# Patient Record
Sex: Male | Born: 1940 | Race: White | Hispanic: No | Marital: Married | Smoking: Former smoker
Health system: Southern US, Community
[De-identification: ages and names within clinical notes are randomized; demographics above are authoritative.]

## PROBLEM LIST (undated history)

## (undated) ENCOUNTER — Encounter: Attending: Ambulatory Care | Primary: Ambulatory Care

## (undated) ENCOUNTER — Telehealth: Attending: Ambulatory Care | Primary: Ambulatory Care

## (undated) ENCOUNTER — Encounter

## (undated) ENCOUNTER — Telehealth

## (undated) ENCOUNTER — Ambulatory Visit

## (undated) ENCOUNTER — Encounter: Attending: Internal Medicine | Primary: Internal Medicine

## (undated) ENCOUNTER — Encounter: Attending: Dermatology | Primary: Dermatology

## (undated) DIAGNOSIS — R972 Elevated prostate specific antigen [PSA]: Secondary | ICD-10-CM

## (undated) DIAGNOSIS — R079 Chest pain, unspecified: Secondary | ICD-10-CM

## (undated) DIAGNOSIS — Z95828 Presence of other vascular implants and grafts: Secondary | ICD-10-CM

## (undated) DIAGNOSIS — E119 Type 2 diabetes mellitus without complications: Secondary | ICD-10-CM

## (undated) DIAGNOSIS — R350 Frequency of micturition: Secondary | ICD-10-CM

## (undated) DIAGNOSIS — E790 Hyperuricemia without signs of inflammatory arthritis and tophaceous disease: Secondary | ICD-10-CM

## (undated) DIAGNOSIS — K5790 Diverticulosis of intestine, part unspecified, without perforation or abscess without bleeding: Secondary | ICD-10-CM

## (undated) DIAGNOSIS — K219 Gastro-esophageal reflux disease without esophagitis: Secondary | ICD-10-CM

## (undated) DIAGNOSIS — J449 Chronic obstructive pulmonary disease, unspecified: Secondary | ICD-10-CM

## (undated) DIAGNOSIS — I639 Cerebral infarction, unspecified: Secondary | ICD-10-CM

## (undated) DIAGNOSIS — L409 Psoriasis, unspecified: Secondary | ICD-10-CM

## (undated) DIAGNOSIS — I77810 Thoracic aortic ectasia: Secondary | ICD-10-CM

## (undated) DIAGNOSIS — J986 Disorders of diaphragm: Secondary | ICD-10-CM

## (undated) DIAGNOSIS — I6789 Other cerebrovascular disease: Secondary | ICD-10-CM

## (undated) DIAGNOSIS — I700 Atherosclerosis of aorta: Secondary | ICD-10-CM

## (undated) DIAGNOSIS — I10 Essential (primary) hypertension: Secondary | ICD-10-CM

## (undated) DIAGNOSIS — Z7960 Long term (current) use of unspecified immunomodulators and immunosuppressants: Secondary | ICD-10-CM

## (undated) DIAGNOSIS — K922 Gastrointestinal hemorrhage, unspecified: Secondary | ICD-10-CM

## (undated) DIAGNOSIS — I351 Nonrheumatic aortic (valve) insufficiency: Secondary | ICD-10-CM

## (undated) DIAGNOSIS — I5189 Other ill-defined heart diseases: Secondary | ICD-10-CM

## (undated) DIAGNOSIS — R339 Retention of urine, unspecified: Secondary | ICD-10-CM

## (undated) DIAGNOSIS — K55059 Acute (reversible) ischemia of intestine, part and extent unspecified: Secondary | ICD-10-CM

## (undated) DIAGNOSIS — E785 Hyperlipidemia, unspecified: Secondary | ICD-10-CM

## (undated) DIAGNOSIS — Z8619 Personal history of other infectious and parasitic diseases: Secondary | ICD-10-CM

## (undated) DIAGNOSIS — K2970 Gastritis, unspecified, without bleeding: Secondary | ICD-10-CM

## (undated) DIAGNOSIS — Z87898 Personal history of other specified conditions: Secondary | ICD-10-CM

## (undated) DIAGNOSIS — Z9889 Other specified postprocedural states: Secondary | ICD-10-CM

## (undated) DIAGNOSIS — N138 Other obstructive and reflux uropathy: Secondary | ICD-10-CM

## (undated) DIAGNOSIS — L4050 Arthropathic psoriasis, unspecified: Secondary | ICD-10-CM

## (undated) DIAGNOSIS — N401 Enlarged prostate with lower urinary tract symptoms: Secondary | ICD-10-CM

## (undated) DIAGNOSIS — H3530 Unspecified macular degeneration: Secondary | ICD-10-CM

## (undated) DIAGNOSIS — I272 Pulmonary hypertension, unspecified: Secondary | ICD-10-CM

## (undated) DIAGNOSIS — F32A Depression, unspecified: Secondary | ICD-10-CM

## (undated) DIAGNOSIS — K635 Polyp of colon: Secondary | ICD-10-CM

## (undated) DIAGNOSIS — M169 Osteoarthritis of hip, unspecified: Secondary | ICD-10-CM

## (undated) DIAGNOSIS — Z8546 Personal history of malignant neoplasm of prostate: Secondary | ICD-10-CM

## (undated) DIAGNOSIS — Z8719 Personal history of other diseases of the digestive system: Secondary | ICD-10-CM

## (undated) DIAGNOSIS — R739 Hyperglycemia, unspecified: Secondary | ICD-10-CM

## (undated) DIAGNOSIS — Z79899 Other long term (current) drug therapy: Secondary | ICD-10-CM

## (undated) DIAGNOSIS — D649 Anemia, unspecified: Secondary | ICD-10-CM

## (undated) DIAGNOSIS — N400 Enlarged prostate without lower urinary tract symptoms: Secondary | ICD-10-CM

## (undated) DIAGNOSIS — R0600 Dyspnea, unspecified: Secondary | ICD-10-CM

## (undated) DIAGNOSIS — R0609 Other forms of dyspnea: Secondary | ICD-10-CM

## (undated) DIAGNOSIS — M109 Gout, unspecified: Secondary | ICD-10-CM

## (undated) DIAGNOSIS — Z9841 Cataract extraction status, right eye: Secondary | ICD-10-CM

## (undated) DIAGNOSIS — G4700 Insomnia, unspecified: Secondary | ICD-10-CM

## (undated) DIAGNOSIS — M1612 Unilateral primary osteoarthritis, left hip: Secondary | ICD-10-CM

## (undated) DIAGNOSIS — E669 Obesity, unspecified: Secondary | ICD-10-CM

## (undated) HISTORY — DX: Chest pain, unspecified: R07.9

## (undated) HISTORY — DX: Hyperglycemia, unspecified: R73.9

## (undated) HISTORY — DX: Psoriasis, unspecified: L40.9

## (undated) HISTORY — DX: Essential (primary) hypertension: I10

## (undated) HISTORY — DX: Gastrointestinal hemorrhage, unspecified: K92.2

## (undated) HISTORY — DX: Hyperlipidemia, unspecified: E78.5

## (undated) HISTORY — PX: HERNIA REPAIR: SHX51

## (undated) HISTORY — DX: Gastro-esophageal reflux disease without esophagitis: K21.9

## (undated) HISTORY — DX: Gout, unspecified: M10.9

## (undated) HISTORY — DX: Personal history of other infectious and parasitic diseases: Z86.19

## (undated) HISTORY — DX: Obesity, unspecified: E66.9

## (undated) HISTORY — DX: Retention of urine, unspecified: R33.9

## (undated) HISTORY — DX: Frequency of micturition: R35.0

## (undated) HISTORY — DX: Personal history of other specified conditions: Z87.898

## (undated) HISTORY — PX: CATARACT EXTRACTION, BILATERAL: SHX1313

## (undated) HISTORY — DX: Other obstructive and reflux uropathy: N13.8

## (undated) HISTORY — DX: Anemia, unspecified: D64.9

## (undated) HISTORY — DX: Nonrheumatic aortic (valve) insufficiency: I35.1

## (undated) HISTORY — DX: Other obstructive and reflux uropathy: N40.1

## (undated) HISTORY — PX: BACK SURGERY: SHX140

## (undated) HISTORY — PX: EYE SURGERY: SHX253

## (undated) HISTORY — DX: Other long term (current) drug therapy: Z79.899

## (undated) HISTORY — DX: Hyperuricemia without signs of inflammatory arthritis and tophaceous disease: E79.0

## (undated) HISTORY — PX: COLONOSCOPY: SHX174

## (undated) HISTORY — PX: APPENDECTOMY: SHX54

## (undated) HISTORY — PX: SURGERY SCROTAL / TESTICULAR: SUR1316

## (undated) MED ORDER — MELATONIN 10 MG TABLET: ORAL | 0 days

---

## 1996-07-27 DIAGNOSIS — K56609 Unspecified intestinal obstruction, unspecified as to partial versus complete obstruction: Secondary | ICD-10-CM

## 1996-07-27 HISTORY — PX: PARTIAL COLECTOMY: SHX5273

## 1996-07-27 HISTORY — PX: COLECTOMY: SHX59

## 1996-07-27 HISTORY — DX: Unspecified intestinal obstruction, unspecified as to partial versus complete obstruction: K56.609

## 2005-03-18 ENCOUNTER — Ambulatory Visit: Payer: Self-pay | Admitting: Family Medicine

## 2005-03-26 ENCOUNTER — Ambulatory Visit (HOSPITAL_COMMUNITY): Admission: RE | Admit: 2005-03-26 | Discharge: 2005-03-26 | Payer: Self-pay | Admitting: Neurosurgery

## 2005-04-29 ENCOUNTER — Inpatient Hospital Stay (HOSPITAL_COMMUNITY): Admission: RE | Admit: 2005-04-29 | Discharge: 2005-04-30 | Payer: Self-pay | Admitting: Neurosurgery

## 2005-07-27 HISTORY — PX: CERVICAL SPINE SURGERY: SHX589

## 2005-11-12 ENCOUNTER — Ambulatory Visit: Payer: Self-pay | Admitting: General Surgery

## 2008-11-16 ENCOUNTER — Ambulatory Visit: Payer: Self-pay | Admitting: Family Medicine

## 2009-10-25 ENCOUNTER — Ambulatory Visit: Payer: Self-pay | Admitting: Internal Medicine

## 2009-11-20 ENCOUNTER — Inpatient Hospital Stay: Payer: Self-pay | Admitting: Internal Medicine

## 2009-11-24 ENCOUNTER — Ambulatory Visit: Payer: Self-pay | Admitting: Internal Medicine

## 2011-07-28 HISTORY — PX: PROSTATE BIOPSY: SHX241

## 2012-04-07 ENCOUNTER — Inpatient Hospital Stay: Payer: Self-pay | Admitting: Internal Medicine

## 2012-04-07 LAB — COMPREHENSIVE METABOLIC PANEL
Albumin: 3.5 g/dL (ref 3.4–5.0)
Alkaline Phosphatase: 42 U/L — ABNORMAL LOW (ref 50–136)
Anion Gap: 12 (ref 7–16)
BUN: 15 mg/dL (ref 7–18)
Bilirubin,Total: 0.2 mg/dL (ref 0.2–1.0)
Calcium, Total: 8.3 mg/dL — ABNORMAL LOW (ref 8.5–10.1)
Chloride: 101 mmol/L (ref 98–107)
Co2: 24 mmol/L (ref 21–32)
Creatinine: 0.96 mg/dL (ref 0.60–1.30)
EGFR (African American): >60
EGFR (Non-African Amer.): >60
Glucose: 141 mg/dL — ABNORMAL HIGH (ref 65–99)
Osmolality: 277 (ref 275–301)
Potassium: 3.1 mmol/L — ABNORMAL LOW (ref 3.5–5.1)
SGOT(AST): 18 U/L (ref 15–37)
SGPT (ALT): 14 U/L (ref 12–78)
Sodium: 137 mmol/L (ref 136–145)
Total Protein: 6.4 g/dL (ref 6.4–8.2)

## 2012-04-07 LAB — URINALYSIS, COMPLETE
Bacteria: NONE SEEN
Bilirubin,UR: NEGATIVE
Blood: NEGATIVE
Glucose,UR: NEGATIVE mg/dL (ref 0–75)
Hyaline Cast: 4
Ketone: NEGATIVE
Leukocyte Esterase: NEGATIVE
Nitrite: NEGATIVE
Ph: 5 (ref 4.5–8.0)
Protein: NEGATIVE
RBC,UR: <1 /HPF (ref 0–5)
Specific Gravity: 1.014 (ref 1.003–1.030)
Squamous Epithelial: NONE SEEN
WBC UR: 1 /HPF (ref 0–5)

## 2012-04-07 LAB — TROPONIN I: Troponin-I: <0.02 ng/mL

## 2012-04-07 LAB — CBC
HCT: 20.7 % — ABNORMAL LOW (ref 40.0–52.0)
HGB: 7.1 g/dL — ABNORMAL LOW (ref 13.0–18.0)
MCH: 32.9 pg (ref 26.0–34.0)
MCHC: 34.5 g/dL (ref 32.0–36.0)
MCV: 95 fL (ref 80–100)
Platelet: 221 10*3/uL (ref 150–440)
RBC: 2.17 10*6/uL — ABNORMAL LOW (ref 4.40–5.90)
RDW: 12.9 % (ref 11.5–14.5)
WBC: 6.5 10*3/uL (ref 3.8–10.6)

## 2012-04-07 LAB — CK TOTAL AND CKMB (NOT AT ARMC)
CK, Total: 81 U/L (ref 35–232)
CK-MB: 0.7 ng/mL (ref 0.5–3.6)

## 2012-04-07 LAB — APTT: Activated PTT: 25.8 secs (ref 23.6–35.9)

## 2012-04-07 LAB — LIPASE, BLOOD: Lipase: 137 U/L (ref 73–393)

## 2012-04-07 LAB — PROTIME-INR
INR: 1
Prothrombin Time: 13.3 secs (ref 11.5–14.7)

## 2012-04-07 LAB — MAGNESIUM: Magnesium: 1.9 mg/dL

## 2012-04-08 LAB — BASIC METABOLIC PANEL
Anion Gap: 7 (ref 7–16)
BUN: 9 mg/dL (ref 7–18)
Calcium, Total: 7.6 mg/dL — ABNORMAL LOW (ref 8.5–10.1)
Chloride: 107 mmol/L (ref 98–107)
Co2: 26 mmol/L (ref 21–32)
Creatinine: 0.84 mg/dL (ref 0.60–1.30)
EGFR (African American): >60
EGFR (Non-African Amer.): >60
Glucose: 114 mg/dL — ABNORMAL HIGH (ref 65–99)
Osmolality: 279 (ref 275–301)
Potassium: 3.2 mmol/L — ABNORMAL LOW (ref 3.5–5.1)
Sodium: 140 mmol/L (ref 136–145)

## 2012-04-08 LAB — CBC WITH DIFFERENTIAL/PLATELET
Basophil #: 0 10*3/uL (ref 0.0–0.1)
Basophil #: 0 10*3/uL (ref 0.0–0.1)
Basophil #: 0 10*3/uL (ref 0.0–0.1)
Basophil %: 0.5 %
Basophil %: 0.6 %
Basophil %: 0.3 %
Eosinophil #: 0 10*3/uL (ref 0.0–0.7)
Eosinophil #: 0 10*3/uL (ref 0.0–0.7)
Eosinophil #: 0 10*3/uL (ref 0.0–0.7)
Eosinophil %: 0.1 %
Eosinophil %: 0.6 %
Eosinophil %: 0.4 %
HCT: 22.1 % — ABNORMAL LOW (ref 40.0–52.0)
HCT: 21.1 % — ABNORMAL LOW (ref 40.0–52.0)
HCT: 20.3 % — ABNORMAL LOW (ref 40.0–52.0)
HGB: 7.8 g/dL — ABNORMAL LOW (ref 13.0–18.0)
HGB: 7.5 g/dL — ABNORMAL LOW (ref 13.0–18.0)
HGB: 7 g/dL — ABNORMAL LOW (ref 13.0–18.0)
Lymphocyte #: 1.3 10*3/uL (ref 1.0–3.6)
Lymphocyte #: 1.2 10*3/uL (ref 1.0–3.6)
Lymphocyte #: 1.5 10*3/uL (ref 1.0–3.6)
Lymphocyte %: 20.6 %
Lymphocyte %: 26 %
Lymphocyte %: 15 %
MCH: 32.3 pg (ref 26.0–34.0)
MCH: 32.5 pg (ref 26.0–34.0)
MCH: 32.4 pg (ref 26.0–34.0)
MCHC: 35.4 g/dL (ref 32.0–36.0)
MCHC: 35.4 g/dL (ref 32.0–36.0)
MCHC: 34.3 g/dL (ref 32.0–36.0)
MCV: 91 fL (ref 80–100)
MCV: 92 fL (ref 80–100)
MCV: 94 fL (ref 80–100)
Monocyte #: 0.5 x10 3/mm (ref 0.2–1.0)
Monocyte #: 0.4 x10 3/mm (ref 0.2–1.0)
Monocyte #: 0.7 x10 3/mm (ref 0.2–1.0)
Monocyte %: 7.6 %
Monocyte %: 9.5 %
Monocyte %: 6.9 %
Neutrophil #: 4.4 10*3/uL (ref 1.4–6.5)
Neutrophil #: 3 10*3/uL (ref 1.4–6.5)
Neutrophil #: 7.8 10*3/uL — ABNORMAL HIGH (ref 1.4–6.5)
Neutrophil %: 71.2 %
Neutrophil %: 63.3 %
Neutrophil %: 77.4 %
Platelet: 155 10*3/uL (ref 150–440)
Platelet: 153 10*3/uL (ref 150–440)
Platelet: 165 10*3/uL (ref 150–440)
RBC: 2.42 10*6/uL — ABNORMAL LOW (ref 4.40–5.90)
RBC: 2.3 10*6/uL — ABNORMAL LOW (ref 4.40–5.90)
RBC: 2.15 10*6/uL — ABNORMAL LOW (ref 4.40–5.90)
RDW: 14 % (ref 11.5–14.5)
RDW: 14.4 % (ref 11.5–14.5)
RDW: 14.9 % — ABNORMAL HIGH (ref 11.5–14.5)
WBC: 6.1 10*3/uL (ref 3.8–10.6)
WBC: 4.7 10*3/uL (ref 3.8–10.6)
WBC: 10 10*3/uL (ref 3.8–10.6)

## 2012-04-08 LAB — COMPREHENSIVE METABOLIC PANEL
Albumin: 2.8 g/dL — ABNORMAL LOW (ref 3.4–5.0)
Alkaline Phosphatase: 34 U/L — ABNORMAL LOW (ref 50–136)
Anion Gap: 11 (ref 7–16)
BUN: 11 mg/dL (ref 7–18)
Bilirubin,Total: 0.6 mg/dL (ref 0.2–1.0)
Calcium, Total: 7.6 mg/dL — ABNORMAL LOW (ref 8.5–10.1)
Chloride: 104 mmol/L (ref 98–107)
Co2: 23 mmol/L (ref 21–32)
Creatinine: 0.8 mg/dL (ref 0.60–1.30)
EGFR (African American): >60
EGFR (Non-African Amer.): >60
Glucose: 122 mg/dL — ABNORMAL HIGH (ref 65–99)
Osmolality: 276 (ref 275–301)
Potassium: 3 mmol/L — ABNORMAL LOW (ref 3.5–5.1)
SGOT(AST): 16 U/L (ref 15–37)
SGPT (ALT): 13 U/L (ref 12–78)
Sodium: 138 mmol/L (ref 136–145)
Total Protein: 5.2 g/dL — ABNORMAL LOW (ref 6.4–8.2)

## 2012-04-08 LAB — POTASSIUM: Potassium: 3.7 mmol/L (ref 3.5–5.1)

## 2012-04-09 LAB — CBC WITH DIFFERENTIAL/PLATELET
Basophil #: 0 10*3/uL (ref 0.0–0.1)
Basophil #: 0 10*3/uL (ref 0.0–0.1)
Basophil %: 0.2 %
Basophil %: 0.1 %
Eosinophil #: 0 10*3/uL (ref 0.0–0.7)
Eosinophil #: 0 10*3/uL (ref 0.0–0.7)
Eosinophil %: 0 %
Eosinophil %: 0 %
HCT: 23.2 % — ABNORMAL LOW (ref 40.0–52.0)
HCT: 21.4 % — ABNORMAL LOW (ref 40.0–52.0)
HGB: 8.1 g/dL — ABNORMAL LOW (ref 13.0–18.0)
HGB: 7.3 g/dL — ABNORMAL LOW (ref 13.0–18.0)
Lymphocyte #: 0.8 10*3/uL — ABNORMAL LOW (ref 1.0–3.6)
Lymphocyte #: 1.1 10*3/uL (ref 1.0–3.6)
Lymphocyte %: 8.9 %
Lymphocyte %: 12.9 %
MCH: 30.8 pg (ref 26.0–34.0)
MCH: 30.3 pg (ref 26.0–34.0)
MCHC: 34.9 g/dL (ref 32.0–36.0)
MCHC: 34.1 g/dL (ref 32.0–36.0)
MCV: 88 fL (ref 80–100)
MCV: 89 fL (ref 80–100)
Monocyte #: 0.6 x10 3/mm (ref 0.2–1.0)
Monocyte #: 0.8 x10 3/mm (ref 0.2–1.0)
Monocyte %: 7.2 %
Monocyte %: 9.5 %
Neutrophil #: 7.2 10*3/uL — ABNORMAL HIGH (ref 1.4–6.5)
Neutrophil #: 6.9 10*3/uL — ABNORMAL HIGH (ref 1.4–6.5)
Neutrophil %: 83.7 %
Neutrophil %: 77.5 %
Platelet: 126 10*3/uL — ABNORMAL LOW (ref 150–440)
Platelet: 122 10*3/uL — ABNORMAL LOW (ref 150–440)
RBC: 2.63 10*6/uL — ABNORMAL LOW (ref 4.40–5.90)
RBC: 2.4 10*6/uL — ABNORMAL LOW (ref 4.40–5.90)
RDW: 17.2 % — ABNORMAL HIGH (ref 11.5–14.5)
RDW: 17.6 % — ABNORMAL HIGH (ref 11.5–14.5)
WBC: 8.6 10*3/uL (ref 3.8–10.6)
WBC: 8.9 10*3/uL (ref 3.8–10.6)

## 2012-04-09 LAB — BASIC METABOLIC PANEL
Anion Gap: 12 (ref 7–16)
BUN: 12 mg/dL (ref 7–18)
Calcium, Total: 7.2 mg/dL — ABNORMAL LOW (ref 8.5–10.1)
Chloride: 111 mmol/L — ABNORMAL HIGH (ref 98–107)
Co2: 18 mmol/L — ABNORMAL LOW (ref 21–32)
Creatinine: 0.92 mg/dL (ref 0.60–1.30)
EGFR (African American): >60
EGFR (Non-African Amer.): >60
Glucose: 138 mg/dL — ABNORMAL HIGH (ref 65–99)
Osmolality: 283 (ref 275–301)
Potassium: 3.6 mmol/L (ref 3.5–5.1)
Sodium: 141 mmol/L (ref 136–145)

## 2012-04-09 LAB — HEMOGLOBIN
HGB: 6.7 g/dL — ABNORMAL LOW (ref 13.0–18.0)
HGB: 7.2 g/dL — ABNORMAL LOW (ref 13.0–18.0)

## 2012-04-09 LAB — MAGNESIUM: Magnesium: 1.6 mg/dL — ABNORMAL LOW

## 2012-04-10 LAB — CBC WITH DIFFERENTIAL/PLATELET
Basophil #: 0 10*3/uL (ref 0.0–0.1)
Basophil %: 0.3 %
Eosinophil #: 0.1 10*3/uL (ref 0.0–0.7)
Eosinophil %: 1.2 %
HCT: 19.8 % — ABNORMAL LOW (ref 40.0–52.0)
HGB: 6.9 g/dL — ABNORMAL LOW (ref 13.0–18.0)
Lymphocyte #: 0.7 10*3/uL — ABNORMAL LOW (ref 1.0–3.6)
Lymphocyte %: 10.5 %
MCH: 31.1 pg (ref 26.0–34.0)
MCHC: 35 g/dL (ref 32.0–36.0)
MCV: 89 fL (ref 80–100)
Monocyte #: 0.5 x10 3/mm (ref 0.2–1.0)
Monocyte %: 6.9 %
Neutrophil #: 5.6 10*3/uL (ref 1.4–6.5)
Neutrophil %: 81.1 %
Platelet: 130 10*3/uL — ABNORMAL LOW (ref 150–440)
RBC: 2.22 10*6/uL — ABNORMAL LOW (ref 4.40–5.90)
RDW: 16.3 % — ABNORMAL HIGH (ref 11.5–14.5)
WBC: 6.9 10*3/uL (ref 3.8–10.6)

## 2012-04-10 LAB — BASIC METABOLIC PANEL
Anion Gap: 8 (ref 7–16)
BUN: 9 mg/dL (ref 7–18)
Calcium, Total: 7.1 mg/dL — ABNORMAL LOW (ref 8.5–10.1)
Chloride: 109 mmol/L — ABNORMAL HIGH (ref 98–107)
Co2: 25 mmol/L (ref 21–32)
Creatinine: 0.89 mg/dL (ref 0.60–1.30)
EGFR (African American): >60
EGFR (Non-African Amer.): >60
Glucose: 114 mg/dL — ABNORMAL HIGH (ref 65–99)
Osmolality: 283 (ref 275–301)
Potassium: 3.2 mmol/L — ABNORMAL LOW (ref 3.5–5.1)
Sodium: 142 mmol/L (ref 136–145)

## 2012-04-10 LAB — HEMOGLOBIN
HGB: 8.8 g/dL — ABNORMAL LOW (ref 13.0–18.0)
HGB: 8.3 g/dL — ABNORMAL LOW (ref 13.0–18.0)

## 2012-04-10 LAB — POTASSIUM: Potassium: 3.7 mmol/L (ref 3.5–5.1)

## 2012-04-11 LAB — HEMOGLOBIN A1C: Hemoglobin A1C: 4.8 % (ref 4.2–6.3)

## 2012-04-11 LAB — CBC WITH DIFFERENTIAL/PLATELET
Basophil #: 0 10*3/uL (ref 0.0–0.1)
Basophil %: 0.2 %
Eosinophil #: 0.1 10*3/uL (ref 0.0–0.7)
Eosinophil %: 1.4 %
HCT: 25.7 % — ABNORMAL LOW (ref 40.0–52.0)
HGB: 9 g/dL — ABNORMAL LOW (ref 13.0–18.0)
Lymphocyte #: 0.9 10*3/uL — ABNORMAL LOW (ref 1.0–3.6)
Lymphocyte %: 13.3 %
MCH: 31.4 pg (ref 26.0–34.0)
MCHC: 35.3 g/dL (ref 32.0–36.0)
MCV: 89 fL (ref 80–100)
Monocyte #: 0.4 x10 3/mm (ref 0.2–1.0)
Monocyte %: 6.2 %
Neutrophil #: 5.3 10*3/uL (ref 1.4–6.5)
Neutrophil %: 78.9 %
Platelet: 149 10*3/uL — ABNORMAL LOW (ref 150–440)
RBC: 2.88 10*6/uL — ABNORMAL LOW (ref 4.40–5.90)
RDW: 16.1 % — ABNORMAL HIGH (ref 11.5–14.5)
WBC: 6.7 10*3/uL (ref 3.8–10.6)

## 2012-04-11 LAB — BASIC METABOLIC PANEL
Anion Gap: 8 (ref 7–16)
BUN: 4 mg/dL — ABNORMAL LOW (ref 7–18)
Calcium, Total: 7.5 mg/dL — ABNORMAL LOW (ref 8.5–10.1)
Chloride: 108 mmol/L — ABNORMAL HIGH (ref 98–107)
Co2: 26 mmol/L (ref 21–32)
Creatinine: 0.79 mg/dL (ref 0.60–1.30)
EGFR (African American): >60
EGFR (Non-African Amer.): >60
Glucose: 107 mg/dL — ABNORMAL HIGH (ref 65–99)
Osmolality: 280 (ref 275–301)
Potassium: 3.3 mmol/L — ABNORMAL LOW (ref 3.5–5.1)
Sodium: 142 mmol/L (ref 136–145)

## 2012-04-11 LAB — MAGNESIUM: Magnesium: 1.8 mg/dL

## 2012-04-15 DIAGNOSIS — K922 Gastrointestinal hemorrhage, unspecified: Secondary | ICD-10-CM | POA: Insufficient documentation

## 2012-07-11 ENCOUNTER — Ambulatory Visit: Payer: Self-pay | Admitting: Unknown Physician Specialty

## 2012-07-12 LAB — PATHOLOGY REPORT

## 2013-08-07 ENCOUNTER — Ambulatory Visit: Payer: Self-pay | Admitting: Ophthalmology

## 2013-08-07 LAB — POTASSIUM: Potassium: 3.5 mmol/L (ref 3.5–5.1)

## 2013-08-14 ENCOUNTER — Ambulatory Visit: Payer: Self-pay | Admitting: Ophthalmology

## 2013-09-05 ENCOUNTER — Ambulatory Visit: Payer: Self-pay | Admitting: Ophthalmology

## 2013-09-05 LAB — POTASSIUM: Potassium: 3.4 mmol/L — ABNORMAL LOW (ref 3.5–5.1)

## 2013-09-11 ENCOUNTER — Ambulatory Visit: Payer: Self-pay | Admitting: Ophthalmology

## 2013-12-01 LAB — CBC
HCT: 39.7 % — ABNORMAL LOW (ref 40.0–52.0)
HGB: 13.1 g/dL (ref 13.0–18.0)
MCH: 31.9 pg (ref 26.0–34.0)
MCHC: 32.9 g/dL (ref 32.0–36.0)
MCV: 97 fL (ref 80–100)
Platelet: 224 10*3/uL (ref 150–440)
RBC: 4.11 10*6/uL — ABNORMAL LOW (ref 4.40–5.90)
RDW: 13.9 % (ref 11.5–14.5)
WBC: 6.4 10*3/uL (ref 3.8–10.6)

## 2013-12-01 LAB — COMPREHENSIVE METABOLIC PANEL
Albumin: 3.6 g/dL (ref 3.4–5.0)
Alkaline Phosphatase: 64 U/L
Anion Gap: 7 (ref 7–16)
BUN: 15 mg/dL (ref 7–18)
Bilirubin,Total: 0.2 mg/dL (ref 0.2–1.0)
Calcium, Total: 8.9 mg/dL (ref 8.5–10.1)
Chloride: 105 mmol/L (ref 98–107)
Co2: 27 mmol/L (ref 21–32)
Creatinine: 1.04 mg/dL (ref 0.60–1.30)
EGFR (African American): >60
EGFR (Non-African Amer.): >60
Glucose: 165 mg/dL — ABNORMAL HIGH (ref 65–99)
Osmolality: 282 (ref 275–301)
Potassium: 3.4 mmol/L — ABNORMAL LOW (ref 3.5–5.1)
SGOT(AST): 25 U/L (ref 15–37)
SGPT (ALT): 21 U/L (ref 12–78)
Sodium: 139 mmol/L (ref 136–145)
Total Protein: 7.3 g/dL (ref 6.4–8.2)

## 2013-12-01 LAB — APTT: Activated PTT: 35.4 secs (ref 23.6–35.9)

## 2013-12-01 LAB — PROTIME-INR
INR: 1
Prothrombin Time: 13.4 secs (ref 11.5–14.7)

## 2013-12-01 LAB — LIPASE, BLOOD: Lipase: 188 U/L (ref 73–393)

## 2013-12-02 ENCOUNTER — Inpatient Hospital Stay: Payer: Self-pay | Admitting: Surgery

## 2013-12-02 LAB — CBC WITH DIFFERENTIAL/PLATELET
BASOS ABS: 0 10*3/uL (ref 0.0–0.1)
Basophil #: 0 10*3/uL (ref 0.0–0.1)
Basophil #: 0 10*3/uL (ref 0.0–0.1)
Basophil %: 0.5 %
Basophil %: 0.5 %
Basophil %: 0.5 %
Eosinophil #: 0.1 10*3/uL (ref 0.0–0.7)
Eosinophil #: 0.1 10*3/uL (ref 0.0–0.7)
Eosinophil #: 0.1 10*3/uL (ref 0.0–0.7)
Eosinophil %: 1.1 %
Eosinophil %: 1.8 %
Eosinophil %: 2.1 %
HCT: 37.3 % — ABNORMAL LOW (ref 40.0–52.0)
HCT: 32.4 % — ABNORMAL LOW (ref 40.0–52.0)
HCT: 32 % — ABNORMAL LOW (ref 40.0–52.0)
HGB: 12.1 g/dL — ABNORMAL LOW (ref 13.0–18.0)
HGB: 10.6 g/dL — ABNORMAL LOW (ref 13.0–18.0)
HGB: 10.5 g/dL — ABNORMAL LOW (ref 13.0–18.0)
LYMPHS ABS: 1 10*3/uL (ref 1.0–3.6)
Lymphocyte #: 1.1 10*3/uL (ref 1.0–3.6)
Lymphocyte #: 1 10*3/uL (ref 1.0–3.6)
Lymphocyte %: 15.7 %
Lymphocyte %: 22.7 %
Lymphocyte %: 18.2 %
MCH: 30.7 pg (ref 26.0–34.0)
MCH: 30.9 pg (ref 26.0–34.0)
MCH: 30.7 pg (ref 26.0–34.0)
MCHC: 32.4 g/dL (ref 32.0–36.0)
MCHC: 32.6 g/dL (ref 32.0–36.0)
MCHC: 32.8 g/dL (ref 32.0–36.0)
MCV: 95 fL (ref 80–100)
MCV: 95 fL (ref 80–100)
MCV: 94 fL (ref 80–100)
Monocyte #: 0.4 x10 3/mm (ref 0.2–1.0)
Monocyte #: 0.3 x10 3/mm (ref 0.2–1.0)
Monocyte #: 0.5 x10 3/mm (ref 0.2–1.0)
Monocyte %: 6.4 %
Monocyte %: 7.6 %
Monocyte %: 9 %
NEUTROS ABS: 3.7 10*3/uL (ref 1.4–6.5)
Neutrophil #: 5.3 10*3/uL (ref 1.4–6.5)
Neutrophil #: 2.8 10*3/uL (ref 1.4–6.5)
Neutrophil %: 76.3 %
Neutrophil %: 67.4 %
Neutrophil %: 70.2 %
PLATELETS: 159 10*3/uL (ref 150–440)
Platelet: 164 10*3/uL (ref 150–440)
Platelet: 145 10*3/uL — ABNORMAL LOW (ref 150–440)
RBC: 3.94 10*6/uL — ABNORMAL LOW (ref 4.40–5.90)
RBC: 3.43 10*6/uL — ABNORMAL LOW (ref 4.40–5.90)
RBC: 3.43 10*6/uL — AB (ref 4.40–5.90)
RDW: 15.4 % — ABNORMAL HIGH (ref 11.5–14.5)
RDW: 15.6 % — ABNORMAL HIGH (ref 11.5–14.5)
RDW: 15.6 % — ABNORMAL HIGH (ref 11.5–14.5)
WBC: 7 10*3/uL (ref 3.8–10.6)
WBC: 4.2 10*3/uL (ref 3.8–10.6)
WBC: 5.3 10*3/uL (ref 3.8–10.6)

## 2013-12-02 LAB — BASIC METABOLIC PANEL
Anion Gap: 3 — ABNORMAL LOW (ref 7–16)
BUN: 12 mg/dL (ref 7–18)
Calcium, Total: 7.9 mg/dL — ABNORMAL LOW (ref 8.5–10.1)
Chloride: 110 mmol/L — ABNORMAL HIGH (ref 98–107)
Co2: 28 mmol/L (ref 21–32)
Creatinine: 0.88 mg/dL (ref 0.60–1.30)
EGFR (African American): >60
EGFR (Non-African Amer.): >60
Glucose: 172 mg/dL — ABNORMAL HIGH (ref 65–99)
Osmolality: 285 (ref 275–301)
Potassium: 4 mmol/L (ref 3.5–5.1)
Sodium: 141 mmol/L (ref 136–145)

## 2013-12-02 LAB — HEMOGLOBIN: HGB: 9.4 g/dL — ABNORMAL LOW (ref 13.0–18.0)

## 2013-12-03 LAB — CBC WITH DIFFERENTIAL/PLATELET
BASOS ABS: 0 10*3/uL (ref 0.0–0.1)
BASOS PCT: 0.4 %
Basophil #: 0 10*3/uL (ref 0.0–0.1)
Basophil %: 0.4 %
EOS ABS: 0.1 10*3/uL (ref 0.0–0.7)
EOS ABS: 0.1 10*3/uL (ref 0.0–0.7)
EOS PCT: 1.7 %
Eosinophil %: 2.6 %
HCT: 24.4 % — ABNORMAL LOW (ref 40.0–52.0)
HCT: 22.5 % — AB (ref 40.0–52.0)
HGB: 8.2 g/dL — AB (ref 13.0–18.0)
HGB: 7.5 g/dL — AB (ref 13.0–18.0)
LYMPHS PCT: 24.3 %
Lymphocyte #: 1.3 10*3/uL (ref 1.0–3.6)
Lymphocyte #: 1.2 10*3/uL (ref 1.0–3.6)
Lymphocyte %: 22.3 %
MCH: 31.6 pg (ref 26.0–34.0)
MCH: 31.5 pg (ref 26.0–34.0)
MCHC: 33.5 g/dL (ref 32.0–36.0)
MCHC: 33.2 g/dL (ref 32.0–36.0)
MCV: 94 fL (ref 80–100)
MCV: 95 fL (ref 80–100)
MONO ABS: 0.5 x10 3/mm (ref 0.2–1.0)
MONOS PCT: 8.9 %
Monocyte #: 0.4 x10 3/mm (ref 0.2–1.0)
Monocyte %: 7.3 %
NEUTROS ABS: 3.6 10*3/uL (ref 1.4–6.5)
Neutrophil #: 3.3 10*3/uL (ref 1.4–6.5)
Neutrophil %: 64.7 %
Neutrophil %: 67.4 %
PLATELETS: 162 10*3/uL (ref 150–440)
PLATELETS: 159 10*3/uL (ref 150–440)
RBC: 2.59 10*6/uL — ABNORMAL LOW (ref 4.40–5.90)
RBC: 2.37 10*6/uL — ABNORMAL LOW (ref 4.40–5.90)
RDW: 15.2 % — ABNORMAL HIGH (ref 11.5–14.5)
RDW: 15.1 % — ABNORMAL HIGH (ref 11.5–14.5)
WBC: 5.2 10*3/uL (ref 3.8–10.6)
WBC: 5.4 10*3/uL (ref 3.8–10.6)

## 2013-12-03 LAB — HEMOGLOBIN: HGB: 8.6 g/dL — ABNORMAL LOW (ref 13.0–18.0)

## 2013-12-04 LAB — CBC WITH DIFFERENTIAL/PLATELET
BASOS PCT: 0.5 %
Basophil #: 0 10*3/uL (ref 0.0–0.1)
EOS PCT: 3.5 %
Eosinophil #: 0.2 10*3/uL (ref 0.0–0.7)
HCT: 16.1 % — AB (ref 40.0–52.0)
HGB: 5.5 g/dL — ABNORMAL LOW (ref 13.0–18.0)
Lymphocyte #: 1.2 10*3/uL (ref 1.0–3.6)
Lymphocyte %: 25.1 %
MCH: 32 pg (ref 26.0–34.0)
MCHC: 34 g/dL (ref 32.0–36.0)
MCV: 94 fL (ref 80–100)
Monocyte #: 0.5 x10 3/mm (ref 0.2–1.0)
Monocyte %: 9.8 %
NEUTROS ABS: 2.9 10*3/uL (ref 1.4–6.5)
Neutrophil %: 61.1 %
PLATELETS: 146 10*3/uL — AB (ref 150–440)
RBC: 1.71 10*6/uL — AB (ref 4.40–5.90)
RDW: 15.3 % — AB (ref 11.5–14.5)
WBC: 4.8 10*3/uL (ref 3.8–10.6)

## 2013-12-05 LAB — CBC WITH DIFFERENTIAL/PLATELET
Basophil #: 0 10*3/uL (ref 0.0–0.1)
Basophil %: 0.3 %
EOS ABS: 0.2 10*3/uL (ref 0.0–0.7)
EOS PCT: 4.4 %
HCT: 20.8 % — AB (ref 40.0–52.0)
HGB: 7.2 g/dL — ABNORMAL LOW (ref 13.0–18.0)
LYMPHS ABS: 1 10*3/uL (ref 1.0–3.6)
Lymphocyte %: 17.5 %
MCH: 31.3 pg (ref 26.0–34.0)
MCHC: 34.5 g/dL (ref 32.0–36.0)
MCV: 91 fL (ref 80–100)
Monocyte #: 0.5 x10 3/mm (ref 0.2–1.0)
Monocyte %: 8.6 %
Neutrophil #: 3.9 10*3/uL (ref 1.4–6.5)
Neutrophil %: 69.2 %
PLATELETS: 149 10*3/uL — AB (ref 150–440)
RBC: 2.29 10*6/uL — ABNORMAL LOW (ref 4.40–5.90)
RDW: 15.9 % — AB (ref 11.5–14.5)
WBC: 5.7 10*3/uL (ref 3.8–10.6)

## 2013-12-05 LAB — HEMOGLOBIN: HGB: 7.6 g/dL — ABNORMAL LOW (ref 13.0–18.0)

## 2013-12-06 LAB — CBC WITH DIFFERENTIAL/PLATELET
BASOS ABS: 0 10*3/uL (ref 0.0–0.1)
BASOS PCT: 0.3 %
EOS PCT: 4.9 %
Eosinophil #: 0.3 10*3/uL (ref 0.0–0.7)
HCT: 21.6 % — ABNORMAL LOW (ref 40.0–52.0)
HGB: 7.3 g/dL — AB (ref 13.0–18.0)
LYMPHS PCT: 19.6 %
Lymphocyte #: 1.1 10*3/uL (ref 1.0–3.6)
MCH: 31.3 pg (ref 26.0–34.0)
MCHC: 33.9 g/dL (ref 32.0–36.0)
MCV: 92 fL (ref 80–100)
Monocyte #: 0.5 x10 3/mm (ref 0.2–1.0)
Monocyte %: 9.4 %
Neutrophil #: 3.8 10*3/uL (ref 1.4–6.5)
Neutrophil %: 65.8 %
Platelet: 188 10*3/uL (ref 150–440)
RBC: 2.34 10*6/uL — AB (ref 4.40–5.90)
RDW: 15.8 % — ABNORMAL HIGH (ref 11.5–14.5)
WBC: 5.8 10*3/uL (ref 3.8–10.6)

## 2013-12-08 LAB — PATHOLOGY REPORT

## 2014-01-02 DIAGNOSIS — N400 Enlarged prostate without lower urinary tract symptoms: Secondary | ICD-10-CM | POA: Insufficient documentation

## 2014-01-02 DIAGNOSIS — E790 Hyperuricemia without signs of inflammatory arthritis and tophaceous disease: Secondary | ICD-10-CM | POA: Insufficient documentation

## 2014-01-02 DIAGNOSIS — D649 Anemia, unspecified: Secondary | ICD-10-CM | POA: Insufficient documentation

## 2014-01-02 DIAGNOSIS — R972 Elevated prostate specific antigen [PSA]: Secondary | ICD-10-CM | POA: Insufficient documentation

## 2014-05-25 ENCOUNTER — Ambulatory Visit: Payer: Self-pay

## 2014-07-16 ENCOUNTER — Ambulatory Visit: Payer: Self-pay | Admitting: Specialist

## 2014-11-13 NOTE — Consult Note (Signed)
[place]:VJKQA GI bleed. Pt not bleeding any since admission to CCU.  and hgb up to 7.8 after 2 units.  He had a bleeding scan last night but results pending.  Chest clear, heart RRR, abd soft and non tender.  Likely can go to floor.  Repeat hgb or CBC this afternoon and if below current level transfuse one more unit.  Likely LGI bleed and likely diverticulosis.  Dr. Dionne Milo on over weekend and will see both days.  Can start clear liquids tomorrow morning, water today and ice chips.  Electronic Signatures: [name] (MD)  (Signed on 13-Sep-13 08:26)  Authored  Last Updated: 13-Sep-13 08:26 by [name] (MD)

## 2014-11-13 NOTE — H&P (Signed)
PATIENT NAME:  Brian Callahan, Brian Callahan MR#:  [id number] DATE OF BIRTH:  12/22/1940  DATE OF ADMISSION:  04/07/2012  PRIMARY CARE PHYSICIAN: Gayland Curry, MD  ER PHYSICIAN: Arman Filter, MD  CHIEF COMPLAINT: Rectal bleeding.   HISTORY OF PRESENT ILLNESS: This is a 74 year old male patient who came to the ER because he is having bright red blood from the rectum since Sunday. The patient noticed fresh blood coming out since Sunday, on and off. The patient had about 4 to 5 episodes of bright red blood from the rectum today and then he came to the ER. In the ER, he was found to have a hemoglobin of 7.1. We were asked to admit the patient for acute GI bleed. The patient complains of nausea and some dizziness but no chest pain and no abdominal pain.  PAST MEDICAL HISTORY:  1. Hypertension. 2. Hyperlipidemia.   ALLERGIES: No known allergies.   MEDICATIONS:  1. Aspirin 81 mg daily. 2. Enalapril 10 mg daily. 3. HCTZ/metoprolol 25/50 mg one tablet daily.  4. Multivitamin 1 tablet daily.  5. Zetia 10 mg daily.   SOCIAL HISTORY: The patient does not smoke. 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 No drugs.   PAST SURGICAL HISTORY: 1. Bowel resection for diverticulitis. 2. Appendectomy.  3. Inguinal hernia repair. 4. Cervical fusion.  FAMILY HISTORY: Father and brothers have diabetes.  REVIEW OF SYSTEMS: CONSTITUTIONAL: The patient has no fever but has fatigue and pale appearance. EYES: No blurred vision. ENT: No tinnitus. No epistaxis. No difficulty swallowing. RESPIRATORY: No cough. No wheezing. CARDIOVASCULAR: No chest pain. No palpitations. GI: Has nausea and rectal bleeding. GENITOURINARY: No dysuria. ENDOCRINE: No polyuria or nocturia. INTEGUMENTARY: No skin rashes. MUSCULOSKELETAL: No joint pain. NEUROLOGIC: No numbness or weakness. PSYCH: No anxiety or insomnia.   PHYSICAL EXAMINATION:   VITALS: Temperature 99, pulse 102, respirations 18 to 20, and blood pressure initially  96/51 and repeat 130/58.  GENERAL: Alert, awake and oriented, very pale appearing male, not in distress.   HEAD/EYES: Head atraumatic, normocephalic. Pupils are equally reacting to light. Extraocular movements are intact.   ENT: No tympanic membrane congestion. No hypertrophy. No oropharyngeal erythema.   NECK: Thyroid not enlarged. No lymphadenopathy. No carotid bruit.   CARDIOVASCULAR: S1 and S2 regular. No murmurs.   LUNGS: Clear to auscultation. No wheeze. No rales.   ABDOMEN: Scar in the midabdomen. Bowel sounds present. No tenderness. No hernias.  MUSCULOSKELETAL: Strength 5 out of 5 in upper and lower extremities. Sensation intact.   SKIN: Pale.   NEUROLOGIC: Oriented to time, place, and person. No focal neurological deficits.   LABS/STUDIES: Electrolytes: Sodium 137, potassium 3.1, chloride 101, bicarbonate 24, BUN 15, creatinine 0.96, and glucose 141.   White count 6.5, hemoglobin 7.1, hematocrit 20.7, and platelets 221.   EKG shows normal sinus with PVCs, 94 beats, no ST-T changes.   ASSESSMENT AND PLAN:  1. The patient is a 74 year old male with rectal bleeding, most likely lower, diverticular versus polyp bleeding. Hemoglobin is 7.1. Recommend blood transfusion. I spoke with Dr. Vira Agar. The patient is scheduled for bleeding scan. The patient is refusing blood transfusion but told that he has been having active bleeding and hemoglobin is low and transfusion is indicated. We will continue IV fluids, admit to the Intensive Care Unit, transfuse, and check CBC every six hours. Hold on aspirin and other anticoagulants.  Obtain gastroenterology consultation, Dr. Vira Agar notified. Continue n.p.o., IV fluids, and IV PPIs. After the bleeding scan, if  it is positive, we will have vascular surgery see the patient.  2. Hypertension. Blood pressure is borderline. We will hold the blood pressure medications.   I discussed the plan with the patient and the patient's wife at bedside.    TIME SPENT: About 60 minutes on critical history and physical.  ____________________________ Epifanio Lesches, MD sk:slb D: 04/07/2012 21:44:58 ET T: 04/08/2012 07:42:59 ET JOB#: [id number]  cc: Epifanio Lesches, MD, <Dictator> Floria Raveling. Astrid Divine, MD Epifanio Lesches MD ELECTRONICALLY SIGNED 04/27/2012 22:34

## 2014-11-13 NOTE — Consult Note (Signed)
General Aspect GI Bleed    Present Illness The patient is a 74 year old male patient who came to the ER because he is having bright red blood from the rectum since last Sunday. The patient had about 4 to 5 episodes of bright red blood from the rectum today and then he came to the ER. In the ER, he was found to have a hemoglobin of 7.1. The bleeding is painless.  The patient complains of nausea and some dizziness but no chest pain and no abdominal pain.  Initially he was on the floor but then had a massive bloody movement and became hypotensive.   He has been transfered to the ICU and I am asked to evaluate for possible intervention.  PAST MEDICAL HISTORY:  1. Hypertension. 2. Hyperlipidemia.   Home Medications: Medication Instructions Status  aspirin 81 mg oral tablet 1 tab(s) orally once a day Active  enalapril 10 mg oral tablet 1 tab(s) orally once a day Active  hydrochlorothiazide-metoprolol 25 mg-50 mg oral tablet 1 tab(s) orally once a day Active  multivitamin 1 tab(s) orally once a day Active  Zetia 10 mg oral tablet 1 tab(s) orally once a day Active    No Known Allergies:   Case History:   Family History Non-Contributory    Social History negative tobacco, negative ETOH, negative Illicit drugs   Review of Systems:   Fever/Chills No    Cough No    Sputum No    Abdominal Pain No    Diarrhea Yes  bloody    Constipation No    Nausea/Vomiting No    SOB/DOE No    Chest Pain No    Telemetry Reviewed NSR    Dysuria No    Tolerating Diet No   Physical Exam:   GEN well developed, critically ill appearing    HEENT pale conjunctivae, PERRL, dry oral mucosa    NECK supple  trachea midline    RESP normal resp effort  no use of accessory muscles    CARD regular rate  no JVD    ABD denies tenderness  soft  nondistended    EXTR negative cyanosis/clubbing, negative edema    SKIN normal to palpation, No rashes, No ulcers    NEURO cranial nerves intact,  follows commands, motor/sensory function intact    PSYCH alert, A+O to time, place, person, good insight   Nursing/Ancillary Notes: **Vital Signs.:   13-Sep-13 15:05   Vital Signs Type Routine   Temperature Temperature (F) 98.3   Celsius 36.8   Temperature Source oral   Pulse Pulse 107   Pulse source if not from Vital Sign Device per cardiac monitor   Respirations Respirations 12   Systolic BP Systolic BP 102   Diastolic BP (mmHg) Diastolic BP (mmHg) 63   Mean BP 77   BP Source  if not from Vital Sign Device non-invasive   Pulse Ox % Pulse Ox % 97   Oxygen Delivery Room Air/ 21 %   Hepatic:  13-Sep-13 01:53    Bilirubin, Total 0.6   Alkaline Phosphatase  34   SGPT (ALT) 13   SGOT (AST) 16   Total Protein, Serum  5.2   Albumin, Serum  2.8  Routine Chem:  13-Sep-13 01:53    Potassium, Serum  3.0   Glucose, Serum  122   BUN 11   Creatinine (comp) 0.80   Sodium, Serum 138   Chloride, Serum 104   CO2, Serum 23   Calcium (Total),  Serum  7.6   Anion Gap 11   Osmolality (calc) 276   eGFR (African American) >60   eGFR (Non-African American) >60 (eGFR values <36m/min/1.73 m2 may be an indication of chronic kidney disease (CKD). Calculated eGFR is useful in patients with stable renal function. The eGFR calculation will not be reliable in acutely ill patients when serum creatinine is changing rapidly. It is not useful in  patients on dialysis. The eGFR calculation may not be applicable to patients at the low and high extremes of body sizes, pregnant women, and vegetarians.)    08:09    Potassium, Serum  3.2   Glucose, Serum  114   BUN 9   Creatinine (comp) 0.84   Sodium, Serum 140   Chloride, Serum 107   CO2, Serum 26   Calcium (Total), Serum  7.6   Anion Gap 7   Osmolality (calc) 279   eGFR (African American) >60   eGFR (Non-African American) >60 (eGFR values <687mmin/1.73 m2 may be an indication of chronic kidney disease (CKD). Calculated eGFR is useful in  patients with stable renal function. The eGFR calculation will not be reliable in acutely ill patients when serum creatinine is changing rapidly. It is not useful in  patients on dialysis. The eGFR calculation may not be applicable to patients at the low and high extremes of body sizes, pregnant women, and vegetarians.)    14:02    Potassium, Serum 3.7 (Result(s) reported on 08 Apr 2012 at 02:26PM.)  Routine Hem:  13-Sep-13 01:53    Hemoglobin (CBC)  7.8   WBC (CBC) 6.1   RBC (CBC)  2.42   Hematocrit (CBC)  22.1   Platelet Count (CBC) 155   MCV 91   MCH 32.3   MCHC 35.4   RDW 14.0   Neutrophil % 71.2   Lymphocyte % 20.6   Monocyte % 7.6   Eosinophil % 0.1   Basophil % 0.5   Neutrophil # 4.4   Lymphocyte # 1.3   Monocyte # 0.5   Eosinophil # 0.0   Basophil # 0.0 (Result(s) reported on 08 Apr 2012 at 03:32AM.)    08:09    Hemoglobin (CBC)  7.5   WBC (CBC) 4.7   RBC (CBC)  2.30   Hematocrit (CBC)  21.1   Platelet Count (CBC) 153   MCV 92   MCH 32.5   MCHC 35.4   RDW 14.4   Neutrophil % 63.3   Lymphocyte % 26.0   Monocyte % 9.5   Eosinophil % 0.6   Basophil % 0.6   Neutrophil # 3.0   Lymphocyte # 1.2   Monocyte # 0.4   Eosinophil # 0.0   Basophil # 0.0 (Result(s) reported on 08 Apr 2012 at 08:29AM.)    14:02    Hemoglobin (CBC)  7.0   WBC (CBC) 10.0   RBC (CBC)  2.15   Hematocrit (CBC)  20.3   Platelet Count (CBC) 165   MCV 94   MCH 32.4   MCHC 34.3   RDW  14.9   Neutrophil % 77.4   Lymphocyte % 15.0   Monocyte % 6.9   Eosinophil % 0.4   Basophil % 0.3   Neutrophil #  7.8   Lymphocyte # 1.5   Monocyte # 0.7   Eosinophil # 0.0   Basophil # 0.0 (Result(s) reported on 08 Apr 2012 at 02:23PM.)    22:58    Hemoglobin (CBC)  8.1   WBC (CBC) 8.6  RBC (CBC)  2.63   Hematocrit (CBC)  23.2   Platelet Count (CBC)  126   MCV 88   MCH 30.8   MCHC 34.9   RDW  17.2   Neutrophil % 83.7   Lymphocyte % 8.9   Monocyte % 7.2   Eosinophil % 0.0   Basophil %  0.2   Neutrophil #  7.2   Lymphocyte #  0.8   Monocyte # 0.6   Eosinophil # 0.0   Basophil # 0.0 (Result(s) reported on 09 Apr 2012 at 12:04AM.)     Impression 1.  Acute GI bleed           continue to rescusitate with blood           given the massive extent of the hemorrhage I will proceed with angiography and embolization           risks and benefits discussed patient agrees to proceed           maintain type and cross for 2 units at all times 2.  Hypertension            given current status hold antihypertensive medications 3.  Hyperlipidemia             will continue statin when taking po 4.  Cervical DJD              avoid NSAIDs 5.  GERD              give PPI IV    Plan level 5 consult   Electronic Signatures: Hortencia Pilar (MD)  (Signed 15-Sep-13 16:15)  Authored: General Aspect/Present Illness, Home Medications, Allergies, History and Physical Exam, Vital Signs, Labs, Impression/Plan   Last Updated: 15-Sep-13 16:15 by Hortencia Pilar (MD)

## 2014-11-13 NOTE — Consult Note (Signed)
PATIENT NAME:  Brian Callahan, MORRICAL MR#:  [id number] DATE OF BIRTH:  1941-02-07  DATE OF CONSULTATION:  04/07/2012  REFERRING PHYSICIAN:   CONSULTING PHYSICIAN:  [name], MD  HISTORY OF PRESENT ILLNESS: The patient is a 74 year old white male who was admitted for GI bleeding. He started bleeding Sunday, today is Thursday. He has had bleeding off and on since then. Today was the worst. He has had about 4 or 5 bloody passages. Initially it was somewhat dark, but most of the other times it has been red blood and was red blood today. He did have nausea and he vomited once. There was some yellowish liquid. He thought he smelled blood, but there was no blood in the vomitus.   PAST SURGICAL HISTORY:  1. Colon resection in the early 1990s for blockage, possibly a volvulus, possibly diverticular disease. 2. Cervical neck fusion three years ago.  3. Hernia surgeries. 4. Previous catheterization that was negative.  5. 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   PAST MEDICAL HISTORY:  1. Hypertension.  2. Hypertension.  3. Psoriasis treated with creams.  MEDICATIONS: Metoprolol and lisinopril.  REVIEW OF SYSTEMS: He denies any abdominal pain, denies any chest pain. No skipping or irregular heartbeats. No hematemesis. No asthma, wheezing, emphysema, chronic cough, or shortness of breath. No headaches. No dysuria or hematuria. He does have a history of hypertension.   HABITS: He quit smoking 20 years ago, smoked maybe a pack a week. Alcohol - he drinks 5 or 6 drinks a day, varies between hard liquor and beer.      PHYSICAL EXAMINATION:   GENERAL: A 74 year old white male who appears his stated.  HEENT: Sclerae anicteric. Conjunctivae pale. Tongue is pale   HEAD: Atraumatic. Trachea is in the midline.   CHEST: Clear.   HEART: No murmurs or gallops I can hear.   ABDOMEN: Old scars from previous surgery. Some faintly reddish splotches consistent with psoriasis.  No hepatosplenomegaly. No masses. No bruits. No significant tenderness.   EXTREMITIES: No edema.   SKIN: Warm and dry.   RECTAL: Performed by the ER physician.   EXTREMITIES: No edema.   SKIN: Warm and dry.   PSYCH: Mood and affect are alert, oriented. The patient is somewhat hard of hearing.  LABS/STUDIES: Glucose 141, BUN 15, creatinine 0.96, sodium 137, potassium 3.1, chloride 101, CO2 24, and calcium 8.3. Magnesium 1.9. Lipase 137. Total protein 6.4, albumin 3.5, total bilirubin 0.2, alkaline phosphatase 42, SGOT 18, and SGPT 14. CPK, MB, and troponins are negative. White count 6.5, hemoglobin 7.1, and platelet count 221. O-negative blood with negative antibody screen. Pro time 13.3. INR 1.   ASSESSMENT: Lower GI bleeding off and on for several days, probably diverticular bleeding would be the most likely, cannot rule out neoplasm.     RECOMMENDATIONS:  1. I recommended a blood transfusion. The patient refuses at this time. He did say that if his bleeding scan was positive that he would accept a transfusion, but at this time despite my recommendation and the hospitalist recommendation he refuses blood transfusion. 2. I doubt upper GI source, but to cover the possibility would recommend 40 mg of Protonix IV twice a day.  3. Stat GI bleeding scan. If this is positive, recommend surgical consult.  4. Given his history of alcohol would consider close observation, may need to be put on CIWA Protocol. I will follow with you. ____________________________ [name], MD rte:slb D: 04/07/2012 26:83:41 ET T: 04/08/2012  06:07:33 ET JOB#: [id number]  cc: [name], MD, <Dictator> Floria Raveling. Astrid Divine, MD [name] MD ELECTRONICALLY SIGNED 04/11/2012 16:08

## 2014-11-13 NOTE — Consult Note (Signed)
Pt IO:XBDZH GI bleeding.  His repeat bleeding scan was positive throughout the colon, indicating that the location is likely right colon/hepatic flexure.  He continues to pass blood.  Getting ready to have his fourth unit this evening.  Surgeon suggested platelets since he was on asprin and his platelets may not function as well as needed.  Dieulafoy, diverticular, AVM, neoplasm all possible.  Dr. Delana Meyer contacted and plans angiography procedure tonight.  Hospitalist surgeon on case concerned if surgery needed without localization that the patient will need a total colectomy. Hgb 7, down from admission despite 2 units given last night.  His color looks better than when he was moved to the unit.  Check CBC after this next unit.  Discussed with patient.  Electronic Signatures: [name] (MD)  (Signed on 13-Sep-13 17:55)  Authored  Last Updated: 13-Sep-13 17:55 by [name] (MD)

## 2014-11-13 NOTE — Discharge Summary (Signed)
PATIENT NAME:  Brian Callahan, Brian Callahan MR#:  [id number] DATE OF BIRTH:  1941-05-15  DATE OF ADMISSION:  04/07/2012 DATE OF DISCHARGE:  04/11/2012  PRIMARY CARE PHYSICIAN: Gayland Curry, MD    CONSULTATIONS:  1. GI, Dr. Vira Agar. 2. General Surgery, Dr. Rexene Edison. 3. Vascular Surgery, Dr. Delana Meyer.   DISCHARGE DIAGNOSES:  1. Lower gastrointestinal bleeding possibly due to diverticulosis.  2. Anemia.   3. Hypertension.  4. Hyperlipidemia.   PROCEDURE: Mesenteric embolization.   CONDITION: Stable.   CODE STATUS:  FULL CODE.     HOME MEDICATIONS:  1. Enalapril 10 mg p.o. daily.  2. HCTZ/metoprolol 25 mg/50 mg p.o. once daily.  3. Multivitamin day 1 tablet p.o. daily.  4. Zetia 10 mg p.o. daily.  5. Protonix 40 mg p.o. b.i.d.   NOTE: Stop aspirin 81 mg p.o. daily.   DIET: Low sodium diet.   ACTIVITY: As tolerated.   FOLLOW-UP CARE:   1. Follow up with PCP within 1 week.  2. Follow up with GI, Dr. Vira Agar, within 1 to 2 weeks.   REASON FOR ADMISSION: Rectal bleeding.   HOSPITAL COURSE: The patient is a 74 year old gentleman with a history of hypertension, hyperlipidemia, presented to the Emergency Department with bright red blood from rectum. The patient had 4 to 5 episodes of bright red blood from rectum and then came to the ED for further evaluation. The patient's hemoglobin was 7.1 in the ED, and he was admitted for acute gastrointestinal bleeding. For a detailed History and Physical examination, please refer to the admission note dictated by Dr. Vianne Bulls. The patient was admitted to the Critical Care Unit and recently the patient got a blood transfusion, 2 units. Aspirin was on hold. In addition, the patient got IV fluid support, IV PPI. Hemoglobin increased to 7.8 after 2 units of PRBC transfusion. Dr. Vira Agar evaluated the patient and suggested a bleeding scan, which was negative initially. However, the patient had a massive bloody bowel movement and then became hypotensive.  Dr.  Delana Meyer was requested to evaluate the patient. He suggested the patient may need angiography and embolization. The patient underwent mesenteric embolization on 04/08/2012.  After embolization, the patient's hemoglobin decreased to 6.9 from 7.2. The patient got another 2 units of blood transfusion. Hemoglobin increased to 8.8, and today the patient's hemoglobin is stabilized at 9.0. The patient denies any bloody stool or melena. No active bleeding of orifices. The patient started clear liquid yesterday and tolerated it well. The patient started a regular diet today. So far, no abdominal pain, nausea, vomiting, no active bleeding. The patient's blood pressure is controlled. Generally the patient is clinically stable and will be discharged to home today.   I discussed the patient's discharge plan with the patient, nurse and case manager.   TIME SPENT: About 42 minutes.  ____________________________ Demetrios Loll, MD qc:cbb D: 04/11/2012 15:40:17 ET T: 04/12/2012 11:06:45 ET JOB#: [id number]  cc: Demetrios Loll, MD, <Dictator> Floria Raveling. Astrid Divine, MD Demetrios Loll MD ELECTRONICALLY SIGNED 04/14/2012 14:38

## 2014-11-13 NOTE — Consult Note (Signed)
Chief Complaint:   Subjective/Chief Complaint No hematochezia s/o angio with embolizaton. Hgb = 7.3 Vitals stable.   VITAL SIGNS/ANCILLARY NOTES: **Vital Signs.:   14-Sep-13 09:00   Pulse Pulse 80   Respirations Respirations 19   Systolic BP Systolic BP 820   Diastolic BP (mmHg) Diastolic BP (mmHg) 72   Mean BP 95   Pulse Ox % Pulse Ox % 96   Pulse Ox Heart Rate 80   Brief Assessment:   Additional Physical Exam Abdomen is soft and benign.   Lab Results: Routine Chem:  14-Sep-13 04:23    Glucose, Serum  138   BUN 12   Creatinine (comp) 0.92   Sodium, Serum 141   Potassium, Serum 3.6   Chloride, Serum  111   CO2, Serum  18   Calcium (Total), Serum  7.2   Anion Gap 12   Osmolality (calc) 283   eGFR (African American) >60   eGFR (Non-African American) >60 (eGFR values <41m/min/1.73 m2 may be an indication of chronic kidney disease (CKD). Calculated eGFR is useful in patients with stable renal function. The eGFR calculation will not be reliable in acutely ill patients when serum creatinine is changing rapidly. It is not useful in  patients on dialysis. The eGFR calculation may not be applicable to patients at the low and high extremes of body sizes, pregnant women, and vegetarians.)   Magnesium, Serum  1.6 (1.8-2.4 THERAPEUTIC RANGE: 4-7 mg/dL TOXIC: > 10 mg/dL  -----------------------)  Routine Hem:  14-Sep-13 04:23    WBC (CBC) 8.9   RBC (CBC)  2.40   Hemoglobin (CBC)  7.3   Hematocrit (CBC)  21.4   Platelet Count (CBC)  122   MCV 89   MCH 30.3   MCHC 34.1   RDW  17.6   Neutrophil % 77.5   Lymphocyte % 12.9   Monocyte % 9.5   Eosinophil % 0.0   Basophil % 0.1   Neutrophil #  6.9   Lymphocyte # 1.1   Monocyte # 0.8   Eosinophil # 0.0   Basophil # 0.0 (Result(s) reported on 09 Apr 2012 at 05:32AM.)   Assessment/Plan:  Assessment/Plan:   Assessment Lower GI bleed s/p angiogram with embolization. No signs of active bleeding and he is hemodynamically  stable.    Plan Follow H and H and transfuse if needed. May start clear liquids but will wait for vascular surgery to decide. Will follow.   Electronic Signatures: IJill Side(MD)  (Signed 14-Sep-13 10:05)  Authored: Chief Complaint, VITAL SIGNS/ANCILLARY NOTES, Brief Assessment, Lab Results, Assessment/Plan   Last Updated: 14-Sep-13 10:05 by IJill Side(MD)

## 2014-11-13 NOTE — Op Note (Signed)
PATIENT NAME:  Brian Callahan, MITCHAM MR#:  [id number] DATE OF BIRTH:  01/15/1941  DATE OF PROCEDURE:  04/08/2012  PREOPERATIVE DIAGNOSIS: Massive GI bleed.   POSTOPERATIVE DIAGNOSIS:  Massive GI bleed.   PROCEDURES PERFORMED:  1. Abdominal aortogram.  2. Selective injection of the superior mesenteric artery.  3. Selective injection of the right colic artery, third order catheter placement.  4. Selective injection of the middle colic artery, additional third order catheter placement.  5. Selective injection of the inferior mesenteric artery, second order catheter placement.   SURGEON: Katha Cabal, M.D.   SEDATION: Versed 2 mg plus fentanyl 50 mcg administered IV. Continuous ECG, pulse oximetry, and cardiopulmonary monitoring was performed throughout the entire procedure by the interventional radiology nurse. Total sedation time is 1 hour, 50 minutes.   ACCESS: 6 French sheath, right common femoral artery.   FLUORO TIME: 25 minutes.   CONTRAST USED: Isovue 90 mL.   INDICATIONS: Brian Callahan is a 74 year old gentleman who has been undergoing gastrointestinal bleeding for several days. Earlier today he was found to have massive GI bleed and has been transferred to the Intensive Care Unit. I have been asked to evaluate for possible embolization. Risks and benefits were reviewed. All questions answered. The patient agrees to proceed.   DESCRIPTION OF PROCEDURE: The patient is taken to the Special Procedures suite and placed in the supine position. After adequate sedation is achieved, the right groin is prepped and draped in sterile fashion. Ultrasound is placed in a sterile sleeve. Ultrasound is utilized secondary to lack of appropriate landmarks to avoid vascular injury. Image is recorded for the permanent record. Under direct ultrasound visualization, a micropuncture needle is used to access the anterior wall of the common femoral artery, microwire followed by micro sheath, J-wire followed by a  6  French sheath and 5 French pigtail catheter. The pigtail catheter is positioned at the level of T11 and a lateral projection of the aorta is obtained. VS-1 catheter is then used with a floppy Glidewire back-loaded onto a LIMA-guiding catheter and the SMA is engaged. The wire is advanced out and subsequently in combination with the VS-1 and then the LIMA. The II is then repositioned AP and through the VS-1 catheter angiography of the SMA is obtained in the AP projection. Right colic, which appears to share a common origin with the ileocolic, is identified and using a prograde catheter the catheter is advanced down into the right colic past the first branch and represents third order catheter placement. Using 3-mL aliquots, the selective imaging of the right and ileocolic is made. Faint blush is perceived and therefore 300 - 500- micron beads are opened onto the field and a total of 1 cubic centimeter is delivered through the prograde catheter. Follow-up angiography demonstrates a marked decrease in the flow with elimination of the previously noted blush. The catheter combination is then repositioned and the middle colic is then engaged. Again the prograde is advanced out into the middle colic, representing additional third order catheter placement, and subsequently in a similar fashion injection is made. There appears to be a faint blush in the mid ascending colon, and this time only two-thirds of a cubic centimeter of beads is utilized, again the 300 - 500-micron beads. Follow-up angiography demonstrates decrease in the flow and loss of the previously noted blush.   The prograde VS-1 and LIMA catheter are then removed over a wire. The VS-1 catheter is reintroduced. Oblique view of the aorta is obtained and the  IMA origin localized. It did take a fair bit of time to engage the IMA, trying VS-1, VS-2, and C2 catheters, but ultimately the combination of a VS-1 with a gold-tipped glidewire. The VS-1 catheter was then  advanced into the IMA and injections were made with the first including the left colic, the second advancing beyond the left colic, representing additional third order catheter placement. No blushes were seen in the IMA distribution and no beads were placed in this artery.   Oblique view of the right groin was then obtained and a StarClose device subsequently deployed with success. There were no immediate complications.   INTERPRETATION: Images of the aorta and the superior mesenteric artery demonstrate normal anatomy. The area of injection within the right colic as well as the middle colic does suggest a faint blush in the ascending colon midportion. Approximately 1-2/3 cubic centimeters of  300 - 500-micron beads are instilled. The blush is no longer present. IMA, left colic distribution did appear to be normal.      SUMMARY: Successful embolization of the right colon as described above.    ____________________________ Katha Cabal, MD ggs:bjt D: 04/08/2012 20:55:59 ET T: 04/09/2012 09:03:59 ET JOB#: [id number]  cc: Katha Cabal, MD, <Dictator> Floria Raveling. Astrid Divine, MD [name], MD Katha Cabal MD ELECTRONICALLY SIGNED 04/26/2012 14:05

## 2014-11-13 NOTE — Consult Note (Signed)
Dr. Dionne Milo to cover this weekend for this patient and is aware of current plans.  Electronic Signatures: [name] (MD)  (Signed on 13-Sep-13 18:14)  Authored  Last Updated: 13-Sep-13 18:14 by [name] (MD)

## 2014-11-13 NOTE — Consult Note (Signed)
PATIENT NAME:  Brian Callahan, Brian Callahan MR#:  [id number] DATE OF BIRTH:  [birth year]  DATE OF CONSULTATION:  04/08/2012  REFERRING PHYSICIAN:   CONSULTING PHYSICIAN:  Davene Jobin A. Eitan Doubleday, MD  REASON FOR CONSULTATION: Evaluation of GI bleed.   HISTORY OF PRESENT ILLNESS: Brian Callahan is a pleasant 74 year old male who over the last week had the acute onset of bloody bowel movements that started with blood on his toilet paper first and then became more frequent last Tuesday. Became worse and he was admitted yesterday after four or five bloody bowel movements.  He was found to have a hemoglobin of 7.1 at presentation. He got two units of blood and it went up to 7.8. This morning it was 7.5.  He had a tagged red blood cell scan yesterday which was negative.  Today he had a bloody bowel movement and a large amount of blood subsequently.  He passed out and was brought to the Intensive Care Unit.  Otherwise, no abdominal pain, fevers, chills, night sweats, shortness of breath, nausea, vomiting, diarrhea, constipation, chest pain, dysuria, or hematuria.  Last colonoscopy was in 2003 and found to be normal, has not had one since. He does mention that he had a bad steak at a restaurant approximately a week ago prior to this event.  PAST MEDICAL HISTORY:  1. Colon resection for what sounds like obstruction secondary to diverticulitis in 1998.  2. Hypertension.  3. Hyperlipidemia.   PAST SURGICAL HISTORY:  1. History of appendectomy.  2. History of bowel resection for diverticulitis.  3. History of inguinal hernia repair.  4. Cervical fusion.   ALLERGIES:  No known drug allergies.   MEDICATIONS: 1. Aspirin 81 daily. 2. Enalapril 10 mg p.o. daily.  3. HCT, metoprolol  25/50 p.o. daily.  4. Multivitamin. 5. Zetia.  SOCIAL HISTORY: History of heavy daily drinking, but no current drinking. Does not smoke. No drugs. Is here with his wife.  FAMILY HISTORY:  Father and brother have diabetes.   REVIEW OF  SYSTEMS: 12-point review of systems was performed. Pertinent positives and negatives as above.  PHYSICAL EXAMINATION: VITAL SIGNS:  Per last recording, temperature 98.1, pulse 59, now in the 100s, respirations 12, blood pressure 133/71. Pulse oximetry is 100%.   GENERAL: No acute distress.  Alert and oriented x 3.  HEAD: Normocephalic, atraumatic.   EYES: No scleral icterus. No conjunctivitis.   NECK: Supple. No obvious masses.  CHEST: Lungs clear to auscultation, moving air well.   HEART: Tachycardic, regular . No murmurs, rubs, or gallops.   ABDOMEN: Soft, nontender, nondistended.   EXTREMITIES: Moves all extremities well. Strength 5/5 all 4 extremities   LABORATORY DATA:  Recent labs show a white cell count of 4.7, hemoglobin of 7.5, hematocrit 21.1, platelets are 153.   Renal panel is significant for potassium 3.2.   New labs are pending.   Tagged red blood cell scan was negative per Dr. Vira Agar in communication with him.   ASSESSMENT AND PLAN: Brian Callahan is a 74 year old male with GI bleed, which has not been localized. I would recommend resuscitating him with blood, check a full set of labs, ensuring good IV access and repeating his tagged red blood cell scan to attempt localization. I would also potentially recommend vascular consult if localization is unable to be determined by tagged red blood cell scan, which is not uncommon. I have talked to Dr. Vira Agar. He will consider colonoscopy if tagged red cell scan was negative. We will continue to follow with  you.     ____________________________ Glena Norfolk. Arelie Kuzel, MD cal:bjt D: 04/08/2012 14:04:31 ET T: 04/08/2012 14:47:59 ET JOB#: [id number]  cc: Harrell Gave A. Purnell Daigle, MD, <Dictator> Floyde Parkins MD ELECTRONICALLY SIGNED 04/09/2012 9:50

## 2014-11-17 NOTE — Consult Note (Signed)
PATIENT NAME:  Brian Callahan, Brian Callahan MR#:  [id number] DATE OF BIRTH:  08/02/1940  DATE OF CONSULTATION:  12/02/2013  REFERRING PHYSICIAN:  Dr. Leanora Cover CONSULTING PHYSICIAN:  Aaron Mose. Quincee Gittens, MD  REASON FOR CONSULTATION:  Lower gastrointestinal bleed and critical care admission.  PRIMARY CARE PHYSICIAN:  Previously Dr. Astrid Divine, is scheduled to see Dr. Fulton Reek, however has not actually seen him yet.    CHIEF COMPLAINT:  Gastrointestinal bleed.   HISTORY OF PRESENT ILLNESS:  This is a 73 year old Caucasian gentleman with past medical history of hyperlipidemia, hypertension as well as prior GI bleed back in 2013 requiring embolectomy, but note also had a partial colectomy for what he states was "my bowels stopped working."  On the day of admission he noted to have multiple episodes of bright red blood per rectum.  Initially the first episode was mixed with stool; however, all subsequent episodes were bright red blood only.  Since arrival to the Emergency Department had two further episodes, admitted by the surgery staff.  He has received 1 unit transfusion of packed red blood cells thus far.  He has remained hemodynamically stable since transfusion, is complaining only of multiple bouts of red blood per rectum; however, denies any abdominal pain or further symptomatology.  Currently, he is without complaints.   REVIEW OF SYSTEMS:  CONSTITUTIONAL:  Denies fever, fatigue.  Positive generalized weakness.  EYES:  Denied blurry vision, double vision, eye pain.  EARS, NOSE, THROAT:  Denies tinnitus, ear pain, hearing loss.  RESPIRATORY:  Denies cough, wheeze, shortness of breath.  CARDIOVASCULAR:  Denies chest pain, palpitations, edema.  GASTROINTESTINAL:  Denies any nausea.  Denies vomiting.  Positive for abdominal pain.  Positive for bright red blood per rectum.  GENITOURINARY:  Denies dysuria, hematuria.  ENDOCRINE:  Denies nocturia or thyroid problems.  HEMATOLOGIC AND LYMPHATIC:  Denies easy  bruising.  Positive for bleeding as described above.  SKIN:  Denies rashes or lesions.  MUSCULOSKELETAL:  Denies pain in neck, back, shoulders, knees, hips or arthritic symptoms.  NEUROLOGIC:  Denies paralysis, paresthesias.  PSYCHIATRIC:  Denies anxiety or depressive symptoms.  Otherwise, full review of systems performed by me is negative.   PAST MEDICAL HISTORY:  Diet-controlled diabetes, hyperlipidemia, hypertension, as well as a history of GI bleed as outlined above.   SOCIAL HISTORY:  Occasional alcohol use.  Denies any tobacco or drug usage.   FAMILY HISTORY:  Positive for coronary artery disease.   ALLERGIES:  No known drug allergies.   HOME MEDICATIONS:  Include Zetia 10 mg by mouth daily, multivitamins 1 tab by mouth daily, hydrochlorothiazide/metoprolol 25/50 mg by mouth daily, enalapril 10 mg by mouth daily.   PHYSICAL EXAMINATION: VITAL SIGNS:  Temperature 97.5, heart rate 84, respirations 18, blood pressure 134/79, saturating 98% on room air.  Weight 83.9 kg, BMI 27.3.  GENERAL:  Well-nourished, well-developed, Caucasian gentleman, currently in no acute distress.  HEAD:  Normocephalic, atraumatic.  EYES:  Pupils equal, round, reactive to light.  Extraocular muscles intact.  No scleral icterus.  MOUTH:  Moist mucosal membranes.  Dentition intact.  No abscess noted.  EAR, NOSE, THROAT:  Throat clear without exudates or inflammation.  NECK:  Supple.  No thyromegaly.  No nodules.  No JVD.  PULMONARY:  Clear to auscultation bilaterally without wheezes, rales, rhonchi.  No use of accessory muscles.    CARDIOVASCULAR:  S1, S2, regular rate and rhythm.  No murmurs, rubs, or gallops.  No edema.  Pedal pulses 2+ bilaterally.  GASTROINTESTINAL:  Soft, nontender, nondistended.  No masses.  Positive bowel sounds.  No hepatosplenomegaly.  MUSCULOSKELETAL:  No swelling, clubbing, or edema.  Range of motion full in all extremities.  NEUROLOGIC:  Cranial nerves II through XII intact.  No  gross focal neurological deficits.  Sensation intact.  Reflexes intact.   SKIN:  No ulcerations, lesions, rashes, cyanosis.  Skin warm, dry.  Turgor intact.  PSYCHIATRIC:  Mood and affect within normal limits.  The patient is awake, alert and oriented x 3.  Insight and judgment intact.   LABORATORY DATA:  Sodium 139, potassium 3.4, chloride 105, bicarb 27, BUN 15, creatinine 1.04, blood glucose 165.  LFTs within normal limits.  WBC 6.4, hemoglobin 13.1, platelets 224, INR of 1.   ASSESSMENT AND PLAN:  This is a 74 year old Caucasian gentleman with past medical history of hypertension and hyperlipidemia as well as prior gastrointestinal bleed requiring embolectomy as well as partial colectomy for unclear reasons, presenting with bright red blood per rectum.  1.  Gastrointestinal bleed/bright red blood per rectum:  Continued bleeding episodes in the Emergency Department, received 1 unit of packed red blood cells thus far, seems to be hemodynamically stable now.  He already has tagged red blood cell scan that is pending as well as vascular surgery and gastroenterology services consulted.  Would recommend trying to get CBCs q. 6 hours with transfusion threshold hemoglobin less than 7 and follow the above studies searching for etiology.  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  2.  Hypertension.  We will hold hydrochlorothiazide and ACE inhibitors.  He needs as needed hydralazine for systolic greater than 165 or diastolic greater than 790 as needed for blood pressure control.  3.  Hyperlipidemia.  We will resume Zetia when he is able to tolerate by mouth.  4.  Venous thromboembolism prophylaxis with sequential compression devices.  5.  CODE STATUS:  THE PATIENT IS A FULL CODE.   TIME SPENT:  45 minutes.     ____________________________ Aaron Mose. Karlina Suares, MD dkh:ea D: 12/02/2013 02:10:33 ET T: 12/02/2013 07:10:10 ET JOB#: [id number]  cc: Aaron Mose. Tarini Carrier, MD, <Dictator> Dalilah Curlin  Woodfin Ganja MD ELECTRONICALLY SIGNED 12/02/2013 20:50

## 2014-11-17 NOTE — Discharge Summary (Signed)
PATIENT NAME:  Brian Callahan, Brian Callahan MR#:  [id number] DATE OF BIRTH:  05-Aug-1940  DATE OF ADMISSION:  12/02/2013 DATE OF DISCHARGE:  12/06/2013  PRINCIPLE DIAGNOSIS: Lower gastrointestinal hemorrhage, presumably from rectal diverticula.   OTHER DIAGNOSES: 1. Status post right colic and middle colic artery micro embolization December 2013.  2. Psoriasis.  3. Dyslipidemia.  4. Type 2 diabetes mellitus.  5. Hypertension.  6. Status post colectomy (unknown, but not right colectomy).  7. Status post appendectomy.  8. Status post inguinal hernia repair.  9. Status post cervical spine fusion.   HOSPITAL COURSE: Mr. Gipe was admitted to the hospital and transfused and given fresh frozen plasma, and GI bleeding scan was ordered which, by that time, it was obtained, failed to localize the site of bleeding. An EGD was performed which showed a normal esophagus, moderate amount of inflammation in the lesser curve of the gastric antrum with a small nonbleeding cratered gastric ulcer with no stigmata of recent bleeding and a normal duodenum. A colonoscopy was performed which showed an ascending colon, a sessile polyp, which was removed successfully and there was pan diverticulosis from the rectum to ascending colon. There were medium-sized internal hemorrhoids, but most significantly there was an adherent clot in the rectum when lavaged a very small diverticulum was seen from the clot emerged. Three clips were applied to that site, and there was no post clip bleeding. That was thought to be the area of hemorrhage. The following day the patient was discharged and given a follow-up appointment with Dr. Verta Ellen in gastroenterology. He was again instructed never to take aspirin or products containing aspirin again.   ____________________________ Consuela Mimes, MD wfm:sg D: 12/13/2013 08:04:00 ET T: 12/13/2013 08:28:45 ET JOB#: [id number]  cc: Consuela Mimes, MD, <Dictator> Consuela Mimes  MD ELECTRONICALLY SIGNED 12/14/2013 16:34

## 2014-11-17 NOTE — Consult Note (Signed)
Pt with bleeding after supper, I was never called. had at least 2 significant bleeding spells, felt light headed, hgb down to 5.5, looks pale.  Will transfuse 2 units and later do EGD and if neg repeat bleeding scan or do colonoscopy prep and procedure. Intermittent bleeding with 2 neg bleeding scans can be very difficult to localize.  Electronic Signatures: [name] (MD)  (Signed on 11-May-15 07:44)  Authored  Last Updated: 11-May-15 07:44 by [name] (MD)

## 2014-11-17 NOTE — Consult Note (Signed)
Pt was indeed on Indocin 50mg  2-3 times a day for acute gout 4-5 days before bleeding started.  Repeat GI bleeding scan was neg, pt passed a small amount of blood with brown stool.  Continue Protonix, do EGD tomorrow, hgb down a little, likely dilutional. If EGD neg and no further bleeding will observe patient only and not do colonoscopy unless rebleeding of signif nature occurs.  Electronic Signatures: [name] (MD)  (Signed on 10-May-15 16:56)  Authored  Last Updated: 10-May-15 16:56 by [name] (MD)

## 2014-11-17 NOTE — Discharge Summary (Signed)
PATIENT NAME:  Brian Callahan, Brian Callahan MR#:  [id number] DATE OF BIRTH:  09-Jul-1941  DATE OF ADMISSION:  12/02/2013 DATE OF DISCHARGE:  12/06/2013  PRINCIPLE DIAGNOSIS: Lower gastrointestinal hemorrhage, presumably diverticular.   OTHER DIAGNOSES: 1. Status post right colic and middle colic artery micro embolization December 2013.  2. Psoriasis.  3. Dyslipidemia.  4. Type 2 diabetes mellitus.  5. Hypertension.  6. Status post colectomy (unknown, but not right colectomy).  7. Status post appendectomy.  8. Status post inguinal hernia repair.  9. Status post cervical spine fusion.   HOSPITAL COURSE: Mr. Dahlem was admitted to the hospital and transfused and given fresh frozen plasma, and GI bleeding scan was ordered which, by that time, it was obtained, failed to localize the site of bleeding. An EGD was performed which showed a normal esophagus, moderate amount of inflammation in the lesser curve of the gastric antrum with a small nonbleeding cratered gastric ulcer with no stigmata of recent bleeding and a normal duodenum. A colonoscopy was performed which showed an ascending colon, a sessile polyp, which was removed successfully and there was pan diverticulosis from the rectum to ascending colon. There were medium-sized internal hemorrhoids, but most significantly there was an adherent clot in the rectum when lavaged a very small diverticulum was seen from the clot emerged. Three clips were applied to that site, and there was no post clip bleeding. That was thought to be the area of hemorrhage. The following day the patient was discharged and given a follow-up appointment with Dr. Verta Ellen in gastroenterology. He was again instructed never to take aspirin or products containing aspirin again.   ____________________________ Consuela Mimes, MD wfm:sg D: 12/13/2013 08:04:33 ET T: 12/13/2013 08:28:45 ET JOB#: [id number]  cc: Consuela Mimes, MD, <Dictator>

## 2014-11-17 NOTE — Consult Note (Signed)
Small tic with adherrent clot, would not wash off, found in proximal rectum and I placed 3 clips on it with good results.  Will start full liquid diet and advance tomorrow to soft food.  Likely home Thursday.  Electronic Signatures: [name] (MD)  (Signed on 12-May-15 13:11)  Authored  Last Updated: 12-May-15 13:11 by [name] (MD)

## 2014-11-17 NOTE — Consult Note (Signed)
Brief Consult Note: Diagnosis: acute LGIB.   Patient was seen by consultant.   Consult note dictated.   Orders entered.   Comments: 1. GIB/BRBPR: continue episodes in ED, received PRBC, appears to have tagged rbc study pending, as well as vasc sx and gi c/s: rec [id number], transfusion threshold hgb <7 2. htn: hold hctz/acei, can use prn hydralazine sbp>180 or dbp>100 if needed 3. hld: resume zetia when tolerate po 4. vte px: scd.  Electronic Signatures: Ree Alcalde, Aaron Mose (MD)  (Signed 09-May-15 01:57)  Authored: Brief Consult Note   Last Updated: 09-May-15 01:57 by Lytle Butte (MD)

## 2014-11-17 NOTE — Consult Note (Signed)
PATIENT NAME:  Brian Callahan, BIRKY MR#:  [id number] DATE OF BIRTH:  Jun 12, 1941  DATE OF CONSULTATION:  12/03/2013  CONSULTING PHYSICIAN:  [name], MD  The patient is a 74 year old white male whose story began about 10 days ago when he had an attack of gout and he took a medicine for four days that likely was Indocin. I am waiting on confirmation on that. He took the last dose Monday. Friday he had nausea and Friday afternoon, lasted a few hours and he passed stool with some blood mixed in it, and then he passed stools of nothing but bright red blood, came to the hospital and was admitted to the hospital. His hemoglobin on admission was 13, latest hemoglobin was in the 8 range. He had a bleeding scan yesterday that did not show any active bleeding. He passed some darker looking stools later yesterday. This morning passed a stool with virtually no blood in it. I was asked to see him in consultation for GI bleeding. The patient has a history of prior GI bleeding, had colon surgery in 1998 by with a right colon resection. In mid 2013 he had embolization for lower GI bleeding. He had a colonoscopy later on after that that showed diffuse diverticulosis throughout the colon with an ileal colonic anastomosis.   The patient rarely takes nonsteroidal anti-inflammatory drugs. He is not sure of the gout medicine, but he took it for four days.   REVIEW OF SYSTEMS: The patient currently review of systems he denies abdominal pain. No chest pains. No shortness of breath. No lightheadedness today when he gets up to the bathroom.  Other review of systems he has nocturia once a night. No changes in vision or hearing Previous history of hypertension and elevated lipids.   HOME MEDICATIONS: Zetia 10 mg by mouth daily, HCTZ/metoprolol 25/50 daily, enalapril 10 mg daily.   PAST SURGICAL HISTORY: He has had appendectomy. He has had hernia surgery and he had colon resection. Since last night he has not passed any blood.    His previous embolization was right a colic and middle colic arteries in 19/4174. He had the recent bilateral cataract surgery with Dr. Sandra Cockayne   PHYSICAL EXAMINATION: GENERAL: White male in no acute distress sitting in bed, finished a breakfast of clear liquids.   HEENT: Sclerae anicteric. Conjunctivae a little pale. Tongue a little pale.  HEAD: Atraumatic.  NECK: No carotid bruits.  CHEST: Clear, slightly decreased breath sounds in right base.  HEART: No murmurs, gallops, clicks, or rubs.  ABDOMEN: Scattered mild erythematous rash. No hepatosplenomegaly. No masses. No bruits. Old scars present.  EXTREMITIES: No edema.   PHYSICAL EXAMINATION:  VITAL SIGNS: Temperature 98.4, pulse 98, respirations 20, blood pressure 130/76, pulse oximetry 96%, hemoglobin of 8.2, platelet count 162. White count 5.2. He has O- blood with negative antibody screen. Pro time 13.4 on admission INR of 1. Liver panel on admission showed no abnormalities. Glucose 172, BUN 12, creatinine 0.88, sodium 141, potassium 4, chloride 110, CO2 28, lipase 188.   ASSESSMENT: This probably is a lower gastrointestinal bleed, but there is definitely a possibility that this could be an upper source because of the medication that he took for gout about 4 to 5 days before the bleeding started. If this is indomethacin then it raises the possibility that this is upper gastrointestinal bleed. However, nonsteroidal anti-inflammatory drugs can cause bleeding in the small intestine or in the colon also.  It is certainly possible that he had diverticular  bleeding as well. Since he had a colonoscopy a little over a year and a half ago and it showed just a small 5 mm polyp and I am reluctant to put him through another colonoscopy at this time for knocking off whatever clot has formed, which has stopped his bleeding. I will keep him on clear liquid diet and do upper endoscopy tomorrow, and if that is negative, then Tuesday we can start him on a  full liquid diet.  If further bleeding occurs, stat GI bleeding scan will be recommended. I have changed his IV Pepcid to IV pantoprazole. We will follow with you.   ____________________________ [name], MD rte:sg D: 12/03/2013 10:42:00 ET T: 12/03/2013 11:43:02 ET JOB#: [id number]  cc: Elta Guadeloupe A. Marina Gravel, MD Aaron Mose. Hower, MD [name], MD, <Dictator> Dr. Domenick Gong MD ELECTRONICALLY SIGNED 12/20/2013 17:43

## 2014-11-17 NOTE — Op Note (Signed)
**Note Brian Callahan-Identified via Obfuscation** PATIENT NAME:  Brian Callahan, Brian Callahan MR#:  [id number] DATE OF BIRTH:  1941-05-26  DATE OF PROCEDURE:  09/11/2013  PREOPERATIVE DIAGNOSIS: Cataract, left eye.   POSTOPERATIVE DIAGNOSIS: Cataract, left eye.   PROCEDURE PERFORMED: Extracapsular cataract extraction using phacoemulsification with placement of Alcon [id number], 21.0-diopter posterior chamber lens, serial number [id number].   SURGEON: Brian Back. Liannah Yarbough, M.D.   ANESTHESIA: 4% lidocaine and 0.75% Marcaine a 50-50 mixture with 10 units/mL of Hylenex added, given as a peribulbar.   ANESTHESIOLOGIST: Dr. Kayleen Memos.   COMPLICATIONS: None.   ESTIMATED BLOOD LOSS: Less than 1 mL.   DESCRIPTION OF PROCEDURE: The patient was brought to the operating room and given a peribulbar block.  The patient was then prepped and draped in the usual fashion.  The vertical rectus muscles were imbricated using 5-0 silk sutures.  These sutures were then clamped to the sterile drapes as bridle sutures.  A limbal peritomy was performed extending two clock hours and hemostasis was obtained with cautery.  A partial thickness scleral groove was made at the surgical limbus and dissected anteriorly in a lamellar dissection using an Alcon crescent knife.  The anterior chamber was entered supero-temporally with a Superblade and through the lamellar dissection with a 2.6 mm keratome.  DisCoVisc was used to replace the aqueous and a continuous tear capsulorrhexis was carried out.  Hydrodissection and hydrodelineation were carried out with balanced salt and a 27 gauge canula.  The nucleus was rotated to confirm the effectiveness of the hydrodissection.  Phacoemulsification was carried out using a divide-and-conquer technique.  Total ultrasound time was 1 minute and 41 seconds with an average power of 25.7%. CDE 40.95.  Irrigation/aspiration was used to remove the residual cortex.  DisCoVisc was used to inflate the capsule and the internal incision was enlarged to 3 mm with the  crescent knife.  The intraocular lens was folded and inserted into the capsular bag using the AcrySert delivery system.  Irrigation/aspiration was used to remove the residual DisCoVisc.  Miostat was injected into the anterior chamber through the paracentesis track to inflate the anterior chamber and induce miosis.  A tenth of a mL of cefuroxime was injected via the paracentesis track containing 1 mg of drug. The wound was checked for leaks and none were found. The conjunctiva was closed with cautery and the bridle sutures were removed.  Two drops of 0.3% Vigamox were placed on the eye.   An eye shield was placed on the eye.  The patient was discharged to the recovery room in good condition.  ____________________________ Brian Back Chrys Landgrebe, MD sad:aw D: 09/11/2013 11:48:02 ET T: 09/11/2013 13:20:25 ET JOB#: [id number]  cc: Remo Lipps A. Aydrian Halpin, MD, <Dictator> Martie Lee MD ELECTRONICALLY SIGNED 10/02/2013 13:46

## 2014-11-17 NOTE — Op Note (Signed)
PATIENT NAME:  Brian Callahan, Brian Callahan MR#:  [id number] DATE OF BIRTH:  1940/10/02  DATE OF PROCEDURE:  08/14/2013  PREOPERATIVE DIAGNOSIS: Cataract, right eye.   POSTOPERATIVE DIAGNOSIS: Cataract, right eye.   PROCEDURE PERFORMED: Extracapsular cataract extraction using phacoemulsification with placement of Alcon SN6CWS, 20.5-diopter posterior chamber lens, serial number [id number].   SURGEON: Loura Back. Teasha Murrillo, MD   ANESTHESIA: 4% lidocaine and 0.75% Marcaine in a 50-50 mixture with 10 units/mL of Hylenex added, given as a peribulbar.   ANESTHESIOLOGIST: Dr. Myra Gianotti.   COMPLICATIONS: None.   ESTIMATED BLOOD LOSS: Less than 1 mL.  DESCRIPTION OF PROCEDURE:  The patient was brought to the operating room and given a peribulbar block.  The patient was then prepped and draped in the usual fashion.  The vertical rectus muscles were imbricated using 5-0 silk sutures.  These sutures were then clamped to the sterile drapes as bridle sutures.  A limbal peritomy was performed extending two clock hours and hemostasis was obtained with cautery.  A partial thickness scleral groove was made at the surgical limbus and dissected anteriorly in a lamellar dissection using an Alcon crescent knife.  The anterior chamber was entered superonasally with a Superblade and through the lamellar dissection with a 2.6 mm keratome.  DisCoVisc was used to replace the aqueous and a continuous tear capsulorrhexis was carried out.  Hydrodissection and hydrodelineation were carried out with balanced salt and a 27 gauge canula.  The nucleus was rotated to confirm the effectiveness of the hydrodissection.  Phacoemulsification was carried out using a divide-and-conquer technique.  Total ultrasound time was 2 minutes and 29 seconds with an average power of  25.4%. CDE of 65.59.   Irrigation/aspiration was used to remove the residual cortex.  DisCoVisc was used to inflate the capsule and the internal incision was enlarged to 3 mm with  the crescent knife.  The intraocular lens was folded and inserted into the capsular bag using the AcrySert delivery system.  Irrigation/aspiration was used to remove the residual DisCoVisc.  Miostat was injected into the anterior chamber through the paracentesis track to inflate the anterior chamber and induce miosis. 0.1 mL of cefuroxime containing 1 mg of drug was injected via the paracentesis track. The wound was checked for leaks and none were found. The conjunctiva was closed with cautery and the bridle sutures were removed.  Two drops of 0.3% Vigamox were placed on the eye.   An eye shield was placed on the eye.  The patient was discharged to the recovery room in good condition.   ____________________________ Loura Back Cherrie Franca, MD sad:jcm D: 08/14/2013 10:38:10 ET T: 08/14/2013 19:01:46 ET JOB#: [id number]  cc: Remo Lipps A. Shakeia Krus, MD, <Dictator> Martie Lee MD ELECTRONICALLY SIGNED 08/21/2013 13:35

## 2014-11-17 NOTE — H&P (Signed)
History of Present Illness 47 yom who had abdominal pain about 5 PM, which resolved and recurred. He had soda pop and then a glass of milk ~ 9PM and since then, in the last 2 hours, he has had 5 bloody BMs, the first of which was mixed with stool. The pt took 2 325 mg ASA 2 days ago, and o/w has had no blood thinners recently. He was diy after his last BM, but has not passed out. He underwent colectomy 1998 (unknown, but not a right colectomy) for diverticular disease. He had a similar LGI bleed 03/2012, and had Right colic and Middle colic arteries microembolied. In 06/2012, Dr Tiffany Kocher did a colonoscopy showing pan-diverticulosis.   Past History Recent bilateral cataract surgery   Past Med/Surgical Hx:  Psoriasis:   Hypercholesterolemia:   Diabetes Mellitus, Type II (NIDD):   Hypertension:   Bowel resection:   Appendectomy:   Inguinal Hernia Repair:   Cervical spine fusion:   ALLERGIES:  No Known Allergies:   HOME MEDICATIONS: Medication Instructions Status  enalapril 10 mg oral tablet 1 tab(s) orally once a day Active  hydrochlorothiazide-metoprolol 25 mg-50 mg oral tablet 1 tab(s) orally once a day Active  multivitamin 1 tab(s) orally once a day Active  Zetia 10 mg oral tablet 1 tab(s) orally once a day Active   Family and Social History:  Family History Non-Contributory   Social History negative tobacco, positive ETOH, married, works for [employer] drank 5 - 6 liquor drinks qpm until 3 months ago; very little since   Place of Living Home   Review of Systems:  Fever/Chills No   Cough No   Sputum No   Abdominal Pain Yes   Diarrhea No  hematochezia   Constipation No   Nausea/Vomiting No   SOB/DOE No   Chest Pain No   Dysuria No   Tolerating PT Yes   Tolerating Diet Yes   Medications/Allergies Reviewed Medications/Allergies reviewed   Physical Exam:  GEN well developed, well nourished   HEENT pink conjunctivae, PERRL, hearing intact to voice,  moist oral mucosa, Oropharynx clear   NECK supple  trachea midline   RESP normal resp effort  clear BS  no use of accessory muscles   CARD regular rate  no murmur  no thrills  no JVD   ABD denies tenderness  normal BS  protuberant, soft, nontender   EXTR negative cyanosis/clubbing, negative edema   SKIN positive rashes, psoriatic   NEURO cranial nerves intact, negative tremor, follows commands, motor/sensory function intact   PSYCH alert, A+O to time, place, person, good insight   Lab Results: Hepatic:  08-May-15 21:58   Albumin, Serum 3.6  Routine Chem:  08-May-15 21:58   Lipase 188 (Result(s) reported on 01 Dec 2013 at 10:48PM.)  Glucose, Serum  165  BUN 15  Creatinine (comp) 1.04  Sodium, Serum 139  Potassium, Serum  3.4  Chloride, Serum 105  CO2, Serum 27  Calcium (Total), Serum 8.9  Osmolality (calc) 282  eGFR (African American) >60  eGFR (Non-African American) >60 (eGFR values <62m/min/1.73 m2 may be an indication of chronic kidney disease (CKD). Calculated eGFR is useful in patients with stable renal function. The eGFR calculation will not be reliable in acutely ill patients when serum creatinine is changing rapidly. It is not useful in  patients on dialysis. The eGFR calculation may not be applicable to patients at the low and high extremes of body sizes, pregnant women, and vegetarians.)  Anion Gap  7  Routine Coag:  08-May-15 21:58   Prothrombin 13.4  INR 1.0 (INR reference interval applies to patients on anticoagulant therapy. A single INR therapeutic range for coumarins is not optimal for all indications; however, the suggested range for most indications is 2.0 - 3.0. Exceptions to the INR Reference Range may include: Prosthetic heart valves, acute myocardial infarction, prevention of myocardial infarction, and combinations of aspirin and anticoagulant. The need for a higher or lower target INR must be assessed individually. Reference: The  Pharmacology and Management of the Vitamin K  antagonists: the seventh ACCP Conference on Antithrombotic and Thrombolytic Therapy. IEPPI.9518 Sept:126 (3suppl): [id number]. A HCT value >55% may artifactually increase the PT.  In one study,  the increase was an average of 25%. Reference:  "Effect on Routine and Special Coagulation Testing Values of Citrate Anticoagulant Adjustment in Patients with High HCT Values." American Journal of Clinical Pathology 2006;126:400-405.)  Activated PTT (APTT) 35.4 (A HCT value >55% may artifactually increase the APTT. In one study, the increase was an average of 19%. Reference: "Effect on Routine and Special Coagulation Testing Values of Citrate Anticoagulant Adjustment in Patients with High HCT Values." American Journal of Clinical Pathology 2006;126:400-405.)  Routine Hem:  08-May-15 21:58   WBC (CBC) 6.4  RBC (CBC)  4.11  Hemoglobin (CBC) 13.1  Hematocrit (CBC)  39.7  Platelet Count (CBC) 224 (Result(s) reported on 01 Dec 2013 at 10:37PM.)  MCV 97  MCH 31.9  MCHC 32.9  RDW 13.9    Assessment/Admission Diagnosis LGI hemmorhage, in settingof previous R and Middle Colic A emboliations, and pan diverticulosis, and previous unknown site colectomy (sigmoid?).   Plan Admit to CCU, IVF, blood, tagged RBC scan Vascular consult for possible arteriogram IM consult for CCU admission GI consult for possible localization via colonoscopy   Electronic Signatures: Consuela Mimes (MD)  (Signed [phone] 23:00)  Authored: CHIEF COMPLAINT and HISTORY, PAST MEDICAL/SURGIAL HISTORY, ALLERGIES, HOME MEDICATIONS, FAMILY AND SOCIAL HISTORY, REVIEW OF SYSTEMS, PHYSICAL EXAM, LABS, ASSESSMENT AND PLAN   Last Updated: 08-May-15 23:00 by Consuela Mimes (MD)

## 2014-11-17 NOTE — Consult Note (Signed)
Pt without bleeding, no abd pain.  Can go home and follow up in 4 weeks with me for CBC.  I gave him a precription for Omeprazole for his gastritis and ulcer, advised no NSAID, if flare of gout can take Medrol dose steroid pak.  Electronic Signatures: [name] (MD)  (Signed on 13-May-15 14:22)  Authored  Last Updated: 13-May-15 14:22 by [name] (MD)

## 2014-11-17 NOTE — Consult Note (Signed)
Pt with no further bleeding today.  he had an EGD showing a small ulcer and surrounding gastritis, not likely at all to be source of his bleeding.  He got 2 units of blood today and color looks much better.  Will do colonoscopy tomorrow.  If prep causes him to have bleeding would get a stat GI bleeding scan.  Will start prep at 4am. Colonoscopy to be late tomorrow morning or early afternoon.  Keep 2 units type and screened.  Electronic Signatures: [name] (MD)  (Signed on 11-May-15 17:05)  Authored  Last Updated: 11-May-15 17:05 by [name] (MD)

## 2015-02-23 ENCOUNTER — Other Ambulatory Visit: Payer: Self-pay | Admitting: Urology

## 2015-02-27 IMAGING — NM NUCLEAR MEDICINE GASTROINTESTINAL BLEEDING STUDY
1 series · 6 of 6 positions shown · non-contrast
Comparison: None.

CLINICAL DATA: Acute GI bleed

EXAM:
NUCLEAR MEDICINE GASTROINTESTINAL BLEEDING SCAN
TECHNIQUE: Sequential abdominal images were obtained following intravenous
administration of Pc-JJm labeled red blood cells.
RADIOPHARMACEUTICALS:  20.8 mCi Pc-JJm in-vitro labeled red cells.

[Series 1000: gi bleed · 4.80mm/px · 6 of 250 frames shown]
[frame 21/250]
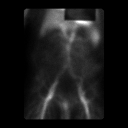
[frame 63/250]
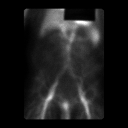
[frame 105/250]
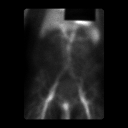
[frame 146/250]
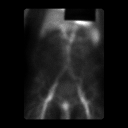
[frame 188/250]
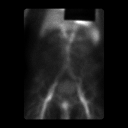
[frame 230/250]
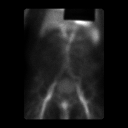

[6 of 6 positions shown; findings below may reference images not displayed]

FINDINGS: Physiologic distribution of radiopharmaceutical. No evidence of
active GI bleed.
IMPRESSION: Negative

## 2015-05-13 ENCOUNTER — Encounter: Payer: Self-pay | Admitting: *Deleted

## 2015-05-18 ENCOUNTER — Other Ambulatory Visit: Payer: Self-pay | Admitting: Urology

## 2015-05-18 DIAGNOSIS — N400 Enlarged prostate without lower urinary tract symptoms: Secondary | ICD-10-CM

## 2015-05-23 ENCOUNTER — Ambulatory Visit (INDEPENDENT_AMBULATORY_CARE_PROVIDER_SITE_OTHER): Payer: Medicare Other | Admitting: Urology

## 2015-05-23 ENCOUNTER — Encounter: Payer: Self-pay | Admitting: Urology

## 2015-05-23 VITALS — BP 123/70 | HR 83 | Ht 67.0 in | Wt 176.1 lb

## 2015-05-23 DIAGNOSIS — N401 Enlarged prostate with lower urinary tract symptoms: Secondary | ICD-10-CM | POA: Diagnosis not present

## 2015-05-23 DIAGNOSIS — L409 Psoriasis, unspecified: Secondary | ICD-10-CM | POA: Insufficient documentation

## 2015-05-23 DIAGNOSIS — R739 Hyperglycemia, unspecified: Secondary | ICD-10-CM | POA: Insufficient documentation

## 2015-05-23 DIAGNOSIS — Z8619 Personal history of other infectious and parasitic diseases: Secondary | ICD-10-CM | POA: Insufficient documentation

## 2015-05-23 DIAGNOSIS — H449 Unspecified disorder of globe: Secondary | ICD-10-CM | POA: Insufficient documentation

## 2015-05-23 DIAGNOSIS — E785 Hyperlipidemia, unspecified: Secondary | ICD-10-CM | POA: Insufficient documentation

## 2015-05-23 DIAGNOSIS — N400 Enlarged prostate without lower urinary tract symptoms: Secondary | ICD-10-CM

## 2015-05-23 DIAGNOSIS — I10 Essential (primary) hypertension: Secondary | ICD-10-CM

## 2015-05-23 DIAGNOSIS — N138 Other obstructive and reflux uropathy: Secondary | ICD-10-CM

## 2015-05-23 DIAGNOSIS — R972 Elevated prostate specific antigen [PSA]: Secondary | ICD-10-CM | POA: Diagnosis not present

## 2015-05-23 HISTORY — DX: Essential (primary) hypertension: I10

## 2015-05-23 LAB — BLADDER SCAN AMB NON-IMAGING: Scan Result: 125

## 2015-05-23 MED ORDER — TAMSULOSIN HCL 0.4 MG PO CAPS: ORAL_CAPSULE | ORAL | Status: DC

## 2015-05-23 MED ORDER — FINASTERIDE 5 MG PO TABS: 5.0000 mg | ORAL_TABLET | Freq: Every day | ORAL | Status: DC

## 2015-05-23 NOTE — Progress Notes (Signed)
Bladder Scan Patient void: 125 ml Performed By: Bonnye Fava, CMA

## 2015-05-23 NOTE — Progress Notes (Signed)
05/23/2015 3:42 PM   [name] October 26, 1940 [id number]  Referring provider: No referring provider defined for this encounter.  Chief Complaint  Patient presents with  . Benign Prostatic Hypertrophy    w/elevated PSA    HPI: Patient is 74 year old white male with BPH and LUTS that is managed with tamsulosin and finasteride who presents today for a 6 month follow up.  BPH WITH LUTS His IPSS score today is 0, which is mild lower urinary tract symptomatology. He is delighted with his quality life due to his urinary symptoms. His PVR is 125 mL.  His previous IPSS score was 5/1.  His previous PVR is 69 mL.   He denies any dysuria, hematuria or suprapubic pain.   His has had biopsy in 2013 for which was negative.   He also denies any recent fevers, chills, nausea or vomiting.   He does not have a family history of PCa.      IPSS      05/23/15 1500       International Prostate Symptom Score   How often have you had the sensation of not emptying your bladder? Not at All     How often have you had to urinate less than every two hours? Not at All     How often have you found you stopped and started again several times when you urinated? Not at All     How often have you found it difficult to postpone urination? Not at All     How often have you had a weak urinary stream? Not at All     How often have you had to strain to start urination? Not at All     How many times did you typically get up at night to urinate? None     Total IPSS Score 0     Quality of Life due to urinary symptoms   If you were to spend the rest of your life with your urinary condition just the way it is now how would you feel about that? Delighted        Score:  1-7 Mild 8-19 Moderate 20-35 Severe     PMH: Past Medical History  Diagnosis Date  . Hyperuricemia   . Hyperglycemia   . GERD (gastroesophageal reflux disease)   . HLD (hyperlipidemia)   . Encounter for long-term (current) use of other  high-risk medications   . GI bleed   . Urinary frequency   . Anemia   . History of [facility] spotted fever   . HTN (hypertension)   . Psoriasis   . BPH with obstruction/lower urinary tract symptoms   . Obesity   . History of elevated PSA   . Incomplete bladder emptying   . BP (high blood pressure) 05/23/2015    Surgical History: Past Surgical History  Procedure Laterality Date  . Cervical spine surgery  2007  . Prostate biopsy  2013    Home Medications:    Medication List       This list is accurate as of: 05/23/15  3:42 PM.  Always use your most recent med list.               DAILY MULTIVITAMIN Caps  Take by mouth.     enalapril 5 MG tablet  Commonly known as:  VASOTEC  Take 5 mg by mouth daily.     ezetimibe 10 MG tablet  Commonly known as:  ZETIA  Take 10 mg by  mouth daily.     ferrous sulfate 325 (65 FE) MG tablet  Take 325 mg by mouth daily with breakfast.     finasteride 5 MG tablet  Commonly known as:  PROSCAR  Take 1 tablet (5 mg total) by mouth daily.     FLUZONE HIGH-DOSE 0.5 ML Susy  Generic drug:  Influenza Vac Split High-Dose  ADM 0.5ML IM UTD     Melatonin 5 MG Caps  Take 5 mg by mouth.     metoprolol-hydrochlorothiazide 50-25 MG tablet  Commonly known as:  LOPRESSOR HCT  Take 1 tablet by mouth daily.     MULTIPLE VITAMIN PO  Take by mouth daily.     omeprazole 40 MG capsule  Commonly known as:  PRILOSEC  Take 40 mg by mouth daily.     PROAIR HFA 108 (90 BASE) MCG/ACT inhaler  Generic drug:  albuterol  Inhale into the lungs.     tamsulosin 0.4 MG Caps capsule  Commonly known as:  FLOMAX  Take 1 capsule by mouth  once a day     TART CHERRY ADVANCED PO  Take by mouth daily.        Allergies: No Known Allergies  Family History: Family History  Problem Relation Age of Onset  . Kidney failure Brother     Social History:  reports that he has quit smoking. He does not have any smokeless tobacco history on file. He  reports that he does not drink alcohol. His drug history is not on file.  ROS: UROLOGY Frequent Urination?: No Hard to postpone urination?: No Burning/pain with urination?: No Get up at night to urinate?: No Leakage of urine?: No Urine stream starts and stops?: No Trouble starting stream?: No Do you have to strain to urinate?: No Blood in urine?: No Urinary tract infection?: No Sexually transmitted disease?: No Injury to kidneys or bladder?: No Painful intercourse?: No Weak stream?: No Erection problems?: No Penile pain?: No  Gastrointestinal Nausea?: No Vomiting?: No Indigestion/heartburn?: No Diarrhea?: No Constipation?: No  Constitutional Fever: No Night sweats?: No Weight loss?: No Fatigue?: No  Skin Skin rash/lesions?: No Itching?: No  Eyes Blurred vision?: No Double vision?: No  Ears/Nose/Throat Sore throat?: No Sinus problems?: No  Hematologic/Lymphatic Swollen glands?: No Easy bruising?: No  Cardiovascular Leg swelling?: No Chest pain?: No  Respiratory Cough?: No Shortness of breath?: No  Endocrine Excessive thirst?: No  Musculoskeletal Back pain?: No Joint pain?: No  Neurological Headaches?: No Dizziness?: No  Psychologic Depression?: No Anxiety?: No  Physical Exam: BP 123/70 mmHg  Pulse 83  Ht 5\' 7"  (1.702 m)  Wt 176 lb 1.6 oz (79.878 kg)  BMI 27.57 kg/m2  GU: No CVA tenderness.  No bladder fullness or masses.  Patient with uncircumcised phallus. Foreskin easily retracted  Urethral meatus is patent.  No penile discharge. No penile lesions or rashes. Scrotum without lesions, cysts, rashes and/or edema.  Testicles are located scrotally bilaterally. No masses are appreciated in the testicles. Left and right epididymis are normal. Rectal: Patient with  normal sphincter tone. Anus and perineum without scarring or rashes. No rectal masses are appreciated. Prostate is approximately 50 grams, no nodules are appreciated. Seminal  vesicles are normal.   Laboratory Data: Lab Results  Component Value Date   WBC 5.8 12/06/2013   HGB 7.3* 12/06/2013   HCT 21.6* 12/06/2013   MCV 92 12/06/2013   PLT 188 12/06/2013    Lab Results  Component Value Date   CREATININE 0.88 12/02/2013  Lab Results  Component Value Date   HGBA1C 4.8 04/11/2012   PSA History:  2.6 ng/mL on 04/25/2014  1.6 ng/mL on 11/22/2014  Urinalysis   Pertinent Imaging: Results for GEVORK, AYYAD (MRN [id number]) as of 05/23/2015 15:34  Ref. Range 05/23/2015 15:17  Scan Result Unknown 125    Assessment & Plan:     1. BPH with LUTS:   IPSS score is 0/0.  He will continue the tamsulosin and finasteride.  Refills are sent to his pharmacy.  He will RTC in 6 months for an exam, PSA and IPSS score.  - PSA - Bladder Scan (Post Void Residual) in office   Return in about 6 months (around 11/21/2015) for IPSS.  Zara Council, [facility] [address] [place], [place] [address] [phone]

## 2015-05-24 ENCOUNTER — Telehealth: Payer: Self-pay

## 2015-05-24 LAB — PSA: PROSTATE SPECIFIC AG, SERUM: 1.8 ng/mL (ref 0.0–4.0)

## 2015-05-24 NOTE — Telephone Encounter (Signed)
LMOM- labs are stable. RTC in a year.

## 2015-05-24 NOTE — Telephone Encounter (Signed)
-----   Message from Nori Riis, PA-C sent at 05/24/2015  8:37 AM EDT ----- Patient's PSA is stable.  We will see him in 12 months.  PSA to be drawn before his next appointment.

## 2015-05-28 ENCOUNTER — Other Ambulatory Visit: Payer: Self-pay

## 2015-05-28 DIAGNOSIS — N400 Enlarged prostate without lower urinary tract symptoms: Secondary | ICD-10-CM

## 2015-05-28 MED ORDER — FINASTERIDE 5 MG PO TABS: 5.0000 mg | ORAL_TABLET | Freq: Every day | ORAL | Status: DC

## 2015-07-25 ENCOUNTER — Other Ambulatory Visit: Payer: Self-pay | Admitting: Internal Medicine

## 2015-07-25 DIAGNOSIS — R1084 Generalized abdominal pain: Secondary | ICD-10-CM

## 2015-07-25 DIAGNOSIS — R102 Pelvic and perineal pain: Secondary | ICD-10-CM

## 2015-08-01 ENCOUNTER — Ambulatory Visit
Admission: RE | Admit: 2015-08-01 | Discharge: 2015-08-01 | Disposition: A | Discharge status: Home / Self Care | Payer: Medicare Other | Source: Ambulatory Visit | Attending: Internal Medicine | Admitting: Internal Medicine

## 2015-08-01 DIAGNOSIS — R1084 Generalized abdominal pain: Secondary | ICD-10-CM | POA: Insufficient documentation

## 2015-08-01 DIAGNOSIS — K5730 Diverticulosis of large intestine without perforation or abscess without bleeding: Secondary | ICD-10-CM | POA: Insufficient documentation

## 2015-08-01 DIAGNOSIS — R102 Pelvic and perineal pain: Secondary | ICD-10-CM | POA: Diagnosis present

## 2015-08-01 MED ORDER — IOHEXOL 300 MG/ML  SOLN
100.0000 mL | Freq: Once | INTRAMUSCULAR | Status: AC | PRN
  Administered 2015-08-01: 100 mL via INTRAVENOUS

## 2015-09-05 DIAGNOSIS — Z8719 Personal history of other diseases of the digestive system: Secondary | ICD-10-CM | POA: Insufficient documentation

## 2015-09-05 DIAGNOSIS — Z8711 Personal history of peptic ulcer disease: Secondary | ICD-10-CM | POA: Insufficient documentation

## 2015-09-15 ENCOUNTER — Other Ambulatory Visit: Payer: Self-pay | Admitting: Urology

## 2015-09-15 DIAGNOSIS — N400 Enlarged prostate without lower urinary tract symptoms: Secondary | ICD-10-CM

## 2015-09-30 ENCOUNTER — Other Ambulatory Visit: Payer: Self-pay

## 2015-09-30 DIAGNOSIS — N400 Enlarged prostate without lower urinary tract symptoms: Secondary | ICD-10-CM

## 2015-09-30 MED ORDER — FINASTERIDE 5 MG PO TABS: 5.0000 mg | ORAL_TABLET | Freq: Every day | ORAL | Status: DC

## 2015-11-13 ENCOUNTER — Other Ambulatory Visit: Payer: Self-pay

## 2015-11-13 DIAGNOSIS — R972 Elevated prostate specific antigen [PSA]: Secondary | ICD-10-CM

## 2015-11-14 ENCOUNTER — Other Ambulatory Visit: Payer: Medicare Other

## 2015-11-14 ENCOUNTER — Encounter: Payer: Self-pay | Admitting: Urology

## 2015-11-18 ENCOUNTER — Other Ambulatory Visit: Payer: Medicare Other

## 2015-11-18 DIAGNOSIS — R972 Elevated prostate specific antigen [PSA]: Secondary | ICD-10-CM

## 2015-11-19 LAB — PSA: PROSTATE SPECIFIC AG, SERUM: 2.3 ng/mL (ref 0.0–4.0)

## 2015-11-21 ENCOUNTER — Encounter: Payer: Self-pay | Admitting: Urology

## 2015-11-21 ENCOUNTER — Ambulatory Visit (INDEPENDENT_AMBULATORY_CARE_PROVIDER_SITE_OTHER): Payer: Medicare Other | Admitting: Urology

## 2015-11-21 VITALS — BP 133/70 | HR 67 | Ht 67.0 in | Wt 174.6 lb

## 2015-11-21 DIAGNOSIS — Z87898 Personal history of other specified conditions: Secondary | ICD-10-CM | POA: Insufficient documentation

## 2015-11-21 DIAGNOSIS — N401 Enlarged prostate with lower urinary tract symptoms: Secondary | ICD-10-CM | POA: Diagnosis not present

## 2015-11-21 DIAGNOSIS — N138 Other obstructive and reflux uropathy: Secondary | ICD-10-CM | POA: Insufficient documentation

## 2015-11-21 NOTE — Progress Notes (Signed)
3:36 PM   [facility] 1941/07/07 [id number]  Referring provider: Idelle Crouch, MD [place] [facility] [place], [place] [address]  Chief Complaint  Patient presents with  . Benign Prostatic Hypertrophy    6 month follow up  . Elevated PSA    HPI: Patient is 75 year old Caucasian male with BPH and LUTS and a history of elevated PSA that is managed with tamsulosin and finasteride who presents today for a 6 month follow up.  BPH WITH LUTS His IPSS score today is 1, which is mild lower urinary tract symptomatology. He is pleased with his quality life due to his urinary symptoms. His PVR is 125 mL.  His previous IPSS score was 0/0.  He denies any dysuria, hematuria or suprapubic pain.   He is currently taking tamsulosin 0.4 mg and finasteride 5 mg daily. His has had biopsy in 2013 for which was negative.   He also denies any recent fevers, chills, nausea or vomiting.   He does not have a family history of PCa.      IPSS      11/21/15 1500       International Prostate Symptom Score   How often have you had the sensation of not emptying your bladder? Not at All     How often have you had to urinate less than every two hours? Not at All     How often have you found you stopped and started again several times when you urinated? Not at All     How often have you found it difficult to postpone urination? Not at All     How often have you had a weak urinary stream? Not at All     How often have you had to strain to start urination? Not at All     How many times did you typically get up at night to urinate? 1 Time     Total IPSS Score 1     Quality of Life due to urinary symptoms   If you were to spend the rest of your life with your urinary condition just the way it is now how would you feel about that? Pleased        Score:  1-7 Mild 8-19 Moderate 20-35 Severe  History of elevated PSA Per patient, he had a biopsy in 2013 which was negative. The PSA at  that time is unknown. He was referred to [place] in 2015 for a PSA of 4.66.  His most recent PSA is 2.3 on 11/18/2015 which is up from 1.8 ng/mL 6 months ago.   PMH: Past Medical History  Diagnosis Date  . Hyperuricemia   . Hyperglycemia   . GERD (gastroesophageal reflux disease)   . HLD (hyperlipidemia)   . Encounter for long-term (current) use of other high-risk medications   . GI bleed   . Urinary frequency   . Anemia   . History of [facility] spotted fever   . HTN (hypertension)   . Psoriasis   . BPH with obstruction/lower urinary tract symptoms   . Obesity   . History of elevated PSA   . Incomplete bladder emptying   . BP (high blood pressure) 05/23/2015  . Gout     Surgical History: Past Surgical History  Procedure Laterality Date  . Cervical spine surgery  2007  . Prostate biopsy  2013  . Colonoscopy      lost 10 units of blood  Home Medications:    Medication List       This list is accurate as of: 11/21/15  3:36 PM.  Always use your most recent med list.               ADVAIR DISKUS 250-50 MCG/DOSE Aepb  Generic drug:  Fluticasone-Salmeterol     Clobetasol Propionate 0.05 % shampoo     PRESERVISION AREDS 2 PO  Take by mouth.     DAILY MULTIVITAMIN Caps  Take by mouth. Reported on 11/21/2015     enalapril 5 MG tablet  Commonly known as:  VASOTEC  Take 5 mg by mouth daily.     ezetimibe 10 MG tablet  Commonly known as:  ZETIA  Take 10 mg by mouth daily.     ferrous sulfate 325 (65 FE) MG tablet  Take 325 mg by mouth daily with breakfast.     finasteride 5 MG tablet  Commonly known as:  PROSCAR  Take 1 tablet by mouth  daily     finasteride 5 MG tablet  Commonly known as:  PROSCAR  Take 1 tablet (5 mg total) by mouth daily.     FLUZONE HIGH-DOSE 0.5 ML Susy  Generic drug:  Influenza Vac Split High-Dose  Reported on 11/21/2015     Melatonin 5 MG Caps  Take 5 mg by mouth.     metoprolol-hydrochlorothiazide 50-25 MG tablet  Commonly  known as:  LOPRESSOR HCT  Take 1 tablet by mouth daily.     MULTIPLE VITAMIN PO  Take by mouth daily.     omeprazole 40 MG capsule  Commonly known as:  PRILOSEC  Take 40 mg by mouth daily.     PROAIR HFA 108 (90 Base) MCG/ACT inhaler  Generic drug:  albuterol  Inhale into the lungs. Reported on 11/21/2015     tamsulosin 0.4 MG Caps capsule  Commonly known as:  FLOMAX  Take 1 capsule by mouth  once a day     TART CHERRY ADVANCED PO  Take by mouth daily.     triamcinolone ointment 0.1 %  Commonly known as:  KENALOG        Allergies: No Known Allergies  Family History: Family History  Problem Relation Age of Onset  . Kidney failure Brother   . Prostate cancer Neg Hx     Social History:  reports that he has quit smoking. He does not have any smokeless tobacco history on file. He reports that he drinks alcohol. He reports that he does not use illicit drugs.  ROS: UROLOGY Frequent Urination?: No Hard to postpone urination?: No Burning/pain with urination?: No Get up at night to urinate?: No Leakage of urine?: No Urine stream starts and stops?: No Trouble starting stream?: No Do you have to strain to urinate?: No Blood in urine?: No Urinary tract infection?: No Sexually transmitted disease?: No Injury to kidneys or bladder?: No Painful intercourse?: No Weak stream?: No Erection problems?: No Penile pain?: No  Gastrointestinal Nausea?: No Vomiting?: No Indigestion/heartburn?: No Diarrhea?: No Constipation?: No  Constitutional Fever: No Night sweats?: No Weight loss?: No Fatigue?: No  Skin Skin rash/lesions?: No Itching?: No  Eyes Blurred vision?: Yes Double vision?: No  Ears/Nose/Throat Sore throat?: No Sinus problems?: No  Hematologic/Lymphatic Swollen glands?: No Easy bruising?: No  Cardiovascular Leg swelling?: No Chest pain?: No  Respiratory Cough?: No Shortness of breath?: No  Endocrine Excessive thirst?:  No  Musculoskeletal Back pain?: Yes Joint pain?: Yes  Neurological Headaches?: No  Dizziness?: No  Psychologic Depression?: No Anxiety?: No  Physical Exam: BP 133/70 mmHg  Pulse 67  Ht 5\' 7"  (1.702 m)  Wt 174 lb 9.6 oz (79.198 kg)  BMI 27.34 kg/m2  GU: No CVA tenderness.  No bladder fullness or masses.  Patient with uncircumcised phallus. Foreskin easily retracted  Urethral meatus is patent.  No penile discharge. No penile lesions or rashes. Scrotum without lesions, cysts, rashes and/or edema.  Testicles are located scrotally bilaterally. No masses are appreciated in the testicles. Left and right epididymis are normal. Rectal: Patient with  normal sphincter tone. Anus and perineum without scarring or rashes. No rectal masses are appreciated. Prostate is approximately 50 grams, no nodules are appreciated. Seminal vesicles are normal.   Laboratory Data: Lab Results  Component Value Date   WBC 5.8 12/06/2013   HGB 7.3* 12/06/2013   HCT 21.6* 12/06/2013   MCV 92 12/06/2013   PLT 188 12/06/2013    Lab Results  Component Value Date   CREATININE 0.88 12/02/2013    Lab Results  Component Value Date   HGBA1C 4.8 04/11/2012   PSA History:  2.6 ng/mL on 04/25/2014  1.6 ng/mL on 11/22/2014  1.8 ng/mL on 05/23/2015  2.3 ng/mL on 11/18/2015     1. BPH with LUTS:   IPSS score is 1/1.  He will continue the tamsulosin and finasteride.  Refills are sent to his pharmacy.  PSA is redrawn today. If it returns stable,  he will RTC in 6 months for an exam, PSA and IPSS score.  - PSA  2. History of elevated PSA:   Per patient, he had a biopsy in 2013 which was negative. The PSA at that time is unknown. He was referred to [place] in 2015 for a PSA of 4.66.  His most recent PSA is 2.3 on 11/18/2015 which is up from 1.8 ng/mL 6 months ago.  PSA is redrawn today.  Follow-up is pending those results.  Return in about 6 months (around 05/22/2016) for IPSS and exam.  Zara Council,  [facility] [address], [place], [place] [address] [phone]

## 2015-11-22 LAB — PSA: PROSTATE SPECIFIC AG, SERUM: 2.3 ng/mL (ref 0.0–4.0)

## 2015-11-28 ENCOUNTER — Telehealth: Payer: Self-pay

## 2015-11-28 DIAGNOSIS — R972 Elevated prostate specific antigen [PSA]: Secondary | ICD-10-CM

## 2015-11-28 NOTE — Telephone Encounter (Signed)
Spoke with pt in reference to PSA results. Pt voiced understanding. Pt will RTC in August for lab draw. Orders placed.

## 2015-11-28 NOTE — Telephone Encounter (Signed)
LMOM

## 2015-11-28 NOTE — Telephone Encounter (Signed)
-----   Message from Nori Riis, PA-C sent at 11/25/2015  1:47 PM EDT ----- PSA did not come down.  I would like it repeated in 3 months.

## 2015-12-14 ENCOUNTER — Other Ambulatory Visit: Payer: Self-pay | Admitting: Urology

## 2016-03-05 ENCOUNTER — Other Ambulatory Visit: Payer: Self-pay | Admitting: Urology

## 2016-03-10 ENCOUNTER — Other Ambulatory Visit: Payer: Medicare Other

## 2016-03-10 DIAGNOSIS — R972 Elevated prostate specific antigen [PSA]: Secondary | ICD-10-CM

## 2016-03-11 ENCOUNTER — Telehealth: Payer: Self-pay

## 2016-03-11 LAB — PSA: PROSTATE SPECIFIC AG, SERUM: 2 ng/mL (ref 0.0–4.0)

## 2016-03-11 NOTE — Progress Notes (Signed)
PSA is stable.  We will see him in October.

## 2016-03-11 NOTE — Telephone Encounter (Signed)
Number has been disconnected. Will send a letter.  Called wife who requested to call pt.

## 2016-03-11 NOTE — Telephone Encounter (Signed)
-----   Message from Nori Riis, PA-C sent at 03/11/2016 10:28 AM EDT ----- Please notify the patient that his PSA is stable.  We will see him in October.

## 2016-05-08 ENCOUNTER — Other Ambulatory Visit: Payer: Self-pay

## 2016-05-08 DIAGNOSIS — N400 Enlarged prostate without lower urinary tract symptoms: Secondary | ICD-10-CM

## 2016-05-08 MED ORDER — FINASTERIDE 5 MG PO TABS: 5.0000 mg | ORAL_TABLET | Freq: Every day | ORAL | 0 refills | Status: DC

## 2016-05-19 ENCOUNTER — Other Ambulatory Visit: Payer: Medicare Other

## 2016-05-23 ENCOUNTER — Other Ambulatory Visit: Payer: Self-pay | Admitting: Urology

## 2016-05-26 ENCOUNTER — Ambulatory Visit (INDEPENDENT_AMBULATORY_CARE_PROVIDER_SITE_OTHER): Payer: Medicare Other | Admitting: Urology

## 2016-05-26 ENCOUNTER — Encounter: Payer: Self-pay | Admitting: Urology

## 2016-05-26 VITALS — BP 113/70 | HR 80 | Ht 67.0 in | Wt 181.0 lb

## 2016-05-26 DIAGNOSIS — N401 Enlarged prostate with lower urinary tract symptoms: Secondary | ICD-10-CM | POA: Diagnosis not present

## 2016-05-26 DIAGNOSIS — N138 Other obstructive and reflux uropathy: Secondary | ICD-10-CM | POA: Diagnosis not present

## 2016-05-26 DIAGNOSIS — Z87898 Personal history of other specified conditions: Secondary | ICD-10-CM

## 2016-05-26 NOTE — Progress Notes (Signed)
3:29 PM   [facility] 03/14/1941 [id number]  Referring provider: Idelle Crouch, MD [place] [facility] [place], [place] [address]  Chief Complaint  Patient presents with  . Follow-up    BPH    HPI: Patient is 75 year old Caucasian male with BPH and LUTS and a history of elevated PSA that is managed with tamsulosin and finasteride who presents today for a 6 month follow up.  BPH WITH LUTS His IPSS score today is 2, which is mild lower urinary tract symptomatology. He is pleased with his quality life due to his urinary symptoms.  His previous IPSS score was 1/1.  His previous PVR was 125 mL.   He has no urinary complaints at this time.  He denies any dysuria, hematuria or suprapubic pain.   He is currently taking tamsulosin 0.4 mg and finasteride 5 mg daily. His has had biopsy in 2013 for which was negative.   He also denies any recent fevers, chills, nausea or vomiting.   He does not have a family history of PCa.      IPSS    Row Name 05/26/16 1500         International Prostate Symptom Score   How often have you had the sensation of not emptying your bladder? Not at All     How often have you had to urinate less than every two hours? Less than 1 in 5 times     How often have you found you stopped and started again several times when you urinated? Not at All     How often have you found it difficult to postpone urination? Not at All     How often have you had a weak urinary stream? Not at All     How often have you had to strain to start urination? Not at All     How many times did you typically get up at night to urinate? 1 Time     Total IPSS Score 2       Quality of Life due to urinary symptoms   If you were to spend the rest of your life with your urinary condition just the way it is now how would you feel about that? Pleased        Score:  1-7 Mild 8-19 Moderate 20-35 Severe  History of elevated PSA Per patient, he had a biopsy in 2013  which was negative.  The PSA at that time is unknown. He was referred to [place] in 2015 for a PSA of 4.66.  His most recent PSA is 2.0 ng/mL on 03/10/2016.     PMH: Past Medical History:  Diagnosis Date  . Anemia   . BP (high blood pressure) 05/23/2015  . BPH with obstruction/lower urinary tract symptoms   . Encounter for long-term (current) use of other high-risk medications   . GERD (gastroesophageal reflux disease)   . GI bleed   . Gout   . History of elevated PSA   . History of [facility] spotted fever   . HLD (hyperlipidemia)   . HTN (hypertension)   . Hyperglycemia   . Hyperuricemia   . Incomplete bladder emptying   . Obesity   . Psoriasis   . Urinary frequency     Surgical History: Past Surgical History:  Procedure Laterality Date  . CERVICAL SPINE SURGERY  2007  . COLONOSCOPY     lost 10 units of blood  . PROSTATE BIOPSY  2013    Home Medications:    Medication List       Accurate as of 05/26/16  3:29 PM. Always use your most recent med list.          Adalimumab 40 MG/0.8ML Pnkt Inject 40 mg into the skin.   ADVAIR DISKUS 250-50 MCG/DOSE Aepb Generic drug:  Fluticasone-Salmeterol   albuterol 108 (90 Base) MCG/ACT inhaler Commonly known as:  PROVENTIL HFA;VENTOLIN HFA Inhale into the lungs.   clobetasol ointment 0.05 % Commonly known as:  TEMOVATE Apply twice daily to thicker areas of psoriasis   DOCOSAHEXAENOIC ACID PO Take by mouth.   enalapril 5 MG tablet Commonly known as:  VASOTEC   ezetimibe 10 MG tablet Commonly known as:  ZETIA Take 1 tablet by mouth once a day   ferrous sulfate 325 (65 FE) MG tablet Take 325 mg by mouth daily with breakfast.   finasteride 5 MG tablet Commonly known as:  PROSCAR Take 1 tablet (5 mg total) by mouth daily.   FLUZONE HIGH-DOSE 0.5 ML Susy Generic drug:  Influenza Vac Split High-Dose Reported on 11/21/2015   LYCOPENE PO Take by mouth.   Melatonin 5 MG Caps Take 5 mg by mouth.     metoprolol-hydrochlorothiazide 50-25 MG tablet Commonly known as:  LOPRESSOR HCT   MULTIPLE VITAMIN PO Take by mouth daily.   MULTI-VITAMINS Tabs Take by mouth.   neomycin-polymyxin-hydrocortisone 3.5-10000-1 otic suspension Commonly known as:  CORTISPORIN   omeprazole 40 MG capsule Commonly known as:  PRILOSEC   tamsulosin 0.4 MG Caps capsule Commonly known as:  FLOMAX   TART CHERRY ADVANCED PO Take by mouth daily.   triamcinolone ointment 0.1 % Commonly known as:  KENALOG Apply twice daily to thinner areas of psoriasis       Allergies: No Known Allergies  Family History: Family History  Problem Relation Age of Onset  . Kidney failure Brother   . Prostate cancer Neg Hx     Social History:  reports that he has quit smoking. He does not have any smokeless tobacco history on file. He reports that he drinks alcohol. He reports that he does not use drugs.  ROS: UROLOGY Frequent Urination?: No Hard to postpone urination?: No Burning/pain with urination?: No Get up at night to urinate?: No Leakage of urine?: No Urine stream starts and stops?: No Trouble starting stream?: No Do you have to strain to urinate?: No Blood in urine?: No Urinary tract infection?: No Sexually transmitted disease?: No Injury to kidneys or bladder?: No Painful intercourse?: No Weak stream?: No Erection problems?: No Penile pain?: No  Gastrointestinal Nausea?: No Vomiting?: No Indigestion/heartburn?: No Diarrhea?: No Constipation?: No  Constitutional Fever: No Night sweats?: No Weight loss?: No Fatigue?: No  Skin Skin rash/lesions?: No Itching?: No  Eyes Blurred vision?: No Double vision?: No  Ears/Nose/Throat Sore throat?: No Sinus problems?: No  Hematologic/Lymphatic Swollen glands?: No Easy bruising?: No  Cardiovascular Leg swelling?: No Chest pain?: No  Respiratory Cough?: No Shortness of breath?: No  Endocrine Excessive thirst?:  No  Musculoskeletal Back pain?: No Joint pain?: No  Neurological Headaches?: No Dizziness?: No  Psychologic Depression?: No Anxiety?: No  Physical Exam: BP 113/70   Pulse 80   Ht 5\' 7"  (1.702 m)   Wt 181 lb (82.1 kg)   BMI 28.35 kg/m   GU: No CVA tenderness.  No bladder fullness or masses.  Patient with uncircumcised phallus. Foreskin easily retracted  Urethral meatus is patent.  No penile discharge.  No penile lesions or rashes. Scrotum without lesions, cysts, rashes and/or edema.  Testicles are located scrotally bilaterally. No masses are appreciated in the testicles. Left and right epididymis are normal. Rectal: Patient with  normal sphincter tone. Anus and perineum without scarring or rashes. No rectal masses are appreciated. Prostate is approximately 50 grams, no nodules are appreciated. Seminal vesicles are normal.   Laboratory Data: Lab Results  Component Value Date   WBC 5.8 12/06/2013   HGB 7.3 (L) 12/06/2013   HCT 21.6 (L) 12/06/2013   MCV 92 12/06/2013   PLT 188 12/06/2013    Lab Results  Component Value Date   CREATININE 0.88 12/02/2013    Lab Results  Component Value Date   HGBA1C 4.8 04/11/2012   PSA History:  2.6 ng/mL on 04/25/2014  1.6 ng/mL on 11/22/2014  1.8 ng/mL on 05/23/2015  2.3 ng/mL on 11/18/2015  2.0 ng/mL on 03/10/2016     1. BPH with LUTS  - IPSS score is 2/1, it is stable worsening  - Continue conservative management, avoiding bladder irritants and timed voiding's  - Continue tamsulosin 0.4 mg daily and finasteride 5 mg daily, Refills given  - RTC in 6 months for IPSS, PSA and exam   2. History of elevated PSA  - Current PSA is 2.0  - Return to clinic in 6 months for PSA and exam  Return in about 6 months (around 11/23/2016) for IPSS, PSA and exam.  Zara Council, [facility]  [place] [address] [place], [place] [address] [phone]

## 2016-07-05 ENCOUNTER — Other Ambulatory Visit: Payer: Self-pay | Admitting: Urology

## 2016-08-13 ENCOUNTER — Other Ambulatory Visit: Payer: Self-pay | Admitting: Urology

## 2016-08-13 DIAGNOSIS — N400 Enlarged prostate without lower urinary tract symptoms: Secondary | ICD-10-CM

## 2016-09-10 ENCOUNTER — Other Ambulatory Visit: Payer: Self-pay | Admitting: Urology

## 2016-09-10 DIAGNOSIS — N400 Enlarged prostate without lower urinary tract symptoms: Secondary | ICD-10-CM

## 2016-10-13 ENCOUNTER — Encounter: Payer: Self-pay | Admitting: *Deleted

## 2016-10-14 ENCOUNTER — Ambulatory Visit
Admission: RE | Admit: 2016-10-14 | Discharge: 2016-10-14 | Disposition: A | Discharge status: Home / Self Care | Payer: Medicare Other | Source: Ambulatory Visit | Attending: Unknown Physician Specialty | Admitting: Unknown Physician Specialty

## 2016-10-14 ENCOUNTER — Encounter: Payer: Self-pay | Admitting: *Deleted

## 2016-10-14 ENCOUNTER — Ambulatory Visit: Payer: Medicare Other | Admitting: Anesthesiology

## 2016-10-14 ENCOUNTER — Encounter
Admission: RE | Disposition: A | Discharge status: Home / Self Care | Payer: Self-pay | Source: Ambulatory Visit | Attending: Unknown Physician Specialty

## 2016-10-14 DIAGNOSIS — Z87891 Personal history of nicotine dependence: Secondary | ICD-10-CM | POA: Insufficient documentation

## 2016-10-14 DIAGNOSIS — K2970 Gastritis, unspecified, without bleeding: Secondary | ICD-10-CM | POA: Diagnosis not present

## 2016-10-14 DIAGNOSIS — L409 Psoriasis, unspecified: Secondary | ICD-10-CM | POA: Insufficient documentation

## 2016-10-14 DIAGNOSIS — Z9049 Acquired absence of other specified parts of digestive tract: Secondary | ICD-10-CM | POA: Diagnosis not present

## 2016-10-14 DIAGNOSIS — J449 Chronic obstructive pulmonary disease, unspecified: Secondary | ICD-10-CM | POA: Diagnosis not present

## 2016-10-14 DIAGNOSIS — D649 Anemia, unspecified: Secondary | ICD-10-CM | POA: Diagnosis not present

## 2016-10-14 DIAGNOSIS — E785 Hyperlipidemia, unspecified: Secondary | ICD-10-CM | POA: Insufficient documentation

## 2016-10-14 DIAGNOSIS — Z841 Family history of disorders of kidney and ureter: Secondary | ICD-10-CM | POA: Diagnosis not present

## 2016-10-14 DIAGNOSIS — R0600 Dyspnea, unspecified: Secondary | ICD-10-CM | POA: Diagnosis not present

## 2016-10-14 DIAGNOSIS — Z8619 Personal history of other infectious and parasitic diseases: Secondary | ICD-10-CM | POA: Diagnosis not present

## 2016-10-14 DIAGNOSIS — E669 Obesity, unspecified: Secondary | ICD-10-CM | POA: Diagnosis not present

## 2016-10-14 DIAGNOSIS — I10 Essential (primary) hypertension: Secondary | ICD-10-CM | POA: Insufficient documentation

## 2016-10-14 DIAGNOSIS — M109 Gout, unspecified: Secondary | ICD-10-CM | POA: Diagnosis not present

## 2016-10-14 DIAGNOSIS — K219 Gastro-esophageal reflux disease without esophagitis: Secondary | ICD-10-CM | POA: Insufficient documentation

## 2016-10-14 DIAGNOSIS — Z79899 Other long term (current) drug therapy: Secondary | ICD-10-CM | POA: Diagnosis not present

## 2016-10-14 DIAGNOSIS — G8929 Other chronic pain: Secondary | ICD-10-CM | POA: Insufficient documentation

## 2016-10-14 DIAGNOSIS — R1013 Epigastric pain: Secondary | ICD-10-CM | POA: Diagnosis present

## 2016-10-14 HISTORY — DX: Other specified postprocedural states: Z98.890

## 2016-10-14 HISTORY — DX: Dyspnea, unspecified: R06.00

## 2016-10-14 HISTORY — PX: ESOPHAGOGASTRODUODENOSCOPY: SHX5428

## 2016-10-14 HISTORY — DX: Personal history of other diseases of the digestive system: Z87.19

## 2016-10-14 HISTORY — DX: Chronic obstructive pulmonary disease, unspecified: J44.9

## 2016-10-14 LAB — GLUCOSE, CAPILLARY: GLUCOSE-CAPILLARY: 101 mg/dL — AB (ref 65–99)

## 2016-10-14 SURGERY — EGD (ESOPHAGOGASTRODUODENOSCOPY)
Anesthesia: General

## 2016-10-14 MED ORDER — GLYCOPYRROLATE 0.2 MG/ML IJ SOLN
INTRAMUSCULAR | Status: DC | PRN
  Administered 2016-10-14: 0.2 mg via INTRAVENOUS

## 2016-10-14 MED ORDER — SODIUM CHLORIDE 0.9 % IV SOLN
INTRAVENOUS | Status: DC
Start: 2016-10-14 — End: 2016-10-14

## 2016-10-14 MED ORDER — PROPOFOL 10 MG/ML IV BOLUS
INTRAVENOUS | Status: DC | PRN
  Administered 2016-10-14: 70 mg via INTRAVENOUS

## 2016-10-14 MED ORDER — SODIUM CHLORIDE 0.9 % IV SOLN
INTRAVENOUS | Status: DC
  Administered 2016-10-14: 1000 mL via INTRAVENOUS

## 2016-10-14 MED ORDER — LIDOCAINE 2% (20 MG/ML) 5 ML SYRINGE
INTRAMUSCULAR | Status: DC | PRN
  Administered 2016-10-14: 100 mg via INTRAVENOUS

## 2016-10-14 MED ORDER — PROPOFOL 500 MG/50ML IV EMUL
INTRAVENOUS | Status: DC | PRN
  Administered 2016-10-14: 200 ug/kg/min via INTRAVENOUS

## 2016-10-14 MED ORDER — PROPOFOL 500 MG/50ML IV EMUL
INTRAVENOUS | Status: AC
  Filled 2016-10-14: qty 50

## 2016-10-14 NOTE — Anesthesia Preprocedure Evaluation (Signed)
Anesthesia Evaluation  Patient identified by MRN, date of birth, ID band Patient awake    Reviewed: Allergy & Precautions, NPO status , Patient's Chart, lab work & pertinent test results  Airway Mallampati: II       Dental  (+) Teeth Intact   Pulmonary COPD, former smoker,     + decreased breath sounds      Cardiovascular Exercise Tolerance: Good hypertension, Pt. on home beta blockers  Rhythm:Regular     Neuro/Psych    GI/Hepatic Neg liver ROS, GERD  Medicated,  Endo/Other  negative endocrine ROS  Renal/GU negative Renal ROS     Musculoskeletal   Abdominal Normal abdominal exam  (+)   Peds negative pediatric ROS (+)  Hematology negative hematology ROS (+) anemia ,   Anesthesia Other Findings   Reproductive/Obstetrics                             Anesthesia Physical Anesthesia Plan  ASA: II  Anesthesia Plan: General   Post-op Pain Management:    Induction: Intravenous  Airway Management Planned: Natural Airway and Nasal Cannula  Additional Equipment:   Intra-op Plan:   Post-operative Plan:   Informed Consent: I have reviewed the patients History and Physical, chart, labs and discussed the procedure including the risks, benefits and alternatives for the proposed anesthesia with the patient or authorized representative who has indicated his/her understanding and acceptance.     Plan Discussed with: CRNA  Anesthesia Plan Comments:         Anesthesia Quick Evaluation

## 2016-10-14 NOTE — H&P (Signed)
Primary Care Physician:  Idelle Crouch, MD Primary Gastroenterologist:  Dr. Vira Agar  Pre-Procedure History & Physical: HPI:  Brian Callahan is a 76 y.o. male is here for an endoscopy.   Past Medical History:  Diagnosis Date  . Anemia   . BP (high blood pressure) 05/23/2015  . BPH with obstruction/lower urinary tract symptoms   . COPD (chronic obstructive pulmonary disease) ([place])   . Dyspnea   . Encounter for long-term (current) use of other high-risk medications   . GERD (gastroesophageal reflux disease)   . GI bleed   . Gout   . H/O hernia repair mid-90s   x2  . History of elevated PSA   . [facility] of [facility] spotted fever   . HLD (hyperlipidemia)   . HTN (hypertension)   . Hyperglycemia   . Hyperuricemia   . Incomplete bladder emptying   . Obesity   . Psoriasis   . Psoriasis   . Urinary frequency     Past Surgical History:  Procedure Laterality Date  . BACK SURGERY     neck and spine 2007  . CERVICAL SPINE SURGERY  2007  . COLECTOMY  1998   partial, due to obstruction  . COLONOSCOPY     lost 10 units of blood  . HERNIA REPAIR  mid-90s   x2  . PROSTATE BIOPSY  2013  . SURGERY SCROTAL / TESTICULAR     for fibrous growth    Prior to Admission medications   Medication Sig Start Date End Date Taking? Authorizing Provider  albuterol (PROVENTIL HFA;VENTOLIN HFA) 108 (90 Base) MCG/ACT inhaler Inhale into the lungs. 07/17/14  Yes Historical Provider, MD  clobetasol ointment (TEMOVATE) 0.05 % Apply twice daily to thicker areas of psoriasis 10/01/15  Yes Historical Provider, MD  DOCOSAHEXAENOIC ACID PO Take by mouth.   Yes Historical Provider, MD  enalapril (VASOTEC) 5 MG tablet  07/28/15  Yes Historical Provider, MD  ezetimibe (ZETIA) 10 MG tablet Take 1 tablet by mouth once a day 03/05/16  Yes Historical Provider, MD  ferrous sulfate 325 (65 FE) MG tablet Take 325 mg by mouth daily with breakfast.   Yes Historical Provider, MD  finasteride (PROSCAR) 5 MG  tablet TAKE 1 TABLET BY MOUTH  DAILY 09/10/16  Yes Nori Riis, PA-C  Fluticasone-Salmeterol (ADVAIR DISKUS) 250-50 MCG/DOSE AEPB  07/28/15  Yes Historical Provider, MD  LYCOPENE PO Take by mouth. 06/08/08  Yes Historical Provider, MD  Melatonin 5 MG CAPS Take 5 mg by mouth.   Yes Historical Provider, MD  metoprolol-hydrochlorothiazide (LOPRESSOR HCT) 50-25 MG tablet  07/28/15  Yes Historical Provider, MD  Misc Natural Products (TART CHERRY ADVANCED PO) Take by mouth daily.   Yes Historical Provider, MD  Multiple Vitamin (MULTI-VITAMINS) TABS Take by mouth.   Yes Historical Provider, MD  MULTIPLE VITAMIN PO Take by mouth daily.   Yes Historical Provider, MD  Multiple Vitamins-Minerals (PRESERVISION AREDS 2 PO) Take by mouth daily.   Yes Historical Provider, MD  neomycin-polymyxin-hydrocortisone (CORTISPORIN) 3.5-10000-1 otic suspension  03/17/16  Yes Historical Provider, MD  omeprazole (PRILOSEC) 40 MG capsule  09/11/15  Yes Historical Provider, MD  tamsulosin (FLOMAX) 0.4 MG CAPS capsule TAKE 1 CAPSULE BY MOUTH  ONCE A DAY 07/05/16  Yes Shannon A McGowan, PA-C  triamcinolone ointment (KENALOG) 0.1 % Apply twice daily to thinner areas of psoriasis 10/01/15  Yes Historical Provider, MD  Adalimumab 40 MG/0.8ML PNKT Inject 40 mg into the skin. 01/09/16   Historical Provider, MD  FLUZONE HIGH-DOSE 0.5 ML SUSY Reported on 11/21/2015 04/12/15   Historical Provider, MD    Allergies as of 09/15/2016  . (No Known Allergies)    Family History  Problem Relation Age of Onset  . Kidney failure Brother   . Prostate cancer Neg Hx     Social History   Social History  . Marital status: Married    Spouse name: N/A  . Number of children: N/A  . Years of education: N/A   Occupational History  . Not on file.   Social History Main Topics  . Smoking status: Former Research scientist (life sciences)  . Smokeless tobacco: Never Used     Comment: Quit 20 years ago  . Alcohol use 0.0 oz/week     Comment: occ  . Drug use: No  .  Sexual activity: Not on file   Other Topics Concern  . Not on file   Social History Narrative  . No narrative on file    Review of Systems: See HPI, otherwise negative ROS  Physical Exam: BP 140/73   Pulse 61   Temp (!) 96.5 F (35.8 C) (Tympanic)   Resp 20   SpO2 100%  General:   Alert,  pleasant and cooperative in NAD Head:  Normocephalic and atraumatic. Neck:  Supple; no masses or thyromegaly. Lungs:  Clear throughout to auscultation.    Heart:  Regular rate and rhythm. Abdomen:  Soft, nontender and nondistended. Normal bowel sounds, without guarding, and without rebound.   Neurologic:  Alert and  oriented x4;  grossly normal neurologically.  Impression/Plan: Brian Callahan is here for an endoscopy to be performed for epigastric abdominal pain  Risks, benefits, limitations, and alternatives regarding  endoscopy have been reviewed with the patient.  Questions have been answered.  All parties agreeable.   Gaylyn Cheers, MD  10/14/2016, 1:43 PM

## 2016-10-14 NOTE — Anesthesia Post-op Follow-up Note (Cosign Needed)
Anesthesia QCDR form completed.        

## 2016-10-14 NOTE — Op Note (Signed)
[facility] Gastroenterology Patient Name: Brian Callahan Procedure Date: 10/14/2016 1:26 PM MRN: [id number] Account #: [id number] Date of Birth: 08-Aug-1940 Admit Type: Outpatient Age: 76 Room: [facility] ENDO ROOM 4 Gender: Male Note Status: Finalized Procedure:            Upper GI endoscopy Indications:          Epigastric abdominal pain Providers:            [name], MD Referring MD:         Leonie Douglas. Doy Hutching, MD (Referring MD) Medicines:            Propofol per Anesthesia Complications:        No immediate complications. Procedure:            Pre-Anesthesia Assessment:                       - After reviewing the risks and benefits, the patient                        was deemed in satisfactory condition to undergo the                        procedure.                       After obtaining informed consent, the endoscope was                        passed under direct vision. Throughout the procedure,                        the patient's blood pressure, pulse, and oxygen                        saturations were monitored continuously. The Endoscope                        was introduced through the mouth, and advanced to the                        second part of duodenum. The upper GI endoscopy was                        accomplished without difficulty. The patient tolerated                        the procedure well. Findings:      The examined esophagus was normal.      Localized mildly erythematous mucosa without bleeding was found at the       gastroesophageal junction. Biopsies were taken with a cold forceps for       histology.      Diffuse patchs of mildly erythematous mucosa without bleeding was found       in the gastric antrum. Biopsies were taken with a cold forceps for       histology.      The examined duodenum was normal. Impression:           - Normal esophagus.                       - Erythematous mucosa in  the gastroesophageal junction.                    Biopsied.                       - Erythematous mucosa in the antrum. Biopsied.                       - Normal examined duodenum. Recommendation:       - Await pathology results. [name], MD 10/14/2016 1:57:51 PM This report has been signed electronically. Number of Addenda: 0 Note Initiated On: 10/14/2016 1:26 PM      [facility]

## 2016-10-14 NOTE — Transfer of Care (Signed)
Immediate Anesthesia Transfer of Care Note  Patient: Brian Callahan  Procedure(s) Performed: Procedure(s): ESOPHAGOGASTRODUODENOSCOPY (EGD) (N/A)  Patient Location: Endoscopy Unit  Anesthesia Type:General  Level of Consciousness: awake  Airway & Oxygen Therapy: Patient connected to nasal cannula oxygen  Post-op Assessment: Post -op Vital signs reviewed and stable  Post vital signs: stable  Last Vitals:  Vitals:   10/14/16 1400 10/14/16 1401  BP: 132/80 132/80  Pulse: 91 90  Resp: (!) 9 18  Temp: (!) 36 C (!) 35.8 C    Last Pain:  Vitals:   10/14/16 1401  TempSrc: Tympanic      Patients Stated Pain Goal: 0 (15/52/08 0223)  Complications: No apparent anesthesia complications

## 2016-10-14 NOTE — Anesthesia Postprocedure Evaluation (Signed)
Anesthesia Post Note  Patient: Brian Callahan  Procedure(s) Performed: Procedure(s) (LRB): ESOPHAGOGASTRODUODENOSCOPY (EGD) (N/A)  Patient location during evaluation: PACU Anesthesia Type: General Level of consciousness: awake Pain management: pain level controlled Vital Signs Assessment: post-procedure vital signs reviewed and stable Respiratory status: spontaneous breathing Cardiovascular status: stable Anesthetic complications: no     Last Vitals:  Vitals:   10/14/16 1400 10/14/16 1401  BP: 132/80 132/80  Pulse: 91 90  Resp: (!) 9 18  Temp: (!) 36 C (!) 35.8 C    Last Pain:  Vitals:   10/14/16 1401  TempSrc: Tympanic                 VAN STAVEREN,Tifany Hirsch

## 2016-10-15 ENCOUNTER — Encounter: Payer: Self-pay | Admitting: Unknown Physician Specialty

## 2016-10-16 LAB — SURGICAL PATHOLOGY

## 2016-10-23 IMAGING — CT CT ABD-PELV W/ CM
2 of 5 series · 15 of 46 positions shown, 17 images · IV contrast (omnipaque)
Comparison: None.

CLINICAL DATA: Pelvic pain for several months. History of GI bleed
in 3115. History of appendectomy, hernia repair, and partial colonic
resection.

EXAM:
CT ABDOMEN AND PELVIS WITH CONTRAST
TECHNIQUE: Multidetector CT imaging of the abdomen and pelvis was performed
following the standard protocol with IV contrast.
CONTRAST:  100 mL of Omnipaque 300

[Series 2: routine with · axial · 0.75mm/px · z∈[-1269,-834]mm · 12 of 99 slices shown, 14 images]
[im 6/99  soft-tissue]
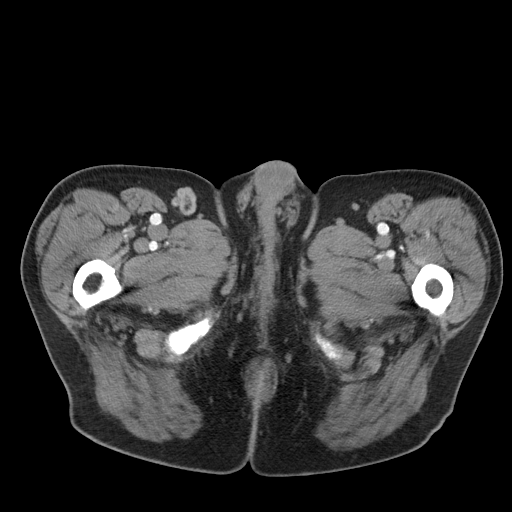
[im 6/99  bone]
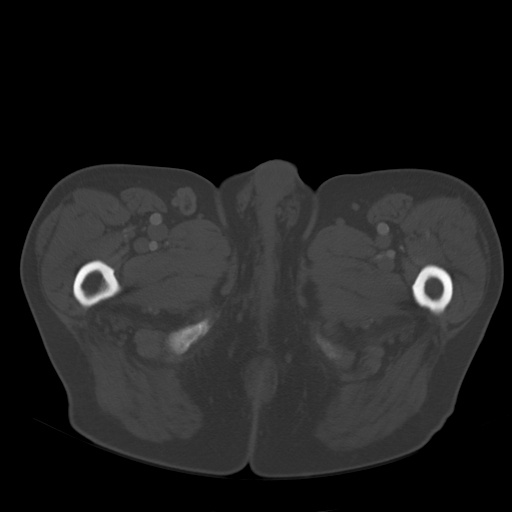
[im 17/99  soft-tissue]
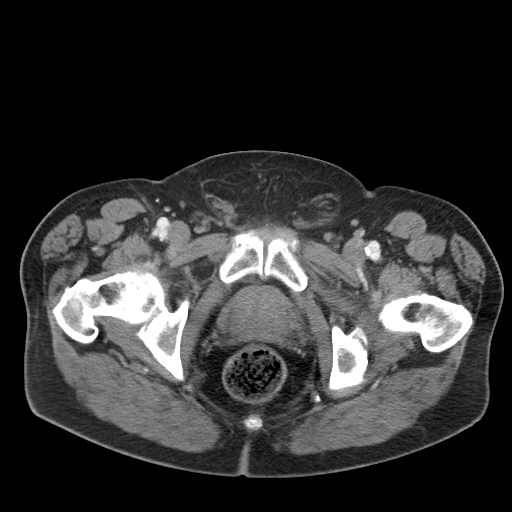
[im 22/99  soft-tissue]
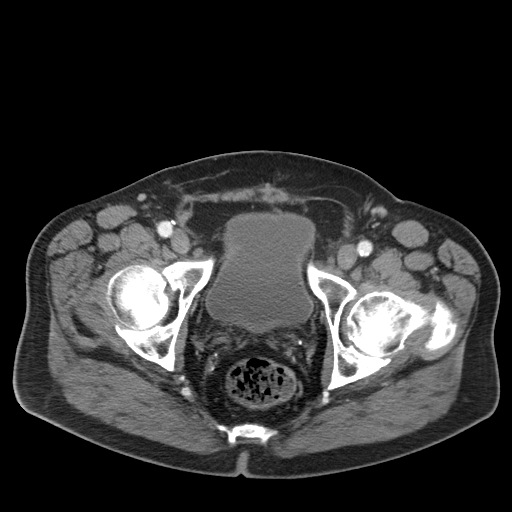
[im 28/99  soft-tissue]
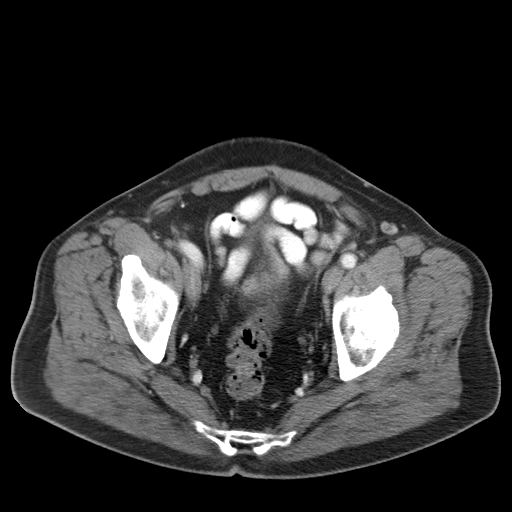
[im 39/99  soft-tissue]
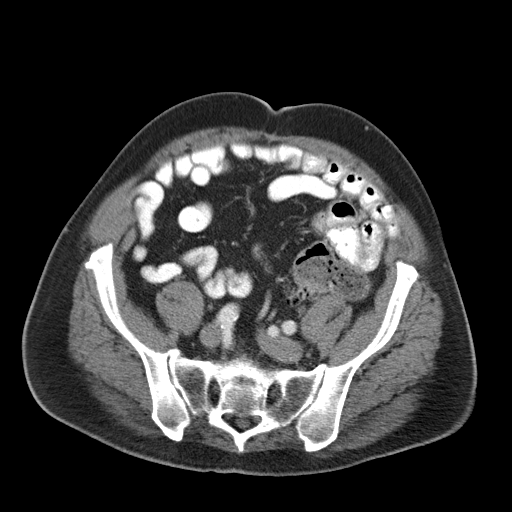
[im 44/99  soft-tissue]
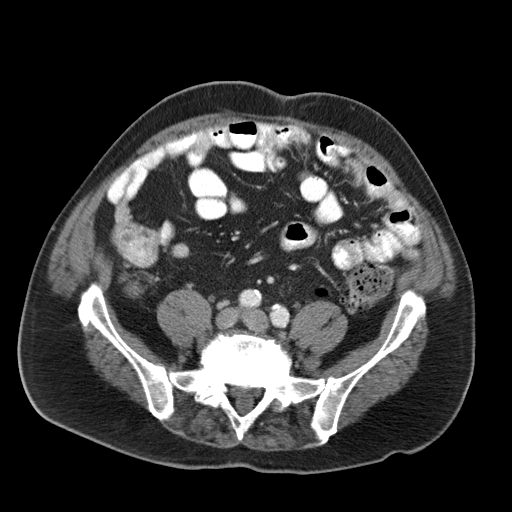
[im 55/99  soft-tissue]
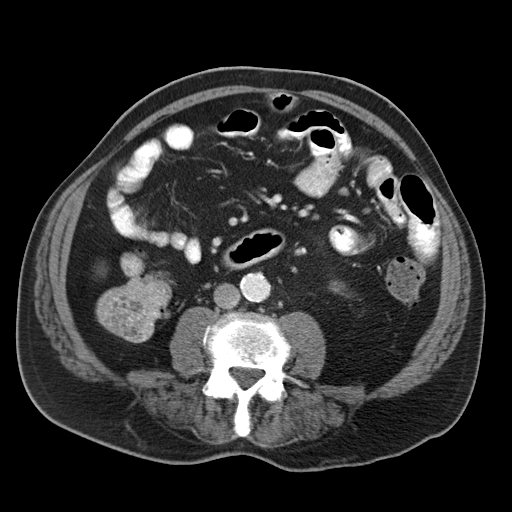
[im 60/99  soft-tissue]
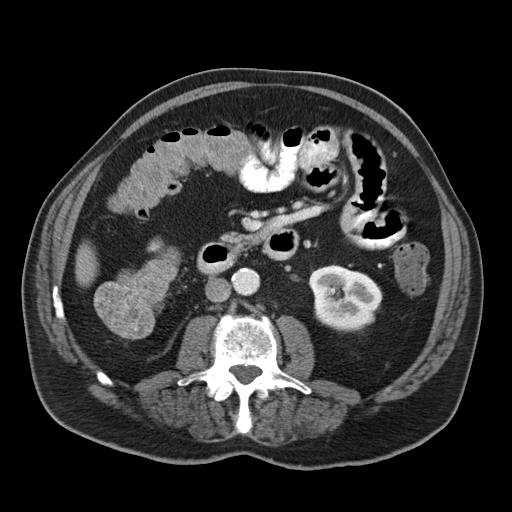
[im 71/99  soft-tissue]
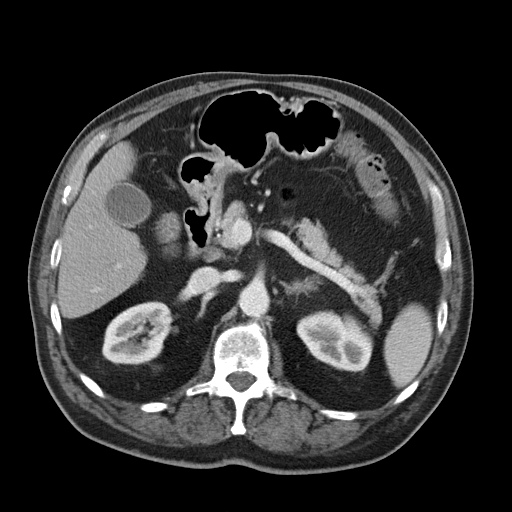
[im 71/99  bone]
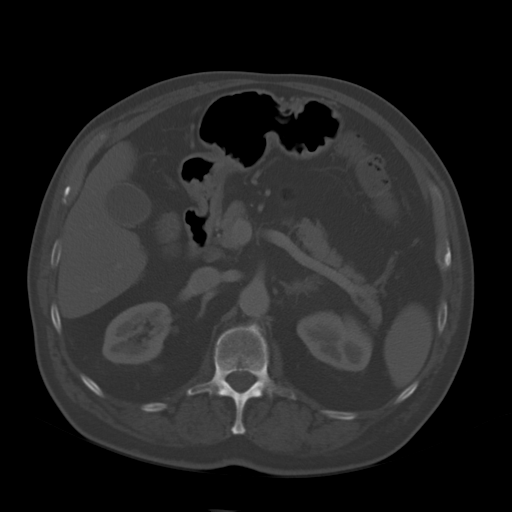
[im 77/99  soft-tissue]
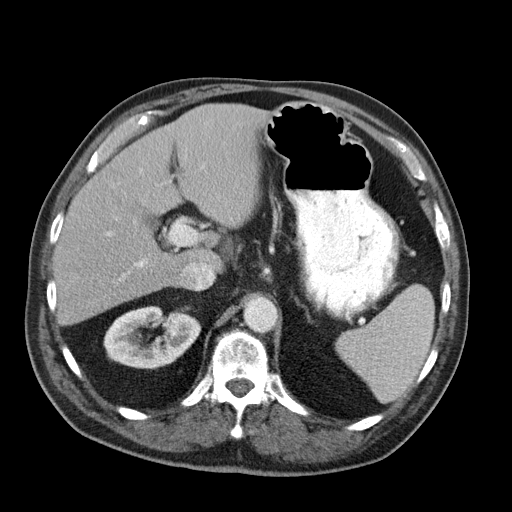
[im 82/99  soft-tissue]
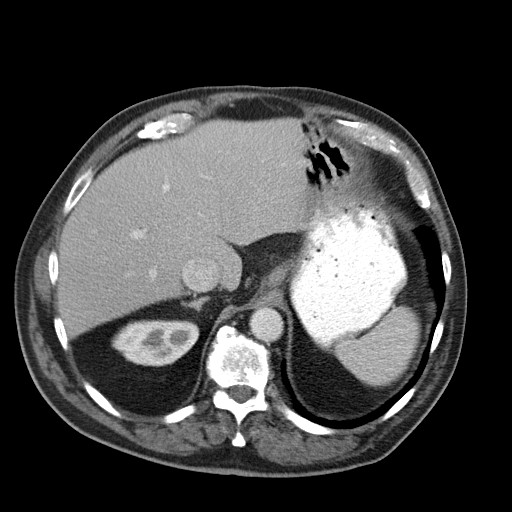
[im 93/99  soft-tissue]
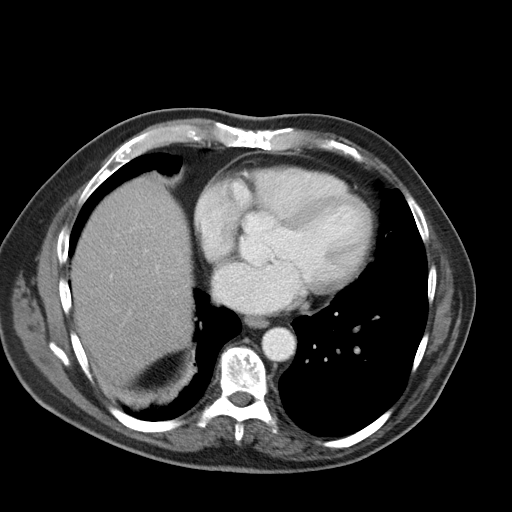

[Series 5: cor routine with · coronal · 0.70mm/px · 3 of 156 slices shown]
[im 52/156  soft-tissue]
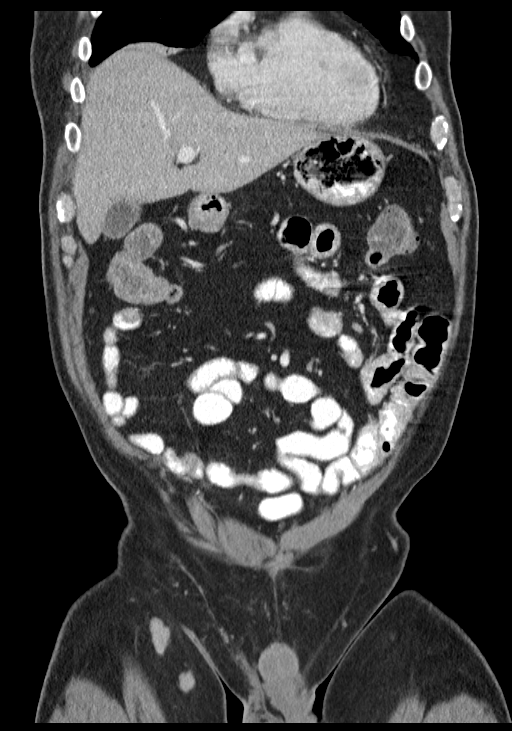
[im 69/156  soft-tissue]
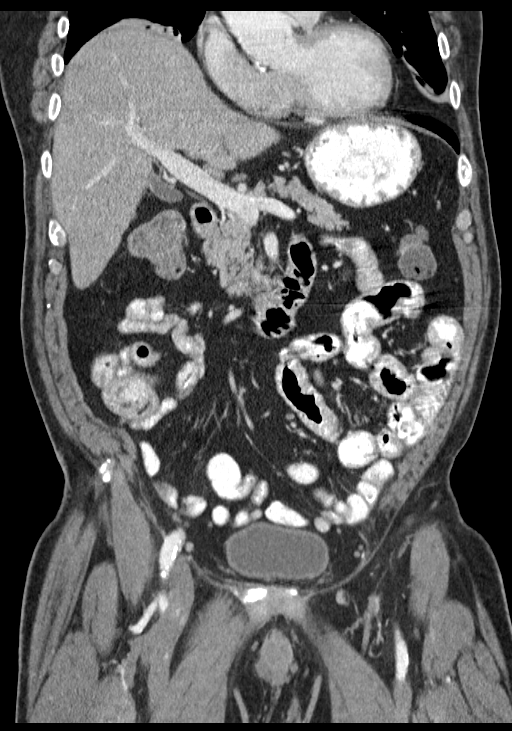
[im 87/156  soft-tissue]
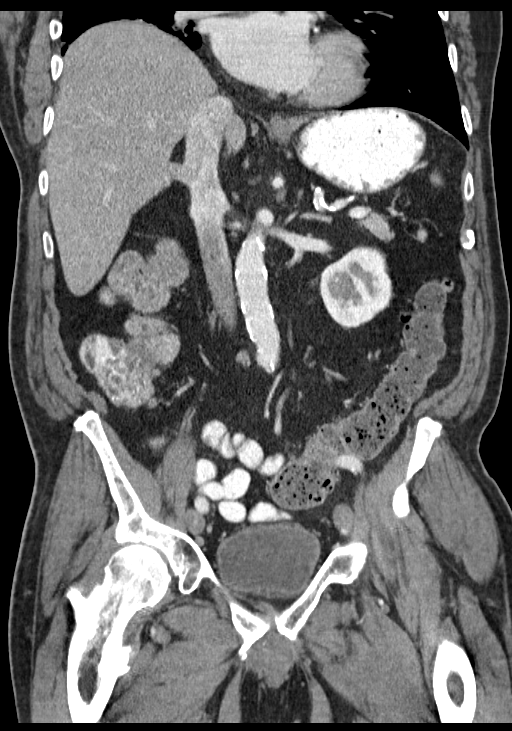

[15 of 46 positions shown; findings below may reference images not displayed]

FINDINGS: Lower chest: There is elevation of the right hemidiaphragm with
right basilar atelectasis. Coronary artery calcifications are seen.
No acute abnormality seen in the lung bases.

Hepatobiliary: There is a small calcified stone in the gallbladder
with no gallbladder wall thickening or pericholecystic fluid
identified. The liver and portal vein are otherwise normal.

Pancreas: No mass or inflammatory process identified on this exam.

Spleen: Within normal limits in size.

Adrenals/Urinary Tract: The adrenal glands are normal. A tiny cyst
is seen anteriorly in the left kidney, too small to completely
characterize. The kidneys are otherwise normal.

Stomach/Bowel: The stomach and small bowel are normal. There is
diverticulosis throughout the colon. There is minimal increased
attenuation in the fat adjacent to a distal descending colon
diverticulum as seen on axial image 54 and coronal image 101. No
other evidence of diverticulitis identified. The patient is status
post appendectomy.

Vascular/Lymphatic: Mild scattered atherosclerosis in the abdominal
aorta. No aneurysmal dilatation or dissection. No adenopathy. Shotty
nodes in the inguinal regions are likely reactive.

Reproductive: No mass or other significant abnormality.

Other: There is a small fat containing left inguinal hernia.

Musculoskeletal: A few sclerotic foci in the pelvis are likely bone
islands. There are degenerative changes in the lower lumbar spine.
No other bony abnormalities identified.
IMPRESSION: 1. Mild increased attenuation in the fat adjacent to a distal
descending colon diverticulum could represent low-grade mild
diverticulitis in the appropriate clinical setting. This finding
could potentially explain the patient's pelvic pain.
2. No other acute abnormalities identified.
These results will be called to the ordering clinician or
representative by the Radiologist Assistant, and communication
documented in the PACS or zVision Dashboard.

## 2016-11-19 ENCOUNTER — Other Ambulatory Visit: Payer: Medicare Other

## 2016-11-19 ENCOUNTER — Other Ambulatory Visit: Payer: Self-pay

## 2016-11-19 DIAGNOSIS — Z87898 Personal history of other specified conditions: Secondary | ICD-10-CM

## 2016-11-20 LAB — PSA: PROSTATE SPECIFIC AG, SERUM: 2.3 ng/mL (ref 0.0–4.0)

## 2016-11-23 ENCOUNTER — Ambulatory Visit: Payer: Medicare Other | Admitting: Urology

## 2016-11-27 NOTE — Progress Notes (Signed)
1:52 PM   Brian Callahan [facility] 1941-03-01 [id number]  Referring provider: Idelle Crouch, MD [facility] [place], [place] [address]  No chief complaint on file.   HPI: Patient is 76 year old Caucasian male with BPH and LUTS and a history of elevated PSA that is managed with tamsulosin and finasteride who presents today for a 6 month follow up.  BPH WITH LUTS His IPSS score today is 2, which is mild lower urinary tract symptomatology. He is pleased with his quality life due to his urinary symptoms.  His previous IPSS score was 2/1.  Marland Kitchen  His previous PVR was 125 mL.   He has no urinary complaints at this time.  He denies any dysuria, hematuria or suprapubic pain.   He is currently taking tamsulosin 0.4 mg and finasteride 5 mg daily. His has had biopsy in 2013 for which was negative.   He also denies any recent fevers, chills, nausea or vomiting.   He does not have a family history of PCa.      IPSS    Row Name 11/30/16 1300         International Prostate Symptom Score   How often have you had the sensation of not emptying your bladder? Not at All  Simultaneous filing. User may not have seen previous data.     How often have you had to urinate less than every two hours? Less than 1 in 5 times  Simultaneous filing. User may not have seen previous data.     How often have you found you stopped and started again several times when you urinated? Not at All  Simultaneous filing. User may not have seen previous data.     How often have you found it difficult to postpone urination? Not at All  Simultaneous filing. User may not have seen previous data.     How often have you had a weak urinary stream? Not at All  Simultaneous filing. User may not have seen previous data.     How often have you had to strain to start urination? Not at All  Simultaneous filing. User may not have seen previous data.     How many times did you typically get up at night to urinate? 1  Time  Simultaneous filing. User may not have seen previous data.     Total IPSS Score 2  Simultaneous filing. User may not have seen previous data.       Quality of Life due to urinary symptoms   If you were to spend the rest of your life with your urinary condition just the way it is now how would you feel about that? Pleased  Simultaneous filing. User may not have seen previous data.            IPSS    Row Name 11/30/16 1300         International Prostate Symptom Score   How often have you had the sensation of not emptying your bladder? Not at All  Simultaneous filing. User may not have seen previous data.     How often have you had to urinate less than every two hours? Less than 1 in 5 times  Simultaneous filing. User may not have seen previous data.     How often have you found you stopped and started again several times when you urinated? Not at All  Simultaneous filing. User may not have seen previous data.  How often have you found it difficult to postpone urination? Not at All  Simultaneous filing. User may not have seen previous data.     How often have you had a weak urinary stream? Not at All  Simultaneous filing. User may not have seen previous data.     How often have you had to strain to start urination? Not at All  Simultaneous filing. User may not have seen previous data.     How many times did you typically get up at night to urinate? 1 Time  Simultaneous filing. User may not have seen previous data.     Total IPSS Score 2  Simultaneous filing. User may not have seen previous data.       Quality of Life due to urinary symptoms   If you were to spend the rest of your life with your urinary condition just the way it is now how would you feel about that? Pleased  Simultaneous filing. User may not have seen previous data.        Score:  1-7 Mild 8-19 Moderate 20-35 Severe    History of elevated PSA Per patient, he had a biopsy in 2013 which was  negative.  The PSA at that time is unknown. He was referred to [place] in 2015 for a PSA of 4.66.  His most recent PSA is 2.3 ng/mL on 11/19/2016.     PMH: Past Medical History:  Diagnosis Date  . Anemia   . BP (high blood pressure) 05/23/2015  . BPH with obstruction/lower urinary tract symptoms   . COPD (chronic obstructive pulmonary disease) ([place])   . Dyspnea   . Encounter for long-term (current) use of other high-risk medications   . GERD (gastroesophageal reflux disease)   . GI bleed   . Gout   . H/O hernia repair mid-90s   x2  . History of elevated PSA   . History of [facility] spotted fever   . HLD (hyperlipidemia)   . HTN (hypertension)   . Hyperglycemia   . Hyperuricemia   . Incomplete bladder emptying   . Obesity   . Psoriasis   . Psoriasis   . Urinary frequency     Surgical History: Past Surgical History:  Procedure Laterality Date  . BACK SURGERY     neck and spine 2007  . CERVICAL SPINE SURGERY  2007  . COLECTOMY  1998   partial, due to obstruction  . COLONOSCOPY     lost 10 units of blood  . ESOPHAGOGASTRODUODENOSCOPY N/A 10/14/2016   Procedure: ESOPHAGOGASTRODUODENOSCOPY (EGD);  Surgeon: [name], MD;  Location: [facility] ENDOSCOPY;  Service: Endoscopy;  Laterality: N/A;  . HERNIA REPAIR  mid-90s   x2  . PROSTATE BIOPSY  2013  . SURGERY SCROTAL / TESTICULAR     for fibrous growth    Home Medications:  Allergies as of 11/30/2016   Not on File     Medication List       Accurate as of 11/30/16  1:52 PM. Always use your most recent med list.          Adalimumab 40 MG/0.8ML Pnkt Inject 40 mg into the skin.   ADVAIR DISKUS 250-50 MCG/DOSE Aepb Generic drug:  Fluticasone-Salmeterol   albuterol 108 (90 Base) MCG/ACT inhaler Commonly known as:  PROVENTIL HFA;VENTOLIN HFA Inhale into the lungs.   clobetasol ointment 0.05 % Commonly known as:  TEMOVATE Apply twice daily to thicker areas of psoriasis   DOCOSAHEXAENOIC ACID PO Take by  mouth.   enalapril 5 MG tablet Commonly known as:  VASOTEC   ezetimibe 10 MG tablet Commonly known as:  ZETIA Take 1 tablet by mouth once a day   ferrous sulfate 325 (65 FE) MG tablet Take 325 mg by mouth daily with breakfast.   finasteride 5 MG tablet Commonly known as:  PROSCAR Take 1 tablet (5 mg total) by mouth daily.   FLUZONE HIGH-DOSE 0.5 ML Susy Generic drug:  Influenza Vac Split High-Dose Reported on 11/21/2015   LYCOPENE PO Take by mouth.   Melatonin 5 MG Caps Take 5 mg by mouth.   metoprolol-hydrochlorothiazide 50-25 MG tablet Commonly known as:  LOPRESSOR HCT   MULTIPLE VITAMIN PO Take by mouth daily.   MULTI-VITAMINS Tabs Take by mouth.   neomycin-polymyxin-hydrocortisone 3.5-10000-1 otic suspension Commonly known as:  CORTISPORIN   omeprazole 40 MG capsule Commonly known as:  PRILOSEC   PRESERVISION AREDS 2 PO Take by mouth daily.   tamsulosin 0.4 MG Caps capsule Commonly known as:  FLOMAX Take 1 capsule (0.4 mg total) by mouth daily.   TART CHERRY ADVANCED PO Take by mouth daily.   triamcinolone ointment 0.1 % Commonly known as:  KENALOG Apply twice daily to thinner areas of psoriasis       Allergies: Not on File  Family History: Family History  Problem Relation Age of Onset  . Kidney failure Brother   . Prostate cancer Neg Hx     Social History:  reports that he has quit smoking. He has never used smokeless tobacco. He reports that he drinks alcohol. He reports that he does not use drugs.  ROS: UROLOGY Frequent Urination?: No Hard to postpone urination?: No Burning/pain with urination?: No Get up at night to urinate?: No Leakage of urine?: No Urine stream starts and stops?: No Trouble starting stream?: No Do you have to strain to urinate?: No Blood in urine?: No Urinary tract infection?: No Sexually transmitted disease?: No Injury to kidneys or bladder?: No Painful intercourse?: No Weak stream?: No Erection problems?:  No Penile pain?: No  Gastrointestinal Nausea?: No Vomiting?: No Indigestion/heartburn?: No Diarrhea?: No Constipation?: No  Constitutional Fever: No Night sweats?: No Weight loss?: No Fatigue?: No  Skin Skin rash/lesions?: No Itching?: No  Eyes Blurred vision?: No Double vision?: No  Ears/Nose/Throat Sore throat?: No Sinus problems?: No  Hematologic/Lymphatic Swollen glands?: No Easy bruising?: No  Cardiovascular Chest pain?: No  Respiratory Cough?: No Shortness of breath?: No  Endocrine Excessive thirst?: No  Musculoskeletal Back pain?: No Joint pain?: No  Neurological Headaches?: No Dizziness?: No  Psychologic Depression?: No Anxiety?: No  Physical Exam: BP 120/70   Pulse 60   Ht 5\' 6"  (1.676 m)   Wt 184 lb 12.8 oz (83.8 kg)   BMI 29.83 kg/m   GU: No CVA tenderness.  No bladder fullness or masses.  Patient with uncircumcised phallus. Foreskin easily retracted  Urethral meatus is patent.  No penile discharge. No penile lesions or rashes. Scrotum without lesions, cysts, rashes and/or edema.  Testicles are located scrotally bilaterally. No masses are appreciated in the testicles. Left and right epididymis are normal. Rectal: Patient with  normal sphincter tone. Anus and perineum without scarring or rashes. No rectal masses are appreciated. Prostate is approximately 50 grams, no nodules are appreciated. Seminal vesicles are normal.   Laboratory Data: Lab Results  Component Value Date   WBC 5.8 12/06/2013   HGB 7.3 (L) 12/06/2013   HCT 21.6 (L) 12/06/2013   MCV 92  12/06/2013   PLT 188 12/06/2013    Lab Results  Component Value Date   CREATININE 0.88 12/02/2013    Lab Results  Component Value Date   HGBA1C 4.8 04/11/2012   PSA History:  2.6 ng/mL on 04/25/2014  1.6 ng/mL on 11/22/2014  1.8 ng/mL on 05/23/2015  2.3 ng/mL on 11/18/2015  2.0 ng/mL on 03/10/2016  2.3 ng/mL on 11/19/2016     1. BPH with LUTS  - IPSS score is 2/1, it  is stable   - Continue conservative management, avoiding bladder irritants and timed voiding's  - Continue tamsulosin 0.4 mg daily and finasteride 5 mg daily, Refills given  - RTC in 6 months for IPSS, PSA and exam   2. History of elevated PSA  - Current PSA is 2.3  - Return to clinic in 6 months for PSA and exam  Return in about 6 months (around 06/02/2017) for IPSS, PSA and exam.  Zara Council, [facility]  [facility] [address] [place], [place] [address] [phone]

## 2016-11-30 ENCOUNTER — Encounter: Payer: Self-pay | Admitting: Urology

## 2016-11-30 ENCOUNTER — Ambulatory Visit (INDEPENDENT_AMBULATORY_CARE_PROVIDER_SITE_OTHER): Payer: Medicare Other | Admitting: Urology

## 2016-11-30 VITALS — BP 120/70 | HR 60 | Ht 66.0 in | Wt 184.8 lb

## 2016-11-30 DIAGNOSIS — N400 Enlarged prostate without lower urinary tract symptoms: Secondary | ICD-10-CM

## 2016-11-30 DIAGNOSIS — N401 Enlarged prostate with lower urinary tract symptoms: Secondary | ICD-10-CM

## 2016-11-30 DIAGNOSIS — N138 Other obstructive and reflux uropathy: Secondary | ICD-10-CM

## 2016-11-30 DIAGNOSIS — Z87898 Personal history of other specified conditions: Secondary | ICD-10-CM

## 2016-11-30 MED ORDER — FINASTERIDE 5 MG PO TABS: 5.0000 mg | ORAL_TABLET | Freq: Every day | ORAL | 3 refills | Status: DC

## 2016-11-30 MED ORDER — TAMSULOSIN HCL 0.4 MG PO CAPS: 0.4000 mg | ORAL_CAPSULE | Freq: Every day | ORAL | 3 refills | Status: DC

## 2017-05-12 ENCOUNTER — Ambulatory Visit: Admission: RE | Admit: 2017-05-12 | Discharge: 2017-05-12 | Payer: MEDICARE

## 2017-05-12 DIAGNOSIS — L409 Psoriasis, unspecified: Principal | ICD-10-CM

## 2017-05-12 DIAGNOSIS — Z79899 Other long term (current) drug therapy: Secondary | ICD-10-CM

## 2017-05-12 MED ORDER — BETAMETHASONE, AUGMENTED 0.05 % TOPICAL CREAM
Freq: Two times a day (BID) | TOPICAL | 6 refills | 0.00000 days | Status: CP
Start: 2017-05-12 — End: 2018-05-12

## 2017-06-01 ENCOUNTER — Other Ambulatory Visit: Payer: Medicare Other

## 2017-06-01 ENCOUNTER — Other Ambulatory Visit: Payer: Self-pay

## 2017-06-01 DIAGNOSIS — Z87898 Personal history of other specified conditions: Secondary | ICD-10-CM

## 2017-06-02 LAB — PSA: Prostate Specific Ag, Serum: 2.5 ng/mL (ref 0.0–4.0)

## 2017-06-02 NOTE — Progress Notes (Signed)
1:44 PM   Brian Callahan [facility] 04/08/1941 [id number]  Referring provider: Idelle Crouch, MD [facility] [place], [place] [address]  Chief Complaint  Patient presents with  . Benign Prostatic Hypertrophy  . Elevated PSA    HPI: Patient is 76 year old Caucasian male with BPH and LUTS and a history of elevated PSA that is managed with tamsulosin and finasteride who presents today for a 6 month follow up.  BPH WITH LUTS His IPSS score today is 1, which is mild lower urinary tract symptomatology. He is delighted with his quality life due to his urinary symptoms.  His previous IPSS score was 2/1.  His previous PVR was 125 mL.   He has no urinary complaints at this time.  He denies any dysuria, hematuria or suprapubic pain.   He is currently taking finasteride 5 mg daily. His has had biopsy in 2013 for which was negative.   He also denies any recent fevers, chills, nausea or vomiting.   He does not have a family history of PCa.  IPSS    Row Name 06/03/17 1300         International Prostate Symptom Score   How often have you had the sensation of not emptying your bladder?  Not at All     How often have you had to urinate less than every two hours?  Not at All     How often have you found you stopped and started again several times when you urinated?  Not at All     How often have you found it difficult to postpone urination?  Not at All     How often have you had a weak urinary stream?  Not at All     How often have you had to strain to start urination?  Not at All     How many times did you typically get up at night to urinate?  1 Time     Total IPSS Score  1       Quality of Life due to urinary symptoms   If you were to spend the rest of your life with your urinary condition just the way it is now how would you feel about that?  Delighted         Score:  1-7 Mild 8-19 Moderate 20-35 Severe    History of elevated PSA Per patient, he had a biopsy  in 2013 which was negative.  The PSA at that time is unknown. He was referred to [place] in 2015 for a PSA of 4.66.  His most recent PSA is 2.5 ng/mL on 05/2017.     PMH: Past Medical History:  Diagnosis Date  . Anemia   . BP (high blood pressure) 05/23/2015  . BPH with obstruction/lower urinary tract symptoms   . COPD (chronic obstructive pulmonary disease) ([place])   . Dyspnea   . Encounter for long-term (current) use of other high-risk medications   . GERD (gastroesophageal reflux disease)   . GI bleed   . Gout   . H/O hernia repair mid-90s   x2  . History of elevated PSA   . History of [facility] spotted fever   . HLD (hyperlipidemia)   . HTN (hypertension)   . Hyperglycemia   . Hyperuricemia   . Incomplete bladder emptying   . Obesity   . Psoriasis   . Psoriasis   . Urinary frequency     Surgical History: Past Surgical  History:  Procedure Laterality Date  . BACK SURGERY     neck and spine 2007  . CERVICAL SPINE SURGERY  2007  . COLECTOMY  1998   partial, due to obstruction  . COLONOSCOPY     lost 10 units of blood  . HERNIA REPAIR  mid-90s   x2  . PROSTATE BIOPSY  2013  . SURGERY SCROTAL / TESTICULAR     for fibrous growth    Home Medications:  Allergies as of 06/03/2017   No Known Allergies     Medication List        Accurate as of 06/03/17  1:44 PM. Always use your most recent med list.          Adalimumab 40 MG/0.8ML Pnkt Inject 40 mg every 14 (fourteen) days into the skin.   ADVAIR DISKUS 250-50 MCG/DOSE Aepb Generic drug:  Fluticasone-Salmeterol Inhale 1 puff 2 (two) times daily into the lungs.   albuterol 108 (90 Base) MCG/ACT inhaler Commonly known as:  PROVENTIL HFA;VENTOLIN HFA Inhale into the lungs.   clobetasol ointment 0.05 % Commonly known as:  TEMOVATE Apply twice daily to thicker areas of psoriasis   enalapril 5 MG tablet Commonly known as:  VASOTEC Take 5 mg daily by mouth.   ezetimibe 10 MG tablet Commonly known as:   ZETIA Take 1 tablet by mouth once a day   finasteride 5 MG tablet Commonly known as:  PROSCAR Take 1 tablet (5 mg total) by mouth daily.   Melatonin 5 MG Caps Take 5 mg at bedtime as needed by mouth.   metoprolol-hydrochlorothiazide 50-25 MG tablet Commonly known as:  LOPRESSOR HCT   omeprazole 40 MG capsule Commonly known as:  PRILOSEC   PRESERVISION AREDS 2 PO Take by mouth daily.   TART CHERRY ADVANCED PO Take by mouth daily.   triamcinolone ointment 0.1 % Commonly known as:  KENALOG Apply twice daily to thinner areas of psoriasis       Allergies: No Known Allergies  Family History: Family History  Problem Relation Age of Onset  . Kidney failure Brother   . Prostate cancer Neg Hx     Social History:  reports that he has quit smoking. he has never used smokeless tobacco. He reports that he drinks alcohol. He reports that he does not use drugs.  ROS: UROLOGY Frequent Urination?: No Hard to postpone urination?: No Burning/pain with urination?: No Get up at night to urinate?: No Leakage of urine?: No Urine stream starts and stops?: No Trouble starting stream?: No Do you have to strain to urinate?: No Blood in urine?: No Urinary tract infection?: No Sexually transmitted disease?: No Injury to kidneys or bladder?: No Painful intercourse?: No Weak stream?: No Erection problems?: No Penile pain?: No  Gastrointestinal Nausea?: No Vomiting?: No Indigestion/heartburn?: No Diarrhea?: No Constipation?: No  Constitutional Fever: No Night sweats?: No Weight loss?: No Fatigue?: No  Skin Skin rash/lesions?: No Itching?: No  Eyes Blurred vision?: No Double vision?: No  Ears/Nose/Throat Sore throat?: No Sinus problems?: No  Hematologic/Lymphatic Swollen glands?: No Easy bruising?: No  Cardiovascular Leg swelling?: No Chest pain?: No  Respiratory Cough?: No Shortness of breath?: No  Endocrine Excessive thirst?:  No  Musculoskeletal Back pain?: No Joint pain?: No  Neurological Headaches?: No Dizziness?: No  Psychologic Depression?: No Anxiety?: No  Physical Exam: BP (!) 172/79   Pulse 67   Ht 5\' 6"  (1.676 m)   Wt 185 lb 9.6 oz (84.2 kg)   BMI 29.96  kg/m   GU: No CVA tenderness.  No bladder fullness or masses.  Patient with uncircumcised phallus. Foreskin easily retracted  Urethral meatus is patent.  No penile discharge. No penile lesions or rashes. Scrotum without lesions, cysts, rashes and/or edema.  Testicles are located scrotally bilaterally. No masses are appreciated in the testicles. Left and right epididymis are normal. Rectal: Patient with  normal sphincter tone. Anus and perineum without scarring or rashes. No rectal masses are appreciated. Prostate is approximately 50 grams, no nodules are appreciated. Seminal vesicles are normal.   Laboratory Data: PSA History:  2.6 ng/mL on 04/25/2014  1.6 ng/mL on 11/22/2014  1.8 ng/mL on 05/23/2015  2.3 ng/mL on 11/18/2015  2.0 ng/mL on 03/10/2016  2.3 ng/mL on 11/19/2016  2.5 ng/mL on 06/01/2017     1. BPH with LUTS  - IPSS score is 1/0, it is improved  - Continue conservative management, avoiding bladder irritants and timed voiding's  - Continue finasteride 5 mg daily; refills not needed at this time  - RTC in 6 months for IPSS, PSA and exam   2. History of elevated PSA  - Current PSA is 2.5  - Return to clinic in 6 months for PSA and exam  Return in about 6 months (around 12/01/2017) for IPSS, PSA and exam.  Zara Council, [facility]  [place] [address], [place], [place] [address] [phone]

## 2017-06-03 ENCOUNTER — Ambulatory Visit: Payer: Medicare Other | Admitting: Urology

## 2017-06-03 ENCOUNTER — Encounter: Payer: Self-pay | Admitting: Urology

## 2017-06-03 VITALS — BP 172/79 | HR 67 | Ht 66.0 in | Wt 185.6 lb

## 2017-06-03 DIAGNOSIS — N138 Other obstructive and reflux uropathy: Secondary | ICD-10-CM | POA: Diagnosis not present

## 2017-06-03 DIAGNOSIS — Z87898 Personal history of other specified conditions: Secondary | ICD-10-CM

## 2017-06-03 DIAGNOSIS — N401 Enlarged prostate with lower urinary tract symptoms: Secondary | ICD-10-CM | POA: Diagnosis not present

## 2017-07-01 ENCOUNTER — Other Ambulatory Visit: Payer: Self-pay | Admitting: Internal Medicine

## 2017-07-01 DIAGNOSIS — R1084 Generalized abdominal pain: Secondary | ICD-10-CM

## 2017-07-01 DIAGNOSIS — R42 Dizziness and giddiness: Secondary | ICD-10-CM

## 2017-07-02 DIAGNOSIS — R0602 Shortness of breath: Secondary | ICD-10-CM | POA: Insufficient documentation

## 2017-07-07 ENCOUNTER — Ambulatory Visit: Payer: Medicare Other

## 2017-07-14 ENCOUNTER — Ambulatory Visit
Admission: RE | Admit: 2017-07-14 | Discharge: 2017-07-14 | Disposition: A | Discharge status: Home / Self Care | Payer: Medicare Other | Source: Ambulatory Visit | Attending: Internal Medicine | Admitting: Internal Medicine

## 2017-07-14 DIAGNOSIS — I6523 Occlusion and stenosis of bilateral carotid arteries: Secondary | ICD-10-CM | POA: Diagnosis not present

## 2017-07-14 DIAGNOSIS — K760 Fatty (change of) liver, not elsewhere classified: Secondary | ICD-10-CM | POA: Diagnosis not present

## 2017-07-14 DIAGNOSIS — R42 Dizziness and giddiness: Secondary | ICD-10-CM | POA: Insufficient documentation

## 2017-07-14 DIAGNOSIS — R1084 Generalized abdominal pain: Secondary | ICD-10-CM | POA: Diagnosis not present

## 2017-07-14 DIAGNOSIS — K8020 Calculus of gallbladder without cholecystitis without obstruction: Secondary | ICD-10-CM | POA: Diagnosis not present

## 2017-08-10 MED ORDER — ADALIMUMAB 40 MG/0.8 ML SUBCUTANEOUS PEN KIT
SUBCUTANEOUS | 0 refills | 0.00000 days | Status: CP
Start: 2017-08-10 — End: 2018-05-03

## 2017-10-04 ENCOUNTER — Other Ambulatory Visit: Payer: Self-pay | Admitting: Urology

## 2017-10-04 DIAGNOSIS — N400 Enlarged prostate without lower urinary tract symptoms: Secondary | ICD-10-CM

## 2017-10-05 ENCOUNTER — Ambulatory Visit: Payer: Self-pay | Admitting: General Surgery

## 2017-10-05 NOTE — H&P (Signed)
HISTORY OF PRESENT ILLNESS:    [name] is a 77 y.o.male patient who comes for follow up symptomatic cholelithiasis. Patient refers pain continue on the the right upper quadrant. Pain does not radiates. Pain is improved by Tums some of the time. Pain is aggravated with food intake. Cannot specify a specific type of food that produce the pain. Denies episode of fever or chills or episode of jaundice.       PAST MEDICAL HISTORY:      Past Medical History:  Diagnosis Date  . Abdominal pain, epigastric 08/06/2016  . Anemia, unspecified    R/t GIB  . Eye problems   . Gastroesophageal reflux disease without esophagitis 09/05/2015  . GI bleed, unspecified, unspecified 04/15/2012  . H/O gastric ulcer 09/05/2015  . H/O [facility] spotted fever    hospitalization  . Hard of hearing   . History of gastritis 09/05/2015  . Hyperglycemia, unspecified   . Hyperlipidemia, unspecified   . Hypertension   . Psoriasis, unspecified    per dermatogy        PAST SURGICAL HISTORY:        Past Surgical History:  Procedure Laterality Date  . [place] [facility], 2 years ago. No cancer  . BPH    . Bursitis of his knee  2009  . Colectomy, partial 1998. Dr. Faylene Million. Due to obstruction. Non-cancerous    . COLONOSCOPY  10/31/1996   Hyperplastic Polyp  . COLONOSCOPY  07/11/2012   Polypoid Polyp  . COLONOSCOPY  12/05/2013   (Inpt) Adenomatous Polyp: CBF 11/2018  . EGD  12/05/2013   No repeat per RTE  . EGD  10/14/2016   Gastritis: No repeat per RTE  . GI bleed   2013  . HERNIA REPAIR     x 2 in the mid 90's  . neck/spine surgery     2007  . SURGERY SCROTAL / TESTICULAR     surgery for fibrous growth         MEDICATIONS:  EncounterMedications        Outpatient Encounter Medications as of 10/05/2017  Medication Sig Dispense Refill  . adalimumab (HUMIRA) 40 mg/0.8 mL prefilled syringe kit Inject 40 mg subcutaneously every  14 (fourteen) days    . ADVAIR DISKUS 250-50 mcg/dose diskus inhaler USE 1 INHALATION EVERY 12  HOURS 180 each 0  . ANTIOX #8/OM3/DHA/EPA/LUT/ZEAX (PRESERVISION AREDS 2, OMEGA-3, ORAL) Take by mouth.    . betamethasone dipropionate, augmented, (DIPROLENE-AF) 0.05 % cream Apply topically 2 (two) times daily Do not exceed 2 weeks of treatment or 45 g/week.    . enalapril (VASOTEC) 5 MG tablet TAKE 1 TABLET BY MOUTH ONCE A DAY 90 tablet 1  . enalapril (VASOTEC) 5 MG tablet TAKE 1 TABLET BY MOUTH ONCE A DAY 90 tablet 1  . ezetimibe (ZETIA) 10 mg tablet TAKE 1 TABLET BY MOUTH ONCE A DAY 90 tablet 1  . finasteride (PROSCAR) 5 mg tablet Take 5 mg by mouth once daily.    . isosorbide mononitrate (IMDUR) 30 MG ER tablet Take 1 tablet (30 mg total) by mouth once daily 30 tablet 5  . melatonin 5 mg Cap Take by mouth.    . metoprolol succinate (TOPROL-XL) 50 MG XL tablet TAKE 1 TABLET BY MOUTH ONCE DAILY 90 tablet 3  . omeprazole (PRILOSEC) 40 MG DR capsule TAKE 1 CAPSULE BY MOUTH TWO TIMES DAILY 180 capsule 2  . tamsulosin (FLOMAX) 0.4 mg capsule Take 0.4  mg by mouth once daily. Take 30 minutes after same meal each day.    Marland Kitchen VIT C/CHERRY & CELERY EX/GRP E (TART CHERRY ORAL) Take by mouth.     No facility-administered encounter medications on file as of 10/05/2017.        ALLERGIES:   Patient has no known allergies.   SOCIAL HISTORY:  Social History     Socioeconomic History  . Marital status: Married    Spouse name: Not on file  . Number of children: Not on file  . Years of education: Not on file  . Highest education level: Not on file  Occupational History  . Not on file  Social Needs  . Financial resource strain: Not on file  . Food insecurity:    Worry: Not on file    Inability: Not on file  . Transportation needs:    Medical: Not on file    Non-medical: Not on file  Tobacco Use  . Smoking status: Former Smoker    Packs/day: 0.50    Years: 15.00     Pack years: 7.50    Types: Cigarettes  . Smokeless tobacco: Never Used  . Tobacco comment: quit 25 yrs ago  Substance and Sexual Activity  . Alcohol use: No    Alcohol/week: 0.0 oz  . Drug use: No  . Sexual activity: Defer    Partners: Female  Other Topics Concern  . Not on file  Social History Narrative   Psurg hx   Neck/spine surgery 2007   Colectomy, partial 1998. Dr. Faylene Million. Due to obstruction. Non-cancerous   Prostate biopsy, [place] [facility], 2 years ago. No cancer   4/11 hospitalization for [place] fever[place]:   Works in [employer], high school grad, married, former smoker 1 ppd x 13 years and quit 1993, alcohol-12-14 beers per week. CAGE negative.      FAMILY HISTORY:       Family History  Problem Relation Age of Onset  . Stroke Father   . Emphysema Mother   . No Known Problems Sister   . Kidney disease Brother   . Diabetes Brother   . Heart disease Brother   . High blood pressure (Hypertension) Brother   . Diabetes Brother   . Anemia Brother   . No Known Problems Daughter   . Bipolar disorder Son   . No Known Problems Daughter   . No Known Problems Daughter   . High blood pressure (Hypertension) Daughter      GENERAL REVIEW OF SYSTEMS:   General ROS: negative for - chills, fatigue, fever, weight gain or weight loss Allergy and Immunology ROS: negative for - hives  Hematological and Lymphatic ROS: negative for - bleeding problems or bruising, negative for palpable nodes Endocrine ROS: negative for - heat or cold intolerance, hair changes Respiratory ROS: negative for - cough, Positive for shortness Cardiovascular ROS: Positive for chest pain or palpitations GI ROS: negative for nausea, vomiting, diarrhea, constipation, Positive for abdominal pain, Musculoskeletal ROS: negative for - joint swelling or muscle pain Neurological ROS: negative for - confusion, syncope Dermatological ROS: negative for pruritus and  rash  PHYSICAL EXAM:     Vitals:   10/05/17 1119  BP: 153/85  Pulse: 66  Temp: 36.3 C (97.4 F)  .  Ht:167.6 cm (_0 ) Wt:83 kg (183 lb) IWL:NLGX surface area is 1.97 meters squared. Body mass index is 29.54 kg/m.Marland Kitchen   GENERAL: Alert, active, oriented x3  HEENT: Pupils equal reactive to light. Extraocular movements  are intact. Sclera clear. Palpebral conjunctiva normal red color.Pharynx clear.  NECK: Supple with no palpable mass and no adenopathy.  LUNGS: Sound clear with no rales rhonchi or wheezes.  HEART: Regular rhythm S1 and S2 without murmur.  ABDOMEN: Soft and depressible, nontender with no palpable mass, no hepatomegaly. Healed midline scar.   EXTREMITIES: Well-developed well-nourished symmetrical with no dependent edema.  NEUROLOGICAL: Awake alert oriented, facial expression symmetrical, moving all extremities.      IMPRESSION:   Patient with symptomatic cholelithiasis. Still having symptoms. Cardiology optimized medications and cleared patient for surgery. Patient was oriented again about the surgical procedure, the benefits, goals and risks including: bile duct injury, adjacent organ injury, bile leak, infections, bleeding, small bowel obstruction, hernia on incisions among others.            PLAN:  1. Laparoscopic cholecystectomy 47562 2. CBC, CMP 3. Internal Medicine Clearance 4. Cardiology clearance - done 5. Avoid aspirin medications 5 days before surgery  Patient or representative verbalized understanding, all questions were answered, and were agreeable with the plan outlined above.   [name], MD  Electronically signed by [name], MD

## 2017-10-05 NOTE — H&P (View-Only) (Signed)
HISTORY OF PRESENT ILLNESS:    Brian Callahan is a 77 y.o.male patient who comes for follow up symptomatic cholelithiasis. Patient refers pain continue on the the right upper quadrant. Pain does not radiates. Pain is improved by Tums some of the time. Pain is aggravated with food intake. Cannot specify a specific type of food that produce the pain. Denies episode of fever or chills or episode of jaundice.       PAST MEDICAL HISTORY:      Past Medical History:  Diagnosis Date  . Abdominal pain, epigastric 08/06/2016  . Anemia, unspecified    R/t GIB  . Eye problems   . Gastroesophageal reflux disease without esophagitis 09/05/2015  . GI bleed, unspecified, unspecified 04/15/2012  . H/O gastric ulcer 09/05/2015  . H/O [facility] spotted fever    hospitalization  . Hard of hearing   . History of gastritis 09/05/2015  . Hyperglycemia, unspecified   . Hyperlipidemia, unspecified   . Hypertension   . Psoriasis, unspecified    per dermatogy        PAST SURGICAL HISTORY:        Past Surgical History:  Procedure Laterality Date  . [place] [facility], 2 years ago. No cancer  . BPH    . Bursitis of his knee  2009  . Colectomy, partial 1998. Dr. Faylene Million. Due to obstruction. Non-cancerous    . COLONOSCOPY  10/31/1996   Hyperplastic Polyp  . COLONOSCOPY  07/11/2012   Polypoid Polyp  . COLONOSCOPY  12/05/2013   (Inpt) Adenomatous Polyp: CBF 11/2018  . EGD  12/05/2013   No repeat per RTE  . EGD  10/14/2016   Gastritis: No repeat per RTE  . GI bleed   2013  . HERNIA REPAIR     x 2 in the mid 90's  . neck/spine surgery     2007  . SURGERY SCROTAL / TESTICULAR     surgery for fibrous growth         MEDICATIONS:  EncounterMedications        Outpatient Encounter Medications as of 10/05/2017  Medication Sig Dispense Refill  . adalimumab (HUMIRA) 40 mg/0.8 mL prefilled syringe kit Inject 40 mg subcutaneously every  14 (fourteen) days    . ADVAIR DISKUS 250-50 mcg/dose diskus inhaler USE 1 INHALATION EVERY 12  HOURS 180 each 0  . ANTIOX #8/OM3/DHA/EPA/LUT/ZEAX (PRESERVISION AREDS 2, OMEGA-3, ORAL) Take by mouth.    . betamethasone dipropionate, augmented, (DIPROLENE-AF) 0.05 % cream Apply topically 2 (two) times daily Do not exceed 2 weeks of treatment or 45 g/week.    . enalapril (VASOTEC) 5 MG tablet TAKE 1 TABLET BY MOUTH ONCE A DAY 90 tablet 1  . enalapril (VASOTEC) 5 MG tablet TAKE 1 TABLET BY MOUTH ONCE A DAY 90 tablet 1  . ezetimibe (ZETIA) 10 mg tablet TAKE 1 TABLET BY MOUTH ONCE A DAY 90 tablet 1  . finasteride (PROSCAR) 5 mg tablet Take 5 mg by mouth once daily.    . isosorbide mononitrate (IMDUR) 30 MG ER tablet Take 1 tablet (30 mg total) by mouth once daily 30 tablet 5  . melatonin 5 mg Cap Take by mouth.    . metoprolol succinate (TOPROL-XL) 50 MG XL tablet TAKE 1 TABLET BY MOUTH ONCE DAILY 90 tablet 3  . omeprazole (PRILOSEC) 40 MG DR capsule TAKE 1 CAPSULE BY MOUTH TWO TIMES DAILY 180 capsule 2  . tamsulosin (FLOMAX) 0.4 mg capsule Take 0.4  mg by mouth once daily. Take 30 minutes after same meal each day.    Marland Kitchen VIT C/CHERRY & CELERY EX/GRP E (TART CHERRY ORAL) Take by mouth.     No facility-administered encounter medications on file as of 10/05/2017.        ALLERGIES:   Patient has no known allergies.   SOCIAL HISTORY:  Social History     Socioeconomic History  . Marital status: Married    Spouse name: Not on file  . Number of children: Not on file  . Years of education: Not on file  . Highest education level: Not on file  Occupational History  . Not on file  Social Needs  . Financial resource strain: Not on file  . Food insecurity:    Worry: Not on file    Inability: Not on file  . Transportation needs:    Medical: Not on file    Non-medical: Not on file  Tobacco Use  . Smoking status: Former Smoker    Packs/day: 0.50    Years: 15.00     Pack years: 7.50    Types: Cigarettes  . Smokeless tobacco: Never Used  . Tobacco comment: quit 25 yrs ago  Substance and Sexual Activity  . Alcohol use: No    Alcohol/week: 0.0 oz  . Drug use: No  . Sexual activity: Defer    Partners: Female  Other Topics Concern  . Not on file  Social History Narrative   Psurg hx   Neck/spine surgery 2007   Colectomy, partial 1998. Dr. Faylene Million. Due to obstruction. Non-cancerous   Prostate biopsy, [place] [facility], 2 years ago. No cancer   4/11 hospitalization for [place] fever[place]:   Works in [employer], high school grad, married, former smoker 1 ppd x 13 years and quit 1993, alcohol-12-14 beers per week. CAGE negative.      FAMILY HISTORY:       Family History  Problem Relation Age of Onset  . Stroke Father   . Emphysema Mother   . No Known Problems Sister   . Kidney disease Brother   . Diabetes Brother   . Heart disease Brother   . High blood pressure (Hypertension) Brother   . Diabetes Brother   . Anemia Brother   . No Known Problems Daughter   . Bipolar disorder Son   . No Known Problems Daughter   . No Known Problems Daughter   . High blood pressure (Hypertension) Daughter      GENERAL REVIEW OF SYSTEMS:   General ROS: negative for - chills, fatigue, fever, weight gain or weight loss Allergy and Immunology ROS: negative for - hives  Hematological and Lymphatic ROS: negative for - bleeding problems or bruising, negative for palpable nodes Endocrine ROS: negative for - heat or cold intolerance, hair changes Respiratory ROS: negative for - cough, Positive for shortness Cardiovascular ROS: Positive for chest pain or palpitations GI ROS: negative for nausea, vomiting, diarrhea, constipation, Positive for abdominal pain, Musculoskeletal ROS: negative for - joint swelling or muscle pain Neurological ROS: negative for - confusion, syncope Dermatological ROS: negative for pruritus and  rash  PHYSICAL EXAM:     Vitals:   10/05/17 1119  BP: 153/85  Pulse: 66  Temp: 36.3 C (97.4 F)  .  Ht:167.6 cm (_0 ) Wt:83 kg (183 lb) IWL:NLGX surface area is 1.97 meters squared. Body mass index is 29.54 kg/m.Marland Kitchen   GENERAL: Alert, active, oriented x3  HEENT: Pupils equal reactive to light. Extraocular movements  are intact. Sclera clear. Palpebral conjunctiva normal red color.Pharynx clear.  NECK: Supple with no palpable mass and no adenopathy.  LUNGS: Sound clear with no rales rhonchi or wheezes.  HEART: Regular rhythm S1 and S2 without murmur.  ABDOMEN: Soft and depressible, nontender with no palpable mass, no hepatomegaly. Healed midline scar.   EXTREMITIES: Well-developed well-nourished symmetrical with no dependent edema.  NEUROLOGICAL: Awake alert oriented, facial expression symmetrical, moving all extremities.      IMPRESSION:   Patient with symptomatic cholelithiasis. Still having symptoms. Cardiology optimized medications and cleared patient for surgery. Patient was oriented again about the surgical procedure, the benefits, goals and risks including: bile duct injury, adjacent organ injury, bile leak, infections, bleeding, small bowel obstruction, hernia on incisions among others.            PLAN:  1. Laparoscopic cholecystectomy 47562 2. CBC, CMP 3. Internal Medicine Clearance 4. Cardiology clearance - done 5. Avoid aspirin medications 5 days before surgery  Patient or representative verbalized understanding, all questions were answered, and were agreeable with the plan outlined above.   [name], MD  Electronically signed by [name], MD

## 2017-10-11 ENCOUNTER — Encounter
Admission: RE | Admit: 2017-10-11 | Discharge: 2017-10-11 | Disposition: A | Discharge status: Home / Self Care | Payer: Medicare Other | Source: Ambulatory Visit | Attending: General Surgery | Admitting: General Surgery

## 2017-10-11 ENCOUNTER — Other Ambulatory Visit: Payer: Self-pay

## 2017-10-11 DIAGNOSIS — Z01818 Encounter for other preprocedural examination: Secondary | ICD-10-CM | POA: Insufficient documentation

## 2017-10-11 NOTE — Pre-Procedure Instructions (Signed)
Faxed notification of MRSA Staph positive results to Dr. Rhea Bleacher office.

## 2017-10-11 NOTE — Patient Instructions (Signed)
Your procedure is scheduled on: Monday 10/18/17 Report to Day Surgery. To find out your arrival time please call [phone] between 1PM - 3PM on Friday 10/15/17  Remember: Instructions that are not followed completely may result in serious medical risk, up to and including death, or upon the discretion of your surgeon and anesthesiologist your surgery may need to be rescheduled.     _X__ 1. Do not eat food after midnight the night before your procedure.                 No gum chewing or hard candies. You may drink clear liquids up to 2 hours                 before you are scheduled to arrive for your surgery- DO not drink clear                 liquids within 2 hours of the start of your surgery.                 Clear Liquids include:  water, apple juice without pulp, clear carbohydrate                 drink such as Clearfast of Gartorade, Black Coffee or Tea (Do not add                 anything to coffee or tea).  __X__2.  On the morning of surgery brush your teeth with toothpaste and water, you may rinse your mouth with mouthwash if you wish.  Do not swallow any              toothpaste of mouthwash.     _X__ 3.  No Alcohol for 24 hours before or after surgery.   ___ 4.  Do Not Smoke or use e-cigarettes For 24 Hours Prior to Your Surgery.                 Do not use any chewable tobacco products for at least 6 hours prior to                 surgery.  ____  5.  Bring all medications with you on the day of surgery if instructed.   __x__  6.  Notify your doctor if there is any change in your medical condition      (cold, fever, infections).     Do not wear jewelry, make-up, hairpins, clips or nail polish. Do not wear lotions, powders, or perfumes. You may wear deodorant. Do not shave 48 hours prior to surgery. Men may shave face and neck. Do not bring valuables to the hospital.    [facility] is not responsible for any belongings or valuables.  Contacts,  dentures or bridgework may not be worn into surgery. Leave your suitcase in the car. After surgery it may be brought to your room. For patients admitted to the hospital, discharge time is determined by your treatment team.   Patients discharged the day of surgery will not be allowed to drive home.   Please read over the following fact sheets that you were given:    __x__ Take these medicines the morning of surgery with A SIP OF WATER:    1. acetaminophen (TYLENOL) 500 MG tablet  2. finasteride (PROSCAR) 5 MG tablet  3. isosorbide mononitrate (IMDUR) 30 MG 24 hr tablet  4.metoprolol succinate (TOPROL-XL) 50 MG 24 hr tablet  5.omeprazole (PRILOSEC) 40 MG capsule  6.tamsulosin (  FLOMAX) 0.4 MG CAPS capsule  ____ Fleet Enema (as directed)   ____ Use CHG Soap as directed  __x__ Use inhalers on the day of surgery Fluticasone-Salmeterol (ADVAIR DISKUS) 250-50 MCG/DOSE AEPB  ____ Stop metformin 2 days prior to surgery    ____ Take 1/2 of usual insulin dose the night before surgery. No insulin the morning          of surgery.   ____ Stop Coumadin/Plavix/aspirin on   ____ Stop Anti-inflammatories on    __x__ Stop supplements until after surgery.  Misc Natural Products (TART CHERRY ADVANCED PO)  ____ Bring C-Pap to the hospital.

## 2017-10-17 MED ORDER — CEFAZOLIN SODIUM-DEXTROSE 2-4 GM/100ML-% IV SOLN
2.0000 g | INTRAVENOUS | Status: AC
  Administered 2017-10-18: 2 g via INTRAVENOUS

## 2017-10-18 ENCOUNTER — Ambulatory Visit: Payer: Medicare Other | Admitting: Anesthesiology

## 2017-10-18 ENCOUNTER — Encounter
Admission: RE | Disposition: A | Discharge status: Home / Self Care | Payer: Self-pay | Source: Ambulatory Visit | Attending: General Surgery

## 2017-10-18 ENCOUNTER — Ambulatory Visit
Admission: RE | Admit: 2017-10-18 | Discharge: 2017-10-18 | Disposition: A | Discharge status: Home / Self Care | Payer: Medicare Other | Source: Ambulatory Visit | Attending: General Surgery | Admitting: General Surgery

## 2017-10-18 DIAGNOSIS — K8012 Calculus of gallbladder with acute and chronic cholecystitis without obstruction: Secondary | ICD-10-CM | POA: Diagnosis not present

## 2017-10-18 DIAGNOSIS — N401 Enlarged prostate with lower urinary tract symptoms: Secondary | ICD-10-CM | POA: Insufficient documentation

## 2017-10-18 DIAGNOSIS — K219 Gastro-esophageal reflux disease without esophagitis: Secondary | ICD-10-CM | POA: Insufficient documentation

## 2017-10-18 DIAGNOSIS — N138 Other obstructive and reflux uropathy: Secondary | ICD-10-CM | POA: Diagnosis not present

## 2017-10-18 DIAGNOSIS — Z8719 Personal history of other diseases of the digestive system: Secondary | ICD-10-CM | POA: Insufficient documentation

## 2017-10-18 DIAGNOSIS — D649 Anemia, unspecified: Secondary | ICD-10-CM | POA: Diagnosis not present

## 2017-10-18 DIAGNOSIS — Z87891 Personal history of nicotine dependence: Secondary | ICD-10-CM | POA: Diagnosis not present

## 2017-10-18 DIAGNOSIS — N400 Enlarged prostate without lower urinary tract symptoms: Secondary | ICD-10-CM | POA: Insufficient documentation

## 2017-10-18 DIAGNOSIS — Z8711 Personal history of peptic ulcer disease: Secondary | ICD-10-CM | POA: Diagnosis not present

## 2017-10-18 DIAGNOSIS — E669 Obesity, unspecified: Secondary | ICD-10-CM | POA: Diagnosis not present

## 2017-10-18 DIAGNOSIS — J449 Chronic obstructive pulmonary disease, unspecified: Secondary | ICD-10-CM | POA: Diagnosis not present

## 2017-10-18 DIAGNOSIS — Z79899 Other long term (current) drug therapy: Secondary | ICD-10-CM | POA: Diagnosis not present

## 2017-10-18 DIAGNOSIS — R0602 Shortness of breath: Secondary | ICD-10-CM | POA: Diagnosis not present

## 2017-10-18 DIAGNOSIS — L409 Psoriasis, unspecified: Secondary | ICD-10-CM | POA: Insufficient documentation

## 2017-10-18 DIAGNOSIS — K828 Other specified diseases of gallbladder: Secondary | ICD-10-CM | POA: Diagnosis not present

## 2017-10-18 DIAGNOSIS — E785 Hyperlipidemia, unspecified: Secondary | ICD-10-CM | POA: Insufficient documentation

## 2017-10-18 DIAGNOSIS — E119 Type 2 diabetes mellitus without complications: Secondary | ICD-10-CM | POA: Insufficient documentation

## 2017-10-18 DIAGNOSIS — I10 Essential (primary) hypertension: Secondary | ICD-10-CM | POA: Insufficient documentation

## 2017-10-18 DIAGNOSIS — Z6827 Body mass index (BMI) 27.0-27.9, adult: Secondary | ICD-10-CM | POA: Diagnosis not present

## 2017-10-18 HISTORY — PX: CHOLECYSTECTOMY: SHX55

## 2017-10-18 SURGERY — LAPAROSCOPIC CHOLECYSTECTOMY
Anesthesia: General | Wound class: Clean Contaminated

## 2017-10-18 MED ORDER — BUPIVACAINE-EPINEPHRINE (PF) 0.5% -1:200000 IJ SOLN
INTRAMUSCULAR | Status: DC | PRN
  Administered 2017-10-18: 13 mL via PERINEURAL

## 2017-10-18 MED ORDER — LACTATED RINGERS IV SOLN
INTRAVENOUS | Status: DC
  Administered 2017-10-18: 07:00:00 via INTRAVENOUS

## 2017-10-18 MED ORDER — SUCCINYLCHOLINE CHLORIDE 20 MG/ML IJ SOLN
INTRAMUSCULAR | Status: AC
  Filled 2017-10-18: qty 1

## 2017-10-18 MED ORDER — ROCURONIUM BROMIDE 100 MG/10ML IV SOLN
INTRAVENOUS | Status: DC | PRN
  Administered 2017-10-18: 5 mg via INTRAVENOUS
  Administered 2017-10-18: 25 mg via INTRAVENOUS
  Administered 2017-10-18 (×2): 10 mg via INTRAVENOUS

## 2017-10-18 MED ORDER — FENTANYL CITRATE (PF) 100 MCG/2ML IJ SOLN
INTRAMUSCULAR | Status: DC | PRN
  Administered 2017-10-18 (×2): 50 ug via INTRAVENOUS
  Administered 2017-10-18: 100 ug via INTRAVENOUS

## 2017-10-18 MED ORDER — TRAMADOL HCL 50 MG PO TABS
ORAL_TABLET | ORAL | Status: AC
  Administered 2017-10-18: 50 mg via ORAL
  Filled 2017-10-18: qty 1

## 2017-10-18 MED ORDER — TRAMADOL HCL 50 MG PO TABS: 50.0000 mg | ORAL_TABLET | Freq: Four times a day (QID) | ORAL | 0 refills | Status: AC | PRN

## 2017-10-18 MED ORDER — EPHEDRINE SULFATE 50 MG/ML IJ SOLN
INTRAMUSCULAR | Status: DC | PRN
  Administered 2017-10-18 (×2): 5 mg via INTRAVENOUS

## 2017-10-18 MED ORDER — ONDANSETRON HCL 4 MG/2ML IJ SOLN
INTRAMUSCULAR | Status: AC
  Filled 2017-10-18: qty 2

## 2017-10-18 MED ORDER — DEXAMETHASONE SODIUM PHOSPHATE 10 MG/ML IJ SOLN
INTRAMUSCULAR | Status: DC | PRN
  Administered 2017-10-18: 10 mg via INTRAVENOUS

## 2017-10-18 MED ORDER — ONDANSETRON HCL 4 MG/2ML IJ SOLN
INTRAMUSCULAR | Status: DC | PRN
  Administered 2017-10-18: 4 mg via INTRAVENOUS

## 2017-10-18 MED ORDER — LIDOCAINE HCL (CARDIAC) 20 MG/ML IV SOLN
INTRAVENOUS | Status: DC | PRN
  Administered 2017-10-18: 80 mg via INTRAVENOUS

## 2017-10-18 MED ORDER — ACETAMINOPHEN 10 MG/ML IV SOLN
INTRAVENOUS | Status: AC
  Filled 2017-10-18: qty 100

## 2017-10-18 MED ORDER — PROPOFOL 10 MG/ML IV BOLUS
INTRAVENOUS | Status: AC
  Filled 2017-10-18: qty 20

## 2017-10-18 MED ORDER — PROPOFOL 10 MG/ML IV BOLUS
INTRAVENOUS | Status: DC | PRN
  Administered 2017-10-18: 150 mg via INTRAVENOUS

## 2017-10-18 MED ORDER — FENTANYL CITRATE (PF) 100 MCG/2ML IJ SOLN
INTRAMUSCULAR | Status: AC
  Filled 2017-10-18: qty 2

## 2017-10-18 MED ORDER — ROCURONIUM BROMIDE 50 MG/5ML IV SOLN
INTRAVENOUS | Status: AC
  Filled 2017-10-18: qty 1

## 2017-10-18 MED ORDER — ONDANSETRON HCL 4 MG/2ML IJ SOLN: 4.0000 mg | Freq: Once | INTRAMUSCULAR | Status: DC | PRN

## 2017-10-18 MED ORDER — TRAMADOL HCL 50 MG PO TABS
50.0000 mg | ORAL_TABLET | Freq: Four times a day (QID) | ORAL | Status: DC
  Administered 2017-10-18: 50 mg via ORAL

## 2017-10-18 MED ORDER — FENTANYL CITRATE (PF) 100 MCG/2ML IJ SOLN: 25.0000 ug | INTRAMUSCULAR | Status: DC | PRN

## 2017-10-18 MED ORDER — ACETAMINOPHEN 10 MG/ML IV SOLN
INTRAVENOUS | Status: DC | PRN
  Administered 2017-10-18: 1000 mg via INTRAVENOUS

## 2017-10-18 MED ORDER — SUCCINYLCHOLINE CHLORIDE 20 MG/ML IJ SOLN
INTRAMUSCULAR | Status: DC | PRN
  Administered 2017-10-18: 100 mg via INTRAVENOUS

## 2017-10-18 MED ORDER — CEFAZOLIN SODIUM-DEXTROSE 2-4 GM/100ML-% IV SOLN
INTRAVENOUS | Status: AC
  Filled 2017-10-18: qty 100

## 2017-10-18 MED ORDER — SUGAMMADEX SODIUM 200 MG/2ML IV SOLN
INTRAVENOUS | Status: DC | PRN
  Administered 2017-10-18: 175 mg via INTRAVENOUS

## 2017-10-18 MED ORDER — DEXAMETHASONE SODIUM PHOSPHATE 10 MG/ML IJ SOLN
INTRAMUSCULAR | Status: AC
  Filled 2017-10-18: qty 1

## 2017-10-18 MED ORDER — PHENYLEPHRINE HCL 10 MG/ML IJ SOLN
INTRAMUSCULAR | Status: DC | PRN
  Administered 2017-10-18 (×3): 100 ug via INTRAVENOUS

## 2017-10-18 MED ORDER — PHENYLEPHRINE HCL 10 MG/ML IJ SOLN
INTRAMUSCULAR | Status: AC
  Filled 2017-10-18: qty 1

## 2017-10-18 MED ORDER — EPHEDRINE SULFATE 50 MG/ML IJ SOLN
INTRAMUSCULAR | Status: AC
  Filled 2017-10-18: qty 1

## 2017-10-18 MED ORDER — LIDOCAINE HCL (PF) 2 % IJ SOLN
INTRAMUSCULAR | Status: AC
  Filled 2017-10-18: qty 10

## 2017-10-18 MED ORDER — BUPIVACAINE-EPINEPHRINE (PF) 0.5% -1:200000 IJ SOLN
INTRAMUSCULAR | Status: AC
  Filled 2017-10-18: qty 30

## 2017-10-18 SURGICAL SUPPLY — 35 items
APPLIER CLIP LOGIC TI 5 (MISCELLANEOUS) ×3 IMPLANT
BLADE SURG SZ11 CARB STEEL (BLADE) ×3 IMPLANT
CANISTER SUCT 1200ML W/VALVE (MISCELLANEOUS) ×3 IMPLANT
CHLORAPREP W/TINT 26ML (MISCELLANEOUS) ×3 IMPLANT
DERMABOND ADVANCED (GAUZE/BANDAGES/DRESSINGS) ×2
DERMABOND ADVANCED .7 DNX12 (GAUZE/BANDAGES/DRESSINGS) ×1 IMPLANT
DRAPE SHEET LG 3/4 BI-LAMINATE (DRAPES) IMPLANT
ELECT REM PT RETURN 9FT ADLT (ELECTROSURGICAL) ×3
ELECTRODE REM PT RTRN 9FT ADLT (ELECTROSURGICAL) ×1 IMPLANT
GLOVE BIO SURGEON STRL SZ 6.5 (GLOVE) ×2 IMPLANT
GLOVE BIO SURGEONS STRL SZ 6.5 (GLOVE) ×1
GOWN STRL REUS W/ TWL LRG LVL3 (GOWN DISPOSABLE) ×4 IMPLANT
GOWN STRL REUS W/TWL LRG LVL3 (GOWN DISPOSABLE) ×8
GRASPER SUT TROCAR 14GX15 (MISCELLANEOUS) ×3 IMPLANT
HEMOSTAT SURGICEL 2X3 (HEMOSTASIS) ×6 IMPLANT
IRRIGATION STRYKERFLOW (MISCELLANEOUS) ×1 IMPLANT
IRRIGATOR STRYKERFLOW (MISCELLANEOUS) ×3
IV NS 1000ML (IV SOLUTION) ×2
IV NS 1000ML BAXH (IV SOLUTION) ×1 IMPLANT
KIT TURNOVER KIT A (KITS) ×3 IMPLANT
LABEL OR SOLS (LABEL) ×3 IMPLANT
NEEDLE HYPO 25X1 1.5 SAFETY (NEEDLE) ×3 IMPLANT
NEEDLE INSUFFLATION 14GA 120MM (NEEDLE) ×3 IMPLANT
NS IRRIG 500ML POUR BTL (IV SOLUTION) ×3 IMPLANT
PACK LAP CHOLECYSTECTOMY (MISCELLANEOUS) ×3 IMPLANT
PENCIL ELECTRO HAND CTR (MISCELLANEOUS) IMPLANT
POUCH SPECIMEN RETRIEVAL 10MM (ENDOMECHANICALS) ×3 IMPLANT
SCISSORS METZENBAUM CVD 33 (INSTRUMENTS) ×3 IMPLANT
SLEEVE ENDOPATH XCEL 5M (ENDOMECHANICALS) ×6 IMPLANT
SUT MNCRL AB 4-0 PS2 18 (SUTURE) ×3 IMPLANT
SUT VIC AB 0 CT1 36 (SUTURE) ×3 IMPLANT
SUT VICRYL 0 AB UR-6 (SUTURE) ×3 IMPLANT
TROCAR XCEL NON-BLD 11X100MML (ENDOMECHANICALS) ×3 IMPLANT
TROCAR XCEL NON-BLD 5MMX100MML (ENDOMECHANICALS) ×3 IMPLANT
TUBING INSUFFLATION (TUBING) ×3 IMPLANT

## 2017-10-18 NOTE — Interval H&P Note (Signed)
History and Physical Interval Note:  10/18/2017 7:01 AM  Brian Callahan  has presented today for surgery, with the diagnosis of calculus of gallbladder  The various methods of treatment have been discussed with the patient and family. After consideration of risks, benefits and other options for treatment, the patient has consented to  Procedure(s): LAPAROSCOPIC CHOLECYSTECTOMY (N/A) as a surgical intervention .  The patient's history has been reviewed, patient examined, no change in status, stable for surgery.  I have reviewed the patient's chart and labs.  Questions were answered to the patient's satisfaction.     [name]

## 2017-10-18 NOTE — Transfer of Care (Signed)
Immediate Anesthesia Transfer of Care Note  Patient: Brian Callahan  Procedure(s) Performed: LAPAROSCOPIC CHOLECYSTECTOMY (N/A )  Patient Location: PACU  Anesthesia Type:General  Level of Consciousness: sedated  Airway & Oxygen Therapy: Patient Spontanous Breathing and Patient connected to face mask oxygen  Post-op Assessment: Report given to RN and Post -op Vital signs reviewed and stable  Post vital signs: Reviewed and stable  Last Vitals:  Vitals Value Taken Time  BP 139/81 10/18/2017  9:29 AM  Temp 36.5 C 10/18/2017  9:29 AM  Pulse 86 10/18/2017  9:29 AM  Resp 23 10/18/2017  9:29 AM  SpO2 100 % 10/18/2017  9:29 AM  Vitals shown include unvalidated device data.  Last Pain:  Vitals:   10/18/17 0656  TempSrc: Tympanic  PainSc: 0-No pain         Complications: No apparent anesthesia complications

## 2017-10-18 NOTE — Anesthesia Procedure Notes (Signed)
Procedure Name: Intubation Date/Time: 10/18/2017 7:40 AM Performed by: Hedda Slade, CRNA Pre-anesthesia Checklist: Patient identified, Patient being monitored, Timeout performed, Emergency Drugs available and Suction available Patient Re-evaluated:Patient Re-evaluated prior to induction Oxygen Delivery Method: Circle system utilized Preoxygenation: Pre-oxygenation with 100% oxygen Induction Type: IV induction Ventilation: Mask ventilation without difficulty and Oral airway inserted - appropriate to patient size Laryngoscope Size: Mac and 4 Grade View: Grade I Tube type: Oral Tube size: 7.5 mm Number of attempts: 1 Airway Equipment and Method: Stylet Placement Confirmation: ETT inserted through vocal cords under direct vision,  positive ETCO2 and breath sounds checked- equal and bilateral Secured at: 22 cm Tube secured with: Tape Dental Injury: Teeth and Oropharynx as per pre-operative assessment

## 2017-10-18 NOTE — Discharge Instructions (Signed)
° ° ° ° ° °  AMBULATORY SURGERY  DISCHARGE INSTRUCTIONS   1) The drugs that you were given will stay in your system until tomorrow so for the next 24 hours you should not:  A) Drive an automobile B) Make any legal decisions C) Drink any alcoholic beverage   2) You may resume regular meals tomorrow.  Today it is better to start with liquids and gradually work up to solid foods.  You may eat anything you prefer, but it is better to start with liquids, then soup and crackers, and gradually work up to solid foods.   3) Please notify your doctor immediately if you have any unusual bleeding, trouble breathing, redness and pain at the surgery site, drainage, fever, or pain not relieved by medication.    4) Additional Instructions:        Please contact your physician with any problems or Same Day Surgery at [phone], Monday through Friday 6 am to 4 pm, or [place] [facility] number at [phone]. Diet: Resume home heart healthy regular diet.   Activity: No heavy lifting >20 pounds (children, pets, laundry, garbage) or strenuous activity until follow-up, but light activity and walking are encouraged. Do not drive or drink alcohol if taking narcotic pain medications.  Wound care: May shower with soapy water and pat dry (do not rub incisions), but no baths or submerging incision underwater until follow-up. (no swimming)   Medications: Resume all home medications. For mild to moderate pain: acetaminophen (Tylenol). Combining Tylenol with alcohol can substantially increase your risk of causing liver disease. Narcotic pain medications, if prescribed, can be used for severe pain, though may cause nausea, constipation, and drowsiness. If you do not need the narcotic pain medication, you do not need to fill the prescription.  Call office [phone]) at any time if any questions, worsening pain, fevers/chills, bleeding, drainage from incision site, or other concerns.

## 2017-10-18 NOTE — Anesthesia Post-op Follow-up Note (Signed)
Anesthesia QCDR form completed.        

## 2017-10-18 NOTE — Op Note (Signed)
Preoperative diagnosis: Symptomatic cholelithiasis  Postoperative diagnosis: Symptomatic cholelithiasis.  Procedure: Laparoscopic Cholecystectomy.   Anesthesia: GETA   Surgeon: Dr. Windell Moment  Wound Classification: Clean Contaminated  Indications: Patient is a 77 y.o. male developed right upper quadrant pain and nausea and on workup was found to have a gallbladder full of stone with a normal common duct. Laparoscopic cholecystectomy was elected.  Findings: Critical view of safety achieved Cystic duct and artery identified, ligated and divided Adequate hemostasis  Description of procedure: The patient was placed on the operating table in the supine position. General anesthesia was induced. A time-out was completed verifying correct patient, procedure, site, positioning, and implant(s) and/or special equipment prior to beginning this procedure. An orogastric tube was placed. The abdomen was prepped and draped in the usual sterile fashion.  Due to the midline scar, the veres needle was inserted on the left upper quadrant. Proper position was confirmed by aspiration and saline meniscus test. The abdomen was insufflated with carbon dioxide to a pressure of 15 mmHg. The patient tolerated insufflation well. An incision was make just to the left of the midline and a 3mm Optiview trocar was inserted under direct visualization. No organ or vascular structure injury was seen.   Inspection of the abdominal cavity shows a loop of bowel that attached to the umbilicus. A small incision was made to the right of the navel.  A 11-mm trocar was then inserted under direct visualization.   Additional trocars were then inserted in the following locations: two 5-mm trocars along the right costal margin. The abdomen was inspected and no abnormalities were found. The table was placed in the reverse Trendelenburg position with the right side up.  Filmy adhesions between the gallbladder and omentum, duodenum and  transverse colon were lysed sharply. The dome of the gallbladder was grasped with an atraumatic grasper passed through the lateral port and retracted over the dome of the liver. The infundibulum was also grasped with an atraumatic grasper through the midclavicular port and retracted toward the right lower quadrant. This maneuver exposed Calot's triangle. The peritoneum overlying the gallbladder infundibulum was then incised and the cystic duct and cystic artery identified and circumferentially dissected. A time out was done to review the structures of the critical view of safety. The cystic duct and cystic artery were then doubly clipped and divided close to the gallbladder.  The gallbladder was then dissected from its peritoneal attachments by electrocautery. Hemostasis was checked and the gallbladder and contained stones were removed using an endoscopic retrieval bag placed through the peri-umbilical port. The gallbladder was passed off the table as a specimen. The gallbladder fossa was copiously irrigated with saline and hemostasis was obtained. There was no evidence of bleeding from the gallbladder fossa or cystic artery or leakage of the bile from the cystic duct stump. Secondary trocars were removed under direct vision. No bleeding was noted. The laparoscope was withdrawn and the umbilical trocar removed. The abdomen was allowed to collapse. The fascia of the 2mm trocar sites was closed with figure-of-eight 0 vicryl sutures. The skin was closed with subcuticular sutures of 4-0 monocryl and topical skin adhesive. The orogastric tube was removed.  The patient tolerated the procedure well and was taken to the postanesthesia care unit in stable condition.   Specimen: Gallbladder  Complications: None  EBL: 54mL

## 2017-10-18 NOTE — Anesthesia Preprocedure Evaluation (Signed)
Anesthesia Evaluation  Patient identified by MRN, date of birth, ID band Patient awake    Reviewed: Allergy & Precautions, NPO status , Patient's Chart, lab work & pertinent test results  History of Anesthesia Complications Negative for: history of anesthetic complications  Airway Mallampati: I       Dental  (+) Poor Dentition, Chipped, Missing, Dental Advidsory Given   Pulmonary shortness of breath and with exertion, neg sleep apnea, COPD, neg recent URI, former smoker,     + decreased breath sounds      Cardiovascular Exercise Tolerance: Good hypertension, Pt. on home beta blockers (-) angina+ CAD  (-) Past MI, (-) Cardiac Stents and (-) CABG (-) dysrhythmias (-) Valvular Problems/Murmurs     Neuro/Psych    GI/Hepatic Neg liver ROS, GERD  Medicated,  Endo/Other  diabetes, Well Controlled, Type 2  Renal/GU negative Renal ROS     Musculoskeletal   Abdominal Normal abdominal exam  (+)   Peds negative pediatric ROS (+)  Hematology negative hematology ROS (+) anemia ,   Anesthesia Other Findings Past Medical History: No date: Anemia 05/23/2015: BP (high blood pressure) No date: BPH with obstruction/lower urinary tract symptoms No date: COPD (chronic obstructive pulmonary disease) (HCC) No date: Dyspnea No date: Encounter for long-term (current) use of other high-risk  medications No date: GERD (gastroesophageal reflux disease) No date: GI bleed No date: Gout mid-90s: H/O hernia repair     Comment:  x2 No date: History of elevated PSA No date: History of Rocky Mountain spotted fever No date: HLD (hyperlipidemia) No date: HTN (hypertension) No date: Hyperglycemia No date: Hyperuricemia No date: Incomplete bladder emptying No date: Obesity No date: Psoriasis No date: Psoriasis No date: Urinary frequency   Reproductive/Obstetrics                             Anesthesia  Physical  Anesthesia Plan  ASA: II  Anesthesia Plan: General   Post-op Pain Management:    Induction: Intravenous  PONV Risk Score and Plan: 2 and Ondansetron and Dexamethasone  Airway Management Planned: Oral ETT  Additional Equipment:   Intra-op Plan:   Post-operative Plan: Extubation in OR  Informed Consent: I have reviewed the patients History and Physical, chart, labs and discussed the procedure including the risks, benefits and alternatives for the proposed anesthesia with the patient or authorized representative who has indicated his/her understanding and acceptance.     Plan Discussed with: CRNA  Anesthesia Plan Comments:         Anesthesia Quick Evaluation

## 2017-10-19 LAB — SURGICAL PATHOLOGY

## 2017-10-19 NOTE — Anesthesia Postprocedure Evaluation (Signed)
Anesthesia Post Note  Patient: DESHUN SEDIVY  Procedure(s) Performed: LAPAROSCOPIC CHOLECYSTECTOMY (N/A )  Patient location during evaluation: PACU Anesthesia Type: General Level of consciousness: awake and alert Pain management: pain level controlled Vital Signs Assessment: post-procedure vital signs reviewed and stable Respiratory status: spontaneous breathing, nonlabored ventilation, respiratory function stable and patient connected to nasal cannula oxygen Cardiovascular status: blood pressure returned to baseline and stable Postop Assessment: no apparent nausea or vomiting Anesthetic complications: no     Last Vitals:  Vitals:   10/18/17 1028 10/18/17 1130  BP: 140/71 117/62  Pulse: 65 66  Resp: 16 16  Temp: 36.6 C   SpO2: 95% 94%    Last Pain:  Vitals:   10/18/17 1028  TempSrc: Temporal                 Martha Clan

## 2017-11-08 DIAGNOSIS — K2950 Unspecified chronic gastritis without bleeding: Secondary | ICD-10-CM | POA: Insufficient documentation

## 2017-11-08 DIAGNOSIS — K2100 Gastro-esophageal reflux disease with esophagitis, without bleeding: Secondary | ICD-10-CM | POA: Insufficient documentation

## 2017-11-22 ENCOUNTER — Other Ambulatory Visit: Payer: Self-pay | Admitting: Family Medicine

## 2017-11-22 DIAGNOSIS — R972 Elevated prostate specific antigen [PSA]: Secondary | ICD-10-CM

## 2017-11-23 ENCOUNTER — Other Ambulatory Visit: Payer: Medicare Other

## 2017-11-29 ENCOUNTER — Telehealth: Payer: Self-pay

## 2017-11-29 ENCOUNTER — Ambulatory Visit: Payer: Medicare Other | Admitting: Urology

## 2017-11-29 DIAGNOSIS — R079 Chest pain, unspecified: Secondary | ICD-10-CM | POA: Insufficient documentation

## 2017-11-29 DIAGNOSIS — R9439 Abnormal result of other cardiovascular function study: Secondary | ICD-10-CM | POA: Insufficient documentation

## 2017-11-29 NOTE — Telephone Encounter (Signed)
lmov to let patient know cd of echo imaging and copy of ekg is ready for pick up at [facility] .  Patient should pick up prior to lunch tomorrow.

## 2017-11-29 NOTE — Progress Notes (Signed)
Cardiology Office Note  Date:  11/30/2017   ID:  Brian Callahan, Brian Callahan Oct 23, 1940, MRN [id number]  PCP:  Idelle Crouch, MD   Chief Complaint  Patient presents with  . other    Abnormal stress test. Meds reviewed verbally with pt.    HPI:  [name] is a 77 year old gentleman with past medical history of former smoker, quit 25 years ago Abnormal stress tests, Chest pain Prior GI bleed requiring hospitalization, mesenteric artery embolization Gastritis, GERD shows normal sinus rhythm with rate 72 bpm no significant ST or T wave changes Shortness of breath on exertion Hyperlipidemia Who presents by referral from Dr. Georgie Chard for consultation of his chest pain  Previously seen by cardiology 09/28/2017 at [facility] substernal chest discomfort, described as pressure-like in sensation, which would come and go with and without exertion, lasting about 10 minutes, occurring 1-2 times per week. The chest discomfort was not associated with nausea, vomiting, or diaphoresis. He also reported a 27-month history of worsening exertional dyspnea.   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   [place] 07/15/2017 revealed LV ejection fraction of 63% with mild inferior wall ischemia. "Mild perfusion abnormality of mild intensity"  Was treated with Imdur  10/18/17 had gall bladder out Treated by Dr. Verlee Rossetti well, no chest pain, no ABD pain Stopped imdur, no change in sx  EKG personally reviewed by myself on todays visit Shows   Prior heart cath 2010 or 2011 "95% open in all your arteries"  Restarting the [facility] tomorrow Cut back on beer Weight down 9 pounds past few months  EKG personally reviewed by myself on todays visit Shows normal sinus rhythm rate 72 bpm no significant ST or T wave changes   PMH:   has a past medical history of Anemia, BP (high blood pressure) (05/23/2015), BPH with obstruction/lower urinary tract  symptoms, COPD (chronic obstructive pulmonary disease) ([place]), Dyspnea, Encounter for long-term (current) use of other high-risk medications, GERD (gastroesophageal reflux disease), GI bleed, Gout, H/O hernia repair (mid-90s), History of elevated PSA, History of Rocky Mountain spotted fever, HLD (hyperlipidemia), HTN (hypertension), Hyperglycemia, Hyperuricemia, Incomplete bladder emptying, Obesity, Psoriasis, Psoriasis, and Urinary frequency.  PSH:    Past Surgical History:  Procedure Laterality Date  . APPENDECTOMY    . BACK SURGERY     neck and spine 2007  . CERVICAL SPINE SURGERY  2007  . CHOLECYSTECTOMY N/A 10/18/2017   Procedure: LAPAROSCOPIC CHOLECYSTECTOMY;  Surgeon: [name], MD;  Location: [facility] ORS;  Service: General;  Laterality: N/A;  . COLECTOMY  1998   partial, due to obstruction  . COLONOSCOPY     lost 10 units of blood  . ESOPHAGOGASTRODUODENOSCOPY N/A 10/14/2016   Procedure: ESOPHAGOGASTRODUODENOSCOPY (EGD);  Surgeon: [name], MD;  Location: [facility] ENDOSCOPY;  Service: Endoscopy;  Laterality: N/A;  . EYE SURGERY     bilateral cataracts  . HERNIA REPAIR  mid-90s   x2  inguinal  . PROSTATE BIOPSY  2013  . SURGERY SCROTAL / TESTICULAR     for fibrous growth    Current Outpatient Medications  Medication Sig Dispense Refill  . acetaminophen (TYLENOL) 500 MG tablet Take 500-1,000 mg by mouth every 6 (six) hours as needed (for headache/minor pain.).    . Adalimumab 40 MG/0.8ML PNKT Inject 40 mg into the skin every 14 (fourteen) days. Thursdays    . albuterol (PROVENTIL HFA;VENTOLIN HFA) 108 (90 Base) MCG/ACT inhaler Inhale 1-2 puffs into the lungs every  6 (six) hours as needed for wheezing or shortness of breath.     . betamethasone dipropionate (DIPROLENE) 0.05 % cream Apply 1 application topically 2 (two) times daily as needed (for psoriasis).    . Ca Carbonate-Mag Hydroxide (ROLAIDS PO) Take 2 tablets by mouth 3 (three) times daily as needed (for  indigestion/heartburn).    . calcium carbonate (TUMS EX) 750 MG chewable tablet Chew 1-2 tablets by mouth 3 (three) times daily as needed for heartburn. GUARDIAN ANTACID    . CALCIUM-MAGNESIUM PO Take 1-2 tablets by mouth 3 (three) times daily as needed (FOR INDIGESTION/HEARTBURN).    Marland Kitchen enalapril (VASOTEC) 5 MG tablet Take 5 mg daily by mouth.     . ezetimibe (ZETIA) 10 MG tablet Take 10 mg by mouth daily.    . finasteride (PROSCAR) 5 MG tablet TAKE 1 TABLET BY MOUTH  DAILY (Patient taking differently: TAKE 1 TABLET (5 MG)  BY MOUTH  DAILY) 90 tablet 3  . Fluticasone-Salmeterol (ADVAIR DISKUS) 250-50 MCG/DOSE AEPB Inhale 1 puff 2 (two) times daily into the lungs.     . isosorbide mononitrate (IMDUR) 30 MG 24 hr tablet Take 30 mg by mouth daily.    . Melatonin 5 MG CAPS Take 5 mg by mouth at bedtime as needed (for sleep.).     Marland Kitchen metoprolol succinate (TOPROL-XL) 50 MG 24 hr tablet Take 50 mg by mouth daily. Take with or immediately following a meal.    . Misc Natural Products (TART CHERRY ADVANCED PO) Take 12 mg by mouth daily.     . Multiple Vitamins-Minerals (PRESERVISION AREDS 2 PO) Take 2 capsules by mouth daily.     Marland Kitchen omeprazole (PRILOSEC) 40 MG capsule Take 40 mg by mouth daily before breakfast.     . tamsulosin (FLOMAX) 0.4 MG CAPS capsule TAKE 1 CAPSULE BY MOUTH  DAILY (Patient taking differently: TAKE 1 CAPSULE (0.4 MG) BY MOUTH  DAILY) 90 capsule 3   No current facility-administered medications for this visit.      Allergies:   Shellfish allergy   Social History:  The patient  reports that he quit smoking about 27 years ago. He has never used smokeless tobacco. He reports that he drinks alcohol. He reports that he does not use drugs.   Family History:   family history includes Kidney failure in his brother.    Review of Systems: Review of Systems  Constitutional: Negative.   Respiratory: Negative.   Cardiovascular: Negative.   Gastrointestinal: Negative.   Musculoskeletal:  Negative.   Neurological: Negative.   Psychiatric/Behavioral: Negative.   All other systems reviewed and are negative.    PHYSICAL EXAM: VS:  BP 126/74 (BP Location: Right Arm, Patient Position: Sitting, Cuff Size: Normal)   Pulse 72   Ht 5\' 7"  (1.702 m)   Wt 175 lb 8 oz (79.6 kg)   BMI 27.49 kg/m  , BMI Body mass index is 27.49 kg/m. GEN: Well nourished, well developed, in no acute distress  HEENT: normal  Neck: no JVD, carotid bruits, or masses Cardiac: RRR; no murmurs, rubs, or gallops,no edema  Respiratory:  clear to auscultation bilaterally, normal work of breathing GI: soft, nontender, nondistended, + BS MS: no deformity or atrophy  Skin: warm and dry, no rash Neuro:  Strength and sensation are intact Psych: euthymic mood, full affect   Recent Labs: No results found for requested labs within last 8760 hours.    Lipid Panel No results found for: CHOL, HDL, LDLCALC, TRIG  Wt Readings from Last 3 Encounters:  11/30/17 175 lb 8 oz (79.6 kg)  10/18/17 177 lb (80.3 kg)  10/11/17 177 lb (80.3 kg)       ASSESSMENT AND PLAN:  Gastrointestinal hemorrhage, unspecified gastrointestinal hemorrhage type Reports having 2 GI bleeds in the past One in the colon requiring 3 clips by Dr. Vira Agar No recurrent bleeding  Chest pain with moderate risk for cardiac etiology - Plan: EKG 12-Lead Denies any significant chest pain Risk factors relatively well-controlled including nonsmoker, diabetes numbers well-controlled, now losing weight, cholesterol very reasonable No plan for further testing  Positive cardiac stress test - Plan: EKG 12-Lead Very small region mild perfusion defect No symptoms of angina on today's visit No further testing needed at this time Recommended if he starts to have anginal symptoms that he call our office Would consider a test to confirm ischemia such as cardiac CTA Could even do CT coronary calcium score for risk stratification He will call us  for any symptoms  Essential hypertension Blood pressure is well controlled on today's visit. No changes made to the medications.  Mixed hyperlipidemia Recommended weight loss, restarting his exercise program that she is scheduled to do tomorrow  Anemia, unspecified type  Numbers are stable  Disposition:   F/U As needed  Significant review of prior records Hard of hearing, long discussion with him today  Total encounter time more than 60 minutes  Greater than 50% was spent in counseling and coordination of care with the patient    Orders Placed This Encounter  Procedures  . EKG 12-Lead     Signed, Esmond Plants, M.D., Ph.D. 11/30/2017  [place], [place]

## 2017-11-30 ENCOUNTER — Encounter: Payer: Self-pay | Admitting: Cardiovascular Disease

## 2017-11-30 ENCOUNTER — Ambulatory Visit: Payer: Medicare Other | Admitting: Cardiovascular Disease

## 2017-11-30 ENCOUNTER — Ambulatory Visit: Payer: Medicare Other | Admitting: Urology

## 2017-11-30 VITALS — BP 126/74 | HR 72 | Ht 67.0 in | Wt 175.5 lb

## 2017-11-30 DIAGNOSIS — E782 Mixed hyperlipidemia: Secondary | ICD-10-CM | POA: Diagnosis not present

## 2017-11-30 DIAGNOSIS — I10 Essential (primary) hypertension: Secondary | ICD-10-CM | POA: Diagnosis not present

## 2017-11-30 DIAGNOSIS — K922 Gastrointestinal hemorrhage, unspecified: Secondary | ICD-10-CM | POA: Diagnosis not present

## 2017-11-30 DIAGNOSIS — D649 Anemia, unspecified: Secondary | ICD-10-CM | POA: Diagnosis not present

## 2017-11-30 DIAGNOSIS — R9439 Abnormal result of other cardiovascular function study: Secondary | ICD-10-CM | POA: Diagnosis not present

## 2017-11-30 DIAGNOSIS — R079 Chest pain, unspecified: Secondary | ICD-10-CM

## 2017-11-30 NOTE — Patient Instructions (Addendum)
Medication Instructions:   No medication changes made  Labwork:  No new labs needed  Testing/Procedures:  If you have more chest pain Call the office We could do a CT coronary calcium score  Or cardiac CTA to look for blockages   Follow-Up: It was a pleasure seeing you in the office today. Please call us if you have new issues that need to be addressed before your next appt.  [phone]  Your physician wants you to follow-up in:  As needed  If you need a refill on your cardiac medications before your next appointment, please call your pharmacy.  For educational health videos Log in to : [email or web address] Or : [email or web address], password : [email or web address]

## 2017-12-30 ENCOUNTER — Other Ambulatory Visit: Payer: Medicare Other

## 2017-12-30 DIAGNOSIS — R972 Elevated prostate specific antigen [PSA]: Secondary | ICD-10-CM

## 2017-12-31 LAB — PSA: Prostate Specific Ag, Serum: 2.2 ng/mL (ref 0.0–4.0)

## 2018-01-03 ENCOUNTER — Ambulatory Visit: Payer: Medicare Other | Admitting: Urology

## 2018-01-05 ENCOUNTER — Ambulatory Visit: Payer: Medicare Other | Admitting: Urology

## 2018-01-24 NOTE — Progress Notes (Signed)
2:54 PM   Brian Callahan [facility] [birth year] [id number]  Referring provider: Idelle Crouch, MD [facility] [place], [place] [address]  Chief Complaint  Patient presents with  . Benign Prostatic Hypertrophy    HPI: Patient is 77 year old Caucasian male with BPH and LUTS and a history of elevated PSA that is managed with tamsulosin and finasteride who presents today for a 6 month follow up.  History of elevated PSA PSA Trend  bx in 2013 negative - unknown PSA level  4.66 in 2015 - started on finasteride  2.6 (5.2) ng/mL on 04/25/2014  1.6 (3.2) ng/mL on 11/22/2014  1.8 (3.9) ng/mL on 05/23/2015  2.3 (4.6) ng/mL on 11/18/2015  2.0 (2.0) ng/mL on 03/10/2016  2.3 (4.6) ng/mL on 11/19/2016  2.5 (5.0) ng/mL on 06/01/2017  2.2 (4.4) in 12/2017   BPH WITH LUTS His IPSS score today is 4, which is mild lower urinary tract symptomatology.  He is pleased  with his quality life due to his urinary symptoms.  His previous IPSS score was 1/0.  His previous PVR was 125 mL.   He does have more frequency.  He denies any dysuria, hematuria or suprapubic pain.   He is currently taking finasteride 5 mg daily. His has had biopsy in 2013 for which was negative.   He also denies any recent fevers, chills, nausea or vomiting.   He does not have a family history of PCa.  IPSS    Row Name 01/25/18 1400         International Prostate Symptom Score   How often have you had the sensation of not emptying your bladder?  Not at All     How often have you had to urinate less than every two hours?  Less than 1 in 5 times     How often have you found you stopped and started again several times when you urinated?  Not at All     How often have you found it difficult to postpone urination?  Not at All     How often have you had a weak urinary stream?  Less than half the time     How often have you had to strain to start urination?  Not at All     How many times did you typically get up  at night to urinate?  1 Time     Total IPSS Score  4       Quality of Life due to urinary symptoms   If you were to spend the rest of your life with your urinary condition just the way it is now how would you feel about that?  Pleased         Score:  1-7 Mild 8-19 Moderate 20-35 Severe   PMH: Past Medical History:  Diagnosis Date  . Anemia   . BP (high blood pressure) 05/23/2015  . BPH with obstruction/lower urinary tract symptoms   . COPD (chronic obstructive pulmonary disease) ([place])   . Dyspnea   . Encounter for long-term (current) use of other high-risk medications   . GERD (gastroesophageal reflux disease)   . GI bleed   . Gout   . H/O hernia repair mid-90s   x2  . History of elevated PSA   . History of [facility] spotted fever   . HLD (hyperlipidemia)   . HTN (hypertension)   . Hyperglycemia   . Hyperuricemia   . Incomplete bladder emptying   .  Obesity   . Psoriasis   . Psoriasis   . Urinary frequency     Surgical History: Past Surgical History:  Procedure Laterality Date  . APPENDECTOMY    . BACK SURGERY     neck and spine 2007  . CERVICAL SPINE SURGERY  2007  . CHOLECYSTECTOMY N/A 10/18/2017   Procedure: LAPAROSCOPIC CHOLECYSTECTOMY;  Surgeon: [name], MD;  Location: [facility] ORS;  Service: General;  Laterality: N/A;  . COLECTOMY  1998   partial, due to obstruction  . COLONOSCOPY     lost 10 units of blood  . ESOPHAGOGASTRODUODENOSCOPY N/A 10/14/2016   Procedure: ESOPHAGOGASTRODUODENOSCOPY (EGD);  Surgeon: [name], MD;  Location: [facility] ENDOSCOPY;  Service: Endoscopy;  Laterality: N/A;  . EYE SURGERY     bilateral cataracts  . HERNIA REPAIR  mid-90s   x2  inguinal  . PROSTATE BIOPSY  2013  . SURGERY SCROTAL / TESTICULAR     for fibrous growth    Home Medications:  Allergies as of 01/25/2018      Reactions   Shellfish Allergy       Medication List        Accurate as of 01/25/18  2:54 PM. Always use your most recent med  list.          acetaminophen 500 MG tablet Commonly known as:  TYLENOL Take 500-1,000 mg by mouth every 6 (six) hours as needed (for headache/minor pain.).   Adalimumab 40 MG/0.8ML Pnkt Inject 40 mg into the skin every 14 (fourteen) days. Thursdays   ADVAIR DISKUS 250-50 MCG/DOSE Aepb Generic drug:  Fluticasone-Salmeterol Inhale 1 puff 2 (two) times daily into the lungs.   albuterol 108 (90 Base) MCG/ACT inhaler Commonly known as:  PROVENTIL HFA;VENTOLIN HFA Inhale 1-2 puffs into the lungs every 6 (six) hours as needed for wheezing or shortness of breath.   betamethasone dipropionate 0.05 % cream Commonly known as:  DIPROLENE Apply 1 application topically 2 (two) times daily as needed (for psoriasis).   calcium carbonate 750 MG chewable tablet Commonly known as:  TUMS EX Chew 1-2 tablets by mouth 3 (three) times daily as needed for heartburn. GUARDIAN ANTACID   CALCIUM-MAGNESIUM PO Take 1-2 tablets by mouth 3 (three) times daily as needed (FOR INDIGESTION/HEARTBURN).   enalapril 5 MG tablet Commonly known as:  VASOTEC Take 5 mg daily by mouth.   ezetimibe 10 MG tablet Commonly known as:  ZETIA Take 10 mg by mouth daily.   finasteride 5 MG tablet Commonly known as:  PROSCAR TAKE 1 TABLET BY MOUTH  DAILY   isosorbide mononitrate 30 MG 24 hr tablet Commonly known as:  IMDUR Take 30 mg by mouth daily.   Melatonin 5 MG Caps Take 5 mg by mouth at bedtime as needed (for sleep.).   metoprolol succinate 50 MG 24 hr tablet Commonly known as:  TOPROL-XL Take 50 mg by mouth daily. Take with or immediately following a meal.   omeprazole 40 MG capsule Commonly known as:  PRILOSEC Take 40 mg by mouth daily before breakfast.   PRESERVISION AREDS 2 PO Take 2 capsules by mouth daily.   ROLAIDS PO Take 2 tablets by mouth 3 (three) times daily as needed (for indigestion/heartburn).   tamsulosin 0.4 MG Caps capsule Commonly known as:  FLOMAX TAKE 1 CAPSULE BY MOUTH   DAILY   TART CHERRY ADVANCED PO Take 12 mg by mouth daily.       Allergies:  Allergies  Allergen Reactions  . Shellfish Allergy  Family History: Family History  Problem Relation Age of Onset  . Kidney failure Brother   . Prostate cancer Neg Hx     Social History:  reports that he quit smoking about 27 years ago. He has never used smokeless tobacco. He reports that he drinks alcohol. He reports that he does not use drugs.  ROS: UROLOGY Frequent Urination?: No Hard to postpone urination?: No Burning/pain with urination?: No Get up at night to urinate?: No Leakage of urine?: No Urine stream starts and stops?: No Trouble starting stream?: No Do you have to strain to urinate?: No Blood in urine?: No Urinary tract infection?: No Sexually transmitted disease?: No Injury to kidneys or bladder?: No Painful intercourse?: No Weak stream?: No Erection problems?: No Penile pain?: No  Gastrointestinal Nausea?: No Vomiting?: No Indigestion/heartburn?: No Diarrhea?: No Constipation?: No  Constitutional Fever: No Night sweats?: No Weight loss?: No Fatigue?: No  Skin Skin rash/lesions?: No Itching?: No  Eyes Blurred vision?: No Double vision?: No  Ears/Nose/Throat Sore throat?: No Sinus problems?: No  Hematologic/Lymphatic Swollen glands?: No Easy bruising?: No  Cardiovascular Leg swelling?: No Chest pain?: No  Respiratory Cough?: No Shortness of breath?: No  Endocrine Excessive thirst?: No  Musculoskeletal Back pain?: No Joint pain?: No  Neurological Headaches?: No Dizziness?: No  Psychologic Depression?: No Anxiety?: No  Physical Exam: BP (!) 150/104   Pulse 76   Resp 16   Ht 5\' 6"  (1.676 m)   Wt 180 lb 14.4 oz (82.1 kg)   SpO2 96%   BMI 29.20 kg/m   Constitutional: Well nourished. Alert and oriented, No acute distress. HEENT: [place] AT, moist mucus membranes. Trachea midline, no masses. Cardiovascular: No clubbing, cyanosis,  or edema. Respiratory: Normal respiratory effort, no increased work of breathing. GI: Abdomen is soft, non tender, non distended, no abdominal masses. Liver and spleen not palpable.  No hernias appreciated.  Stool sample for occult testing is not indicated.   GU: No CVA tenderness.  No bladder fullness or masses.  Patient with uncircumcised phallus.   Foreskin easily retracted Urethral meatus is patent.  No penile discharge. No penile lesions or rashes. Scrotum without lesions, cysts, rashes and/or edema.  Testicles are located scrotally bilaterally. No masses are appreciated in the testicles. Left and right epididymis are normal. Rectal: Patient with  normal sphincter tone. Anus and perineum without scarring or rashes. No rectal masses are appreciated. Prostate is approximately 45 grams, no nodules are appreciated. Seminal vesicles are normal. Skin: No rashes, bruises or suspicious lesions. Lymph: No cervical or inguinal adenopathy. Neurologic: Grossly intact, no focal deficits, moving all 4 extremities. Psychiatric: Normal mood and affect.    Laboratory Data: See HPI    1. BPH with LUTS  - IPSS score is 4/0, it is slightly worsened  - Continue conservative management, avoiding bladder irritants and timed voiding's  - Continue tamsulosin 0.4 mg and finasteride 5 mg daily; refills not needed at this time  - RTC in 6 months for IPSS, PSA and exam   2. History of elevated PSA  - Current PSA is 2.2 (4.4)  - Return to clinic in 6 months for PSA and exam  Return in about 6 months (around 07/28/2018) for IPSS, PSA and exam.  Zara Council, [facility]  [place] [address] [place], [place] [address] [phone]

## 2018-01-25 ENCOUNTER — Ambulatory Visit: Payer: Medicare Other | Admitting: Urology

## 2018-01-25 ENCOUNTER — Encounter: Payer: Self-pay | Admitting: Urology

## 2018-01-25 VITALS — BP 150/104 | HR 76 | Resp 16 | Ht 66.0 in | Wt 180.9 lb

## 2018-01-25 DIAGNOSIS — N138 Other obstructive and reflux uropathy: Secondary | ICD-10-CM

## 2018-01-25 DIAGNOSIS — N401 Enlarged prostate with lower urinary tract symptoms: Secondary | ICD-10-CM

## 2018-01-25 DIAGNOSIS — Z87898 Personal history of other specified conditions: Secondary | ICD-10-CM

## 2018-03-23 DIAGNOSIS — E118 Type 2 diabetes mellitus with unspecified complications: Secondary | ICD-10-CM | POA: Insufficient documentation

## 2018-05-03 MED ORDER — ADALIMUMAB 40 MG/0.8 ML SUBCUTANEOUS PEN KIT
SUBCUTANEOUS | 3 refills | 0 days | Status: CP
Start: 2018-05-03 — End: 2019-02-01

## 2018-05-03 NOTE — Unmapped (Signed)
offer patient appt on 1010 at 1245pm

## 2018-05-17 ENCOUNTER — Encounter: Admit: 2018-05-17 | Discharge: 2018-05-17 | Payer: MEDICARE

## 2018-05-17 ENCOUNTER — Ambulatory Visit: Admit: 2018-05-17 | Discharge: 2018-05-17 | Payer: MEDICARE

## 2018-05-17 DIAGNOSIS — M1612 Unilateral primary osteoarthritis, left hip: Secondary | ICD-10-CM

## 2018-05-17 DIAGNOSIS — L4050 Arthropathic psoriasis, unspecified: Principal | ICD-10-CM

## 2018-05-17 DIAGNOSIS — M720 Palmar fascial fibromatosis [Dupuytren]: Secondary | ICD-10-CM

## 2018-05-17 DIAGNOSIS — Z79899 Other long term (current) drug therapy: Secondary | ICD-10-CM

## 2018-05-17 DIAGNOSIS — Z7189 Other specified counseling: Secondary | ICD-10-CM

## 2018-05-17 NOTE — Unmapped (Addendum)
Your physician today was Dr. Danella Maiers    Thank you for letting us be involved with your care!    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      1. X-rays today hands and feet.  It can take 10-14 days for all of the test results to come back. I will send you a MyChart message when they are complete.   2. Call dermatology to schedule follow-up - not seen in past year.    3. Flu vaccine: You report already completed last month.   4. Pneumococcal vaccine: Ask your pharmacy to send [place] a copy of your vaccine records so we can ensure you are up to date.   5. I recommend walking or other exercise regularly. Start with 10 minutes a day 5 days/week. Gradually increase by 2-3 minutes per session every few weeks.   6. Please activate your MyChart account if you have not already done so. You can send [place] non-urgent messages and we can send you your test results online.   7. If you have non-urgent questions, the best way to contact us is to send a MyChart message by visiting [email or web address].  You can also use MyChart to request refills and access test results.  I will do my best to respond within 2 business days, however occasionally it may take longer. If you have immediate concerns, please contact our clinic by phone [phone].    8. Next appointment: 1 year       AF Resource Finder:  http://resourcefinder.GamingBus.com.ee  Then enter zip and range, select fitness programs    Arthritis Foundation: PrepaidParty.no    Osteoarthritis Action Alliance:  http://oaaction.PodPark.tn    CDC: http://oaaction.PodPark.tn       Osteoarthritis (Information from the [employer] of Rheumatology)    Fast Facts   ?? Though some of the joint changes are irreversible, most patients will not need joint replacement surgery.  ?? OA symptoms (what you feel) can vary greatly among patients.  ?? A rheumatologist can detect arthritis and prescribe the proper treatment. The goal of treatment in OA is to reduce pain and improve function.  ?? Exercise is an important part of OA treatment, because it can decrease joint pain and improve function.  ?? At present, there is no treatment that can reverse the damage of OA in the joints. Researchers are trying to find ways to slow or reverse this joint damage.    Osteoarthritis (also known as OA) is a common joint disease that most often affects middle-age to elderly people. It is commonly referred to as wear and tear of the joints, but we now know that OA is a disease of the entire joint, involving the cartilage, joint lining, ligaments, and bone.  Although it is more common in older people, it is not really accurate to say that the joints are just ???wearing out.??? It is characterized by breakdown of the cartilage (the tissue that cushions the ends of the bones between joints), bony changes of the joints, deterioration of tendons and ligaments, and various degrees of inflammation of the joint lining (called the synovium).    This arthritis tends to occur in the hand joints, spine, hips, knees, and great toes. The lifetime risk of developing OA  of the knee is about 46%, and the lifetime risk of developing OA of the hip is 25%, according to the [facility], a long-term study from the [employer] [place] and sponsored by [employer] for [employer] and Prevention (often called the [employer]) and the [employer].    OA is a top cause of disability in older people. The goal of osteoarthritis treatment is to reduce pain and improve function. There is no cure for the disease, but some treatments attempt to slow disease progression.    What is osteoarthritis?    OA is a frequently slowly progressive joint disease typically seen in middle-aged to elderly people.  In osteoarthritis, the cartilage between the bones in the joint breaks down. This causes the affected bones to slowly get bigger. The joint cartilage often breaks down because of mechanical stress or biochemical changes within the body, causing the bone underneath to fail. OA can occur together with other types of arthritis, such as gout or rheumatoid arthritis.    OA tends to affect commonly used joints such as the hands and spine, and the weight-bearing joints such as the hips and knees. Symptoms include:  ?? Joint pain and stiffness  ?? Knobby swelling at the joint  ?? Cracking or grinding noise with joint movement  ?? Decreased function of the joint    Who gets osteoarthritis?    OA affects people of all races and both sexes. Most often, it occurs in patients age 82 and above. However, it can occur sooner if you have other risk factors (things that raise the risk of getting OA).  Risk factors include:  ?? Older age  ?? Having family members with OA  ?? Obesity  ?? Previous traumatic Joint injury or repetitive use (overuse) of joints  ?? Joint deformity such as unequal leg length, bowlegs or knocked knees    How is osteoarthritis diagnosed?    Rheumatologists are doctors who are experts in diagnosing and treating arthritis and other diseases of the joints, muscles and bones. You may also need to see other health care providers, for instance, physical or occupational therapists and orthopedic doctors. Most often doctors detect OA based on the typical symptoms (described earlier) and on results of the physical exam. In some cases, X-rays or other imaging tests may be useful to tell the extent of disease or to help rule out other joint problems.    How do you treat osteoarthritis?    There is no proven treatment yet that can reverse joint damage from OA. The goal of osteoarthritis treatment is to reduce pain and improve function of the affected joints. Most often, this is possible with a mixture of physical measures and drug therapy and, sometimes, surgery.    Physical measures: Weight loss and exercise are useful in OA. Excess weight puts stress on your knee joints and hips and low back. For every 10 pounds of weight you lose over 10 years, you can reduce the chance of developing knee OA by up to 50 percent. Exercise can improve your muscle strength, decrease joint pain and stiffness, and lower the chance of disability due to OA. Also helpful are support (???assistive???) devices, such as orthotics or a walking cane, that help you do daily activities. Heat or cold therapy can help relieve OA symptoms for a short time.    Certain alternative treatments such as spa (hot tub), massage, and chiropractic manipulation can help relieve pain for a short time.  They can be costly, though, and require repeated treatments. Also, the long-term benefits of these alternative (sometimes called complementary or integrative) medicine treatments are unproven but are under study.    Drug therapy: Forms of drug therapy include topical, oral (by mouth) and injections (shots). You apply topical drugs directly on the skin over the affected joints. These medicines include capsaicin cream, lidocaine and diclofenac gel. Oral pain relievers such as acetaminophen are common first treatments. So are nonsteroidal anti-inflammatory drugs (often called NSAIDs), which decrease swelling and pain.    In 2010, the government (FDA) approved the use of duloxetine (Cymbalta) for chronic (long-term) musculoskeletal pain including from OA. This oral drug is not new. It also is in use for other health concerns, such as mood disorders, nerve pain and fibromyalgia.    Patients with more serious pain may need stronger medications, such as prescription narcotics.    Joint injections with corticosteroids (sometimes called cortisone shots) or with a form of lubricant called hyaluronic acid can give months of pain relief from OA. This lubricant is given in the knee, and these shots may help delay the need for a knee replacement by a few years in some patients.    Surgery: Surgical treatment becomes an option for severe cases. This includes when the joint has serious damage, or when medical treatment fails to relieve pain and you have major loss of function. Surgery may involve arthroscopy, repair of the joint done through small incisions (cuts). If the joint damage cannot be repaired, you may need a joint replacement.    Supplements: Many over-the-counter nutrition supplements have been used for osteoarthritis treatment. Most lack good research data to support their effectiveness and safety. Among the most widely used are calcium, vitamin D and omega-3 fatty acids. To ensure safety and avoid drug interactions, consult your doctor or pharmacist before using any of these supplements. This is especially true when you are combining these supplements with prescribed drugs.    Living with osteoarthritis    There is no cure for OA, but you can manage how it affects your lifestyle. Some tips include:  ?? Properly position and support your neck and back while sitting or sleeping.  ?? Adjust furniture, such as raising a chair or toilet seat.  ?? Avoid repeated motions of the joint, especially frequent bending.  ?? Lose weight if you are overweight or obese, which can reduce pain and slow progression of OA.  ?? Exercise each day.  ?? Use adaptive devices that will help you do daily activities.  ?? You might want to work with a physical therapist or occupational therapist to learn the best exercises and to choose arthritis assistive devices.    For additional information on osteoarthritis, you may want to visit the Arthritis Foundation???s website: www.https://wilson-anderson.com/.    Updated March 2017 by Nita Sickle and reviewed by the [employer] of Rheumatology Committee on Oceanographer.    This information is provided for general education only. Individuals should consult a qualified health care provider for professional medical advice, diagnosis and treatment of a medical or health condition.

## 2018-05-17 NOTE — Unmapped (Signed)
Last clinic visit: Nov 2017 with Dr. Renford Dills.      Accompanied by: Vincent Forbes who contributed to key portions of the history.     Reason for visit: Follow-up of psoriatic arthritis     HPI: Vincent Forbes is a 77 y.o. year old male with a history of psoriasis (diagnosed 2011) and psoriatic arthritis (diagnosed and started on Humira 12/2015) who presents for follow-up.  In brief summary, first seen in 12/24/15 at which time he was having significant pain and stiffness in left knee. Months before he was having pain and swelling in Vincent left big toe which then migrated to right big toe. Also had morning stiffness of 10-15 minutes. X-rays of hands and feet with possible new bone proliferation consistent with psoriatic arthritis. He was not a candidate for MTX given Vincent history of elevated liver enzymes and elevated AST at the time. He was subsequently started on Humira.  At last appt in Nov 2017 he felt 60% improved in Vincent arthritis symptoms after starting Humira with, continued to have psoriasis.      Interval History:   - Continues humira every 2 weeks. Feels 99% better.  - Feels good except for some pain in Vincent L hip, known L hip OA, worse with steps, catches when turns wrong, sometimes hurts to lie on side in bed.   - Also complains of occasional swelling in Vincent L knee when in bed, better in the morning. Not painful or red, is warm.   - Sometimes has cramps in L wrist when riding motorcycle.  - Forbes notes back pain worse recently, has had back problems since 1997, pain intermittent in lower back. Achy pain, sometimes hurts more with moving. Has had prior GI bleed so avoids NSAIDs. Sometimes hurts in the morning, better after 20-30 mins, sometimes has aching later in day after sitting. No radiation to legs.   - No AM stiffness.   - He reports psoriasis good, some on R leg that he uses cream when acts up for a few days.   - Last derm appt with Dr. Linton Rump Oct 2018 noted improvement in psoriasis, residual skin diease ~4% improved from >30% BSA.  Recommended cont humira, start betamethasone until psoriasis smooth.   - No recent infections or hospitalization. Had dog bite about a month ago, did not require sutures. Had cholecystectomy April 2019.      Record review:  I have reviewed the patient's allergies, medications, pertinent past medical, surgical, social and family history and have updated in Epic where appropriate. I have also reviewed the pertinent records including notes, labs, and imaging tests in the medical record and Care Everywhere.     Review of Systems: All other systems reviewed were negative except as noted above.    Objective    Physical Exam:  Vitals:    05/17/18 0941   BP: 140/64   BP Site: R Arm   BP Position: Sitting   BP Cuff Size: Medium   Pulse: 65   Temp: 36.2 ??C (97.2 ??F)   TempSrc: Oral   Weight: 81.7 kg (180 lb 1.9 oz)     Body mass index is 29.07 kg/m??.  GENERAL: The patient is well appearing, in no acute distress. Ambulates around exam room and climbs on exam table without difficulty.  SKIN:  No psoriatic plaques or nail pitting.  Except: psoriatic skin rash R lower leg.   EYES: EOMI, PERRL. Sclera anicteric conjunctiva non-injected.   ENT: mucus membranes moist without pharyngeal exudates  or ulcers.   Neck: supple, no cervical lymphadenopathy, no thyromegaly  Respiratory: Breathing non-labored, CTA bilaterally, no wheezing, crackles, or rhonchi  CV: Heart rate regular, no murmurs  GI: Abdomen soft, nontender, nondistended, no hepatosplenomegaly  VASCULAR: warm and well perfused extremities, no c/c/e.  NEURO: CN 2-12 grossly intact.   throughout.   PSYCH: No depression or anxiety. Cooperative. Alert and oriented.   MUSCULOSKELETAL:   ?? Neck: Restricted  extension and lateral rotation (hx of cervical fusion)  ?? Bilateral shoulders, elbows, wrists, hands, fingers:  No deformity, erythema, warmth, swelling, effusion, tenderness, or limited ROM.?? Grip strength good. Able to curl all fingers. Except: Flexion contracture R 5th and L 3-5 fingers. See photo of prayer sign below. Heberden's nodules bilateral hands.   ?? Spine nontender to palpation.   ?? Hips without limited ROM.???? Except: Significant pain with L hip IR in groin and lateral region.   ?? Bilateral knees, ankles, feet, toes: No deformity, erythema, warmth, swelling, effusion, tenderness, or limited ROM. MTP squeeze negative.  Except: Crepitus bilat knees.           Labs:  [facility]:  03/15/2018   5.2>14.8/43.1<189.  ALC 1.6, ANC 2.9.  BUN 9, Cr 0.9, AST 23, ALT 19    12/24/2015 XR Bilat hands: Multiple boutonniere deformities.  Possible new bone proliferation along the medial surfaces of the right triquetrum and pisiform.    12/24/2015 XR Bilat feet: Abnormal interphalangeal joints at the 3rd and 4th toes on the right and 2nd, 3rd and 4th toes on the left.  These features may reflect an underlying seronegative, productive arthropathy such as psoriatic arthritis or reactive arthritis.     12/24/2015 XR SI joints:  No evidence for inflammatory sacroiliac joint arthropathy.  Left hip osteoarthrosis.    Derm note from 06/02/2017 Dr. Linton Rump: No evidence for inflammatory sacroiliac joint arthropathy.  Left hip osteoarthrosis.    Assessment/Plan:      77 y.o. gentleman with a history of psoriasis (diagnosed 2011) and psoriatic arthritis (diagnosed and started on Humira 12/2015) who presents for follow-up.      Psoriasis and Psoriatic arthritis: Joint symptoms are significant improved, mild psoriasis persists but much improved. Unable to take MTX due to history of elevated liver enzymes. Uses topical intermittently.   - Continue humira q2 weeks.   - Will update hand and foot XR to evaluate for progression.   - Continue topical treatment for psoriasis as recommended by dermatology  - Asked pt to call to schedule f/u with dermatology.     Concern for Dupuytren's contracture. I think Vincent hand changes are more likely contracture from diabetes than from PsA.    - He is not interested in any treatment, does not want referral to OT or hand specialist.   - Encouraged good DM control.     L Hip OA. We discussed risk factors for OA including genetics, overweight, prior joint injuries, etc.  We reviewed that there are currently no disease modifying medications for OA and that it is not possible to halt progression at this point.  The most effective interventions are lifestyle/behavioral ones including weight loss and regular exercise.   - I provided the patient with several resources, including the [employer], osteoarthritis action alliance, and CDC.   - Pt avoids NSAIDs given hx of stomach ulcers.   - Reviewed that physical therapy can be helpful.  - When pain and activity limitations become severe enough, can refer to ortho to discuss THA.  Immunization counseling:   - Flu vaccine: Pt reports already had this fall.  - Prevnar PCV-13: Asked pt to have vaccine records from pharmacy faxed to clinic for me to review.   - Pneumovax PPSV-23: Feb 2011.     High risk medication monitoring: Humira  - Patient is currently taking humira, an immunosuppressant medication that requires intensive monitoring. This monitoring was done today through history, physical, and/or lab testing.   - No recent infections or hospitalizations.   - Reviewed CBC from Aug 2019 in Care Everywhere.  Will check CBC annualy.  - Patient had a quant gold in Nov 2018 that was neg.  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 Does not require repeat quant gold unless he has new risk factors for TB exposure. TB risk factors were reviewed: 05/17/2018       Follow-up: 1 year    We appreciate the opportunity to participate in the care of this patient. Total duration of the visit was 30 minutes, with more than 50% of the visit spent in face-to-face counseling focusing on diagnosis and therapeutic options and coordinating care.     Danella Maiers, MD, MSCI  Assistant Professor of Medicine  Department of Medicine/Division of Rheumatology  [place] [facility] [place] at [facility]  [phone] clinic phone  [phone] clinic secure fax

## 2018-05-18 NOTE — Unmapped (Signed)
I reviewed your hand x-rays and I think that left second MCP joint (base of your index finger) looks the same.      Dr. Sullivan Lone

## 2018-05-18 NOTE — Unmapped (Signed)
Radiologist thinks your foot x-rays look a bit worse as well.  If you are interested in adjusting medicines we can talk about other options.  I can also discuss them more with our radiologist later this month or you can get an ultrasound to see if there is inflammation that would indicate your arthritis is currently active.  Please let me know how you would like to proceed.    Dr. Sullivan Lone

## 2018-07-06 ENCOUNTER — Ambulatory Visit: Payer: Medicare Other | Admitting: Cardiovascular Disease

## 2018-07-06 ENCOUNTER — Encounter: Payer: Self-pay | Admitting: Cardiovascular Disease

## 2018-07-06 VITALS — BP 146/78 | HR 61 | Ht 67.0 in | Wt 186.0 lb

## 2018-07-06 DIAGNOSIS — R079 Chest pain, unspecified: Secondary | ICD-10-CM | POA: Diagnosis not present

## 2018-07-06 NOTE — Patient Instructions (Signed)

## 2018-07-06 NOTE — Progress Notes (Signed)
Cardiology Office Note  Date:  07/06/2018   ID:  Brian Callahan, [name] 1940/12/03, MRN [id number]  PCP:  Brian Crouch, MD   Chief Complaint  Patient presents with  . New Patient (Initial Visit)    chest pain referred by Dr. Stefano Callahan".Medications reviewed verbally.    HPI:  [name] is a 77 year old gentleman with past medical history of former smoker, quit 25 years ago Abnormal stress tests, Chest pain Prior GI bleed requiring hospitalization, mesenteric artery embolization Gastritis, GERD shows normal sinus rhythm with rate 72 bpm no significant ST or T wave changes Shortness of breath on exertion Hyperlipidemia, on zetia Heavy drinker in the past Prior  Cardiac cath 2010 no significant coronary disease Who presents by referral from Dr. Georgie Callahan for consultation of his chest pain  He reports having recent episodes of chest twinges These are rare Thinks he might be just "paranoid", about the chest twinges "has not happened in a while" Twinges only at rest Family member had cardiac issue, now he is worried Otherwise he is active, good exercise tolerance, no chest discomfort on exertion  Labs reviewed HAB1C 6.6, down  From 7.4  On zetia  Prior heart cath 2010 or 2011 "95% open in all your arteries"  Carotid u/s Minimal to moderate amount of bilateral atherosclerotic plaque, right greater than left, not resulting in a hemodynamically significant stenosis within either internal carotid artery.  EKG personally reviewed by myself on todays visit Shows normal sinus rhythm rate 61 bpm no significant ST or T wave changes   Previously seen by cardiology 09/28/2017 at [facility]  2D echocardiogram 07/15/2017 revealed LVEF of 50% with moderate aortic insufficiency.   [place] 07/15/2017 revealed LV ejection fraction of 63% with mild inferior wall ischemia. "Mild perfusion abnormality of mild intensity"  10/18/17 had gall bladder out, Treated by Dr.  Tiffany Callahan   PMH:   has a past medical history of Anemia, BP (high blood pressure) (05/23/2015), BPH with obstruction/lower urinary tract symptoms, COPD (chronic obstructive pulmonary disease) ([place]), Dyspnea, Encounter for long-term (current) use of other high-risk medications, GERD (gastroesophageal reflux disease), GI bleed, Gout, H/O hernia repair (mid-90s), History of elevated PSA, History of [facility] spotted fever, HLD (hyperlipidemia), HTN (hypertension), Hyperglycemia, Hyperuricemia, Incomplete bladder emptying, Obesity, Psoriasis, Psoriasis, and Urinary frequency.  PSH:    Past Surgical History:  Procedure Laterality Date  . APPENDECTOMY    . BACK SURGERY     neck and spine 2007  . CERVICAL SPINE SURGERY  2007  . CHOLECYSTECTOMY N/A 10/18/2017   Procedure: LAPAROSCOPIC CHOLECYSTECTOMY;  Surgeon: Brian Pun, MD;  Location: [facility] ORS;  Service: General;  Laterality: N/A;  . COLECTOMY  1998   partial, due to obstruction  . COLONOSCOPY     lost 10 units of blood  . ESOPHAGOGASTRODUODENOSCOPY N/A 10/14/2016   Procedure: ESOPHAGOGASTRODUODENOSCOPY (EGD);  Surgeon: [name], MD;  Location: [facility] ENDOSCOPY;  Service: Endoscopy;  Laterality: N/A;  . EYE SURGERY     bilateral cataracts  . HERNIA REPAIR  mid-90s   x2  inguinal  . PROSTATE BIOPSY  2013  . SURGERY SCROTAL / TESTICULAR     for fibrous growth    Current Outpatient Medications  Medication Sig Dispense Refill  . acetaminophen (TYLENOL) 500 MG tablet Take 500-1,000 mg by mouth every 6 (six) hours as needed (for headache/minor pain.).    . Adalimumab 40 MG/0.8ML PNKT Inject 40 mg into the skin every 14 (fourteen) days. Thursdays    .  albuterol (PROVENTIL HFA;VENTOLIN HFA) 108 (90 Base) MCG/ACT inhaler Inhale 1-2 puffs into the lungs every 6 (six) hours as needed for wheezing or shortness of breath.     . betamethasone dipropionate (DIPROLENE) 0.05 % cream Apply 1 application topically 2 (two) times daily as  needed (for psoriasis).    . enalapril (VASOTEC) 5 MG tablet Take 5 mg daily by mouth.     . ezetimibe (ZETIA) 10 MG tablet Take 10 mg by mouth daily.    . finasteride (PROSCAR) 5 MG tablet TAKE 1 TABLET BY MOUTH  DAILY (Patient taking differently: TAKE 1 TABLET (5 MG)  BY MOUTH  DAILY) 90 tablet 3  . Fluticasone-Salmeterol (ADVAIR DISKUS) 250-50 MCG/DOSE AEPB Inhale 1 puff 2 (two) times daily into the lungs.     . isosorbide mononitrate (IMDUR) 30 MG 24 hr tablet Take 30 mg by mouth daily.    . metoprolol succinate (TOPROL-XL) 50 MG 24 hr tablet Take 50 mg by mouth daily. Take with or immediately following a meal.    . Misc Natural Products (TART CHERRY ADVANCED PO) Take 12 mg by mouth daily.     . Multiple Vitamins-Minerals (PRESERVISION AREDS 2 PO) Take 2 capsules by mouth daily.     Marland Kitchen omeprazole (PRILOSEC) 40 MG capsule Take 40 mg by mouth daily before breakfast.     . tamsulosin (FLOMAX) 0.4 MG CAPS capsule TAKE 1 CAPSULE BY MOUTH  DAILY (Patient taking differently: TAKE 1 CAPSULE (0.4 MG) BY MOUTH  DAILY) 90 capsule 3   No current facility-administered medications for this visit.      Allergies:   Shellfish allergy   Social History:  The patient  reports that he quit smoking about 27 years ago. He has never used smokeless tobacco. He reports that he drinks alcohol. He reports that he does not use drugs.   Family History:   family history includes Kidney failure in his brother.    Review of Systems: Review of Systems  Constitutional: Negative.   Respiratory: Negative.   Cardiovascular:       [place]  Gastrointestinal: Negative.   Musculoskeletal: Negative.   Neurological: Negative.   Psychiatric/Behavioral: Negative.   All other systems reviewed and are negative.    PHYSICAL EXAM: VS:  BP (!) 146/78 (BP Location: Right Arm, Patient Position: Sitting, Cuff Size: Normal)   Pulse 61   Ht 5\' 7"  (1.702 m)   Wt 186 lb (84.4 kg)   BMI 29.13 kg/m  , BMI Body mass index is  29.13 kg/m. Constitutional:  oriented to person, place, and time. No distress.  HENT:  Head: Grossly normal Eyes:  no discharge. No scleral icterus.  Neck: No JVD, no carotid bruits  Cardiovascular: Regular rate and rhythm, no murmurs appreciated Pulmonary/Chest: Clear to auscultation bilaterally, no wheezes or rails Abdominal: Soft.  no distension.  no tenderness.  Musculoskeletal: Normal range of motion Neurological:  normal muscle tone. Coordination normal. No atrophy Skin: Skin warm and dry Psychiatric: normal affect, pleasant   Recent Labs: No results found for requested labs within last 8760 hours.    Lipid Panel No results found for: CHOL, HDL, LDLCALC, TRIG    Wt Readings from Last 3 Encounters:  07/06/18 186 lb (84.4 kg)  01/25/18 180 lb 14.4 oz (82.1 kg)  11/30/17 175 lb 8 oz (79.6 kg)       ASSESSMENT AND PLAN:  Chest pain Atypical in nature, describes them as a twinge at rest  Otherwise has no symptoms with  exertion, good exercise tolerance Previous stress test Very small region mild perfusion defect Medical management at this time  Gastrointestinal hemorrhage, unspecified gastrointestinal hemorrhage type  2 GI bleeds in the past Denies any further bleeding  Essential hypertension Blood pressure is well controlled on today's visit. No changes made to the medications.  Mixed hyperlipidemia On zetia May need a statin to achieve LDL <70 based on carotid plaque Will defer to primary care  Anemia, unspecified type  Managed by primary care  Disposition:   F/U 1 year   Total encounter time more than 25 minutes  Greater than 50% was spent in counseling and coordination of care with the patient   Orders Placed This Encounter  Procedures  . EKG 12-Lead     Signed, Esmond Plants, M.D., Ph.D. 07/06/2018  [place], [place]

## 2018-07-22 ENCOUNTER — Other Ambulatory Visit: Payer: Self-pay

## 2018-07-22 DIAGNOSIS — N138 Other obstructive and reflux uropathy: Secondary | ICD-10-CM

## 2018-07-22 DIAGNOSIS — N401 Enlarged prostate with lower urinary tract symptoms: Principal | ICD-10-CM

## 2018-07-28 ENCOUNTER — Other Ambulatory Visit: Payer: Medicare Other

## 2018-07-28 DIAGNOSIS — N401 Enlarged prostate with lower urinary tract symptoms: Principal | ICD-10-CM

## 2018-07-28 DIAGNOSIS — N138 Other obstructive and reflux uropathy: Secondary | ICD-10-CM

## 2018-07-29 LAB — PSA: Prostate Specific Ag, Serum: 2 ng/mL (ref 0.0–4.0)

## 2018-08-01 ENCOUNTER — Ambulatory Visit: Payer: Medicare Other | Admitting: Urology

## 2018-08-18 ENCOUNTER — Encounter: Payer: Self-pay | Admitting: Urology

## 2018-08-18 ENCOUNTER — Ambulatory Visit: Payer: Medicare Other | Admitting: Urology

## 2018-08-18 VITALS — BP 160/75 | HR 83 | Resp 14 | Ht 66.0 in | Wt 186.0 lb

## 2018-08-18 DIAGNOSIS — Z87898 Personal history of other specified conditions: Secondary | ICD-10-CM | POA: Diagnosis not present

## 2018-08-18 DIAGNOSIS — N401 Enlarged prostate with lower urinary tract symptoms: Secondary | ICD-10-CM | POA: Diagnosis not present

## 2018-08-18 DIAGNOSIS — N138 Other obstructive and reflux uropathy: Secondary | ICD-10-CM

## 2018-08-18 NOTE — Progress Notes (Signed)
08/18/2018 3:40 PM   [name] 1941/05/20 [id number]  Referring provider: Idelle Crouch, MD [facility] [place],  [address]  Chief Complaint  Patient presents with  . Follow-up    HPI: Patient is 78 year old Caucasian male with BPH and LUTS and a history of elevated PSA that is managed with tamsulosin and finasteride presents today for a 6 month follow up.  History of elevated PSA PSA Trend  bx in 2013 negative - unknown PSA level  4.66 in 2015 - started on finasteride  2.6 (5.2) ng/mL on 04/25/2014  1.6 (3.2) ng/mL on 11/22/2014  1.8 (3.9) ng/mL on 05/23/2015  2.3 (4.6) ng/mL on 11/18/2015  2.0 (2.0) ng/mL on 03/10/2016  2.3 (4.6) ng/mL on 11/19/2016  2.5 (5.0) ng/mL on 06/01/2017  2.2 (4.4) ng/mL on 12/2017  2.0 ng/mL on 07/28/2018  BPH WITH LUTS His IPSS score today is 2/1, which is mild lower urinary tract symptomatology.  He is pleased with his quality life due to his urinary symptoms.  His previous IPSS score was 4. His previous PVR was 125 mL. Patient denies any gross hematuria, dysuria or suprapubic/flank pain.  Patient denies any fevers, chills, nausea or vomiting. He is currently taking finasteride 5 mg and tamsulosin 0.4 mg daily. He had a biopsy in 2013 for which was negative. He does not have a family history of PCa.  IPSS    Row Name 08/18/18 1500         International Prostate Symptom Score   How often have you had the sensation of not emptying your bladder?  Not at All     How often have you had to urinate less than every two hours?  Less than 1 in 5 times     How often have you found you stopped and started again several times when you urinated?  Not at All     How often have you found it difficult to postpone urination?  Not at All     How often have you had a weak urinary stream?  Not at All     How often have you had to strain to start urination?  Not at All     How many times did you typically get up at night  to urinate?  1 Time     Total IPSS Score  2       Quality of Life due to urinary symptoms   If you were to spend the rest of your life with your urinary condition just the way it is now how would you feel about that?  Pleased        Score:  1-7 Mild 8-19 Moderate 20-35 Severe  PMH: Past Medical History:  Diagnosis Date  . Anemia   . BP (high blood pressure) 05/23/2015  . BPH with obstruction/lower urinary tract symptoms   . COPD (chronic obstructive pulmonary disease) ([place])   . Dyspnea   . Encounter for long-term (current) use of other high-risk medications   . GERD (gastroesophageal reflux disease)   . GI bleed   . Gout   . H/O hernia repair mid-90s   x2  . History of elevated PSA   . History of [facility] spotted fever   . HLD (hyperlipidemia)   . HTN (hypertension)   . Hyperglycemia   . Hyperuricemia   . Incomplete bladder emptying   . Obesity   . Psoriasis   . Psoriasis   . Urinary frequency  Surgical History: Past Surgical History:  Procedure Laterality Date  . APPENDECTOMY    . BACK SURGERY     neck and spine 2007  . CERVICAL SPINE SURGERY  2007  . CHOLECYSTECTOMY N/A 10/18/2017   Procedure: LAPAROSCOPIC CHOLECYSTECTOMY;  Surgeon: [name], MD;  Location: [facility] ORS;  Service: General;  Laterality: N/A;  . COLECTOMY  1998   partial, due to obstruction  . COLONOSCOPY     lost 10 units of blood  . ESOPHAGOGASTRODUODENOSCOPY N/A 10/14/2016   Procedure: ESOPHAGOGASTRODUODENOSCOPY (EGD);  Surgeon: [name], MD;  Location: [facility] ENDOSCOPY;  Service: Endoscopy;  Laterality: N/A;  . EYE SURGERY     bilateral cataracts  . HERNIA REPAIR  mid-90s   x2  inguinal  . PROSTATE BIOPSY  2013  . SURGERY SCROTAL / TESTICULAR     for fibrous growth    Home Medications:  Allergies as of 08/18/2018      Reactions   Shellfish Allergy       Medication List       Accurate as of August 18, 2018  3:40 PM. Always use your most recent med list.          acetaminophen 500 MG tablet Commonly known as:  TYLENOL Take 500-1,000 mg by mouth every 6 (six) hours as needed (for headache/minor pain.).   Adalimumab 40 MG/0.8ML Pnkt Inject 40 mg into the skin every 14 (fourteen) days. Thursdays   ADVAIR DISKUS 250-50 MCG/DOSE Aepb Generic drug:  Fluticasone-Salmeterol Inhale 1 puff 2 (two) times daily into the lungs.   albuterol 108 (90 Base) MCG/ACT inhaler Commonly known as:  PROVENTIL HFA;VENTOLIN HFA Inhale 1-2 puffs into the lungs every 6 (six) hours as needed for wheezing or shortness of breath.   betamethasone dipropionate 0.05 % cream Commonly known as:  DIPROLENE Apply 1 application topically 2 (two) times daily as needed (for psoriasis).   enalapril 5 MG tablet Commonly known as:  VASOTEC Take 5 mg daily by mouth.   ezetimibe 10 MG tablet Commonly known as:  ZETIA Take 10 mg by mouth daily.   finasteride 5 MG tablet Commonly known as:  PROSCAR TAKE 1 TABLET BY MOUTH  DAILY   isosorbide mononitrate 30 MG 24 hr tablet Commonly known as:  IMDUR Take 30 mg by mouth daily.   metoprolol succinate 50 MG 24 hr tablet Commonly known as:  TOPROL-XL Take 50 mg by mouth daily. Take with or immediately following a meal.   omeprazole 20 MG capsule Commonly known as:  PRILOSEC Take 20 mg by mouth daily.   PRESERVISION AREDS 2 PO Take 2 capsules by mouth daily.   tamsulosin 0.4 MG Caps capsule Commonly known as:  FLOMAX TAKE 1 CAPSULE BY MOUTH  DAILY   TART CHERRY ADVANCED PO Take 12 mg by mouth daily.       Allergies:  Allergies  Allergen Reactions  . Shellfish Allergy     Family History: Family History  Problem Relation Age of Onset  . Kidney failure Brother   . Prostate cancer Neg Hx     Social History:  reports that he quit smoking about 27 years ago. He has never used smokeless tobacco. He reports current alcohol use. He reports that he does not use drugs.  ROS: UROLOGY Frequent Urination?:  No Hard to postpone urination?: No Burning/pain with urination?: No Get up at night to urinate?: No Leakage of urine?: No Urine stream starts and stops?: No Trouble starting stream?: No Do you have  to strain to urinate?: No Blood in urine?: No Urinary tract infection?: No Sexually transmitted disease?: No Injury to kidneys or bladder?: No Painful intercourse?: No Weak stream?: No Erection problems?: No Penile pain?: No  Gastrointestinal Nausea?: No Vomiting?: No Indigestion/heartburn?: No Diarrhea?: No Constipation?: No  Constitutional Fever: No Night sweats?: No Weight loss?: No Fatigue?: No  Skin Skin rash/lesions?: No Itching?: No  Eyes Blurred vision?: No Double vision?: No  Ears/Nose/Throat Sore throat?: No Sinus problems?: No  Hematologic/Lymphatic Swollen glands?: No Easy bruising?: No  Cardiovascular Leg swelling?: No Chest pain?: No  Respiratory Cough?: No Shortness of breath?: No  Endocrine Excessive thirst?: No  Musculoskeletal Back pain?: No Joint pain?: No  Neurological Headaches?: No Dizziness?: No  Psychologic Depression?: No Anxiety?: No  Physical Exam: BP (!) 160/75   Pulse 83   Resp 14   Ht 5\' 6"  (1.676 m)   Wt 186 lb (84.4 kg)   BMI 30.02 kg/m   Constitutional:  Well nourished. Alert and oriented, No acute distress. HEENT: [place] AT, moist mucus membranes.  Trachea midline, no masses. Cardiovascular: No clubbing, cyanosis, or edema. Respiratory: Normal respiratory effort, no increased work of breathing. GU: No CVA tenderness.  No bladder fullness or masses.  Patient with uncircumcised phallus. Foreskin easily retracted. Urethral meatus is patent.  No penile discharge. No penile lesions or rashes. Scrotum without lesions, cysts, rashes and/or edema.  Testicles are located scrotally bilaterally. No masses are appreciated in the testicles. Left and right epididymis are normal. Rectal: Patient with normal sphincter tone.  Anus and perineum without scarring or rashes. No rectal masses are appreciated. Prostate is approximately 35 grams, no nodules are appreciated. Seminal vesicles are normal. Skin: No rashes, bruises or suspicious lesions. Neurologic: Grossly intact, no focal deficits, moving all 4 extremities. Psychiatric: Normal mood and affect.  Laboratory Data: See HPI    1. BPH with LUTS  - IPSS score is 2/1, improved  - Continue conservative management, avoiding bladder irritants and timed voiding's  - Continue tamsulosin 0.4 mg and finasteride 5 mg daily; refills not needed at this time  - RTC in 6 months for IPSS, PSA and exam   2. History of elevated PSA  - Current PSA is 2.0, stable   - Return to clinic in 6 months for PSA and exam  Return in about 6 months (around 02/16/2019) for PSA/IPSS/exam.  Zara Council, PA-C  [place], [place] [address] [phone]  I, Lucas Mallow, am acting as a Education administrator for [employer],  PSA to be drawn before appointment

## 2018-09-09 NOTE — Unmapped (Signed)
Patient need to be seen for more medication

## 2018-09-23 NOTE — Unmapped (Signed)
Pt hasn't been seen since 2018. He needs to make an appointment before this medication can be refilled.

## 2018-10-16 ENCOUNTER — Other Ambulatory Visit: Payer: Self-pay | Admitting: Urology

## 2018-10-16 DIAGNOSIS — N400 Enlarged prostate without lower urinary tract symptoms: Secondary | ICD-10-CM

## 2018-11-08 NOTE — Unmapped (Signed)
Attempt to reach patient on BOTH phone numbers. I was able to leave a message on Vincent Forbes's phone [phone].     A letter was sent to patient

## 2018-11-10 NOTE — Unmapped (Signed)
2nd attempt to contact patient,LMG on Judy's phone [phone], called [phone] twice and someone pickup and hung up both times.

## 2018-12-23 NOTE — Unmapped (Signed)
Per recall , attempt to reach patient to reach appointment.[facility] [place] office opening

## 2019-02-01 ENCOUNTER — Encounter: Admit: 2019-02-01 | Discharge: 2019-02-02 | Payer: MEDICARE

## 2019-02-01 DIAGNOSIS — L4050 Arthropathic psoriasis, unspecified: Secondary | ICD-10-CM

## 2019-02-01 DIAGNOSIS — Z79899 Other long term (current) drug therapy: Principal | ICD-10-CM

## 2019-02-01 DIAGNOSIS — L409 Psoriasis, unspecified: Secondary | ICD-10-CM

## 2019-02-01 MED ORDER — ADALIMUMAB 40 MG/0.8 ML SUBCUTANEOUS PEN KIT
SUBCUTANEOUS | 3 refills | 0 days | Status: CP
Start: 2019-02-01 — End: ?

## 2019-02-01 MED ORDER — CLOBETASOL 0.05 % TOPICAL OINTMENT
5 refills | 0 days | Status: CP
Start: 2019-02-01 — End: ?

## 2019-02-01 NOTE — Unmapped (Signed)
Dear Vincent Forbes,    It was great to see you again today.  I am going to prescribe you a strong medication called clobetasol that she can use on your legs to try to clear up your psoriasis.  You can use this twice daily as needed.  The other thing to try is to get about 10 minutes of sunlight a day on your legs.  Oftentimes sunlight can reduce psoriasis (just please do not get too much).    If we continue to have issues with your psoriasis we may consider changing your Humira to try a different type of medication but we will hold off for now since it is working so well for your arthritis.    Sincerely,    Rory Percy, MD

## 2019-02-01 NOTE — Unmapped (Signed)
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  Since he feels very good on the Humira and feels that it helps his joints and is also improved his cutaneous psoriasis we will not change this for today.  Instead, we will add a topical medication that I have encouraged him to use twice daily to his lower extremities in hopes that they will clear up through this.  Also recommended 10 to 15 minutes daily of sunlight to the area which can sometimes be helpful for psoriasis as well.  We discussed all risks and benefits as well as appropriate use.  - adalimumab (HUMIRA) 40 mg/0.8 mL subcutaneous pen kit; Inject 1 pen (40 mg total) under the skin every fourteen (14) days.  Dispense: 6 each; Refill: 3  - clobetasoL (TEMOVATE) 0.05 % ointment; Apply topically twice daily as needed for psoriasis.  Dispense: 60 g; Refill: 5    High risk medication use  - Quantiferon TB Gold Plus; Future  - Quantiferon TB Gold Plus    Return to clinic: 3 months or sooner as needed.    CHIEF COMPLAINT:  Follow-up psoriasis, currently flaring on lower extremities    HPI:   This is a pleasant 78 y.o.-year-old who last saw me in October 2018.  At that time we saw him for psoriasis.  At that time he was on Humira and clobetasol as well as betamethasone cream.  He saw a rheumatology in October 2019 for arthritis.  He was felt not to be a candidate for methotrexate given his elevated liver enzymes.  He had had some improvement with his arthritis at that visit.    Today he says his psoriasis is flared on his lower extremities predominantly.  Associated with mild itch.  He feels that this is due to stress including the recent passing of his daughter and granddaughter.    PAST MEDICAL HISTORY:    Hx of psoriasis:  -H/o of psoriasis > 25 years, but flared in 2013 after a steroid injection for gout.   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 Not a candidate for MTX due to EtOH use and transaminitis     MEDICATIONS:   Reviewed in epic    ALLERGIES:   Isosorbide mononitrate and Shellfish containing products    REVIEW OF SYSTEMS:  Baseline state of health. No recent illnesses. No other skin complaints.    PHYSICAL EXAMINATION:  Examination in the presence of male chaperone:  General: Well-developed, well-nourished. No acute distress. Neuro: Alert and oriented, answers questions appropriately.  Skin: Examination of the scalp, face, neck, chest, abdomen, back, bilateral upper extremities, bilateral lower extremities, palms, nails, and soles was performed and notable for the following:  Chest back face scalp clear  Red psoriatic plaques on lower extremities  No obvious nail changes

## 2019-02-13 ENCOUNTER — Other Ambulatory Visit: Payer: Self-pay

## 2019-02-13 ENCOUNTER — Other Ambulatory Visit: Payer: Medicare Other

## 2019-02-13 DIAGNOSIS — R972 Elevated prostate specific antigen [PSA]: Secondary | ICD-10-CM

## 2019-02-15 ENCOUNTER — Ambulatory Visit: Payer: Medicare Other | Admitting: Urology

## 2019-03-31 DIAGNOSIS — L4050 Arthropathic psoriasis, unspecified: Secondary | ICD-10-CM

## 2019-03-31 MED ORDER — HUMIRA PEN CITRATE FREE 40 MG/0.4 ML
SUBCUTANEOUS | 3 refills | 84.00000 days | Status: CP
Start: 2019-03-31 — End: ?

## 2019-03-31 NOTE — Unmapped (Signed)
[facility] RHEUMATOLOGY CLINIC - PHARMACIST NOTES     Humira prescription sent to [facility] at the request of MAP team for renewal of mfg assistance.  Changing script to CF formulation.  Patient has an appt with Dr. Sullivan Lone next month.     Chelsea Aus

## 2019-04-03 DIAGNOSIS — L4050 Arthropathic psoriasis, unspecified: Secondary | ICD-10-CM

## 2019-04-03 NOTE — Unmapped (Signed)
Per test claim for HUMIRA at the [facility], patient needs Medication Assistance Program for High Copay OF [phone].

## 2019-04-12 ENCOUNTER — Other Ambulatory Visit: Payer: Medicare Other

## 2019-04-18 ENCOUNTER — Ambulatory Visit: Payer: Medicare Other | Admitting: Urology

## 2019-05-02 ENCOUNTER — Encounter: Admit: 2019-05-02 | Discharge: 2019-05-03 | Payer: MEDICARE

## 2019-05-02 DIAGNOSIS — Z7189 Other specified counseling: Secondary | ICD-10-CM

## 2019-05-02 DIAGNOSIS — Z79899 Other long term (current) drug therapy: Secondary | ICD-10-CM

## 2019-05-02 DIAGNOSIS — L409 Psoriasis, unspecified: Secondary | ICD-10-CM

## 2019-05-02 DIAGNOSIS — L4050 Arthropathic psoriasis, unspecified: Secondary | ICD-10-CM

## 2019-05-02 NOTE — Unmapped (Signed)
Rheumatology Remote Clinic Follow-up Appointment     Patient self identified using name and date of birth.    Patient location: Home. His wife is also present during encounter and contributed to key portions of the history.     Last clinic visit: Oct 2019     Reason for encounter: Follow-up Psoriatic Arthritis (PsA)     HISTORY: Mr. Vincent Forbes is a 78 y.o. male with a history of psoriasis (diagnosed 2011) and psoriatic arthritis (diagnosed and started on Humira 12/2015) who presents for follow-up.  In brief summary, first seen in 12/24/15 at which time he was having significant pain and stiffness in left knee. Months before he was having pain and swelling in his left big toe which then migrated to right big toe. Also had morning stiffness of 10-15 minutes. X-rays of hands and feet with possible new bone proliferation consistent with psoriatic arthritis. He was not a candidate for MTX given his history of elevated liver enzymes and elevated AST at the time. He was subsequently started on Humira June 2017.    ??  Interval History:   - Everything is pretty good.  - Had psoriasis flare saw derm Dr. Linton Rump July 2020, added topical clobetasol for flare.   - Remains on Humira every 2 weeks.   - More pain L shoulder, L knee, and back. Pain migrates, today L knee was painful. No joint swelling. Not noticed pain getting worse as nears next injection.  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  - Continues to have pain in his L hip, prior diagnosis of OA.   - AM stiffness: sometimes. Stretches in bed, sometimes gets cramp in calf.  - No recent infections or hospitalization. No injection site reactions.   - Most recent Humira shot had some liquid run down leg after removed shot.     Objective    Physical Exam:  GENERAL: The patient does not sound to be in acute distress.   Respiratory: Speaks in full sentences. No cough.   PSYCH: No depression or anxiety. Cooperative. Alert and oriented. Reviewed CBC from 04/04/2019 in Care Everywhere    ASSESSMENT/PLAN:  Mr. Vincent Forbes is a 78 y.o. male with a history of psoriasis (diagnosed 2011) and psoriatic arthritis (diagnosed and started on Humira 12/2015).  Unable to take MTX due to history of elevated liver enzymes. Uses topical intermittently.   ??  Psoriasis and Psoriatic arthritis: Pt reports some increased joint pain today. Discussed with pt and his wife that it is challenging to know if his joint pain is due to increased PsA activity and active synovitis vs alternate etiology such as progressive osteoarthritis (OA) or other mechanical etiology.  - Continue humira q2 weeks.   - Unable to take MTX due to history of elevated liver enzymes.   - Discussed options including increasing Humira to weekly, switching to alternate TNF (eg Enbrel or Cizmia) or to alternate medication such as Cosentyx.  - Will update hand and foot XR to evaluate for progression.   - Continue topical treatment for psoriasis per dermatology.  ??  L Hip OA.   - Pt avoids NSAIDs given hx of stomach ulcers.   - When pain and activity limitations become severe enough, can refer to ortho to discuss THA.   ??  Immunization counseling:   - Flu vaccine: completed Oct 2020   - Prevnar PCV-13: Dec 2018  - Pneumovax PPSV-23: Feb 2011.   ??  High risk medication  monitoring: Humira  - Patient is currently taking humira, an immunosuppressant medication that requires intensive monitoring. This monitoring was done today through history and/or lab testing.   - No recent infections or hospitalizations.   - Reviewed CBC from 04/04/2019 in Care Everywhere.  Recommend CBC annualy.  - Patient had a quant gold in Nov 2018 that was neg.  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 Does not require repeat quant gold unless he has new risk factors for TB exposure. TB risk factors were reviewed: 05/02/2019       Follow-up: 3-4 months with Carlus Pavlov PA-c (in person on Friday), 8-9 months with me    Danella Maiers, MD, [facility]  Assistant Professor of Medicine  Department of Medicine/Division of Rheumatology  [place] [facility]  [phone] clinic phone  [phone] clinic secure fax     I spent 19 minutes on the phone with the patient. I spent an additional 10 minutes on pre- and post-visit activities.     The patient was not located and I was not located within 250 yards of the clinic or medical center during the video or telephone visit. The patient was physically located in [place] or a state in which I am permitted to provide care. The patient and/or parent/guardian understood that s/he may incur co-pays and cost sharing, and agreed to the telemedicine visit. The visit was reasonable and appropriate under the circumstances given the patient's presentation at the time.    The patient and/or parent/guardian has been advised of the potential risks and limitations of this mode of treatment (including, but not limited to, the absence of  in-person examination) and has agreed to be treated using telemedicine. The patient's/patient's family's questions regarding telemedicine have been answered. If the visit was completed in an ambulatory setting, the patient and/or parent/guardian has also been advised to contact their provider's office for worsening conditions, and seek emergency medical treatment and/or call 911 if the patient deems either necessary

## 2019-05-09 ENCOUNTER — Other Ambulatory Visit: Payer: Self-pay

## 2019-05-09 ENCOUNTER — Other Ambulatory Visit: Payer: Medicare Other

## 2019-05-09 DIAGNOSIS — R972 Elevated prostate specific antigen [PSA]: Secondary | ICD-10-CM

## 2019-05-10 LAB — PSA: Prostate Specific Ag, Serum: 2.8 ng/mL (ref 0.0–4.0)

## 2019-05-15 NOTE — Progress Notes (Signed)
08/18/2018 2:16 PM   Brian Callahan Mar 11, 1941 [id number]  Referring provider: Idelle Crouch, MD [place] [facility] [place],  [place] [address]  Chief Complaint  Patient presents with  . Benign Prostatic Hypertrophy    HPI: Patient is 78 year old male with BPH and LUTS and a history of elevated PSA that is managed with tamsulosin and finasteride presents today for a 6 month follow up.  History of elevated PSA PSA Trend  bx in 2013 negative - unknown PSA level  4.66 in 2015 - started on finasteride  2.6 (5.2) ng/mL on 04/25/2014  1.6 (3.2) ng/mL on 11/22/2014  Component     Latest Ref Rng & Units 05/23/2015 11/18/2015 11/21/2015 03/10/2016  Prostate Specific Ag, Serum     0.0 - 4.0 ng/mL 1.8 2.3 2.3 2.0   Component     Latest Ref Rng & Units 11/19/2016 06/01/2017 12/30/2017 07/28/2018  Prostate Specific Ag, Serum     0.0 - 4.0 ng/mL 2.3 2.5 2.2 2.0   Component     Latest Ref Rng & Units 05/09/2019  Prostate Specific Ag, Serum     0.0 - 4.0 ng/mL 2.8   BPH WITH LUTS His IPSS score today is 9, which is moderate lower urinary tract symptomatology.  He is pleased with his quality life due to his urinary symptoms.  His previous IPSS score was 2/1.  His previous PVR was 125 mL. Patient denies any gross hematuria, dysuria or suprapubic/flank pain.  Patient denies any fevers, chills, nausea or vomiting. He is currently taking finasteride 5 mg and tamsulosin 0.4 mg daily. He had a biopsy in 2013 for which was negative. He does not have a family history of PCa.  IPSS    Row Name 05/16/19 1400         International Prostate Symptom Score   How often have you had the sensation of not emptying your bladder?  Not at All     How often have you had to urinate less than every two hours?  Less than half the time     How often have you found you stopped and started again several times when you urinated?  Less than half the time     How often have you found it difficult  to postpone urination?  Less than half the time     How often have you had a weak urinary stream?  Less than half the time     How often have you had to strain to start urination?  Not at All     How many times did you typically get up at night to urinate?  1 Time     Total IPSS Score  9       Quality of Life due to urinary symptoms   If you were to spend the rest of your life with your urinary condition just the way it is now how would you feel about that?  Pleased        Score:  1-7 Mild 8-19 Moderate 20-35 Severe  PMH: Past Medical History:  Diagnosis Date  . Anemia   . BP (high blood pressure) 05/23/2015  . BPH with obstruction/lower urinary tract symptoms   . COPD (chronic obstructive pulmonary disease) ([place])   . Dyspnea   . Encounter for long-term (current) use of other high-risk medications   . GERD (gastroesophageal reflux disease)   . GI bleed   . Gout   . H/O hernia repair  mid-90s   x2  . History of elevated PSA   . History of [facility] spotted fever   . HLD (hyperlipidemia)   . HTN (hypertension)   . Hyperglycemia   . Hyperuricemia   . Incomplete bladder emptying   . Obesity   . Psoriasis   . Psoriasis   . Urinary frequency     Surgical History: Past Surgical History:  Procedure Laterality Date  . APPENDECTOMY    . BACK SURGERY     neck and spine 2007  . CERVICAL SPINE SURGERY  2007  . CHOLECYSTECTOMY N/A 10/18/2017   Procedure: LAPAROSCOPIC CHOLECYSTECTOMY;  Surgeon: [name], MD;  Location: [facility] ORS;  Service: General;  Laterality: N/A;  . COLECTOMY  1998   partial, due to obstruction  . COLONOSCOPY     lost 10 units of blood  . ESOPHAGOGASTRODUODENOSCOPY N/A 10/14/2016   Procedure: ESOPHAGOGASTRODUODENOSCOPY (EGD);  Surgeon: [name], MD;  Location: [facility] ENDOSCOPY;  Service: Endoscopy;  Laterality: N/A;  . EYE SURGERY     bilateral cataracts  . HERNIA REPAIR  mid-90s   x2  inguinal  . PROSTATE BIOPSY  2013  . SURGERY  SCROTAL / TESTICULAR     for fibrous growth    Home Medications:  Allergies as of 05/16/2019      Reactions   Isosorbide Nitrate Other (See Comments)   Shellfish Allergy       Medication List       Accurate as of May 16, 2019  2:16 PM. If you have any questions, ask your nurse or doctor.        acetaminophen 500 MG tablet Commonly known as: TYLENOL Take 500-1,000 mg by mouth every 6 (six) hours as needed (for headache/minor pain.).   Adalimumab 40 MG/0.8ML Pnkt Inject 40 mg into the skin every 14 (fourteen) days. Thursdays   Advair Diskus 250-50 MCG/DOSE Aepb Generic drug: Fluticasone-Salmeterol Inhale 1 puff 2 (two) times daily into the lungs.   albuterol 108 (90 Base) MCG/ACT inhaler Commonly known as: VENTOLIN HFA Inhale 1-2 puffs into the lungs every 6 (six) hours as needed for wheezing or shortness of breath.   betamethasone dipropionate 0.05 % cream Apply 1 application topically 2 (two) times daily as needed (for psoriasis).   clobetasol ointment 0.05 % Commonly known as: TEMOVATE Apply topically twice daily as needed for psoriasis.   enalapril 5 MG tablet Commonly known as: VASOTEC Take 5 mg daily by mouth.   ezetimibe 10 MG tablet Commonly known as: ZETIA Take 10 mg by mouth daily.   finasteride 5 MG tablet Commonly known as: PROSCAR TAKE 1 TABLET (5 MG)  BY MOUTH  DAILY   isosorbide mononitrate 30 MG 24 hr tablet Commonly known as: IMDUR Take 30 mg by mouth daily.   metoprolol succinate 50 MG 24 hr tablet Commonly known as: TOPROL-XL Take 50 mg by mouth daily. Take with or immediately following a meal.   omeprazole 20 MG capsule Commonly known as: PRILOSEC Take 20 mg by mouth daily.   PRESERVISION AREDS 2 PO Take 2 capsules by mouth daily.   tamsulosin 0.4 MG Caps capsule Commonly known as: FLOMAX TAKE 1 CAPSULE (0.4 MG) BY MOUTH  DAILY   TART CHERRY ADVANCED PO Take 12 mg by mouth daily.       Allergies:  Allergies  Allergen  Reactions  . Isosorbide Nitrate Other (See Comments)  . Shellfish Allergy     Family History: Family History  Problem Relation Age of  Onset  . Kidney failure Brother   . Prostate cancer Neg Hx     Social History:  reports that he quit smoking about 28 years ago. He has never used smokeless tobacco. He reports current alcohol use. He reports that he does not use drugs.  ROS: UROLOGY Frequent Urination?: No Hard to postpone urination?: No Burning/pain with urination?: No Get up at night to urinate?: No Leakage of urine?: No Urine stream starts and stops?: No Trouble starting stream?: No Do you have to strain to urinate?: No Blood in urine?: No Urinary tract infection?: No Sexually transmitted disease?: No Injury to kidneys or bladder?: No Painful intercourse?: No Weak stream?: No Erection problems?: No Penile pain?: No  Gastrointestinal Nausea?: No Vomiting?: No Indigestion/heartburn?: No Diarrhea?: No Constipation?: No  Constitutional Fever: No Night sweats?: No Weight loss?: No Fatigue?: No  Skin Skin rash/lesions?: No Itching?: No  Eyes Blurred vision?: No Double vision?: No  Ears/Nose/Throat Sore throat?: No Sinus problems?: No  Hematologic/Lymphatic Swollen glands?: No Easy bruising?: Yes  Cardiovascular Leg swelling?: No Chest pain?: No  Respiratory Cough?: No Shortness of breath?: Yes  Endocrine Excessive thirst?: No  Musculoskeletal Back pain?: No Joint pain?: No  Neurological Headaches?: No Dizziness?: No  Psychologic Depression?: No Anxiety?: No  Physical Exam: BP (!) 163/89   Pulse 92   Ht 5\' 6"  (1.676 m)   Wt 180 lb (81.6 kg)   BMI 29.05 kg/m   Constitutional:  Well nourished. Alert and oriented, No acute distress. HEENT: [place] AT, moist mucus membranes.  Trachea midline, no masses. Cardiovascular: No clubbing, cyanosis, or edema. Respiratory: Normal respiratory effort, no increased work of breathing. GI: Abdomen  is soft, non tender, non distended, no abdominal masses. Liver and spleen not palpable.  No hernias appreciated.  Stool sample for occult testing is not indicated.   GU: No CVA tenderness.  No bladder fullness or masses.  Patient with uncircumcised phallus. Foreskin easily retracted  Urethral meatus is patent.  No penile discharge. No penile lesions or rashes. Scrotum without lesions, cysts, rashes and/or edema.  Testicles are located scrotally bilaterally. No masses are appreciated in the testicles. Left and right epididymis are normal. Rectal: Patient with  normal sphincter tone. Anus and perineum without scarring or rashes. No rectal masses are appreciated. Prostate is approximately 50 grams, could only palpate the apex and the midportion of the gland, no nodules are appreciated. Seminal vesicles could not be palpated.  Skin: No rashes, bruises or suspicious lesions. Lymph: No inguinal adenopathy. Neurologic: Grossly intact, no focal deficits, moving all 4 extremities. Psychiatric: Normal mood and affect.  Laboratory Data: See HPI    1. BPH with LUTS IPSS score is 9/1, it is worsening Continue conservative management, avoiding bladder irritants and timed voiding's Urinary symptoms not bothersome Continue tamsulosin 0.4 mg and finasteride 5 mg daily; refills not needed at this time RTC in 6 months for IPSS, PSA and exam   2. History of elevated PSA Current PSA stable Return to clinic in 6 months for PSA and exam  Return in about 6 months (around 11/14/2019) for IPSS, PSA and exam.  Zara Council, [facility]  [address] [place], [place] [address] [phone]

## 2019-05-16 ENCOUNTER — Encounter: Payer: Self-pay | Admitting: Urology

## 2019-05-16 ENCOUNTER — Ambulatory Visit (INDEPENDENT_AMBULATORY_CARE_PROVIDER_SITE_OTHER): Payer: Medicare Other | Admitting: Urology

## 2019-05-16 ENCOUNTER — Other Ambulatory Visit: Payer: Self-pay

## 2019-05-16 VITALS — BP 163/89 | HR 92 | Ht 66.0 in | Wt 180.0 lb

## 2019-05-16 DIAGNOSIS — N401 Enlarged prostate with lower urinary tract symptoms: Secondary | ICD-10-CM

## 2019-05-16 DIAGNOSIS — Z87898 Personal history of other specified conditions: Secondary | ICD-10-CM

## 2019-05-16 DIAGNOSIS — N400 Enlarged prostate without lower urinary tract symptoms: Secondary | ICD-10-CM | POA: Diagnosis not present

## 2019-05-16 DIAGNOSIS — N138 Other obstructive and reflux uropathy: Secondary | ICD-10-CM

## 2019-09-22 ENCOUNTER — Ambulatory Visit: Payer: Medicare Other | Attending: Internal Medicine

## 2019-09-22 ENCOUNTER — Other Ambulatory Visit: Payer: Self-pay

## 2019-09-22 ENCOUNTER — Other Ambulatory Visit
Admission: RE | Admit: 2019-09-22 | Discharge: 2019-09-22 | Disposition: A | Discharge status: Home / Self Care | Payer: Medicare Other | Source: Ambulatory Visit | Attending: Internal Medicine | Admitting: Internal Medicine

## 2019-09-22 DIAGNOSIS — Z01812 Encounter for preprocedural laboratory examination: Secondary | ICD-10-CM | POA: Diagnosis present

## 2019-09-22 DIAGNOSIS — Z20822 Contact with and (suspected) exposure to covid-19: Secondary | ICD-10-CM | POA: Diagnosis not present

## 2019-09-22 DIAGNOSIS — Z23 Encounter for immunization: Secondary | ICD-10-CM | POA: Insufficient documentation

## 2019-09-22 LAB — SARS CORONAVIRUS 2 (TAT 6-24 HRS): SARS Coronavirus 2: NEGATIVE

## 2019-09-22 NOTE — Progress Notes (Signed)
   Covid-19 Vaccination Clinic  Name:  Brian Callahan    MRN: [id number] DOB: Feb 11, 1941  09/22/2019  [name] was observed post Covid-19 immunization for 15 minutes without incidence. He was provided with Vaccine Information Sheet and instruction to access the V-Safe system.   [name] was instructed to call 911 with any severe reactions post vaccine: Marland Kitchen Difficulty breathing  . Swelling of your face and throat  . A fast heartbeat  . A bad rash all over your body  . Dizziness and weakness    Immunizations Administered    Name Date Dose VIS Date Route   Pfizer COVID-19 Vaccine 09/22/2019  9:19 AM 0.3 mL 07/07/2019 Intramuscular   Manufacturer: [place]   Lot: [id number]   [place]: [id number]

## 2019-09-25 ENCOUNTER — Other Ambulatory Visit: Payer: Medicare Other

## 2019-09-26 ENCOUNTER — Encounter: Payer: Self-pay | Admitting: Internal Medicine

## 2019-09-27 ENCOUNTER — Ambulatory Visit: Payer: Medicare Other | Admitting: Anesthesiology

## 2019-09-27 ENCOUNTER — Ambulatory Visit
Admission: RE | Admit: 2019-09-27 | Discharge: 2019-09-27 | Disposition: A | Discharge status: Home / Self Care | Payer: Medicare Other | Attending: Internal Medicine | Admitting: Internal Medicine

## 2019-09-27 ENCOUNTER — Encounter
Admission: RE | Disposition: A | Discharge status: Home / Self Care | Payer: Self-pay | Source: Home / Self Care | Attending: Internal Medicine

## 2019-09-27 ENCOUNTER — Encounter: Payer: Self-pay | Admitting: Internal Medicine

## 2019-09-27 ENCOUNTER — Other Ambulatory Visit: Payer: Self-pay

## 2019-09-27 DIAGNOSIS — I10 Essential (primary) hypertension: Secondary | ICD-10-CM | POA: Diagnosis not present

## 2019-09-27 DIAGNOSIS — E669 Obesity, unspecified: Secondary | ICD-10-CM | POA: Insufficient documentation

## 2019-09-27 DIAGNOSIS — K319 Disease of stomach and duodenum, unspecified: Secondary | ICD-10-CM | POA: Insufficient documentation

## 2019-09-27 DIAGNOSIS — Z86010 Personal history of colonic polyps: Secondary | ICD-10-CM | POA: Insufficient documentation

## 2019-09-27 DIAGNOSIS — K5730 Diverticulosis of large intestine without perforation or abscess without bleeding: Secondary | ICD-10-CM | POA: Diagnosis not present

## 2019-09-27 DIAGNOSIS — E785 Hyperlipidemia, unspecified: Secondary | ICD-10-CM | POA: Insufficient documentation

## 2019-09-27 DIAGNOSIS — Z6829 Body mass index (BMI) 29.0-29.9, adult: Secondary | ICD-10-CM | POA: Diagnosis not present

## 2019-09-27 DIAGNOSIS — J449 Chronic obstructive pulmonary disease, unspecified: Secondary | ICD-10-CM | POA: Diagnosis not present

## 2019-09-27 DIAGNOSIS — L409 Psoriasis, unspecified: Secondary | ICD-10-CM | POA: Insufficient documentation

## 2019-09-27 DIAGNOSIS — K2900 Acute gastritis without bleeding: Secondary | ICD-10-CM | POA: Insufficient documentation

## 2019-09-27 DIAGNOSIS — Z8711 Personal history of peptic ulcer disease: Secondary | ICD-10-CM | POA: Diagnosis not present

## 2019-09-27 DIAGNOSIS — K641 Second degree hemorrhoids: Secondary | ICD-10-CM | POA: Insufficient documentation

## 2019-09-27 DIAGNOSIS — Z79899 Other long term (current) drug therapy: Secondary | ICD-10-CM | POA: Diagnosis not present

## 2019-09-27 DIAGNOSIS — Z7951 Long term (current) use of inhaled steroids: Secondary | ICD-10-CM | POA: Insufficient documentation

## 2019-09-27 DIAGNOSIS — K219 Gastro-esophageal reflux disease without esophagitis: Secondary | ICD-10-CM | POA: Insufficient documentation

## 2019-09-27 DIAGNOSIS — Z1211 Encounter for screening for malignant neoplasm of colon: Secondary | ICD-10-CM | POA: Insufficient documentation

## 2019-09-27 HISTORY — PX: ESOPHAGOGASTRODUODENOSCOPY (EGD) WITH PROPOFOL: SHX5813

## 2019-09-27 HISTORY — PX: COLONOSCOPY WITH PROPOFOL: SHX5780

## 2019-09-27 LAB — KOH PREP: KOH Prep: NONE SEEN

## 2019-09-27 SURGERY — ESOPHAGOGASTRODUODENOSCOPY (EGD) WITH PROPOFOL
Anesthesia: General

## 2019-09-27 MED ORDER — PROPOFOL 10 MG/ML IV BOLUS
INTRAVENOUS | Status: DC | PRN
  Administered 2019-09-27: 50 mg via INTRAVENOUS
  Administered 2019-09-27: 70 mg via INTRAVENOUS
  Administered 2019-09-27 (×2): 30 mg via INTRAVENOUS

## 2019-09-27 MED ORDER — ESMOLOL HCL 100 MG/10ML IV SOLN
INTRAVENOUS | Status: DC | PRN
  Administered 2019-09-27: 10 mg via INTRAVENOUS
  Administered 2019-09-27 (×2): 20 mg via INTRAVENOUS

## 2019-09-27 MED ORDER — PROPOFOL 10 MG/ML IV BOLUS
INTRAVENOUS | Status: AC
  Filled 2019-09-27: qty 100

## 2019-09-27 MED ORDER — SODIUM CHLORIDE 0.9 % IV SOLN: INTRAVENOUS | Status: DC

## 2019-09-27 MED ORDER — LIDOCAINE HCL (CARDIAC) PF 100 MG/5ML IV SOSY
PREFILLED_SYRINGE | INTRAVENOUS | Status: DC | PRN
  Administered 2019-09-27: 100 mg via INTRAVENOUS

## 2019-09-27 MED ORDER — PROPOFOL 500 MG/50ML IV EMUL
INTRAVENOUS | Status: DC | PRN
  Administered 2019-09-27: 100 ug/kg/min via INTRAVENOUS

## 2019-09-27 NOTE — H&P (Signed)
Outpatient short stay form Pre-procedure 09/27/2019 8:25 AM Evolett Somarriba K. Alice Reichert, M.D.  Primary Physician: Georgie Chard, M.D.  Reason for visit:  Hx of gastric ulcer/gastritis, personal hx of tubular adenoma (Colon in 2019)  History of present illness: As above. Patient denies change in bowel habits, rectal bleeding, weight loss or abdominal pain.  Patient denies change in bowel habits, rectal bleeding, weight loss or abdominal pain.     Current Facility-Administered Medications:  .  0.9 %  sodium chloride infusion, , Intravenous, Continuous, Awesome Jared, [name], MD  Medications Prior to Admission  Medication Sig Dispense Refill Last Dose  . acetaminophen (TYLENOL) 500 MG tablet Take 500-1,000 mg by mouth every 6 (six) hours as needed (for headache/minor pain.).   Past Month at Unknown time  . Adalimumab 40 MG/0.8ML PNKT Inject 40 mg into the skin every 14 (fourteen) days. Thursdays   Past Month at Unknown time  . albuterol (PROVENTIL HFA;VENTOLIN HFA) 108 (90 Base) MCG/ACT inhaler Inhale 1-2 puffs into the lungs every 6 (six) hours as needed for wheezing or shortness of breath.    Not Taking at Unknown time  . betamethasone dipropionate (DIPROLENE) 0.05 % cream Apply 1 application topically 2 (two) times daily as needed (for psoriasis).     . clobetasol ointment (TEMOVATE) 0.05 % Apply topically twice daily as needed for psoriasis.     Marland Kitchen enalapril (VASOTEC) 5 MG tablet Take 5 mg daily by mouth.    09/25/2019  . ezetimibe (ZETIA) 10 MG tablet Take 10 mg by mouth daily.   09/25/2019  . finasteride (PROSCAR) 5 MG tablet TAKE 1 TABLET (5 MG)  BY MOUTH  DAILY 90 tablet 3 09/25/2019  . Fluticasone-Salmeterol (ADVAIR DISKUS) 250-50 MCG/DOSE AEPB Inhale 1 puff 2 (two) times daily into the lungs.    09/24/2019  . isosorbide mononitrate (IMDUR) 30 MG 24 hr tablet Take 30 mg by mouth daily.   Not Taking at Unknown time  . metoprolol succinate (TOPROL-XL) 50 MG 24 hr tablet Take 50 mg by mouth daily. Take with or  immediately following a meal.   09/25/2019  . Misc Natural Products (TART CHERRY ADVANCED PO) Take 12 mg by mouth daily.      . Multiple Vitamins-Minerals (PRESERVISION AREDS 2 PO) Take 2 capsules by mouth daily.    09/25/2019  . omeprazole (PRILOSEC) 20 MG capsule Take 20 mg by mouth daily.   09/25/2019  . tamsulosin (FLOMAX) 0.4 MG CAPS capsule TAKE 1 CAPSULE (0.4 MG) BY MOUTH  DAILY (Patient not taking: Reported on 09/27/2019) 90 capsule 3 Not Taking at Unknown time     Allergies  Allergen Reactions  . Isosorbide Nitrate Other (See Comments)  . Shellfish Allergy      Past Medical History:  Diagnosis Date  . Anemia   . BP (high blood pressure) 05/23/2015  . BPH with obstruction/lower urinary tract symptoms   . COPD (chronic obstructive pulmonary disease) ([place])   . Dyspnea   . Encounter for long-term (current) use of other high-risk medications   . GERD (gastroesophageal reflux disease)   . GI bleed   . Gout   . H/O hernia repair mid-90s   x2  . History of elevated PSA   . History of [facility] spotted fever   . HLD (hyperlipidemia)   . HTN (hypertension)   . Hyperglycemia   . Hyperuricemia   . Incomplete bladder emptying   . Obesity   . Psoriasis   . Psoriasis   . Urinary frequency  Review of systems:  Otherwise negative.    Physical Exam  Gen: Alert, oriented. Appears stated age.  HEENT: [place]/AT. PERRLA. Lungs: CTA, no wheezes. CV: RR nl S1, S2. Abd: soft, benign, no masses. BS+ Ext: No edema. Pulses 2+    Planned procedures: Proceed with EGD and colonoscopy. The patient understands the nature of the planned procedure, indications, risks, alternatives and potential complications including but not limited to bleeding, infection, perforation, damage to internal organs and possible oversedation/side effects from anesthesia. The patient agrees and gives consent to proceed.  Please refer to procedure notes for findings, recommendations and patient  disposition/instructions.     Haze Antillon K. Alice Reichert, M.D. Gastroenterology 09/27/2019  8:25 AM

## 2019-09-27 NOTE — Op Note (Signed)
[facility] Gastroenterology Patient Name: Brian Callahan Procedure Date: 09/27/2019 9:22 AM MRN: [id number] Account #: [id number] Date of Birth: [birth year] Admit Type: Outpatient Age: 79 Room: [facility] ENDO ROOM 4 Gender: Male Note Status: Finalized Procedure:             Upper GI endoscopy Indications:           Epigastric abdominal pain, Follow-up of acute                         gastritis, Personal history of peptic ulcer disease Providers:             [name]. Kayra Crowell MD, MD Medicines:             Propofol per Anesthesia Complications:         No immediate complications. Procedure:             Pre-Anesthesia Assessment:                        - The risks and benefits of the procedure and the                         sedation options and risks were discussed with the                         patient. All questions were answered and informed                         consent was obtained.                        - Patient identification and proposed procedure were                         verified prior to the procedure by the nurse. The                         procedure was verified in the procedure room.                        - ASA Grade Assessment: III - A patient with severe                         systemic disease.                        - After reviewing the risks and benefits, the patient                         was deemed in satisfactory condition to undergo the                         procedure.                        After obtaining informed consent, the endoscope was                         passed under direct vision. Throughout the procedure,  the patient's blood pressure, pulse, and oxygen                         saturations were monitored continuously. The Endoscope                         was introduced through the mouth, and advanced to the                         third part of duodenum. The upper GI endoscopy was        accomplished without difficulty. The patient tolerated                         the procedure well. Findings:      Diffuse, white plaques were found in the entire esophagus. Cells for       cytology were obtained by brushing.      Scattered moderate inflammation characterized by congestion (edema) and       erythema was found in the entire examined stomach. Biopsies were taken       with a cold forceps for Helicobacter pylori testing.      The examined duodenum was normal. Impression:            - Esophageal plaques were found, suspicious for                         candidiasis. Cells for cytology obtained.                        - Gastritis. Biopsied.                        - Normal examined duodenum. Recommendation:        - Await pathology results.                        - Proceed with colonoscopy Procedure Code(s):     --- Professional ---                        [id number], Esophagogastroduodenoscopy, flexible,                         transoral; with biopsy, single or multiple Diagnosis Code(s):     --- Professional ---                        Z87.11, Personal history of peptic ulcer disease                        K29.00, Acute gastritis without bleeding                        R10.13, Epigastric pain                        K29.70, Gastritis, unspecified, without bleeding                        K22.9, Disease of esophagus, unspecified CPT copyright 2019 American Medical Association. All rights reserved. The codes documented in this report are preliminary and upon coder review  may  be revised to meet current compliance requirements. Efrain Sella MD, MD 09/27/2019 9:47:48 AM This report has been signed electronically. Number of Addenda: 0 Note Initiated On: 09/27/2019 9:22 AM Estimated Blood Loss:  Estimated blood loss: none.      [facility]

## 2019-09-27 NOTE — Anesthesia Preprocedure Evaluation (Signed)
Anesthesia Evaluation  Patient identified by MRN, date of birth, ID band Patient awake    Reviewed: Allergy & Precautions, H&P , NPO status , Patient's Chart, lab work & pertinent test results  History of Anesthesia Complications Negative for: history of anesthetic complications  Airway Mallampati: III  TM Distance: >3 FB Neck ROM: limited    Dental  (+) Chipped, Poor Dentition, Missing   Pulmonary shortness of breath and with exertion, COPD, former smoker,           Cardiovascular Exercise Tolerance: Good hypertension, (-) angina(-) Past MI and (-) DOE negative cardio ROS       Neuro/Psych negative neurological ROS  negative psych ROS   GI/Hepatic Neg liver ROS, GERD  Medicated and Controlled,  Endo/Other  diabetes, Type 2  Renal/GU negative Renal ROS  negative genitourinary   Musculoskeletal   Abdominal   Peds  Hematology negative hematology ROS (+)   Anesthesia Other Findings Past Medical History: No date: Anemia 05/23/2015: BP (high blood pressure) No date: BPH with obstruction/lower urinary tract symptoms No date: COPD (chronic obstructive pulmonary disease) ([facility]) No date: Dyspnea No date: Encounter for long-term (current) use of other high-risk  medications No date: GERD (gastroesophageal reflux disease) No date: GI bleed No date: Gout mid-90s: H/O hernia repair     Comment:  x2 No date: History of elevated PSA No date: History of [place] spotted fever No date: HLD (hyperlipidemia) No date: HTN (hypertension) No date: Hyperglycemia No date: Hyperuricemia No date: Incomplete bladder emptying No date: Obesity No date: Psoriasis No date: Psoriasis No date: Urinary frequency  Past Surgical History: No date: APPENDECTOMY No date: BACK SURGERY     Comment:  neck and spine 2007 2007: CERVICAL SPINE SURGERY 10/18/2017: CHOLECYSTECTOMY; N/A     Comment:  Procedure: LAPAROSCOPIC  CHOLECYSTECTOMY;  Surgeon:               [name], MD;  Location: [facility] ORS;  Service:              General;  Laterality: N/A; 1998: COLECTOMY     Comment:  partial, due to obstruction No date: COLONOSCOPY     Comment:  lost 10 units of blood 10/14/2016: ESOPHAGOGASTRODUODENOSCOPY; N/A     Comment:  Procedure: ESOPHAGOGASTRODUODENOSCOPY (EGD);  Surgeon:               [name], MD;  Location: [facility] ENDOSCOPY;                Service: Endoscopy;  Laterality: N/A; No date: EYE SURGERY     Comment:  bilateral cataracts mid-90s: HERNIA REPAIR     Comment:  x2  inguinal 2013: PROSTATE BIOPSY No date: SURGERY SCROTAL / TESTICULAR     Comment:  for fibrous growth  BMI    Body Mass Index: 29.05 kg/m      Reproductive/Obstetrics negative OB ROS                             Anesthesia Physical Anesthesia Plan  ASA: III  Anesthesia Plan: General   Post-op Pain Management:    Induction: Intravenous  PONV Risk Score and Plan: Propofol infusion and TIVA  Airway Management Planned: Natural Airway and Nasal Cannula  Additional Equipment:   Intra-op Plan:   Post-operative Plan:   Informed Consent: I have reviewed the patients History and Physical, chart, labs and discussed the procedure including the risks, benefits and  alternatives for the proposed anesthesia with the patient or authorized representative who has indicated his/her understanding and acceptance.     Dental Advisory Given  Plan Discussed with: Anesthesiologist, CRNA and Surgeon  Anesthesia Plan Comments: (Patient consented for risks of anesthesia including but not limited to:  - adverse reactions to medications - risk of intubation if required - damage to teeth, lips or other oral mucosa - sore throat or hoarseness - Damage to heart, brain, lungs or loss of life  Patient voiced understanding.)        Anesthesia Quick Evaluation

## 2019-09-27 NOTE — Transfer of Care (Signed)
Immediate Anesthesia Transfer of Care Note  Patient: Brian Callahan  Procedure(s) Performed: ESOPHAGOGASTRODUODENOSCOPY (EGD) WITH PROPOFOL (N/A ) COLONOSCOPY WITH PROPOFOL (N/A )  Patient Location: PACU and Endoscopy Unit  Anesthesia Type:General  Level of Consciousness: awake, drowsy and patient cooperative  Airway & Oxygen Therapy: Patient Spontanous Breathing  Post-op Assessment: Report given to RN and Post -op Vital signs reviewed and stable  Post vital signs: Reviewed and stable  Last Vitals:  Vitals Value Taken Time  BP 91/76 09/27/19 1001  Temp 36.2 C 09/27/19 1000  Pulse 122 09/27/19 1004  Resp 17 09/27/19 1004  SpO2 96 % 09/27/19 1004  Vitals shown include unvalidated device data.  Last Pain:  Vitals:   09/27/19 1000  TempSrc: Temporal  PainSc: Asleep         Complications: No apparent anesthesia complications

## 2019-09-27 NOTE — Op Note (Signed)
[facility] Gastroenterology Patient Name: Brian Callahan Procedure Date: 09/27/2019 9:22 AM MRN: [id number] Account #: [id number] Date of Birth: 03-29-1941 Admit Type: Outpatient Age: 79 Room: [facility] ENDO ROOM 4 Gender: Male Note Status: Finalized Procedure:             Colonoscopy Indications:           High risk colon cancer surveillance: Personal history                         of colonic polyps Providers:             [name]. Gara Kincade MD, MD Medicines:             Propofol per Anesthesia Complications:         No immediate complications. Procedure:             Pre-Anesthesia Assessment:                        - The risks and benefits of the procedure and the                         sedation options and risks were discussed with the                         patient. All questions were answered and informed                         consent was obtained.                        - Patient identification and proposed procedure were                         verified prior to the procedure by the nurse. The                         procedure was verified in the procedure room.                        - ASA Grade Assessment: III - A patient with severe                         systemic disease.                        - After reviewing the risks and benefits, the patient                         was deemed in satisfactory condition to undergo the                         procedure.                        After obtaining informed consent, the colonoscope was                         passed under direct vision. Throughout the procedure,  the patient's blood pressure, pulse, and oxygen                         saturations were monitored continuously. The                         Colonoscope was introduced through the anus and                         advanced to the the cecum, identified by appendiceal                         orifice and ileocecal valve. The  colonoscopy was                         performed without difficulty. The patient tolerated                         the procedure well. The quality of the bowel                         preparation was good. The ileocecal valve, appendiceal                         orifice, and rectum were photographed. Findings:      The perianal and digital rectal examinations were normal. Pertinent       negatives include normal sphincter tone and no palpable rectal lesions.      Non-bleeding internal hemorrhoids were found during retroflexion. The       hemorrhoids were Grade II (internal hemorrhoids that prolapse but reduce       spontaneously).      Multiple small and large-mouthed diverticula were found in the entire       colon. There was no evidence of diverticular bleeding.      The exam was otherwise without abnormality. Impression:            - Non-bleeding internal hemorrhoids.                        - Moderate diverticulosis in the entire examined                         colon. There was no evidence of diverticular bleeding.                        - The examination was otherwise normal.                        - No specimens collected. Recommendation:        - Patient has a contact number available for                         emergencies. The signs and symptoms of potential                         delayed complications were discussed with the patient.                         Return to normal activities  tomorrow. Written                         discharge instructions were provided to the patient.                        - Resume previous diet.                        - Continue present medications.                        - No repeat colonoscopy due to current age (59 years                         or older) and the absence of colonic polyps.                        - Return to physician assistant in 1 year.                        - The findings and recommendations were discussed with                          the patient. Procedure Code(s):     --- Professional ---                        [id number], Colorectal cancer screening; colonoscopy on                         individual at high risk Diagnosis Code(s):     --- Professional ---                        K57.30, Diverticulosis of large intestine without                         perforation or abscess without bleeding                        K64.1, Second degree hemorrhoids                        Z86.010, Personal history of colonic polyps CPT copyright 2019 American Medical Association. All rights reserved. The codes documented in this report are preliminary and upon coder review may  be revised to meet current compliance requirements. Efrain Sella MD, MD 09/27/2019 10:00:43 AM This report has been signed electronically. Number of Addenda: 0 Note Initiated On: 09/27/2019 9:22 AM Scope Withdrawal Time: 0 hours 5 minutes 42 seconds  Total Procedure Duration: 0 hours 7 minutes 11 seconds  Estimated Blood Loss:  Estimated blood loss: none.      [facility]

## 2019-09-27 NOTE — Interval H&P Note (Signed)
History and Physical Interval Note:  09/27/2019 8:26 AM  Brian Callahan  has presented today for surgery, with the diagnosis of GERD,CHRONIC GASTRITIS,HX.OF COLON POLYPS.  The various methods of treatment have been discussed with the patient and family. After consideration of risks, benefits and other options for treatment, the patient has consented to  Procedure(s): ESOPHAGOGASTRODUODENOSCOPY (EGD) WITH PROPOFOL (N/A) COLONOSCOPY WITH PROPOFOL (N/A) as a surgical intervention.  The patient's history has been reviewed, patient examined, no change in status, stable for surgery.  I have reviewed the patient's chart and labs.  Questions were answered to the patient's satisfaction.     [place], [place]

## 2019-09-27 NOTE — Brief Op Note (Signed)
Esophageal brushing to r/o Candida sent to lab.

## 2019-09-27 NOTE — Anesthesia Postprocedure Evaluation (Signed)
Anesthesia Post Note  Patient: Brian Callahan  Procedure(s) Performed: ESOPHAGOGASTRODUODENOSCOPY (EGD) WITH PROPOFOL (N/A ) COLONOSCOPY WITH PROPOFOL (N/A )  Patient location during evaluation: Endoscopy Anesthesia Type: General Level of consciousness: awake and alert Pain management: pain level controlled Vital Signs Assessment: post-procedure vital signs reviewed and stable Respiratory status: spontaneous breathing, nonlabored ventilation, respiratory function stable and patient connected to nasal cannula oxygen Cardiovascular status: blood pressure returned to baseline and stable Postop Assessment: no apparent nausea or vomiting Anesthetic complications: no     Last Vitals:  Vitals:   09/27/19 1020 09/27/19 1032  BP: (!) 157/98 (!) 167/92  Pulse: (!) 124 (!) 114  Resp: 18 20  Temp:    SpO2: 97%     Last Pain:  Vitals:   09/27/19 1032  TempSrc:   PainSc: 0-No pain                 Precious Haws Jamarrius Salay

## 2019-09-28 ENCOUNTER — Encounter: Payer: Self-pay | Admitting: *Deleted

## 2019-09-28 LAB — SURGICAL PATHOLOGY

## 2019-10-18 ENCOUNTER — Ambulatory Visit: Payer: Medicare Other | Attending: Internal Medicine

## 2019-10-18 DIAGNOSIS — Z23 Encounter for immunization: Secondary | ICD-10-CM

## 2019-10-18 NOTE — Progress Notes (Signed)
   Covid-19 Vaccination Clinic  Name:  Brian Callahan    MRN: [id number] DOB: [birth year]  10/18/2019  Brian Callahan was observed post Covid-19 immunization for 15 minutes without incident. He was provided with Vaccine Information Sheet and instruction to access the V-Safe system.   Brian Callahan was instructed to call 911 with any severe reactions post vaccine: Marland Kitchen Difficulty breathing  . Swelling of face and throat  . A fast heartbeat  . A bad rash all over body  . Dizziness and weakness   Immunizations Administered    Name Date Dose VIS Date Route   Pfizer COVID-19 Vaccine 10/18/2019 12:12 PM 0.3 mL 07/07/2019 Intramuscular   Manufacturer: [place]   Lot: [id number]   [place]: [id number]

## 2019-10-31 NOTE — Unmapped (Signed)
This encounter was created in error - please disregard.

## 2019-11-04 NOTE — Progress Notes (Signed)
Cardiology Office Note  Date:  11/06/2019   ID:  Taelin, Scatena 10-06-1940, MRN [id number]  PCP:  Idelle Crouch, MD   Chief Complaint  Patient presents with  . office visit    12 month F/U; Meds verbally reviewed with patient.    HPI:  Mr. Mckoy Targett is a 79 year old gentleman with past medical history of former smoker, quit 25 years ago Abnormal stress tests, Chest pain Prior GI bleed requiring hospitalization, mesenteric artery embolization Gastritis, GERD shows normal sinus rhythm with rate 72 bpm no significant ST or T wave changes Shortness of breath on exertion Hyperlipidemia, on zetia Heavy drinker in the past Prior  Cardiac cath 2010 no significant coronary disease Who presents for f/u of his chest pain  Hard of hearing on today's visit Dr. Doy Hutching recently increased enalapril up to 5 twice a day He has not been checking pressure at home since the medication change was made  No chest  Pain On prior office visit reported having twinges in his chest, these have resolved Sedentary, hip pain limits his activities Does not even walk the dog anymore secondary to his hips Slipped in the shower, did not fall  Lab work reviewed Total chol 170, LDL 90 HBA1C 6.7  Carotid u/s 06/2017 Minimal to moderate amount of bilateral atherosclerotic plaque, right greater than left,   CT angio chest 2015 Images reviewed Mild scattered atherosclerosis in the abdominal aorta  EKG personally reviewed by myself on todays visit Shows normal sinus rhythm rate 81 bpm no significant ST or T wave changes  Other past medical history reviewed Prior heart cath 2010 or 2011 "95% open in all your arteries"  Carotid u/s Minimal to moderate amount of bilateral atherosclerotic plaque, right greater than left, not resulting in a hemodynamically significant stenosis within either internal carotid artery.  Previously seen by cardiology 09/28/2017 at [facility]  2D echocardiogram  07/15/2017 revealed LVEF of 50% with moderate aortic insufficiency.   [place] 07/15/2017 revealed LV ejection fraction of 63% with mild inferior wall ischemia. "Mild perfusion abnormality of mild intensity"  10/18/17 had gall bladder out, Treated by Dr. Tiffany Kocher   PMH:   has a past medical history of Anemia, BP (high blood pressure) (05/23/2015), BPH with obstruction/lower urinary tract symptoms, COPD (chronic obstructive pulmonary disease) ([place]), Dyspnea, Encounter for long-term (current) use of other high-risk medications, GERD (gastroesophageal reflux disease), GI bleed, Gout, H/O hernia repair (mid-90s), History of elevated PSA, History of [facility] spotted fever, HLD (hyperlipidemia), HTN (hypertension), Hyperglycemia, Hyperuricemia, Incomplete bladder emptying, Obesity, Psoriasis, Psoriasis, and Urinary frequency.  PSH:    Past Surgical History:  Procedure Laterality Date  . APPENDECTOMY    . BACK SURGERY     neck and spine 2007  . CERVICAL SPINE SURGERY  2007  . CHOLECYSTECTOMY N/A 10/18/2017   Procedure: LAPAROSCOPIC CHOLECYSTECTOMY;  Surgeon: [name], MD;  Location: [facility] ORS;  Service: General;  Laterality: N/A;  . COLECTOMY  1998   partial, due to obstruction  . COLONOSCOPY     lost 10 units of blood  . COLONOSCOPY WITH PROPOFOL N/A 09/27/2019   Procedure: COLONOSCOPY WITH PROPOFOL;  Surgeon: [name], [name], MD;  Location: [facility] ENDOSCOPY;  Service: Gastroenterology;  Laterality: N/A;  . ESOPHAGOGASTRODUODENOSCOPY N/A 10/14/2016   Procedure: ESOPHAGOGASTRODUODENOSCOPY (EGD);  Surgeon: [name], MD;  Location: [facility] ENDOSCOPY;  Service: Endoscopy;  Laterality: N/A;  . ESOPHAGOGASTRODUODENOSCOPY (EGD) WITH PROPOFOL N/A 09/27/2019   Procedure: ESOPHAGOGASTRODUODENOSCOPY (EGD) WITH PROPOFOL;  Surgeon: [place],  [name], MD;  Location: [facility] ENDOSCOPY;  Service: Gastroenterology;  Laterality: N/A;  . EYE SURGERY     bilateral cataracts  . HERNIA REPAIR   mid-90s   x2  inguinal  . PROSTATE BIOPSY  2013  . SURGERY SCROTAL / TESTICULAR     for fibrous growth    Current Outpatient Medications  Medication Sig Dispense Refill  . acetaminophen (TYLENOL) 500 MG tablet Take 500-1,000 mg by mouth every 6 (six) hours as needed (for headache/minor pain.).    Marland Kitchen albuterol (PROVENTIL HFA;VENTOLIN HFA) 108 (90 Base) MCG/ACT inhaler Inhale 1-2 puffs into the lungs every 6 (six) hours as needed for wheezing or shortness of breath.     . enalapril (VASOTEC) 5 MG tablet Take 1 tablet (5 mg total) by mouth 2 (two) times daily.    Marland Kitchen ezetimibe (ZETIA) 10 MG tablet Take 10 mg by mouth daily.    . finasteride (PROSCAR) 5 MG tablet TAKE 1 TABLET (5 MG)  BY MOUTH  DAILY 90 tablet 3  . Fluticasone-Salmeterol (ADVAIR DISKUS) 250-50 MCG/DOSE AEPB Inhale 1 puff 2 (two) times daily into the lungs.     . metoprolol succinate (TOPROL-XL) 50 MG 24 hr tablet Take 50 mg by mouth daily. Take with or immediately following a meal.    . Misc Natural Products (TART CHERRY ADVANCED PO) Take 12 mg by mouth daily.     . Multiple Vitamins-Minerals (PRESERVISION AREDS 2 PO) Take 2 capsules by mouth daily.     Marland Kitchen omeprazole (PRILOSEC) 20 MG capsule Take 20 mg by mouth daily.    . Adalimumab 40 MG/0.8ML PNKT Inject 40 mg into the skin every 14 (fourteen) days. Thursdays    . betamethasone dipropionate (DIPROLENE) 0.05 % cream Apply 1 application topically 2 (two) times daily as needed (for psoriasis).    . clobetasol ointment (TEMOVATE) 0.05 % Apply topically twice daily as needed for psoriasis.    Marland Kitchen isosorbide mononitrate (IMDUR) 30 MG 24 hr tablet Take 30 mg by mouth daily.     No current facility-administered medications for this visit.     Allergies:   Isosorbide nitrate and Shellfish allergy   Social History:  The patient  reports that he quit smoking about 29 years ago. He has never used smokeless tobacco. He reports current alcohol use. He reports that he does not use drugs.    Family History:   family history includes Kidney failure in his brother.    Review of Systems: Review of Systems  Constitutional: Negative.   HENT: Negative.   Respiratory: Negative.   Cardiovascular: Negative.   Gastrointestinal: Negative.   Musculoskeletal: Negative.   Neurological: Negative.   Psychiatric/Behavioral: Negative.   All other systems reviewed and are negative.    PHYSICAL EXAM: VS:  BP 140/70 (BP Location: Left Arm, Patient Position: Sitting, Cuff Size: Normal)   Pulse 81   Temp 99 F (37.2 C)   Ht 5\' 7"  (1.702 m)   Wt 185 lb 4 oz (84 kg)   SpO2 95%   BMI 29.01 kg/m  , BMI Body mass index is 29.01 kg/m. Constitutional:  oriented to person, place, and time. No distress.  HENT:  Head: Grossly normal Eyes:  no discharge. No scleral icterus.  Neck: No JVD, no carotid bruits  Cardiovascular: Regular rate and rhythm, no murmurs appreciated Pulmonary/Chest: Clear to auscultation bilaterally, no wheezes or rails Abdominal: Soft.  no distension.  no tenderness.  Musculoskeletal: Normal range of motion Neurological:  normal muscle tone.  Coordination normal. No atrophy Skin: Skin warm and dry Psychiatric: normal affect, pleasant  Recent Labs: No results found for requested labs within last 8760 hours.    Lipid Panel No results found for: CHOL, HDL, LDLCALC, TRIG    Wt Readings from Last 3 Encounters:  11/06/19 185 lb 4 oz (84 kg)  09/27/19 180 lb (81.6 kg)  05/16/19 180 lb (81.6 kg)     ASSESSMENT AND PLAN:  Chest pain Atypical in nature, describes them as a twinge at rest  These twinges have resolved, no further work-up needed at this time  Gastrointestinal hemorrhage, unspecified gastrointestinal hemorrhage type  2 GI bleeds in the past Denies any further bleeding Stable  Essential hypertension Blood pressure elevated on today's visit, enalapril recently increased Suspect blood pressure elevated from rushing into the office, Recommend he  monitor blood pressure at home and call us with some numbers if he continues to run high He is scheduled to have follow-up with Dr. Doy Hutching  Mixed hyperlipidemia On zetia Given aortic atherosclerosis, could consider starting Crestor 5 daily to achieve goal LDL less than 70  Anemia, unspecified type  Managed by primary care  Disposition:   F/U as needed   Total encounter time more than 25 minutes  Greater than 50% was spent in counseling and coordination of care with the patient   Orders Placed This Encounter  Procedures  . EKG 12-Lead     Signed, Esmond Plants, M.D., Ph.D. 11/06/2019  [place], [place]

## 2019-11-06 ENCOUNTER — Encounter: Payer: Self-pay | Admitting: Cardiovascular Disease

## 2019-11-06 ENCOUNTER — Ambulatory Visit (INDEPENDENT_AMBULATORY_CARE_PROVIDER_SITE_OTHER): Payer: Medicare Other | Admitting: Cardiovascular Disease

## 2019-11-06 ENCOUNTER — Other Ambulatory Visit: Payer: Self-pay

## 2019-11-06 ENCOUNTER — Other Ambulatory Visit: Payer: Self-pay | Admitting: *Deleted

## 2019-11-06 VITALS — BP 140/70 | HR 81 | Temp 99.0°F | Ht 67.0 in | Wt 185.2 lb

## 2019-11-06 DIAGNOSIS — Z87898 Personal history of other specified conditions: Secondary | ICD-10-CM

## 2019-11-06 DIAGNOSIS — R079 Chest pain, unspecified: Secondary | ICD-10-CM | POA: Diagnosis not present

## 2019-11-06 DIAGNOSIS — E782 Mixed hyperlipidemia: Secondary | ICD-10-CM

## 2019-11-06 DIAGNOSIS — D649 Anemia, unspecified: Secondary | ICD-10-CM

## 2019-11-06 DIAGNOSIS — I10 Essential (primary) hypertension: Secondary | ICD-10-CM | POA: Diagnosis not present

## 2019-11-06 DIAGNOSIS — I739 Peripheral vascular disease, unspecified: Secondary | ICD-10-CM | POA: Diagnosis not present

## 2019-11-06 NOTE — Patient Instructions (Signed)

## 2019-11-07 ENCOUNTER — Other Ambulatory Visit: Payer: Medicare Other

## 2019-11-07 DIAGNOSIS — Z87898 Personal history of other specified conditions: Secondary | ICD-10-CM

## 2019-11-08 LAB — PSA: Prostate Specific Ag, Serum: 4.1 ng/mL — ABNORMAL HIGH (ref 0.0–4.0)

## 2019-11-12 ENCOUNTER — Other Ambulatory Visit: Payer: Self-pay | Admitting: Urology

## 2019-11-12 DIAGNOSIS — N400 Enlarged prostate without lower urinary tract symptoms: Secondary | ICD-10-CM

## 2019-11-14 ENCOUNTER — Ambulatory Visit (INDEPENDENT_AMBULATORY_CARE_PROVIDER_SITE_OTHER): Payer: Medicare Other | Admitting: Urology

## 2019-11-14 ENCOUNTER — Other Ambulatory Visit: Payer: Self-pay

## 2019-11-14 ENCOUNTER — Encounter: Payer: Self-pay | Admitting: Urology

## 2019-11-14 VITALS — BP 164/77 | HR 73 | Ht 67.0 in | Wt 181.0 lb

## 2019-11-14 DIAGNOSIS — N401 Enlarged prostate with lower urinary tract symptoms: Secondary | ICD-10-CM

## 2019-11-14 DIAGNOSIS — R972 Elevated prostate specific antigen [PSA]: Secondary | ICD-10-CM | POA: Diagnosis not present

## 2019-11-14 DIAGNOSIS — N138 Other obstructive and reflux uropathy: Secondary | ICD-10-CM | POA: Diagnosis not present

## 2019-11-14 LAB — BLADDER SCAN AMB NON-IMAGING: Scan Result: 259

## 2019-11-14 NOTE — Progress Notes (Signed)
08/18/2018 1:56 PM   Brian Callahan 1940/12/27 [id number]  Referring provider: Idelle Crouch, MD [facility] [place],  [place] [address]  Chief Complaint  Patient presents with  . Benign Prostatic Hypertrophy    HPI: Patient is 79 year old male with BPH and LUTS and a history of elevated PSA that is managed with tamsulosin and finasteride presents today for a 6 month follow up.  History of elevated PSA PSA Trend  bx in 2013 negative - unknown PSA level  4.66 in 2015 - started on finasteride  2.6 (5.2) ng/mL on 04/25/2014  1.6 (3.2) ng/mL on 11/22/2014  Component     Latest Ref Rng & Units 05/23/2015 11/18/2015 11/21/2015 03/10/2016  Prostate Specific Ag, Serum     0.0 - 4.0 ng/mL 1.8 2.3 2.3 2.0   Component     Latest Ref Rng & Units 11/19/2016 06/01/2017 12/30/2017 07/28/2018  Prostate Specific Ag, Serum     0.0 - 4.0 ng/mL 2.3 2.5 2.2 2.0   Component     Latest Ref Rng & Units 05/09/2019 11/07/2019  Prostate Specific Ag, Serum     0.0 - 4.0 ng/mL 2.8 4.1 (H)   BPH WITH LUTS His IPSS score today is 2, which is mild lower urinary tract symptomatology.  He is pleased with his quality life due to his urinary symptoms.  His PVR is 259 mL.  His previous IPSS score was 9/1.  His previous PVR was 125 mL. Patient denies any gross hematuria, dysuria or suprapubic/flank pain.  Patient denies any fevers, chills, nausea or vomiting.  He is currently taking finasteride 5 mg and tamsulosin 0.4 mg daily. He had a biopsy in 2013 for which was negative. He does not have a family history of PCa.  IPSS    Row Name 11/14/19 1400         International Prostate Symptom Score   How often have you had the sensation of not emptying your bladder?  Not at All     How often have you had to urinate less than every two hours?  Less than 1 in 5 times     How often have you found you stopped and started again several times when you urinated?  Not at All     How often have  you found it difficult to postpone urination?  Not at All     How often have you had a weak urinary stream?  Not at All     How often have you had to strain to start urination?  Not at All     How many times did you typically get up at night to urinate?  1 Time     Total IPSS Score  2       Quality of Life due to urinary symptoms   If you were to spend the rest of your life with your urinary condition just the way it is now how would you feel about that?  Pleased        Score:  1-7 Mild 8-19 Moderate 20-35 Severe  PMH: Past Medical History:  Diagnosis Date  . Anemia   . BP (high blood pressure) 05/23/2015  . BPH with obstruction/lower urinary tract symptoms   . COPD (chronic obstructive pulmonary disease) ([place])   . Dyspnea   . Encounter for long-term (current) use of other high-risk medications   . GERD (gastroesophageal reflux disease)   . GI bleed   . Gout   .  H/O hernia repair mid-90s   x2  . History of elevated PSA   . History of [facility] spotted fever   . HLD (hyperlipidemia)   . HTN (hypertension)   . Hyperglycemia   . Hyperuricemia   . Incomplete bladder emptying   . Obesity   . Psoriasis   . Psoriasis   . Urinary frequency     Surgical History: Past Surgical History:  Procedure Laterality Date  . APPENDECTOMY    . BACK SURGERY     neck and spine 2007  . CERVICAL SPINE SURGERY  2007  . CHOLECYSTECTOMY N/A 10/18/2017   Procedure: LAPAROSCOPIC CHOLECYSTECTOMY;  Surgeon: [name], MD;  Location: [facility] ORS;  Service: General;  Laterality: N/A;  . COLECTOMY  1998   partial, due to obstruction  . COLONOSCOPY     lost 10 units of blood  . COLONOSCOPY WITH PROPOFOL N/A 09/27/2019   Procedure: COLONOSCOPY WITH PROPOFOL;  Surgeon: [name], [name], MD;  Location: [facility] ENDOSCOPY;  Service: Gastroenterology;  Laterality: N/A;  . ESOPHAGOGASTRODUODENOSCOPY N/A 10/14/2016   Procedure: ESOPHAGOGASTRODUODENOSCOPY (EGD);  Surgeon: [name], MD;   Location: [facility] ENDOSCOPY;  Service: Endoscopy;  Laterality: N/A;  . ESOPHAGOGASTRODUODENOSCOPY (EGD) WITH PROPOFOL N/A 09/27/2019   Procedure: ESOPHAGOGASTRODUODENOSCOPY (EGD) WITH PROPOFOL;  Surgeon: [name], [name], MD;  Location: [facility] ENDOSCOPY;  Service: Gastroenterology;  Laterality: N/A;  . EYE SURGERY     bilateral cataracts  . HERNIA REPAIR  mid-90s   x2  inguinal  . PROSTATE BIOPSY  2013  . SURGERY SCROTAL / TESTICULAR     for fibrous growth    Home Medications:  Allergies as of 11/14/2019      Reactions   Isosorbide Nitrate Other (See Comments)   Shellfish Allergy       Medication List       Accurate as of November 14, 2019 11:59 PM. If you have any questions, ask your nurse or doctor.        acetaminophen 500 MG tablet Commonly known as: TYLENOL Take 500-1,000 mg by mouth every 6 (six) hours as needed (for headache/minor pain.).   Adalimumab 40 MG/0.8ML Pnkt Inject 40 mg into the skin every 14 (fourteen) days. Thursdays   Advair Diskus 250-50 MCG/DOSE Aepb Generic drug: Fluticasone-Salmeterol Inhale 1 puff 2 (two) times daily into the lungs.   albuterol 108 (90 Base) MCG/ACT inhaler Commonly known as: VENTOLIN HFA Inhale 1-2 puffs into the lungs every 6 (six) hours as needed for wheezing or shortness of breath.   betamethasone dipropionate 0.05 % cream Apply 1 application topically 2 (two) times daily as needed (for psoriasis).   clobetasol ointment 0.05 % Commonly known as: TEMOVATE Apply topically twice daily as needed for psoriasis.   enalapril 5 MG tablet Commonly known as: VASOTEC Take 1 tablet (5 mg total) by mouth 2 (two) times daily.   ezetimibe 10 MG tablet Commonly known as: ZETIA Take 10 mg by mouth daily.   finasteride 5 MG tablet Commonly known as: PROSCAR TAKE 1 TABLET BY MOUTH  DAILY   isosorbide mononitrate 30 MG 24 hr tablet Commonly known as: IMDUR Take 30 mg by mouth daily.   metoprolol succinate 50 MG 24 hr tablet Commonly  known as: TOPROL-XL Take 50 mg by mouth daily. Take with or immediately following a meal.   omeprazole 20 MG capsule Commonly known as: PRILOSEC Take 20 mg by mouth daily.   PRESERVISION AREDS 2 PO Take 2 capsules by mouth daily.   tamsulosin  0.4 MG Caps capsule Commonly known as: FLOMAX TAKE 1 CAPSULE BY MOUTH  DAILY   TART CHERRY ADVANCED PO Take 12 mg by mouth daily.       Allergies:  Allergies  Allergen Reactions  . Isosorbide Nitrate Other (See Comments)  . Shellfish Allergy     Family History: Family History  Problem Relation Age of Onset  . Kidney failure Brother   . Prostate cancer Neg Hx     Social History:  reports that he quit smoking about 29 years ago. He has never used smokeless tobacco. He reports current alcohol use. He reports that he does not use drugs.  ROS: For pertinent review of systems please refer to history of present illness  Physical Exam: BP (!) 164/77   Pulse 73   Ht 5\' 7"  (1.702 m)   Wt 181 lb (82.1 kg)   BMI 28.35 kg/m   Constitutional:  Well nourished. Alert and oriented, No acute distress. HEENT: [place] AT, mask in place.  Trachea midline. Cardiovascular: No clubbing, cyanosis, or edema. Respiratory: Normal respiratory effort, no increased work of breathing. GI: Abdomen is soft, non tender, non distended, no abdominal masses.  GU: No CVA tenderness.  No bladder fullness or masses.  Patient with uncircumcised phallus.  Foreskin easily retracted.  Urethral meatus is patent.  No penile discharge. No penile lesions or rashes. Scrotum without lesions, cysts, rashes and/or edema.  Testicles are located scrotally bilaterally. No masses are appreciated in the testicles. Left and right epididymis are normal. Rectal: Patient with  normal sphincter tone. Anus and perineum without scarring or rashes. No rectal masses are appreciated. Prostate is approximately 50 grams, could only palpate the apex and the midportion of the gland, no nodules are  appreciated. Seminal vesicles could not be palpated.  Skin: No rashes, bruises or suspicious lesions. Lymph: No inguinal adenopathy. Neurologic: Grossly intact, no focal deficits, moving all 4 extremities. Psychiatric: Normal mood and affect.  Laboratory Data: See HPI    1. Elevated PSA Explained to the patient that a precipitous increase in his PSA has occurred.  He states he has been taking the finasteride daily, so this is concerning for prostate cancer.  Due to his age, I have recommended a prostate MRI explaining to the patient that we want to address only a high grade prostate cancer at this time and the prostate MRI will assist in stratifying his risk for a high grade prostate cancer.    2. BPH with LUTS IPSS score is 2/1, it is improved Continue conservative management, avoiding bladder irritants and timed voiding's Urinary symptoms not bothersome Continue tamsulosin 0.4 mg and finasteride 5 mg daily; refills not needed at this time RTC for prostate MRI results  Return for Prostate MRI report .  Zara Council, PA-C  [facility] [address] [place], [place] [address] [phone]

## 2019-11-20 ENCOUNTER — Telehealth: Payer: Self-pay | Admitting: Urology

## 2019-11-20 NOTE — Telephone Encounter (Signed)
I spoke with Brian Callahan and answered his questions.  He did inform me that he has a gold ring on one of his fingers that will not come off.

## 2019-11-20 NOTE — Telephone Encounter (Signed)
Pt states he was seen in clinic last Tues and had 2 rectal exams, pt states his exam and results of exam wasn't discussed, also a prostate MRI was ordered.  Pt also concerned about why the MRI is sched so far out, pt wants the MRI done sooner esp if something is of question. Please advise pt at [phone].

## 2019-11-20 NOTE — Unmapped (Signed)
DERMATOLOGY CLINIC NOTE    ASSESSMENT AND PLAN:     Psoriasis and psoriatic arthritis:  -We had a lengthy discussion about different options.  He is somewhat difficult as a historian to really pin down how much his psoriasis bothers him in terms of itching symptoms and also how much his joint symptoms are really related to psoriatic arthritis.  However, overall he does feel like he got benefit from being on the Humira previously.  We discussed that I think the Humira in terms of insurance coverage would be the best bet to get covered and therefore after discussion we will plan on restarting this.  I discussed with him that it is important for him to get a QuantiFERON test at [facility] to make sure he has not had any exposures to tuberculosis (although we discussed that he is low risk).  He otherwise tolerated the medication previously.  - HUMIRA PEN CITRATE FREE 40 MG/0.4 ML; Initial: 80 mg as a single dose.  Dispense: 2 each; Refill: 0  - HUMIRA PEN CITRATE FREE 40 MG/0.4 ML; Maintenance: 40 mg every other week beginning 1 week after initial dose.  Dispense: 2 kit; Refill: PRN  -Continue topical meantime.  clobetasoL (TEMOVATE) 0.05 % ointment; Apply topically twice daily as needed for psoriasis.  Dispense: 60 g; Refill: 5.  Discussed appropriate use.  -If not getting the benefit he would like at follow-up we will likely plan on increasing his Humira until weekly but will restart it every other week for now.    High risk medication use  - Quantiferon TB Gold Plus    Return to clinic:  3 months or sooner as needed.    CHIEF COMPLAINT:  Follow-up psoriasis     HPI:   This is a pleasant 79 y.o. male who last saw me on 02/01/19 for psoriasis and psoriatic arthritis. At that time he was flaring on his lower extremities but we did not change the medication since his joints were doing very well.  Per note, it looks like he is no longer on Humira.  There is a [facility] on 04/03/2019 which says approved but I am not sure if he had a large copayment.  The last QuantiFERON test we have in our computer is from 2017.    He returns today with his wife.  He has been off the Humira since February.  Discussed with him he believes this was helpful for both his skin psoriasis and his joint psoriasis.  He is a somewhat unclear historian.  No associated symptoms with this.    He was last seen by Dr. Sullivan Lone in rheumatology and at that time he felt like his psoriasis was pretty good, however he was on the Humira at the time.  At that time the plan was to continue Humira every 2 weeks.  He is unable to take methotrexate due to history of elevated liver enzymes.     PAST MEDICAL HISTORY:  Hypertension  Hyperlipidemia  Hyperglycemia  Psoriasis    Hx of psoriasis:  -H/o of psoriasis >??25 years, but flared in 2013 after a steroid injection for gout.   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 Not a candidate for MTX due to EtOH use and transaminitis??     MEDICATIONS:   Reviewed in epic.    ALLERGIES:   Isosorbide mononitrate  Shellfish containing products    SOCIAL HISTORY:  Accompanied by significant other, male  REVIEW OF SYSTEMS:  Baseline state of health. No recent illnesses. No other skin complaints.     PHYSICAL EXAMINATION:  Examination in the presence of male chaperone:  General: Well-developed, well-nourished. No acute distress.   Neuro: Alert and oriented, answers questions appropriately.  Skin: Examination of the scalp, face, head, neck, chest, back, upper extremities, lower extremities, hands, palms was performed and notable for the following:  Thin scaly psoriatic plaques on lower extremities and shins  Thin psoriatic plaques around umbilicus    Dictation software was used while making this note. Please excuse any errors made with dictation software.

## 2019-11-21 ENCOUNTER — Ambulatory Visit: Admit: 2019-11-21 | Discharge: 2019-11-22 | Payer: MEDICARE

## 2019-11-21 DIAGNOSIS — L409 Psoriasis, unspecified: Principal | ICD-10-CM

## 2019-11-21 DIAGNOSIS — Z79899 Other long term (current) drug therapy: Principal | ICD-10-CM

## 2019-11-21 DIAGNOSIS — L4050 Arthropathic psoriasis, unspecified: Principal | ICD-10-CM

## 2019-11-21 MED ORDER — HUMIRA PEN CITRATE FREE 40 MG/0.4 ML: each | 0 refills | 0 days | Status: AC

## 2019-11-21 MED ORDER — APREMILAST 10 MG (4)-20 MG (4)-30 MG (47) TABLETS IN A DOSE PACK
ORAL | 0 refills | 0 days | Status: CN
Start: 2019-11-21 — End: ?

## 2019-11-21 MED ORDER — HUMIRA PEN CITRATE FREE 40 MG/0.4 ML
SUBCUTANEOUS | 0 refills | 0.00000 days | Status: CP
Start: 2019-11-21 — End: ?

## 2019-11-21 MED ORDER — CLOBETASOL 0.05 % TOPICAL OINTMENT
5 refills | 0 days | Status: CP
Start: 2019-11-21 — End: ?

## 2019-11-21 NOTE — Unmapped (Signed)
Please get your TB test blood draw at [facility].

## 2019-11-22 DIAGNOSIS — L4050 Arthropathic psoriasis, unspecified: Principal | ICD-10-CM

## 2019-11-30 ENCOUNTER — Encounter: Payer: Self-pay | Admitting: Urology

## 2019-11-30 ENCOUNTER — Other Ambulatory Visit: Payer: Self-pay

## 2019-11-30 ENCOUNTER — Ambulatory Visit: Payer: Medicare Other | Admitting: Urology

## 2019-11-30 VITALS — BP 166/83 | HR 65 | Ht 67.0 in | Wt 178.0 lb

## 2019-11-30 DIAGNOSIS — L409 Psoriasis, unspecified: Principal | ICD-10-CM

## 2019-11-30 DIAGNOSIS — L4050 Arthropathic psoriasis, unspecified: Principal | ICD-10-CM

## 2019-11-30 DIAGNOSIS — N401 Enlarged prostate with lower urinary tract symptoms: Secondary | ICD-10-CM

## 2019-11-30 DIAGNOSIS — R972 Elevated prostate specific antigen [PSA]: Secondary | ICD-10-CM

## 2019-11-30 DIAGNOSIS — N138 Other obstructive and reflux uropathy: Secondary | ICD-10-CM | POA: Diagnosis not present

## 2019-11-30 LAB — URINALYSIS, COMPLETE
Bilirubin, UA: NEGATIVE
Glucose, UA: NEGATIVE
Ketones, UA: NEGATIVE
Leukocytes,UA: NEGATIVE
Nitrite, UA: NEGATIVE
Protein,UA: NEGATIVE
RBC, UA: NEGATIVE
Specific Gravity, UA: 1.015 (ref 1.005–1.030)
Urobilinogen, Ur: 0.2 mg/dL (ref 0.2–1.0)
pH, UA: 6.5 (ref 5.0–7.5)

## 2019-11-30 LAB — MICROSCOPIC EXAMINATION
Bacteria, UA: NONE SEEN
Epithelial Cells (non renal): NONE SEEN /hpf (ref 0–10)
RBC, Urine: NONE SEEN /hpf (ref 0–2)

## 2019-11-30 LAB — BLADDER SCAN AMB NON-IMAGING: Scan Result: 143

## 2019-11-30 MED ORDER — TAMSULOSIN HCL 0.4 MG PO CAPS: ORAL_CAPSULE | ORAL | 3 refills | Status: DC

## 2019-11-30 MED ORDER — HUMIRA PEN CITRATE FREE 40 MG/0.4 ML
SUBCUTANEOUS | PRN refills | 0.00000 days | Status: CP
Start: 2019-11-30 — End: ?

## 2019-11-30 MED ORDER — HUMIRA PEN CITRATE FREE 40 MG/0.4 ML: each | 0 refills | 0 days | Status: AC

## 2019-11-30 NOTE — Progress Notes (Signed)
08/18/2018 12:18 PM   Brian Callahan 01/03/1941 [id number]  Referring provider: Idelle Crouch, MD [facility] [place],  [name] [address]  Chief Complaint  Patient presents with  . Benign Prostatic Hypertrophy    HPI: Patient is 79 year old male with BPH and LUTS and elevated PSA presents today for difficulty with urination.    BPH WITH LUTS His IPSS score (11/14/2019)  was 2, which is mild lower urinary tract symptomatology.  He is pleased with his quality life due to his urinary symptoms.  His PVR was 259 mL.  His previous IPSS score was 9/1.  His previous PVR was 125 mL.   He is currently taking finasteride 5 mg and tamsulosin 0.4 mg daily.   History of elevated PSA He had a biopsy in 2013 for which was negative. He does not have a family history of PCa.  PSA Trend  bx in 2013 negative - unknown PSA level  4.66 in 2015 - started on finasteride  2.6 (5.2) ng/mL on 04/25/2014  1.6 (3.2) ng/mL on 11/22/2014  Component     Latest Ref Rng & Units 05/23/2015 11/18/2015 11/21/2015 03/10/2016  Prostate Specific Ag, Serum     0.0 - 4.0 ng/mL 1.8 2.3 2.3 2.0   Component     Latest Ref Rng & Units 11/19/2016 06/01/2017 12/30/2017 07/28/2018  Prostate Specific Ag, Serum     0.0 - 4.0 ng/mL 2.3 2.5 2.2 2.0   Component     Latest Ref Rng & Units 05/09/2019 11/07/2019  Prostate Specific Ag, Serum     0.0 - 4.0 ng/mL 2.8 4.1 (H)  Prostate MRI is pending  Today, he is having issues with feelings of incomplete emptying, not being able to urinate and straining to urinate.  These symptoms have been going on for the last few weeks.  He is anxious regarding his upcoming prostate MRI.  Patient denies any modifying or aggravating factors.  Patient denies any gross hematuria, dysuria or suprapubic/flank pain.  Patient denies any fevers, chills, nausea or vomiting.   UA negative.  PVR 143 mL.    PMH: Past Medical History:  Diagnosis Date  . Anemia   . BP (high  blood pressure) 05/23/2015  . BPH with obstruction/lower urinary tract symptoms   . COPD (chronic obstructive pulmonary disease) ([place])   . Dyspnea   . Encounter for long-term (current) use of other high-risk medications   . GERD (gastroesophageal reflux disease)   . GI bleed   . Gout   . H/O hernia repair mid-90s   x2  . History of elevated PSA   . [facility] of [facility] spotted fever   . HLD (hyperlipidemia)   . HTN (hypertension)   . Hyperglycemia   . Hyperuricemia   . Incomplete bladder emptying   . Obesity   . Psoriasis   . Psoriasis   . Urinary frequency     Surgical History: Past Surgical History:  Procedure Laterality Date  . APPENDECTOMY    . BACK SURGERY     neck and spine 2007  . CERVICAL SPINE SURGERY  2007  . CHOLECYSTECTOMY N/A 10/18/2017   Procedure: LAPAROSCOPIC CHOLECYSTECTOMY;  Surgeon: Brian Pun, MD;  Location: [facility] ORS;  Service: General;  Laterality: N/A;  . COLECTOMY  1998   partial, due to obstruction  . COLONOSCOPY     lost 10 units of blood  . COLONOSCOPY WITH PROPOFOL N/A 09/27/2019   Procedure: COLONOSCOPY WITH PROPOFOL;  Surgeon: Brian Callahan,  Brian Pike, MD;  Location: [facility] ENDOSCOPY;  Service: Gastroenterology;  Laterality: N/A;  . ESOPHAGOGASTRODUODENOSCOPY N/A 10/14/2016   Procedure: ESOPHAGOGASTRODUODENOSCOPY (EGD);  Surgeon: Brian Silvas, MD;  Location: [facility] ENDOSCOPY;  Service: Endoscopy;  Laterality: N/A;  . ESOPHAGOGASTRODUODENOSCOPY (EGD) WITH PROPOFOL N/A 09/27/2019   Procedure: ESOPHAGOGASTRODUODENOSCOPY (EGD) WITH PROPOFOL;  Surgeon: Brian Callahan, Brian Pike, MD;  Location: [facility] ENDOSCOPY;  Service: Gastroenterology;  Laterality: N/A;  . EYE SURGERY     bilateral cataracts  . HERNIA REPAIR  mid-90s   x2  inguinal  . PROSTATE BIOPSY  2013  . SURGERY SCROTAL / TESTICULAR     for fibrous growth    Home Medications:  Allergies as of 11/30/2019      Reactions   Isosorbide Nitrate Other (See Comments)   Shellfish Allergy         Medication List       Accurate as of Nov 30, 2019 12:18 PM. If you have any questions, ask your nurse or doctor.        acetaminophen 500 MG tablet Commonly known as: TYLENOL Take 500-1,000 mg by mouth every 6 (six) hours as needed (for headache/minor pain.).   Adalimumab 40 MG/0.8ML Pnkt Inject 40 mg into the skin every 14 (fourteen) days. Thursdays   Advair Diskus 250-50 MCG/DOSE Aepb Generic drug: Fluticasone-Salmeterol Inhale 1 puff 2 (two) times daily into the lungs.   albuterol 108 (90 Base) MCG/ACT inhaler Commonly known as: VENTOLIN HFA Inhale 1-2 puffs into the lungs every 6 (six) hours as needed for wheezing or shortness of breath.   betamethasone dipropionate 0.05 % cream Apply 1 application topically 2 (two) times daily as needed (for psoriasis).   clobetasol ointment 0.05 % Commonly known as: TEMOVATE Apply topically twice daily as needed for psoriasis.   enalapril 5 MG tablet Commonly known as: VASOTEC Take 1 tablet (5 mg total) by mouth 2 (two) times daily.   ezetimibe 10 MG tablet Commonly known as: ZETIA Take 10 mg by mouth daily.   finasteride 5 MG tablet Commonly known as: PROSCAR TAKE 1 TABLET BY MOUTH  DAILY   isosorbide mononitrate 30 MG 24 hr tablet Commonly known as: IMDUR Take 30 mg by mouth daily.   metoprolol succinate 50 MG 24 hr tablet Commonly known as: TOPROL-XL Take 50 mg by mouth daily. Take with or immediately following a meal.   omeprazole 20 MG capsule Commonly known as: PRILOSEC Take 20 mg by mouth daily.   PRESERVISION AREDS 2 PO Take 2 capsules by mouth daily.   tamsulosin 0.4 MG Caps capsule Commonly known as: FLOMAX TAKE 1 CAPSULE BY MOUTH  TWICE DAILY What changed: additional instructions Changed by: Brian Mehan, PA-C   TART CHERRY ADVANCED PO Take 12 mg by mouth daily.       Allergies:  Allergies  Allergen Reactions  . Isosorbide Nitrate Other (See Comments)  . Shellfish Allergy     Family  History: Family History  Problem Relation Age of Onset  . Kidney failure Brother   . Prostate cancer Neg Hx     Social History:  reports that he quit smoking about 29 years ago. He has never used smokeless tobacco. He reports current alcohol use. He reports that he does not use drugs.  ROS: For pertinent review of systems please refer to history of present illness  Physical Exam: BP (!) 166/83   Pulse 65   Ht 5\' 7"  (1.702 m)   Wt 178 lb (80.7 kg)   BMI 27.88  kg/m   Constitutional:  Well nourished. Alert and oriented, No acute distress. HEENT: [place] AT, mask in place.  Trachea midline. Cardiovascular: No clubbing, cyanosis, or edema. Respiratory: Normal respiratory effort, no increased work of breathing. Neurologic: Grossly intact, no focal deficits, moving all 4 extremities. Psychiatric: Normal mood and affect.  Laboratory Data: See HPI for PSA Urinalysis Component     Latest Ref Rng & Units 11/30/2019  Specific Gravity, UA     1.005 - 1.030 1.015  pH, UA     5.0 - 7.5 6.5  Color, UA     Yellow Yellow  Appearance Ur     Clear Clear  Leukocytes,UA     Negative Negative  Protein,UA     Negative/Trace Negative  Glucose, UA     Negative Negative  Ketones, UA     Negative Negative  RBC, UA     Negative Negative  Bilirubin, UA     Negative Negative  Urobilinogen, Ur     0.2 - 1.0 mg/dL 0.2  Nitrite, UA     Negative Negative  Microscopic Examination      See below:   Component     Latest Ref Rng & Units 11/30/2019  WBC, UA     0 - 5 /hpf 0-5  RBC     0 - 2 /hpf None seen  Epithelial Cells (non renal)     0 - 10 /hpf None seen  Bacteria, UA     None seen/Few None seen   I have reviewed the labs.    1. Elevated PSA Prostate MRI pending   2. BPH with LUTS Will increase the tamsulosin to 0.4 mg twice daily Explained to the patient that there is nothing urgent at this time as his urine is benign and his PVR's have been stable.  It is likely that his prostate  enlargement or prostate cancer is causing these symptoms.  We will need to wait on the MRI results.  We did attempt to move his prostate MRI appointment up, but there was no availability.     Return for Keep scheduled follow ups .  Zara Council, PA-C  [facility] [address] [place], [place] [address] [phone]

## 2019-12-03 LAB — CULTURE, URINE COMPREHENSIVE

## 2019-12-12 ENCOUNTER — Other Ambulatory Visit: Payer: Self-pay

## 2019-12-12 ENCOUNTER — Ambulatory Visit
Admission: RE | Admit: 2019-12-12 | Discharge: 2019-12-12 | Disposition: A | Discharge status: Home / Self Care | Payer: Medicare Other | Source: Ambulatory Visit | Attending: Urology | Admitting: Urology

## 2019-12-12 DIAGNOSIS — R972 Elevated prostate specific antigen [PSA]: Secondary | ICD-10-CM | POA: Diagnosis not present

## 2019-12-12 MED ORDER — GADOBUTROL 1 MMOL/ML IV SOLN
7.5000 mL | Freq: Once | INTRAVENOUS | Status: AC | PRN
  Administered 2019-12-12: 7.5 mL via INTRAVENOUS

## 2019-12-15 ENCOUNTER — Encounter: Payer: Self-pay | Admitting: Urology

## 2019-12-15 ENCOUNTER — Other Ambulatory Visit: Payer: Self-pay

## 2019-12-15 ENCOUNTER — Ambulatory Visit: Payer: Medicare Other | Admitting: Urology

## 2019-12-15 VITALS — BP 157/75 | HR 82 | Ht 67.0 in | Wt 181.0 lb

## 2019-12-15 DIAGNOSIS — N401 Enlarged prostate with lower urinary tract symptoms: Secondary | ICD-10-CM

## 2019-12-15 DIAGNOSIS — R972 Elevated prostate specific antigen [PSA]: Secondary | ICD-10-CM | POA: Diagnosis not present

## 2019-12-15 DIAGNOSIS — N138 Other obstructive and reflux uropathy: Secondary | ICD-10-CM

## 2019-12-15 LAB — BLADDER SCAN AMB NON-IMAGING: Scan Result: 249

## 2019-12-15 MED ORDER — DIAZEPAM 10 MG PO TABS: ORAL_TABLET | ORAL | 0 refills | Status: DC

## 2019-12-15 NOTE — Progress Notes (Signed)
08/18/2018 11:22 AM   [name] 1940-09-16 [id number]  Referring provider: Idelle Crouch, MD [place] [facility] [place],  [place] [address]  Chief Complaint  Patient presents with  . Benign Prostatic Hypertrophy    HPI: Patient is 79 year old male with BPH and LUTS and elevated PSA presents today for prostate MRI results and symptom recheck.    BPH WITH LUTS His IPSS score (11/14/2019)  was 2, which is mild lower urinary tract symptomatology.  He is pleased with his quality life due to his urinary symptoms.  His PVR was 259 mL.  His previous IPSS score was 9/1.  His previous PVR was 125 mL.   He is currently taking finasteride 5 mg and tamsulosin 0.4 mg daily.   History of elevated PSA He had a biopsy in 2013 for which was negative. He does not have a family history of PCa.  PSA Trend  bx in 2013 negative - unknown PSA level  4.66 in 2015 - started on finasteride  2.6 (5.2) ng/mL on 04/25/2014  1.6 (3.2) ng/mL on 11/22/2014  Component     Latest Ref Rng & Units 05/23/2015 11/18/2015 11/21/2015 03/10/2016  Prostate Specific Ag, Serum     0.0 - 4.0 ng/mL 1.8 2.3 2.3 2.0   Component     Latest Ref Rng & Units 11/19/2016 06/01/2017 12/30/2017 07/28/2018  Prostate Specific Ag, Serum     0.0 - 4.0 ng/mL 2.3 2.5 2.2 2.0   Component     Latest Ref Rng & Units 05/09/2019 11/07/2019  Prostate Specific Ag, Serum     0.0 - 4.0 ng/mL 2.8 4.1 (H)   Prostate MRI on 12/12/2019 No findings suspicious for high-grade macroscopic prostate cancer.  PI-RADS 1.  Suspected BPH.  Calculated prostate volume 33 mL.   PMH: Past Medical History:  Diagnosis Date  . Anemia   . BP (high blood pressure) 05/23/2015  . BPH with obstruction/lower urinary tract symptoms   . COPD (chronic obstructive pulmonary disease) ([place])   . Dyspnea   . Encounter for long-term (current) use of other high-risk medications   . GERD (gastroesophageal reflux disease)   . GI bleed   . Gout   .  H/O hernia repair mid-90s   x2  . History of elevated PSA   . History of [facility] spotted fever   . HLD (hyperlipidemia)   . HTN (hypertension)   . Hyperglycemia   . Hyperuricemia   . Incomplete bladder emptying   . Obesity   . Psoriasis   . Psoriasis   . Urinary frequency     Surgical History: Past Surgical History:  Procedure Laterality Date  . APPENDECTOMY    . BACK SURGERY     neck and spine 2007  . CERVICAL SPINE SURGERY  2007  . CHOLECYSTECTOMY N/A 10/18/2017   Procedure: LAPAROSCOPIC CHOLECYSTECTOMY;  Surgeon: [name], MD;  Location: [facility] ORS;  Service: General;  Laterality: N/A;  . COLECTOMY  1998   partial, due to obstruction  . COLONOSCOPY     lost 10 units of blood  . COLONOSCOPY WITH PROPOFOL N/A 09/27/2019   Procedure: COLONOSCOPY WITH PROPOFOL;  Surgeon: [name], [name], MD;  Location: [facility] ENDOSCOPY;  Service: Gastroenterology;  Laterality: N/A;  . ESOPHAGOGASTRODUODENOSCOPY N/A 10/14/2016   Procedure: ESOPHAGOGASTRODUODENOSCOPY (EGD);  Surgeon: [name], MD;  Location: [facility] ENDOSCOPY;  Service: Endoscopy;  Laterality: N/A;  . ESOPHAGOGASTRODUODENOSCOPY (EGD) WITH PROPOFOL N/A 09/27/2019   Procedure: ESOPHAGOGASTRODUODENOSCOPY (EGD) WITH  PROPOFOL;  Surgeon: [name], [name], MD;  Location: [facility] ENDOSCOPY;  Service: Gastroenterology;  Laterality: N/A;  . EYE SURGERY     bilateral cataracts  . HERNIA REPAIR  mid-90s   x2  inguinal  . PROSTATE BIOPSY  2013  . SURGERY SCROTAL / TESTICULAR     for fibrous growth    Home Medications:  Allergies as of 12/15/2019      Reactions   Isosorbide Nitrate Other (See Comments)   Shellfish Allergy       Medication List       Accurate as of Dec 15, 2019 11:22 AM. If you have any questions, ask your nurse or doctor.        acetaminophen 500 MG tablet Commonly known as: TYLENOL Take 500-1,000 mg by mouth every 6 (six) hours as needed (for headache/minor pain.).   Adalimumab 40 MG/0.8ML  Pnkt Inject 40 mg into the skin every 14 (fourteen) days. Thursdays   Advair Diskus 250-50 MCG/DOSE Aepb Generic drug: Fluticasone-Salmeterol Inhale 1 puff 2 (two) times daily into the lungs.   albuterol 108 (90 Base) MCG/ACT inhaler Commonly known as: VENTOLIN HFA Inhale 1-2 puffs into the lungs every 6 (six) hours as needed for wheezing or shortness of breath.   betamethasone dipropionate 0.05 % cream Apply 1 application topically 2 (two) times daily as needed (for psoriasis).   clobetasol ointment 0.05 % Commonly known as: TEMOVATE Apply topically twice daily as needed for psoriasis.   enalapril 5 MG tablet Commonly known as: VASOTEC Take 1 tablet (5 mg total) by mouth 2 (two) times daily.   ezetimibe 10 MG tablet Commonly known as: ZETIA Take 10 mg by mouth daily.   finasteride 5 MG tablet Commonly known as: PROSCAR TAKE 1 TABLET BY MOUTH  DAILY   isosorbide mononitrate 30 MG 24 hr tablet Commonly known as: IMDUR Take 30 mg by mouth daily.   metoprolol succinate 50 MG 24 hr tablet Commonly known as: TOPROL-XL Take 50 mg by mouth daily. Take with or immediately following a meal.   omeprazole 20 MG capsule Commonly known as: PRILOSEC Take 20 mg by mouth daily.   PRESERVISION AREDS 2 PO Take 2 capsules by mouth daily.   tamsulosin 0.4 MG Caps capsule Commonly known as: FLOMAX TAKE 1 CAPSULE BY MOUTH  TWICE DAILY   TART CHERRY ADVANCED PO Take 12 mg by mouth daily.       Allergies:  Allergies  Allergen Reactions  . Isosorbide Nitrate Other (See Comments)  . Shellfish Allergy     Family History: Family History  Problem Relation Age of Onset  . Kidney failure Brother   . Prostate cancer Neg Hx     Social History:  reports that he quit smoking about 29 years ago. He has never used smokeless tobacco. He reports current alcohol use. He reports that he does not use drugs.  ROS: For pertinent review of systems please refer to history of present  illness  Physical Exam: BP (!) 157/75   Pulse 82   Ht 5\' 7"  (1.702 m)   Wt 181 lb (82.1 kg)   BMI 28.35 kg/m   Constitutional:  Well nourished. Alert and oriented, No acute distress. HEENT: [place] AT, mask in place.  Trachea midline. Cardiovascular: No clubbing, cyanosis, or edema. Respiratory: Normal respiratory effort, no increased work of breathing. Neurologic: Grossly intact, no focal deficits, moving all 4 extremities. Psychiatric: Normal mood and affect.  Laboratory Data: See HPI for PSA Urinalysis Component  Latest Ref Rng & Units 11/30/2019  Specific Gravity, UA     1.005 - 1.030 1.015  pH, UA     5.0 - 7.5 6.5  Color, UA     Yellow Yellow  Appearance Ur     Clear Clear  Leukocytes,UA     Negative Negative  Protein,UA     Negative/Trace Negative  Glucose, UA     Negative Negative  Ketones, UA     Negative Negative  RBC, UA     Negative Negative  Bilirubin, UA     Negative Negative  Urobilinogen, Ur     0.2 - 1.0 mg/dL 0.2  Nitrite, UA     Negative Negative  Microscopic Examination      See below:   Component     Latest Ref Rng & Units 11/30/2019  WBC, UA     0 - 5 /hpf 0-5  RBC     0 - 2 /hpf None seen  Epithelial Cells (non renal)     0 - 10 /hpf None seen  Bacteria, UA     None seen/Few None seen   I have reviewed the labs.    1. Elevated PSA Prostate MRI 11/2019 did not identify any findings suspicious for high grade prostate cancer.  I discussed with the patient the approximately 15-20% false negative rate on MRI and he is comfortable with this as he does not want to undergo another prostate biopsy as he found it extremely painful  2. BPH with LUTS Patient still will significant voiding symptoms on maximum medical therapy Discussed undergoing cystoscopy to evaluate for bladder outlet obstruction I have explained to the patient that they will  be scheduled for a cystoscopy in our office to evaluate their bladder.  The cystoscopy consists of  passing a tube with a lens up through their urethra and into their urinary bladder.   We will inject the urethra with a lidocaine gel prior to introducing the cystoscope to help with any discomfort during the procedure.   After the procedure, they might experience blood in the urine and discomfort with urination.  This will abate after the first few voids.  I have  encouraged the patient to increase water intake  during this time.  Patient denies any allergies to lidocaine.  He requested the procedure under general anesthesia, but I explained the risk of undergoing anesthesia are not warranted for a quick procedure I have given him a prescription for Valium 10 mg to take 30 minutes prior to the procedure and stated he needed a driver   Return for cystoscopy for BOO .  Zara Council, PA-C  [facility] [address] [place], [place] [address] [phone]

## 2019-12-27 ENCOUNTER — Encounter: Payer: Self-pay | Admitting: Urology

## 2019-12-27 ENCOUNTER — Ambulatory Visit: Payer: Medicare Other | Admitting: Urology

## 2019-12-27 ENCOUNTER — Other Ambulatory Visit: Payer: Self-pay

## 2019-12-27 VITALS — BP 146/71 | HR 75 | Ht 67.0 in | Wt 181.0 lb

## 2019-12-27 DIAGNOSIS — N400 Enlarged prostate without lower urinary tract symptoms: Secondary | ICD-10-CM | POA: Diagnosis not present

## 2019-12-27 LAB — URINALYSIS, COMPLETE
Bilirubin, UA: NEGATIVE
Glucose, UA: NEGATIVE
Ketones, UA: NEGATIVE
Leukocytes,UA: NEGATIVE
Nitrite, UA: NEGATIVE
Protein,UA: NEGATIVE
RBC, UA: NEGATIVE
Specific Gravity, UA: 1.015 (ref 1.005–1.030)
Urobilinogen, Ur: 0.2 mg/dL (ref 0.2–1.0)
pH, UA: 7 (ref 5.0–7.5)

## 2019-12-27 LAB — MICROSCOPIC EXAMINATION
Bacteria, UA: NONE SEEN
Epithelial Cells (non renal): NONE SEEN /hpf (ref 0–10)
RBC, Urine: NONE SEEN /hpf (ref 0–2)
WBC, UA: NONE SEEN /hpf (ref 0–5)

## 2019-12-27 MED ORDER — LIDOCAINE HCL URETHRAL/MUCOSAL 2 % EX GEL
1.0000 "application " | Freq: Once | CUTANEOUS | Status: AC
  Administered 2019-12-27: 1 via URETHRAL

## 2019-12-27 NOTE — Progress Notes (Signed)
Cystoscopy Procedure Note:  Indication: BPH and urinary symptoms  79 year old male who has been followed by [facility] long-term for BPH and urinary symptoms.  His primary complaint is urinary frequency every hour during the day and weak stream, and his PVRs have been elevated in the past up to 250 mL.  He denies any incontinence.  He is on maximal medical therapy with Flomax and finasteride.  After informed consent and discussion of the procedure and its risks, Brian Callahan was positioned and prepped in the standard fashion. Cystoscopy was performed with a flexible cystoscope. The urethra, bladder neck and entire bladder was visualized in a standard fashion. The prostate was small with obstructing lateral lobes and a high bladder neck.  There was no median lobe.  The ureteral orifices were visualized in their normal location and orientation.  Moderate bladder trabeculations, no suspicious lesions, retroflexion with no significant intravesical protrusion.  Imaging: Prostate MRI 12/12/2019 with volume 33g, no suspicious lesions  Findings: Normal urethra, small prostate with high bladder neck, moderate bladder trabeculations, no suspicious bladder lesions  Assessment and Plan: I recommended he meet with Dr. Bernardo Heater to consider UroLift for his persistent urinary symptoms  Brian Madrid, MD 12/27/2019

## 2020-01-10 ENCOUNTER — Telehealth: Payer: Self-pay | Admitting: Cardiovascular Disease

## 2020-01-10 ENCOUNTER — Encounter: Payer: Self-pay | Admitting: Urology

## 2020-01-10 ENCOUNTER — Other Ambulatory Visit: Payer: Self-pay

## 2020-01-10 ENCOUNTER — Ambulatory Visit: Payer: Medicare Other | Admitting: Urology

## 2020-01-10 ENCOUNTER — Other Ambulatory Visit: Payer: Self-pay | Admitting: Radiology

## 2020-01-10 VITALS — BP 118/88 | HR 85 | Ht 66.0 in | Wt 182.0 lb

## 2020-01-10 DIAGNOSIS — N401 Enlarged prostate with lower urinary tract symptoms: Secondary | ICD-10-CM

## 2020-01-10 NOTE — Progress Notes (Signed)
12/26/19 10:48 AM   Brian Callahan 05-10-1941 [id number]  Referring provider: Idelle Crouch, MD [facility] [place],  [place] [address] Chief Complaint  Patient presents with  . Other   Urologic History:  BPH WITH LUTS His IPSS score (11/14/2019)  was 2, which is mild lower urinary tract symptomatology.  He is pleased with his quality life due to his urinary symptoms.  His PVR was 259 mL.  His previous IPSS score was 9/1.  His previous PVR was 125 mL.   He is currently taking finasteride 5 mg and tamsulosin 0.4 mg daily.   History of elevated PSA He had a biopsy in 2013 for which was negative. He does not have a family history of PCa.  PSA Trend             bx in 2013 negative - unknown PSA level             4.66 in 2015 - started on finasteride             2.6 (5.2) ng/mL on 04/25/2014             1.6 (3.2) ng/mL on 04/28/  HPI: Brian Callahan is a 79 y.o. male who presents today to discuss a urolift.  -Bothersome LUTS on tamsulosin 0.8 mg/finasteride -Most bothersome symptoms freqency and intermittent urinary stream -No improvement in symptoms with double dose tamsulosin and also om finasteride -Cystoscopy recently performed by Dr. Diamantina Providence remarkable for lateral lobe enlargement and elevated bladder neck without median lobe -Prostate volume by MRI 33 g -Interested in pursuing UroLift  PMH: Past Medical History:  Diagnosis Date  . Anemia   . BP (high blood pressure) 05/23/2015  . BPH with obstruction/lower urinary tract symptoms   . COPD (chronic obstructive pulmonary disease) ([place])   . Dyspnea   . Encounter for long-term (current) use of other high-risk medications   . GERD (gastroesophageal reflux disease)   . GI bleed   . Gout   . H/O hernia repair mid-90s   x2  . History of elevated PSA   . History of [facility] spotted fever   . HLD (hyperlipidemia)   . HTN (hypertension)   . Hyperglycemia   . Hyperuricemia   .  Incomplete bladder emptying   . Obesity   . Psoriasis   . Psoriasis   . Urinary frequency     Surgical History: Past Surgical History:  Procedure Laterality Date  . APPENDECTOMY    . BACK SURGERY     neck and spine 2007  . CERVICAL SPINE SURGERY  2007  . CHOLECYSTECTOMY N/A 10/18/2017   Procedure: LAPAROSCOPIC CHOLECYSTECTOMY;  Surgeon: [name], MD;  Location: [facility] ORS;  Service: General;  Laterality: N/A;  . COLECTOMY  1998   partial, due to obstruction  . COLONOSCOPY     lost 10 units of blood  . COLONOSCOPY WITH PROPOFOL N/A 09/27/2019   Procedure: COLONOSCOPY WITH PROPOFOL;  Surgeon: [name], [name], MD;  Location: [facility] ENDOSCOPY;  Service: Gastroenterology;  Laterality: N/A;  . ESOPHAGOGASTRODUODENOSCOPY N/A 10/14/2016   Procedure: ESOPHAGOGASTRODUODENOSCOPY (EGD);  Surgeon: [name], MD;  Location: [facility] ENDOSCOPY;  Service: Endoscopy;  Laterality: N/A;  . ESOPHAGOGASTRODUODENOSCOPY (EGD) WITH PROPOFOL N/A 09/27/2019   Procedure: ESOPHAGOGASTRODUODENOSCOPY (EGD) WITH PROPOFOL;  Surgeon: [name], [name], MD;  Location: [facility] ENDOSCOPY;  Service: Gastroenterology;  Laterality: N/A;  . EYE SURGERY     bilateral cataracts  . HERNIA REPAIR  mid-90s   x2  inguinal  . PROSTATE BIOPSY  2013  . SURGERY SCROTAL / TESTICULAR     for fibrous growth    Home Medications:  Allergies as of 01/10/2020      Reactions   Isosorbide Nitrate Other (See Comments)   Shellfish Allergy       Medication List       Accurate as of January 10, 2020 10:48 AM. If you have any questions, ask your nurse or doctor.        acetaminophen 500 MG tablet Commonly known as: TYLENOL Take 500-1,000 mg by mouth every 6 (six) hours as needed (for headache/minor pain.).   Advair Diskus 250-50 MCG/DOSE Aepb Generic drug: Fluticasone-Salmeterol Inhale 1 puff 2 (two) times daily into the lungs.   betamethasone dipropionate 0.05 % cream Apply 1 application topically 2 (two) times daily as  needed (for psoriasis).   clobetasol ointment 0.05 % Commonly known as: TEMOVATE Apply topically twice daily as needed for psoriasis.   diazepam 10 MG tablet Commonly known as: Valium Take one tablet 30 minutes prior to procedure.  Need to have a driver.   enalapril 5 MG tablet Commonly known as: VASOTEC Take 1 tablet (5 mg total) by mouth 2 (two) times daily.   ezetimibe 10 MG tablet Commonly known as: ZETIA Take 10 mg by mouth daily.   finasteride 5 MG tablet Commonly known as: PROSCAR TAKE 1 TABLET BY MOUTH  DAILY   isosorbide mononitrate 30 MG 24 hr tablet Commonly known as: IMDUR Take 30 mg by mouth daily.   metoprolol succinate 50 MG 24 hr tablet Commonly known as: TOPROL-XL Take 50 mg by mouth daily. Take with or immediately following a meal.   omeprazole 20 MG capsule Commonly known as: PRILOSEC Take 20 mg by mouth daily.   PRESERVISION AREDS 2 PO Take 2 capsules by mouth daily.   tamsulosin 0.4 MG Caps capsule Commonly known as: FLOMAX TAKE 1 CAPSULE BY MOUTH  TWICE DAILY   TART CHERRY ADVANCED PO Take 12 mg by mouth daily.       Allergies:  Allergies  Allergen Reactions  . Isosorbide Nitrate Other (See Comments)  . Shellfish Allergy     Family History: Family History  Problem Relation Age of Onset  . Kidney failure Brother   . Prostate cancer Neg Hx     Social History:  reports that he quit smoking about 29 years ago. He has never used smokeless tobacco. He reports current alcohol use. He reports that he does not use drugs.   Physical Exam: BP 118/88   Pulse 85   Ht 5\' 6"  (1.676 m)   Wt 182 lb (82.6 kg)   BMI 29.38 kg/m   Constitutional:  Alert and oriented, No acute distress. HEENT: [place] AT, moist mucus membranes.  Trachea midline, no masses. Cardiovascular: No clubbing, cyanosis, or edema.  RRR Respiratory: Normal respiratory effort, no increased work of breathing.  Clear Skin: No rashes, bruises or suspicious lesions. Neurologic:  Grossly intact, no focal deficits, moving all 4 extremities. Psychiatric: Normal mood and affect.   Assessment & Plan:   1. BPH with LUTS -Surgical options were again reviewed including TURP, PVP and minimally invasive options including UroLift.  He is most interested in [place].  The procedure was discussed in detail.  It was stressed there is no guarantee this will improve his symptoms though symptoms do improve and the majority of patients having the procedure.  The most common postoperative side  effects of frequency, urgency, dysuria and hematuria were discussed.  The postoperative care and follow-up was discussed.  The low incidence of sexual side effects were also reviewed.  -He desires to schedule a [place] [address], [place],  [address] [phone]  I, Joneen Boers Peace, am acting as a Education administrator for Dr. Nicki Reaper C. Burech Mcfarland.  I have reviewed the above documentation for accuracy and completeness, and I agree with the above.   [name], MD

## 2020-01-10 NOTE — H&P (View-Only) (Signed)
12/26/19 10:48 AM   [name] [birth year] [id number]  Referring provider: Idelle Crouch, MD [facility] [place],  [place] [address] Chief Complaint  Patient presents with  . Other   Urologic History:  BPH WITH LUTS His IPSS score (11/14/2019)  was 2, which is mild lower urinary tract symptomatology.  He is pleased with his quality life due to his urinary symptoms.  His PVR was 259 mL.  His previous IPSS score was 9/1.  His previous PVR was 125 mL.   He is currently taking finasteride 5 mg and tamsulosin 0.4 mg daily.   History of elevated PSA He had a biopsy in 2013 for which was negative. He does not have a family history of PCa.  PSA Trend             bx in 2013 negative - unknown PSA level             4.66 in 2015 - started on finasteride             2.6 (5.2) ng/mL on 04/25/2014             1.6 (3.2) ng/mL on 04/28/  HPI: Brian Callahan is a 79 y.o. male who presents today to discuss a urolift.  -Bothersome LUTS on tamsulosin 0.8 mg/finasteride -Most bothersome symptoms freqency and intermittent urinary stream -No improvement in symptoms with double dose tamsulosin and also om finasteride -Cystoscopy recently performed by Dr. Diamantina [facility] remarkable for lateral lobe enlargement and elevated bladder neck without median lobe -Prostate volume by MRI 33 g -Interested in pursuing UroLift  PMH: Past Medical History:  Diagnosis Date  . Anemia   . BP (high blood pressure) 05/23/2015  . BPH with obstruction/lower urinary tract symptoms   . COPD (chronic obstructive pulmonary disease) ([place])   . Dyspnea   . Encounter for long-term (current) use of other high-risk medications   . GERD (gastroesophageal reflux disease)   . GI bleed   . Gout   . H/O hernia repair mid-90s   x2  . History of elevated PSA   . History of [facility] spotted fever   . HLD (hyperlipidemia)   . HTN (hypertension)   . Hyperglycemia   . Hyperuricemia   .  Incomplete bladder emptying   . Obesity   . Psoriasis   . Psoriasis   . Urinary frequency     Surgical History: Past Surgical History:  Procedure Laterality Date  . APPENDECTOMY    . BACK SURGERY     neck and spine 2007  . CERVICAL SPINE SURGERY  2007  . CHOLECYSTECTOMY N/A 10/18/2017   Procedure: LAPAROSCOPIC CHOLECYSTECTOMY;  Surgeon: [name], MD;  Location: [facility] ORS;  Service: General;  Laterality: N/A;  . COLECTOMY  1998   partial, due to obstruction  . COLONOSCOPY     lost 10 units of blood  . COLONOSCOPY WITH PROPOFOL N/A 09/27/2019   Procedure: COLONOSCOPY WITH PROPOFOL;  Surgeon: [name], [name], MD;  Location: [facility] ENDOSCOPY;  Service: Gastroenterology;  Laterality: N/A;  . ESOPHAGOGASTRODUODENOSCOPY N/A 10/14/2016   Procedure: ESOPHAGOGASTRODUODENOSCOPY (EGD);  Surgeon: [name], MD;  Location: [facility] ENDOSCOPY;  Service: Endoscopy;  Laterality: N/A;  . ESOPHAGOGASTRODUODENOSCOPY (EGD) WITH PROPOFOL N/A 09/27/2019   Procedure: ESOPHAGOGASTRODUODENOSCOPY (EGD) WITH PROPOFOL;  Surgeon: [name], [name], MD;  Location: [facility] ENDOSCOPY;  Service: Gastroenterology;  Laterality: N/A;  . EYE SURGERY     bilateral cataracts  . HERNIA REPAIR  mid-90s   x2  inguinal  . PROSTATE BIOPSY  2013  . SURGERY SCROTAL / TESTICULAR     for fibrous growth    Home Medications:  Allergies as of 01/10/2020      Reactions   Isosorbide Nitrate Other (See Comments)   Shellfish Allergy       Medication List       Accurate as of January 10, 2020 10:48 AM. If you have any questions, ask your nurse or doctor.        acetaminophen 500 MG tablet Commonly known as: TYLENOL Take 500-1,000 mg by mouth every 6 (six) hours as needed (for headache/minor pain.).   Advair Diskus 250-50 MCG/DOSE Aepb Generic drug: Fluticasone-Salmeterol Inhale 1 puff 2 (two) times daily into the lungs.   betamethasone dipropionate 0.05 % cream Apply 1 application topically 2 (two) times daily as  needed (for psoriasis).   clobetasol ointment 0.05 % Commonly known as: TEMOVATE Apply topically twice daily as needed for psoriasis.   diazepam 10 MG tablet Commonly known as: Valium Take one tablet 30 minutes prior to procedure.  Need to have a driver.   enalapril 5 MG tablet Commonly known as: VASOTEC Take 1 tablet (5 mg total) by mouth 2 (two) times daily.   ezetimibe 10 MG tablet Commonly known as: ZETIA Take 10 mg by mouth daily.   finasteride 5 MG tablet Commonly known as: PROSCAR TAKE 1 TABLET BY MOUTH  DAILY   isosorbide mononitrate 30 MG 24 hr tablet Commonly known as: IMDUR Take 30 mg by mouth daily.   metoprolol succinate 50 MG 24 hr tablet Commonly known as: TOPROL-XL Take 50 mg by mouth daily. Take with or immediately following a meal.   omeprazole 20 MG capsule Commonly known as: PRILOSEC Take 20 mg by mouth daily.   PRESERVISION AREDS 2 PO Take 2 capsules by mouth daily.   tamsulosin 0.4 MG Caps capsule Commonly known as: FLOMAX TAKE 1 CAPSULE BY MOUTH  TWICE DAILY   TART CHERRY ADVANCED PO Take 12 mg by mouth daily.       Allergies:  Allergies  Allergen Reactions  . Isosorbide Nitrate Other (See Comments)  . Shellfish Allergy     Family History: Family History  Problem Relation Age of Onset  . Kidney failure Brother   . Prostate cancer Neg Hx     Social History:  reports that he quit smoking about 29 years ago. He has never used smokeless tobacco. He reports current alcohol use. He reports that he does not use drugs.   Physical Exam: BP 118/88   Pulse 85   Ht 5\' 6"  (1.676 m)   Wt 182 lb (82.6 kg)   BMI 29.38 kg/m   Constitutional:  Alert and oriented, No acute distress. HEENT: [place] AT, moist mucus membranes.  Trachea midline, no masses. Cardiovascular: No clubbing, cyanosis, or edema.  RRR Respiratory: Normal respiratory effort, no increased work of breathing.  Clear Skin: No rashes, bruises or suspicious lesions. Neurologic:  Grossly intact, no focal deficits, moving all 4 extremities. Psychiatric: Normal mood and affect.   Assessment & Plan:   1. BPH with LUTS -Surgical options were again reviewed including TURP, PVP and minimally invasive options including UroLift.  He is most interested in [place].  The procedure was discussed in detail.  It was stressed there is no guarantee this will improve his symptoms though symptoms do improve and the majority of patients having the procedure.  The most common postoperative side  effects of frequency, urgency, dysuria and hematuria were discussed.  The postoperative care and follow-up was discussed.  The low incidence of sexual side effects were also reviewed.  -He desires to schedule a [place] [address] [place], [place] [address] [phone]  I, Joneen Boers Peace, am acting as a Education administrator for Dr. Nicki Reaper C. Rosita Guzzetta.  I have reviewed the above documentation for accuracy and completeness, and I agree with the above.   [name], MD

## 2020-01-10 NOTE — Telephone Encounter (Signed)
   [facility] Pre-operative Risk Assessment    HEARTCARE STAFF: - Please ensure there is not already an duplicate clearance open for this procedure. - Under Visit Info/Reason for Call, type in Other and utilize the format Clearance MM/DD/YY or Clearance TBD. Do not use dashes or single digits. - If request is for dental extraction, please clarify the # of teeth to be extracted.  Request for surgical clearance:  1. What type of surgery is being performed? urolift insertion   2. When is this surgery scheduled? 01/23/20  3. What type of clearance is required (medical clearance vs. Pharmacy clearance to hold med vs. Both)? medical  4. Are there any medications that need to be held prior to surgery and how long? None listed, please advise if needed  5. Practice name and name of physician performing surgery? [facility] - Dr John Giovanni   6. What is the office phone number? [phone]   7.   What is the office fax number? [phone]  8.   Anesthesia type (None, local, MAC, general) ? Not listed    Brian Callahan 01/10/2020, 4:19 PM  _________________________________________________________________   (provider comments below)

## 2020-01-10 NOTE — Telephone Encounter (Signed)
° °  Primary Cardiologist: Ida Rogue, MD  Chart reviewed as part of pre-operative protocol coverage. Given past medical history and time since last visit, based on ACC/AHA guidelines, Brian Callahan would be at acceptable risk for the planned procedure without further cardiovascular testing.   I will route this recommendation to the requesting party via Epic fax function and remove from pre-op pool.  Please call with questions.  Jossie Ng. Leianne Callins NP-C    01/10/2020, 4:27 PM [facility] [address] Office [phone] Fax [phone]

## 2020-01-12 ENCOUNTER — Other Ambulatory Visit: Payer: Self-pay | Admitting: Family Medicine

## 2020-01-12 DIAGNOSIS — N401 Enlarged prostate with lower urinary tract symptoms: Secondary | ICD-10-CM

## 2020-01-15 ENCOUNTER — Other Ambulatory Visit: Payer: Self-pay

## 2020-01-15 ENCOUNTER — Other Ambulatory Visit: Payer: Medicare Other

## 2020-01-15 ENCOUNTER — Other Ambulatory Visit: Payer: Self-pay | Admitting: Radiology

## 2020-01-15 DIAGNOSIS — N401 Enlarged prostate with lower urinary tract symptoms: Secondary | ICD-10-CM

## 2020-01-16 LAB — URINALYSIS, COMPLETE
Bilirubin, UA: NEGATIVE
Glucose, UA: NEGATIVE
Ketones, UA: NEGATIVE
Nitrite, UA: NEGATIVE
Protein,UA: NEGATIVE
RBC, UA: NEGATIVE
Specific Gravity, UA: 1.01 (ref 1.005–1.030)
Urobilinogen, Ur: 0.2 mg/dL (ref 0.2–1.0)
pH, UA: 5.5 (ref 5.0–7.5)

## 2020-01-16 LAB — MICROSCOPIC EXAMINATION
Bacteria, UA: NONE SEEN
WBC, UA: >30 /hpf — AB (ref 0–5)

## 2020-01-18 ENCOUNTER — Other Ambulatory Visit: Payer: Self-pay | Admitting: Urology

## 2020-01-18 ENCOUNTER — Encounter
Admission: RE | Admit: 2020-01-18 | Discharge: 2020-01-18 | Disposition: A | Discharge status: Home / Self Care | Payer: Medicare Other | Source: Ambulatory Visit | Attending: Urology | Admitting: Urology

## 2020-01-18 ENCOUNTER — Telehealth: Payer: Self-pay | Admitting: Radiology

## 2020-01-18 ENCOUNTER — Other Ambulatory Visit: Payer: Self-pay

## 2020-01-18 HISTORY — DX: Type 2 diabetes mellitus without complications: E11.9

## 2020-01-18 HISTORY — DX: Unspecified macular degeneration: H35.30

## 2020-01-18 LAB — CULTURE, URINE COMPREHENSIVE

## 2020-01-18 MED ORDER — SULFAMETHOXAZOLE-TRIMETHOPRIM 800-160 MG PO TABS: 1.0000 | ORAL_TABLET | Freq: Two times a day (BID) | ORAL | 0 refills | Status: AC

## 2020-01-18 NOTE — Telephone Encounter (Signed)
-----   Message from [name], MD sent at 01/18/2020 11:41 AM EDT ----- Preop urine culture was positive.  Antibiotic was sent to pharmacy

## 2020-01-18 NOTE — Telephone Encounter (Signed)
Notified patient of script sent to pharmacy. 

## 2020-01-18 NOTE — Patient Instructions (Addendum)
Your procedure is scheduled on: 01/23/20 Report to [place]. To find out your arrival time please call [phone] between 1PM - 3PM on 01/22/20.  Remember: Instructions that are not followed completely may result in serious medical risk, up to and including death, or upon the discretion of your surgeon and anesthesiologist your surgery may need to be rescheduled.     _X__ 1. Do not eat food after midnight the night before your procedure.                 No gum chewing or hard candies. You may drink clear liquids up to 2 hours                 before you are scheduled to arrive for your surgery- DO not drink clear                 liquids within 2 hours of the start of your surgery.                 Clear Liquids include:  water, apple juice without pulp, clear carbohydrate                 drink such as Clearfast or Gatorade, Black Coffee or Tea (Do not add                 anything to coffee or tea). Diabetics water only  __X__2.  On the morning of surgery brush your teeth with toothpaste and water, you                 may rinse your mouth with mouthwash if you wish.  Do not swallow any              toothpaste of mouthwash.     _X__ 3.  No Alcohol for 24 hours before or after surgery.   _X__ 4.  Do Not Smoke or use e-cigarettes For 24 Hours Prior to Your Surgery.                 Do not use any chewable tobacco products for at least 6 hours prior to                 surgery.  ____  5.  Bring all medications with you on the day of surgery if instructed.   __X__  6.  Notify your doctor if there is any change in your medical condition      (cold, fever, infections).     Do not wear jewelry, make-up, hairpins, clips or nail polish. Do not wear lotions, powders, or perfumes.  Do not shave 48 hours prior to surgery. Men may shave face and neck. Do not bring valuables to the hospital.    [facility] is not responsible for any belongings or  valuables.  Contacts, dentures/partials or body piercings may not be worn into surgery. Bring a case for your contacts, glasses or hearing aids, a denture cup will be supplied. Leave your suitcase in the car. After surgery it may be brought to your room. For patients admitted to the hospital, discharge time is determined by your treatment team.   Patients discharged the day of surgery will not be allowed to drive home.   Please read over the following fact sheets that you were given:   MRSA Information  __X__ Take these medicines the morning of surgery with A SIP OF WATER:  1. ezetimibe (ZETIA) 10 MG tablet  2. finasteride (PROSCAR) 5 MG tablet  3. metoprolol succinate (TOPROL-XL) 50 MG 24 hr tablet  4. omeprazole (PRILOSEC) 20 MG capsule  5. tamsulosin (FLOMAX) 0.4 MG CAPS capsule  6.  ____ Fleet Enema (as directed)   ____ Use CHG Soap/SAGE wipes as directed  __X__ Use inhalers on the day of surgery  ____ Stop metformin/Janumet/Farxiga 2 days prior to surgery    ____ Take 1/2 of usual insulin dose the night before surgery. No insulin the morning          of surgery.   ____ Stop Blood Thinners Coumadin/Plavix/Xarelto/Pleta/Pradaxa/Eliquis/Effient/Aspirin  on   Or contact your Surgeon, Cardiologist or Medical Doctor regarding  ability to stop your blood thinners  __X__ Stop Anti-inflammatories 7 days before surgery such as Advil, Ibuprofen, Motrin,  BC or Goodies Powder, Naprosyn, Naproxen, Aleve, Aspirin    __X__ Stop all herbal supplements, fish oil or vitamin E until after surgery.  Melatonin, Tart Cherry   ____ Bring C-Pap to the hospital.

## 2020-01-19 ENCOUNTER — Other Ambulatory Visit
Admission: RE | Admit: 2020-01-19 | Discharge: 2020-01-19 | Disposition: A | Discharge status: Home / Self Care | Payer: Medicare Other | Source: Ambulatory Visit | Attending: Urology | Admitting: Urology

## 2020-01-19 DIAGNOSIS — Z20822 Contact with and (suspected) exposure to covid-19: Secondary | ICD-10-CM | POA: Insufficient documentation

## 2020-01-19 DIAGNOSIS — Z01812 Encounter for preprocedural laboratory examination: Secondary | ICD-10-CM | POA: Diagnosis present

## 2020-01-20 LAB — SARS CORONAVIRUS 2 (TAT 6-24 HRS): SARS Coronavirus 2: NEGATIVE

## 2020-01-23 ENCOUNTER — Ambulatory Visit: Payer: Medicare Other | Admitting: Anesthesiology

## 2020-01-23 ENCOUNTER — Telehealth: Payer: Self-pay | Admitting: Urology

## 2020-01-23 ENCOUNTER — Ambulatory Visit
Admission: RE | Admit: 2020-01-23 | Discharge: 2020-01-23 | Disposition: A | Discharge status: Home / Self Care | Payer: Medicare Other | Attending: Urology | Admitting: Urology

## 2020-01-23 ENCOUNTER — Other Ambulatory Visit: Payer: Self-pay

## 2020-01-23 ENCOUNTER — Encounter
Admission: RE | Disposition: A | Discharge status: Home / Self Care | Payer: Self-pay | Source: Home / Self Care | Attending: Urology

## 2020-01-23 ENCOUNTER — Encounter: Payer: Self-pay | Admitting: Urology

## 2020-01-23 DIAGNOSIS — N401 Enlarged prostate with lower urinary tract symptoms: Secondary | ICD-10-CM | POA: Insufficient documentation

## 2020-01-23 DIAGNOSIS — Z6828 Body mass index (BMI) 28.0-28.9, adult: Secondary | ICD-10-CM | POA: Insufficient documentation

## 2020-01-23 DIAGNOSIS — Z87891 Personal history of nicotine dependence: Secondary | ICD-10-CM | POA: Diagnosis not present

## 2020-01-23 DIAGNOSIS — L409 Psoriasis, unspecified: Secondary | ICD-10-CM | POA: Diagnosis not present

## 2020-01-23 DIAGNOSIS — I10 Essential (primary) hypertension: Secondary | ICD-10-CM | POA: Diagnosis not present

## 2020-01-23 DIAGNOSIS — Z9049 Acquired absence of other specified parts of digestive tract: Secondary | ICD-10-CM | POA: Diagnosis not present

## 2020-01-23 DIAGNOSIS — K219 Gastro-esophageal reflux disease without esophagitis: Secondary | ICD-10-CM | POA: Diagnosis not present

## 2020-01-23 DIAGNOSIS — E119 Type 2 diabetes mellitus without complications: Secondary | ICD-10-CM | POA: Diagnosis not present

## 2020-01-23 DIAGNOSIS — Z79899 Other long term (current) drug therapy: Secondary | ICD-10-CM | POA: Insufficient documentation

## 2020-01-23 DIAGNOSIS — E669 Obesity, unspecified: Secondary | ICD-10-CM | POA: Diagnosis not present

## 2020-01-23 DIAGNOSIS — R350 Frequency of micturition: Secondary | ICD-10-CM | POA: Insufficient documentation

## 2020-01-23 DIAGNOSIS — J449 Chronic obstructive pulmonary disease, unspecified: Secondary | ICD-10-CM | POA: Insufficient documentation

## 2020-01-23 DIAGNOSIS — R3912 Poor urinary stream: Secondary | ICD-10-CM | POA: Diagnosis not present

## 2020-01-23 DIAGNOSIS — R3914 Feeling of incomplete bladder emptying: Secondary | ICD-10-CM | POA: Insufficient documentation

## 2020-01-23 HISTORY — PX: CYSTOSCOPY WITH INSERTION OF UROLIFT: SHX6678

## 2020-01-23 LAB — GLUCOSE, CAPILLARY
Glucose-Capillary: 112 mg/dL — ABNORMAL HIGH (ref 70–99)
Glucose-Capillary: 123 mg/dL — ABNORMAL HIGH (ref 70–99)

## 2020-01-23 SURGERY — CYSTOSCOPY WITH INSERTION OF UROLIFT
Anesthesia: General

## 2020-01-23 MED ORDER — ORAL CARE MOUTH RINSE: 15.0000 mL | Freq: Once | OROMUCOSAL | Status: AC

## 2020-01-23 MED ORDER — CEFAZOLIN SODIUM-DEXTROSE 2-4 GM/100ML-% IV SOLN
INTRAVENOUS | Status: AC
  Filled 2020-01-23: qty 100

## 2020-01-23 MED ORDER — ONDANSETRON HCL 4 MG/2ML IJ SOLN
INTRAMUSCULAR | Status: DC | PRN
  Administered 2020-01-23: 4 mg via INTRAVENOUS

## 2020-01-23 MED ORDER — CEFAZOLIN SODIUM-DEXTROSE 2-4 GM/100ML-% IV SOLN
2.0000 g | INTRAVENOUS | Status: AC
  Administered 2020-01-23: 2 g via INTRAVENOUS

## 2020-01-23 MED ORDER — DEXMEDETOMIDINE HCL IN NACL 80 MCG/20ML IV SOLN
INTRAVENOUS | Status: AC
  Filled 2020-01-23: qty 20

## 2020-01-23 MED ORDER — PROPOFOL 10 MG/ML IV BOLUS
INTRAVENOUS | Status: DC | PRN
  Administered 2020-01-23: 110 mg via INTRAVENOUS

## 2020-01-23 MED ORDER — ACETAMINOPHEN 10 MG/ML IV SOLN: 1000.0000 mg | Freq: Once | INTRAVENOUS | Status: DC | PRN

## 2020-01-23 MED ORDER — CHLORHEXIDINE GLUCONATE 0.12 % MT SOLN
OROMUCOSAL | Status: AC
  Filled 2020-01-23: qty 15

## 2020-01-23 MED ORDER — OXYCODONE HCL 5 MG/5ML PO SOLN: 5.0000 mg | Freq: Once | ORAL | Status: DC | PRN

## 2020-01-23 MED ORDER — URIBEL 118 MG PO CAPS
1.0000 | ORAL_CAPSULE | Freq: Three times a day (TID) | ORAL | 0 refills | Status: DC | PRN
Start: 2020-01-23 — End: 2020-10-03

## 2020-01-23 MED ORDER — LIDOCAINE HCL (PF) 2 % IJ SOLN
INTRAMUSCULAR | Status: AC
  Filled 2020-01-23: qty 5

## 2020-01-23 MED ORDER — SODIUM CHLORIDE 0.9 % IV SOLN: INTRAVENOUS | Status: DC

## 2020-01-23 MED ORDER — DEXAMETHASONE SODIUM PHOSPHATE 10 MG/ML IJ SOLN
INTRAMUSCULAR | Status: AC
  Filled 2020-01-23: qty 1

## 2020-01-23 MED ORDER — CHLORHEXIDINE GLUCONATE 0.12 % MT SOLN
15.0000 mL | Freq: Once | OROMUCOSAL | Status: AC
  Administered 2020-01-23: 15 mL via OROMUCOSAL

## 2020-01-23 MED ORDER — PROPOFOL 10 MG/ML IV BOLUS
INTRAVENOUS | Status: AC
  Filled 2020-01-23: qty 20

## 2020-01-23 MED ORDER — ONDANSETRON HCL 4 MG/2ML IJ SOLN: 4.0000 mg | Freq: Once | INTRAMUSCULAR | Status: DC | PRN

## 2020-01-23 MED ORDER — DEXAMETHASONE SODIUM PHOSPHATE 10 MG/ML IJ SOLN
INTRAMUSCULAR | Status: DC | PRN
  Administered 2020-01-23: 10 mg via INTRAVENOUS

## 2020-01-23 MED ORDER — LIDOCAINE HCL (CARDIAC) PF 100 MG/5ML IV SOSY
PREFILLED_SYRINGE | INTRAVENOUS | Status: DC | PRN
  Administered 2020-01-23: 100 mg via INTRAVENOUS

## 2020-01-23 MED ORDER — OXYCODONE HCL 5 MG PO TABS: 5.0000 mg | ORAL_TABLET | Freq: Once | ORAL | Status: DC | PRN

## 2020-01-23 MED ORDER — FENTANYL CITRATE (PF) 100 MCG/2ML IJ SOLN: 25.0000 ug | INTRAMUSCULAR | Status: DC | PRN

## 2020-01-23 SURGICAL SUPPLY — 14 items
BAG DRAIN CYSTO-URO LG1000N (MISCELLANEOUS) ×3 IMPLANT
GLOVE BIOGEL PI IND STRL 7.5 (GLOVE) ×1 IMPLANT
GLOVE BIOGEL PI INDICATOR 7.5 (GLOVE) ×2
GOWN STRL REUS W/ TWL LRG LVL3 (GOWN DISPOSABLE) ×1 IMPLANT
GOWN STRL REUS W/ TWL XL LVL3 (GOWN DISPOSABLE) ×1 IMPLANT
GOWN STRL REUS W/TWL LRG LVL3 (GOWN DISPOSABLE) ×3
GOWN STRL REUS W/TWL XL LVL3 (GOWN DISPOSABLE) ×3
KIT TURNOVER CYSTO (KITS) ×3 IMPLANT
PACK CYSTO AR (MISCELLANEOUS) ×3 IMPLANT
SET CYSTO W/LG BORE CLAMP LF (SET/KITS/TRAYS/PACK) ×3 IMPLANT
SURGILUBE 2OZ TUBE FLIPTOP (MISCELLANEOUS) IMPLANT
SYSTEM UROLIFT (Male Continence) ×10 IMPLANT
WATER STERILE IRR 1000ML POUR (IV SOLUTION) ×3 IMPLANT
WATER STERILE IRR 3000ML UROMA (IV SOLUTION) ×3 IMPLANT

## 2020-01-23 NOTE — OR Nursing (Signed)
Discharge pending transportation 

## 2020-01-23 NOTE — Telephone Encounter (Signed)
-----   Message from [name], MD sent at 01/23/2020 11:36 AM EDT ----- Regarding: UroLift follow-up Please schedule follow-up with me in approximately 4 weeks with IPSS, bladder scan

## 2020-01-23 NOTE — Op Note (Signed)
Preoperative diagnosis: BPH with LUTS (urinary frequency, weak urinary stream, incomplete bladder emptying)  Postoperative diagnosis: BPH with LUTS  Procedure: Urolift (placement of 4 implants)  Surgeon: Bernardo Heater  Anesthesia: LMA  Complications: None  Drains: None  Estimated blood loss: < 5 mL  Indications: 79 year-old male with obstructive symptomatology secondary to BPH.  He has moderate lower urinary tract symptoms and a PVR between 100-200 mL on tamsulosin/finasteride.  The patient's symptoms have progressed, and he has requested further management.  Management options including TURP with resection/ablation of the prostate as well as Urolift were discussed.  The patient has chosen to have a Urolift procedure.  He has been instructed to the procedure as well as risks and complications which include but are not limited to infection, bleeding, and inadequate treatment with the Urolift procedure alone, anesthetic complications, among others.  He understands these and desires to proceed.  Findings: Using the 17 French cystoscope, urethra and bladder were inspected.  There were no urethral lesions.  Prostatic urethra was remarkable for moderate lateral lobe enlargement.  The bladder was inspected circumferentially.  This revealed normal findings.  Description of procedure: The patient was properly identified in the holding area.  He received preoperative IV antibiotics.  He was taken to the operating room where general anesthetic was administered with the LMA.  He is placed in the dorsolithotomy position.  Genitalia and perineum were prepped and draped.  Proper timeout was performed.  A 66F cystoscope was inserted into the bladder. The cystoscopy bridge was replaced with a UroLift delivery device and the 1st pair of implants were placed 1.5-2 cm from the bladder neck.  The 2nd pair of implants were placed just proximal to the verumontanum.  Repeat cystoscopy was performed and with the cystoscope  positioned at the verumontanum a continuous anterior channel was present.  No bleeding was noted.  4 implants were delivered in total.   He tolerated procedure well and after anesthetic reversal was transported to the PACU in stable condition.   Jovonne Wilton [employer]. MD

## 2020-01-23 NOTE — Discharge Instructions (Addendum)
Urolift Post-Operative Instructions     Patient Expectations   1. Mild blood in your urine for about 1 week.  2. Urinary buring, frequency, and urgency for 10 days.  3. Mild pelvic pain 1-2 weeks.     Return to Activity     1. Drink water post procedure.  2. Take meds as needed.  Tylenol and/or Motrin is most helpful.  You may also by Pyridium/Azo over-the-counter for urinary burning.  A prescription for Uribel was sent to your pharmacy if over-the-counter medications do not help for burning, frequency and urgency  3. No lifting or straining 48hrs.  4. Other activity when they feel up to it.    AMBULATORY SURGERY  DISCHARGE INSTRUCTIONS   1) The drugs that you were given will stay in your system until tomorrow so for the next 24 hours you should not:  A) Drive an automobile B) Make any legal decisions C) Drink any alcoholic beverage   2) You may resume regular meals tomorrow.  Today it is better to start with liquids and gradually work up to solid foods.  You may eat anything you prefer, but it is better to start with liquids, then soup and crackers, and gradually work up to solid foods.   3) Please notify your doctor immediately if you have any unusual bleeding, trouble breathing, redness and pain at the surgery site, drainage, fever, or pain not relieved by medication.    4) Additional Instructions:        Please contact your physician with any problems or Same Day Surgery at [phone], Monday through Friday 6 am to 4 pm, or [place] at [facility] number at [phone].

## 2020-01-23 NOTE — Transfer of Care (Signed)
Immediate Anesthesia Transfer of Care Note  Patient: TYREIK DELAHOUSSAYE  Procedure(s) Performed: CYSTOSCOPY WITH INSERTION OF UROLIFT (N/A )  Patient Location: PACU  Anesthesia Type:General  Level of Consciousness: awake and patient cooperative  Airway & Oxygen Therapy: Patient Spontanous Breathing and Patient connected to face mask oxygen  Post-op Assessment: Report given to RN and Post -op Vital signs reviewed and stable  Post vital signs: Reviewed and stable  Last Vitals:  Vitals Value Taken Time  BP 129/72 01/23/20 1121  Temp    Pulse 67 01/23/20 1122  Resp 12 01/23/20 1122  SpO2 99 % 01/23/20 1122  Vitals shown include unvalidated device data.  Last Pain:  Vitals:   01/23/20 0813  TempSrc: Temporal  PainSc: 0-No pain         Complications: No complications documented.

## 2020-01-23 NOTE — Anesthesia Postprocedure Evaluation (Signed)
Anesthesia Post Note  Patient: Brian Callahan  Procedure(s) Performed: CYSTOSCOPY WITH INSERTION OF UROLIFT (N/A )  Patient location during evaluation: PACU Anesthesia Type: General Level of consciousness: awake and alert Pain management: pain level controlled Vital Signs Assessment: post-procedure vital signs reviewed and stable Respiratory status: spontaneous breathing, nonlabored ventilation, respiratory function stable and patient connected to nasal cannula oxygen Cardiovascular status: blood pressure returned to baseline and stable Postop Assessment: no apparent nausea or vomiting Anesthetic complications: no   No complications documented.   Last Vitals:  Vitals:   01/23/20 1211 01/23/20 1253  BP: (!) 148/80 (!) 152/85  Pulse: 69 71  Resp: 16 16  Temp: (!) 36.4 C 36.5 C  SpO2: 96% 97%    Last Pain:  Vitals:   01/23/20 1253  TempSrc: Temporal  PainSc: 0-No pain                 Arita Miss

## 2020-01-23 NOTE — Interval H&P Note (Signed)
History and Physical Interval Note:  01/23/2020 10:44 AM  Brian Callahan  has presented today for surgery, with the diagnosis of benign prostatic hypertrophy with lower urinary tract symptoms.  The various methods of treatment have been discussed with the patient and family. After consideration of risks, benefits and other options for treatment, the patient has consented to  Procedure(s): CYSTOSCOPY WITH INSERTION OF UROLIFT (N/A) as a surgical intervention.  The patient's history has been reviewed, patient examined, no change in status, stable for surgery.  I have reviewed the patient's chart and labs.  Questions were answered to the patient's satisfaction.     [place]

## 2020-01-23 NOTE — Anesthesia Procedure Notes (Addendum)
Procedure Name: LMA Insertion °Performed by: Kambria Grima R, CRNA °Pre-anesthesia Checklist: Patient identified, Emergency Drugs available, Suction available and Patient being monitored °Patient Re-evaluated:Patient Re-evaluated prior to induction °Oxygen Delivery Method: Circle system utilized °Preoxygenation: Pre-oxygenation with 100% oxygen °Induction Type: IV induction °LMA: LMA inserted °LMA Size: 4.0 °Tube type: Oral °Number of attempts: 1 °Placement Confirmation: positive ETCO2 and breath sounds checked- equal and bilateral °Dental Injury: Teeth and Oropharynx as per pre-operative assessment  ° ° ° ° ° ° °

## 2020-01-23 NOTE — Telephone Encounter (Signed)
App made pt is aware 

## 2020-01-23 NOTE — Anesthesia Preprocedure Evaluation (Addendum)
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    + decreased breath sounds      Cardiovascular Exercise Tolerance: Good hypertension, (-) angina(-) Past MI and (-) DOE  Rhythm:Regular Rate:Normal - Systolic murmurs    Neuro/Psych negative neurological ROS  negative psych ROS   GI/Hepatic Neg liver ROS, GERD  Medicated and Controlled,  Endo/Other  diabetes, Type 2  Renal/GU negative Renal ROS  negative genitourinary   Musculoskeletal   Abdominal   Peds  Hematology negative hematology ROS (+)   Anesthesia Other Findings Past Medical History: No date: Anemia 05/23/2015: BP (high blood pressure) No date: BPH with obstruction/lower urinary tract symptoms No date: COPD (chronic obstructive pulmonary disease) (HCC) No date: Dyspnea No date: Encounter for long-term (current) use of other high-risk  medications No date: GERD (gastroesophageal reflux disease) No date: GI bleed No date: Gout mid-90s: H/O hernia repair     Comment:  x2 No date: History of elevated PSA No date: History of [place] spotted fever No date: HLD (hyperlipidemia) No date: HTN (hypertension) No date: Hyperglycemia No date: Hyperuricemia No date: Incomplete bladder emptying No date: Obesity No  date: Psoriasis No date: Psoriasis No date: Urinary frequency  Past Surgical History: No date: APPENDECTOMY No date: BACK SURGERY     Comment:  neck and spine 2007 2007: CERVICAL SPINE SURGERY 10/18/2017: CHOLECYSTECTOMY; N/A     Comment:  Procedure: LAPAROSCOPIC CHOLECYSTECTOMY;  Surgeon:               [name], MD;  Location: [facility] ORS;  Service:              General;  Laterality: N/A; 1998: COLECTOMY     Comment:  partial, due to obstruction No date: COLONOSCOPY     Comment:  lost 10 units of blood 10/14/2016: ESOPHAGOGASTRODUODENOSCOPY; N/A     Comment:  Procedure: ESOPHAGOGASTRODUODENOSCOPY (EGD);  Surgeon:               [name], MD;  Location: [facility] ENDOSCOPY;                Service: Endoscopy;  Laterality: N/A; No date: EYE SURGERY     Comment:  bilateral cataracts mid-90s: HERNIA REPAIR     Comment:  x2  inguinal 2013: PROSTATE BIOPSY No date: SURGERY SCROTAL / TESTICULAR     Comment:  for fibrous growth  BMI    Body Mass Index: 29.05 kg/m      Reproductive/Obstetrics negative OB ROS                            Anesthesia Physical  Anesthesia Plan  ASA: III  Anesthesia Plan: General   Post-op Pain Management:    Induction: Intravenous  PONV Risk Score and Plan: 3 and Propofol infusion, TIVA, Ondansetron and Dexamethasone  Airway  Management Planned: LMA  Additional Equipment: None  Intra-op Plan:   Post-operative Plan: Extubation in OR  Informed Consent: I have reviewed the patients History and Physical, chart, labs and discussed the procedure including the risks, benefits and alternatives for the proposed anesthesia with the patient or authorized representative who has indicated his/her understanding and acceptance.     Dental Advisory Given  Plan Discussed with: CRNA and Surgeon  Anesthesia Plan Comments: (Patient consented for risks of anesthesia including but not limited to:  - adverse reactions to  medications - risk of intubation if required - damage to teeth, lips or other oral mucosa - sore throat or hoarseness - Damage to heart, brain, lungs or loss of life  Patient voiced understanding.)        Anesthesia Quick Evaluation

## 2020-02-21 ENCOUNTER — Ambulatory Visit (INDEPENDENT_AMBULATORY_CARE_PROVIDER_SITE_OTHER): Payer: Medicare Other | Admitting: Urology

## 2020-02-21 ENCOUNTER — Encounter: Payer: Self-pay | Admitting: Urology

## 2020-02-21 VITALS — BP 129/71 | HR 84 | Ht 66.0 in | Wt 186.0 lb

## 2020-02-21 DIAGNOSIS — N401 Enlarged prostate with lower urinary tract symptoms: Secondary | ICD-10-CM

## 2020-02-21 DIAGNOSIS — N138 Other obstructive and reflux uropathy: Secondary | ICD-10-CM | POA: Diagnosis not present

## 2020-02-21 LAB — BLADDER SCAN AMB NON-IMAGING: Scan Result: 44

## 2020-02-21 NOTE — Progress Notes (Signed)
12/26/19 11:20 AM   Brian Callahan 1941-05-05 [id number]  Referring provider: Idelle Crouch, MD [facility] [address],  [place] [address] Chief Complaint  Patient presents with  . Benign Prostatic Hypertrophy    HPI: Brian Callahan is a 79 y.o. male who presents today for a follow up.  -Status post urolift 01/23/2020  -Pre-op PVR was 259 mL -PVR today was 44 mL, IPSS today was 7/35 -Post op he had frequency, urgency, hematuria for 2 weeks which is improving -Voiding with good stream  PMH: Past Medical History:  Diagnosis Date  . Anemia    pt denies 01/18/20  . BP (high blood pressure) 05/23/2015  . BPH with obstruction/lower urinary tract symptoms   . COPD (chronic obstructive pulmonary disease) ([place])   . Diabetes mellitus without complication (HCC)    diet controlled  . Dyspnea   . Encounter for long-term (current) use of other high-risk medications   . GERD (gastroesophageal reflux disease)    gastritis  . GI bleed   . Gout   . H/O hernia repair mid-90s   x2  . History of elevated PSA   . History of [facility] spotted fever   . HLD (hyperlipidemia)   . HTN (hypertension)   . Hyperglycemia   . Hyperuricemia   . Incomplete bladder emptying   . Macular degeneration   . Obesity   . Psoriasis   . Psoriasis   . Urinary frequency     Surgical History: Past Surgical History:  Procedure Laterality Date  . APPENDECTOMY    . BACK SURGERY     neck and spine 2007  . CERVICAL SPINE SURGERY  2007  . CHOLECYSTECTOMY N/A 10/18/2017   Procedure: LAPAROSCOPIC CHOLECYSTECTOMY;  Surgeon: [name], MD;  Location: [facility] ORS;  Service: General;  Laterality: N/A;  . COLECTOMY  1998   partial, due to obstruction  . COLONOSCOPY     lost 10 units of blood  . COLONOSCOPY WITH PROPOFOL N/A 09/27/2019   Procedure: COLONOSCOPY WITH PROPOFOL;  Surgeon: [name], [name], MD;  Location: [facility] ENDOSCOPY;  Service: Gastroenterology;   Laterality: N/A;  . CYSTOSCOPY WITH INSERTION OF UROLIFT N/A 01/23/2020   Procedure: CYSTOSCOPY WITH INSERTION OF UROLIFT;  Surgeon: [name], MD;  Location: [facility] ORS;  Service: Urology;  Laterality: N/A;  . ESOPHAGOGASTRODUODENOSCOPY N/A 10/14/2016   Procedure: ESOPHAGOGASTRODUODENOSCOPY (EGD);  Surgeon: [name], MD;  Location: [facility] ENDOSCOPY;  Service: Endoscopy;  Laterality: N/A;  . ESOPHAGOGASTRODUODENOSCOPY (EGD) WITH PROPOFOL N/A 09/27/2019   Procedure: ESOPHAGOGASTRODUODENOSCOPY (EGD) WITH PROPOFOL;  Surgeon: [name], [name], MD;  Location: [facility] ENDOSCOPY;  Service: Gastroenterology;  Laterality: N/A;  . EYE SURGERY     bilateral cataracts  . HERNIA REPAIR  mid-90s   x2  inguinal  . PROSTATE BIOPSY  2013  . SURGERY SCROTAL / TESTICULAR     for fibrous growth    Home Medications:  Allergies as of 02/21/2020      Reactions   Isosorbide Nitrate Other (See Comments)   unkn   Shellfish Allergy Other (See Comments)   gout   Trazodone And Nefazodone Diarrhea      Medication List       Accurate as of February 21, 2020 11:20 AM. If you have any questions, ask your nurse or doctor.        acetaminophen 500 MG tablet Commonly known as: TYLENOL Take 500-1,000 mg by mouth every 6 (six) hours as needed (for  headache/minor pain.).   Advair Diskus 250-50 MCG/DOSE Aepb Generic drug: Fluticasone-Salmeterol Inhale 1 puff 2 (two) times daily into the lungs.   betamethasone dipropionate 0.05 % cream Apply 1 application topically 2 (two) times daily as needed (for psoriasis).   clobetasol ointment 0.05 % Commonly known as: TEMOVATE Apply 1 application topically daily as needed (psoriasis).   enalapril 5 MG tablet Commonly known as: VASOTEC Take 1 tablet (5 mg total) by mouth 2 (two) times daily.   ezetimibe 10 MG tablet Commonly known as: ZETIA Take 10 mg by mouth daily.   finasteride 5 MG tablet Commonly known as: PROSCAR TAKE 1 TABLET BY MOUTH  DAILY     Melatonin 10 MG Tabs Take 10 mg by mouth at bedtime as needed (sleeo).   metoprolol succinate 50 MG 24 hr tablet Commonly known as: TOPROL-XL Take 50 mg by mouth daily. Take with or immediately following a meal.   omeprazole 20 MG capsule Commonly known as: PRILOSEC Take 20 mg by mouth daily.   PRESERVISION AREDS 2 PO Take 2 capsules by mouth daily.   tamsulosin 0.4 MG Caps capsule Commonly known as: FLOMAX TAKE 1 CAPSULE BY MOUTH  TWICE DAILY What changed:   how much to take  how to take this  when to take this   TART CHERRY ADVANCED PO Take 12 mg by mouth daily.   Uribel 118 MG Caps Take 1 capsule (118 mg total) by mouth 3 (three) times daily as needed (Urinary frequency, urgency, burning).       Allergies:  Allergies  Allergen Reactions  . Isosorbide Nitrate Other (See Comments)    unkn  . Shellfish Allergy Other (See Comments)    gout  . Trazodone And Nefazodone Diarrhea    Family History: Family History  Problem Relation Age of Onset  . Kidney failure Brother   . Prostate cancer Neg Hx     Social History:  reports that he quit smoking about 29 years ago. He has never used smokeless tobacco. He reports current alcohol use of about 18.0 standard drinks of alcohol per week. He reports that he does not use drugs.   Physical Exam: BP (!) 129/71   Pulse 84   Ht 5\' 6"  (1.676 m)   Wt 186 lb (84.4 kg)   BMI 30.02 kg/m   Constitutional:  Alert and oriented, No acute distress. HEENT: [place] AT, moist mucus membranes.  Trachea midline, no masses. Cardiovascular: No clubbing, cyanosis, or edema. Respiratory: Normal respiratory effort, no increased work of breathing. Skin: No rashes, bruises or suspicious lesions. Neurologic: Grossly intact, no focal deficits, moving all 4 extremities. Psychiatric: Normal mood and affect.   Assessment & Plan:    1. BPH with LUTS -Status post UroLift with improvement in symptomology -discontinue tamsulosin -Follow up in  6 months with PVR and [place] [address], [place], [place] [address] [phone]  I, Joneen Boers Peace, am acting as a Education administrator for Dr. Nicki Reaper C. Shamell Suarez.  I have reviewed the above documentation for accuracy and completeness, and I agree with the above.    [name], MD

## 2020-02-22 ENCOUNTER — Encounter: Payer: Self-pay | Admitting: Urology

## 2020-02-26 NOTE — Unmapped (Signed)
DERMATOLOGY CLINIC NOTE    ASSESSMENT AND PLAN:     Psoriasis and psoriatic arthritis, reasonably controlled off Humira:  -He is having his significant other help him with a topical ointment which he gets good benefit from.  -He would like to stay off the Humira for now unless he flares.  Should he flare we can revisit.  -He has a follow-up with rheumatology next week.  -Unable to take methotrexate due to history of elevated liver enzymes.     Return to clinic:  6 months or sooner as needed.       CHIEF COMPLAINT:  Follow-up psoriasis     HPI:   This is a pleasant 79 y.o. male who last saw me on 11/21/2019 for psoriatic arthritis and psoriasis.  At that time we started him on Humira and continue the clobetasol.  At the time of last visit he had been off Humira for several months and he had felt like it was helpful.  He also follows with Dr. Sullivan Lone in rheumatology.  He is unable to take methotrexate due to history of elevated liver enzymes.    He returns today for follow-up.  He says his wife has been helping him to apply the topical steroid clobetasol and he is gotten great benefit from that.  He has been off the Humira now for several months.  We discussed last time restarting the Humira and had planned on doing that but he never did.  Since his wife is helping him with the clobetasol applications, he feels he gets better benefit from this.    He has a follow-up with his rheumatologist next week.  This is Dr. Sullivan Lone.  He feels like his arthritis is doing fairly well as well.    He also recently had a UroLift procedure on 01/23/2020 with urology.     PAST MEDICAL HISTORY:  Peripheral artery disease  Hypertension  Hyperlipidemia  Hyperglycemia  Psoriasis  ??  Hx of psoriasis:  -H/o of psoriasis >??25 years, but flared in 2013 after a steroid injection for gout.   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 Not a candidate for MTX due to EtOH use and transaminitis MEDICATIONS:   Current Outpatient Medications on File Prior to Visit   Medication Sig Dispense Refill   ??? acetaminophen (TYLENOL) 500 MG tablet Take 500-1,000 mg by mouth.     ??? ANTIOX #8/OM3/DHA/EPA/LUT/ZEAX (PRESERVISION AREDS 2, OMEGA-3, ORAL) Take by mouth daily at 0600.     ??? C/SOURCHERRY/CELERY/GRAPE SEED (TART CHERRY ORAL) Take by mouth.     ??? clobetasoL (TEMOVATE) 0.05 % ointment Apply topically twice daily as needed for psoriasis. 60 g 5   ??? enalapril (VASOTEC) 5 MG tablet Two (2) times a day.      ??? ezetimibe (ZETIA) 10 mg tablet Take 1 tablet by mouth once a day     ??? finasteride (PROSCAR) 5 mg tablet      ??? finasteride (PROSCAR) 5 mg tablet TAKE 1 TABLET (5 MG)  BY MOUTH  DAILY     ??? fluticasone-salmeterol (ADVAIR DISKUS) 250-50 mcg/dose diskus Use 1 inhalation every 12  hours     ??? HUMIRA PEN CITRATE FREE 40 MG/0.4 ML Inject the contents of 2 pens (80mg ) under the skin once for initial dose. 2 each 0   ??? HUMIRA PEN CITRATE FREE 40 MG/0.4 ML Inject the contents of 1 pen (40mg ) under the skin every other week beginning 1 week after  initial dose. 4 each PRN   ??? metoprolol-hydrochlorothiazide (LOPRESSOR HCT) 50-25 mg per tablet Take 1 tablet by mouth once daily     ??? omeprazole (PRILOSEC) 40 MG capsule 20 mg.      ??? tamsulosin (FLOMAX) 0.4 mg capsule Take by mouth.       No current facility-administered medications on file prior to visit.       ALLERGIES:   Shellfish  Isosorbide    SOCIAL HISTORY:  Accompanied by significant other     REVIEW OF SYSTEMS:  Baseline state of health. No recent illnesses. No other skin complaints.     PHYSICAL EXAMINATION:  Examination in the presence of chaperone:  General: Well-developed, well-nourished. No acute distress.   Neuro: Alert and oriented, answers questions appropriately.  Skin: Examination of the scalp, face, head, neck, back, upper extremities, lower extremities, hands, palms, soles was performed and notable for the following:  Small psoriatic patches on lower extremities scattered     Dictation software was used while making this note. Please excuse any errors made with dictation software.

## 2020-02-27 ENCOUNTER — Ambulatory Visit: Admit: 2020-02-27 | Discharge: 2020-02-28 | Payer: MEDICARE

## 2020-02-27 DIAGNOSIS — L409 Psoriasis, unspecified: Principal | ICD-10-CM

## 2020-04-22 ENCOUNTER — Other Ambulatory Visit: Payer: Self-pay

## 2020-04-22 ENCOUNTER — Encounter: Payer: Medicare Other | Attending: Pulmonary Disease

## 2020-04-22 DIAGNOSIS — J439 Emphysema, unspecified: Secondary | ICD-10-CM | POA: Insufficient documentation

## 2020-04-22 NOTE — Progress Notes (Signed)
Virtual Visit completed. Patient informed on EP and RD appointment and 6 Minute walk test. Patient also informed of patient health questionnaires on My Chart. Patient Verbalizes understanding. Visit diagnosis can be found in [facility] 03/07/2020.

## 2020-04-23 VITALS — Ht 67.5 in | Wt 184.3 lb

## 2020-04-23 DIAGNOSIS — J439 Emphysema, unspecified: Secondary | ICD-10-CM | POA: Diagnosis present

## 2020-04-23 NOTE — Patient Instructions (Addendum)
Patient Instructions  Patient Details  Name: Brian Callahan MRN: [id number] Date of Birth: Sep 25, 1940 Referring Provider:  Ottie Glazier, MD  Below are your personal goals for exercise, nutrition, and risk factors. Our goal is to help you stay on track towards obtaining and maintaining these goals. We will be discussing your progress on these goals with you throughout the program.  Initial Exercise Prescription:  Initial Exercise Prescription - 04/23/20 1500      Date of Initial Exercise RX and Referring Provider   Date 04/23/20    Referring Provider Ottie Glazier MD      Treadmill   MPH 1.6    Grade 0.5    Minutes 15    METs 2.32      REL-XR   Level 2    Minutes 15    METs 2.2      T5 Nustep   Level 1    SPM 80    Minutes 15    METs 2.2      Prescription Details   Frequency (times per week) 2      Intensity   THRR 40-80% of Max Heartrate 100-129    Ratings of Perceived Exertion 11-13    Perceived Dyspnea 0-4      Progression   Progression Continue to progress workloads to maintain intensity without signs/symptoms of physical distress.      Resistance Training   Training Prescription Yes    Weight 3 lb    Reps 10-15           Exercise Goals: Frequency: Be able to perform aerobic exercise two to three times per week in program working toward 2-5 days per week of home exercise.  Intensity: Work with a perceived exertion of 11 (fairly light) - 15 (hard) while following your exercise prescription.  We will make changes to your prescription with you as you progress through the program.   Duration: Be able to do 30 to 45 minutes of continuous aerobic exercise in addition to a 5 minute warm-up and a 5 minute cool-down routine.   Nutrition Goals: Your personal nutrition goals will be established when you do your nutrition analysis with the dietician.  The following are general nutrition guidelines to follow: Cholesterol < 200mg /day Sodium <  1500mg /day Fiber: Men over 50 yrs - 30 grams per day  Personal Goals:  Personal Goals and Risk Factors at Admission - 04/23/20 1451      Core Components/Risk Factors/Patient Goals on Admission    Weight Management Yes;Weight Loss    Intervention Weight Management: Develop a combined nutrition and exercise program designed to reach desired caloric intake, while maintaining appropriate intake of nutrient and fiber, sodium and fats, and appropriate energy expenditure required for the weight goal.;Weight Management: Provide education and appropriate resources to help participant work on and attain dietary goals.;Weight Management/Obesity: Establish reasonable short term and long term weight goals.    Admit Weight 184 lb 4.8 oz (83.6 kg)    Goal Weight: Short Term 179 lb (81.2 kg)    Goal Weight: Long Term 174 lb (78.9 kg)    Expected Outcomes Short Term: Continue to assess and modify interventions until short term weight is achieved;Long Term: Adherence to nutrition and physical activity/exercise program aimed toward attainment of established weight goal;Weight Loss: Understanding of general recommendations for a balanced deficit meal plan, which promotes 1-2 lb weight loss per week and includes a negative energy balance of [phone] kcal/d;Understanding recommendations for meals to include 15-35% energy as  protein, 25-35% energy from fat, 35-60% energy from carbohydrates, less than 200mg  of dietary cholesterol, 20-35 gm of total fiber daily;Understanding of distribution of calorie intake throughout the day with the consumption of 4-5 meals/snacks    Improve shortness of breath with ADL's Yes    Intervention Provide education, individualized exercise plan and daily activity instruction to help decrease symptoms of SOB with activities of daily living.    Expected Outcomes Short Term: Improve cardiorespiratory fitness to achieve a reduction of symptoms when performing ADLs;Long Term: Be able to perform more  ADLs without symptoms or delay the onset of symptoms    Diabetes Yes   Diet controlled   Intervention Provide education about signs/symptoms and action to take for hypo/hyperglycemia.;Provide education about proper nutrition, including hydration, and aerobic/resistive exercise prescription along with prescribed medications to achieve blood glucose in normal ranges: Fasting glucose 65-99 mg/dL    Expected Outcomes Short Term: Participant verbalizes understanding of the signs/symptoms and immediate care of hyper/hypoglycemia, proper foot care and importance of medication, aerobic/resistive exercise and nutrition plan for blood glucose control.;Long Term: Attainment of HbA1C < 7%.    Hypertension Yes    Intervention Provide education on lifestyle modifcations including regular physical activity/exercise, weight management, moderate sodium restriction and increased consumption of fresh fruit, vegetables, and low fat dairy, alcohol moderation, and smoking cessation.;Monitor prescription use compliance.    Expected Outcomes Short Term: Continued assessment and intervention until BP is < 140/12mm HG in hypertensive participants. < 130/72mm HG in hypertensive participants with diabetes, heart failure or chronic kidney disease.;Long Term: Maintenance of blood pressure at goal levels.    Lipids Yes    Intervention Provide education and support for participant on nutrition & aerobic/resistive exercise along with prescribed medications to achieve LDL 70mg , HDL >40mg .    Expected Outcomes Short Term: Participant states understanding of desired cholesterol values and is compliant with medications prescribed. Participant is following exercise prescription and nutrition guidelines.;Long Term: Cholesterol controlled with medications as prescribed, with individualized exercise RX and with personalized nutrition plan. Value goals: LDL < 70mg , HDL > 40 mg.           Tobacco Use Initial Evaluation: Social History    Tobacco Use  Smoking Status Former Smoker  . Packs/day: 0.25  . Years: 10.00  . Pack years: 2.50  . Types: Cigarettes  . Quit date: 10/12/1990  . Years since quitting: 29.5  Smokeless Tobacco Never Used  Tobacco Comment   Quit 20 years ago    Exercise Goals and Review:  Exercise Goals    Row Name 04/23/20 1449             Exercise Goals   Increase Physical Activity Yes       Intervention Provide advice, education, support and counseling about physical activity/exercise needs.;Develop an individualized exercise prescription for aerobic and resistive training based on initial evaluation findings, risk stratification, comorbidities and participant's personal goals.       Expected Outcomes Short Term: Attend rehab on a regular basis to increase amount of physical activity.;Long Term: Add in home exercise to make exercise part of routine and to increase amount of physical activity.;Long Term: Exercising regularly at least 3-5 days a week.       Increase Strength and Stamina Yes       Intervention Provide advice, education, support and counseling about physical activity/exercise needs.;Develop an individualized exercise prescription for aerobic and resistive training based on initial evaluation findings, risk stratification, comorbidities and participant's personal goals.  Expected Outcomes Short Term: Increase workloads from initial exercise prescription for resistance, speed, and METs.;Short Term: Perform resistance training exercises routinely during rehab and add in resistance training at home;Long Term: Improve cardiorespiratory fitness, muscular endurance and strength as measured by increased METs and functional capacity (6MWT)       Able to understand and use rate of perceived exertion (RPE) scale Yes       Intervention Provide education and explanation on how to use RPE scale       Expected Outcomes Short Term: Able to use RPE daily in rehab to express subjective intensity  level;Long Term:  Able to use RPE to guide intensity level when exercising independently       Able to understand and use Dyspnea scale Yes       Intervention Provide education and explanation on how to use Dyspnea scale       Expected Outcomes Short Term: Able to use Dyspnea scale daily in rehab to express subjective sense of shortness of breath during exertion;Long Term: Able to use Dyspnea scale to guide intensity level when exercising independently       Knowledge and understanding of Target Heart Rate Range (THRR) Yes       Intervention Provide education and explanation of THRR including how the numbers were predicted and where they are located for reference       Expected Outcomes Short Term: Able to state/look up THRR;Short Term: Able to use daily as guideline for intensity in rehab;Long Term: Able to use THRR to govern intensity when exercising independently       Able to check pulse independently Yes       Intervention Provide education and demonstration on how to check pulse in carotid and radial arteries.;Review the importance of being able to check your own pulse for safety during independent exercise       Expected Outcomes Short Term: Able to explain why pulse checking is important during independent exercise;Long Term: Able to check pulse independently and accurately       Understanding of Exercise Prescription Yes       Intervention Provide education, explanation, and written materials on patient's individual exercise prescription       Expected Outcomes Short Term: Able to explain program exercise prescription;Long Term: Able to explain home exercise prescription to exercise independently              Copy of goals given to participant.

## 2020-04-23 NOTE — Progress Notes (Signed)
Pulmonary Individual Treatment Plan  Patient Details  Name: Brian Callahan MRN: [id number] Date of Birth: 11/19/1940 Referring Provider:     Pulmonary Rehab from 04/23/2020 in [facility] Cardiac and Pulmonary Rehab  Referring Provider Ottie Glazier MD      Initial Encounter Date:    Pulmonary Rehab from 04/23/2020 in [facility] Cardiac and Pulmonary Rehab  Date 04/23/20      Visit Diagnosis: Pulmonary emphysema, unspecified emphysema type ([place])  Patient's Home Medications on Admission:  Current Outpatient Medications:  .  acetaminophen (TYLENOL) 500 MG tablet, Take 500-1,000 mg by mouth every 6 (six) hours as needed (for headache/minor pain.)., Disp: , Rfl:  .  betamethasone dipropionate (DIPROLENE) 0.05 % cream, Apply 1 application topically 2 (two) times daily as needed (for psoriasis)., Disp: , Rfl:  .  clobetasol ointment (TEMOVATE) 2.11 %, Apply 1 application topically daily as needed (psoriasis). , Disp: , Rfl:  .  enalapril (VASOTEC) 5 MG tablet, Take 1 tablet (5 mg total) by mouth 2 (two) times daily., Disp: , Rfl:  .  ezetimibe (ZETIA) 10 MG tablet, Take 10 mg by mouth daily., Disp: , Rfl:  .  finasteride (PROSCAR) 5 MG tablet, TAKE 1 TABLET BY MOUTH  DAILY (Patient not taking: Reported on 02/21/2020), Disp: 90 tablet, Rfl: 3 .  Fluticasone-Salmeterol (ADVAIR DISKUS) 250-50 MCG/DOSE AEPB, Inhale 1 puff 2 (two) times daily into the lungs.  (Patient not taking: Reported on 04/22/2020), Disp: , Rfl:  .  Melatonin 10 MG TABS, Take 10 mg by mouth at bedtime as needed (sleeo)., Disp: , Rfl:  .  Meth-Hyo-M Bl-Na Phos-Ph Sal (URIBEL) 118 MG CAPS, Take 1 capsule (118 mg total) by mouth 3 (three) times daily as needed (Urinary frequency, urgency, burning). (Patient not taking: Reported on 04/22/2020), Disp: 15 capsule, Rfl: 0 .  metoprolol succinate (TOPROL-XL) 50 MG 24 hr tablet, Take 50 mg by mouth daily. Take with or immediately following a meal., Disp: , Rfl:  .  Misc Natural Products (TART CHERRY  ADVANCED PO), Take 12 mg by mouth daily. , Disp: , Rfl:  .  Multiple Vitamins-Minerals (PRESERVISION AREDS 2 PO), Take 2 capsules by mouth daily. , Disp: , Rfl:  .  omeprazole (PRILOSEC) 20 MG capsule, Take 20 mg by mouth daily., Disp: , Rfl:  .  tamsulosin (FLOMAX) 0.4 MG CAPS capsule, TAKE 1 CAPSULE BY MOUTH  TWICE DAILY (Patient not taking: Reported on 04/22/2020), Disp: 180 capsule, Rfl: 3  Past Medical History: Past Medical History:  Diagnosis Date  . Anemia    pt denies 01/18/20  . BP (high blood pressure) 05/23/2015  . BPH with obstruction/lower urinary tract symptoms   . COPD (chronic obstructive pulmonary disease) ([place])   . Diabetes mellitus without complication (HCC)    diet controlled  . Dyspnea   . Encounter for long-term (current) use of other high-risk medications   . GERD (gastroesophageal reflux disease)    gastritis  . GI bleed   . Gout   . H/O hernia repair mid-90s   x2  . History of elevated PSA   . History of [facility] spotted fever   . HLD (hyperlipidemia)   . HTN (hypertension)   . Hyperglycemia   . Hyperuricemia   . Incomplete bladder emptying   . Macular degeneration   . Obesity   . Psoriasis   . Psoriasis   . Urinary frequency     Tobacco Use: Social History   Tobacco Use  Smoking Status Former Smoker  .  Packs/day: 0.25  . Years: 10.00  . Pack years: 2.50  . Types: Cigarettes  . Quit date: 10/12/1990  . Years since quitting: 29.5  Smokeless Tobacco Never Used  Tobacco Comment   Quit 20 years ago    Labs: Recent Review Scientist, physiological    Labs for ITP Cardiac and Pulmonary Rehab Latest Ref Rng & Units 04/11/2012   Hemoglobin A1c 4.2 - 6.3 % 4.8       Pulmonary Assessment Scores:  Pulmonary Assessment Scores    Row Name 04/23/20 1454         ADL UCSD   ADL Phase Entry     SOB Score total 7     Rest 0     Walk 0     Stairs 3     Bath 0     Dress 0     Shop 0       CAT Score   CAT Score 10       mMRC Score   mMRC  Score 1            UCSD: Self-administered rating of dyspnea associated with activities of daily living (ADLs) 6-point scale (0 = "not at all" to 5 = "maximal or unable to do because of breathlessness")  Scoring Scores range from 0 to 120.  Minimally important difference is 5 units  CAT: CAT can identify the health impairment of COPD patients and is better correlated with disease progression.  CAT has a scoring range of zero to 40. The CAT score is classified into four groups of low (less than 10), medium (10 - 20), high (21-30) and very high (31-40) based on the impact level of disease on health status. A CAT score over 10 suggests significant symptoms.  A worsening CAT score could be explained by an exacerbation, poor medication adherence, poor inhaler technique, or progression of COPD or comorbid conditions.  CAT MCID is 2 points  mMRC: mMRC (Modified Medical Research Council) Dyspnea Scale is used to assess the degree of baseline functional disability in patients of respiratory disease due to dyspnea. No minimal important difference is established. A decrease in score of 1 point or greater is considered a positive change.   Pulmonary Function Assessment:  Pulmonary Function Assessment - 04/22/20 1345      Breath   Shortness of Breath Limiting activity;Yes           Exercise Target Goals: Exercise Program Goal: Individual exercise prescription set using results from initial 6 min walk test and THRR while considering  patient's activity barriers and safety.   Exercise Prescription Goal: Initial exercise prescription builds to 30-45 minutes a day of aerobic activity, 2-3 days per week.  Home exercise guidelines will be given to patient during program as part of exercise prescription that the participant will acknowledge.  Education: Aerobic Exercise & Resistance Training: - Gives group verbal and written instruction on the various components of exercise. Focuses on aerobic and  resistive training programs and the benefits of this training and how to safely progress through these programs..   Education: Exercise & Equipment Safety: - Individual verbal instruction and demonstration of equipment use and safety with use of the equipment.   Pulmonary Rehab from 04/22/2020 in [facility] Cardiac and Pulmonary Rehab  Date 04/22/20  Educator [facility]  Instruction Review Code 1- Verbalizes Understanding      Education: Exercise Physiology & General Exercise Guidelines: - Group verbal and written instruction with models to review the exercise  physiology of the cardiovascular system and associated critical values. Provides general exercise guidelines with specific guidelines to those with heart or lung disease.    Education: Flexibility, Balance, Mind/Body Relaxation: Provides group verbal/written instruction on the benefits of flexibility and balance training, including mind/body exercise modes such as yoga, pilates and tai chi.  Demonstration and skill practice provided.   Activity Barriers & Risk Stratification:  Activity Barriers & Cardiac Risk Stratification - 04/23/20 1450      Activity Barriers & Cardiac Risk Stratification   Activity Barriers Arthritis   Both knees and hips          6 Minute Walk:  6 Minute Walk    Row Name 04/23/20 1437         6 Minute Walk   Phase Initial     Distance 990 feet     Walk Time 6 minutes     # of Rest Breaks 0     MPH 1.8     METS 2.28     RPE 11     Perceived Dyspnea  0     VO2 Peak 8     Symptoms No     Resting HR 71 bpm     Resting BP 128/72     Resting Oxygen Saturation  94 %     Exercise Oxygen Saturation  during 6 min walk 90 %     Max Ex. HR 106 bpm     Max Ex. BP 162/68     2 Minute Post BP 130/70       Interval HR   1 Minute HR 94     2 Minute HR 85     3 Minute HR 100     4 Minute HR 106     5 Minute HR 92     6 Minute HR 104     2 Minute Post HR 73     Interval Heart Rate? Yes       Interval Oxygen     Interval Oxygen? Yes     Baseline Oxygen Saturation % 94 %     1 Minute Oxygen Saturation % 92 %     1 Minute Liters of Oxygen 0 L  RA     2 Minute Oxygen Saturation % 90 %     2 Minute Liters of Oxygen 0 L     3 Minute Oxygen Saturation % 90 %     3 Minute Liters of Oxygen 0 L     4 Minute Oxygen Saturation % 91 %     4 Minute Liters of Oxygen 0 L     5 Minute Oxygen Saturation % 90 %     5 Minute Liters of Oxygen 0 L     6 Minute Oxygen Saturation % 91 %     6 Minute Liters of Oxygen 0 L     2 Minute Post Oxygen Saturation % 95 %     2 Minute Post Liters of Oxygen 0 L           Oxygen Initial Assessment:  Oxygen Initial Assessment - 04/23/20 1453      Home Oxygen   Home Oxygen Device None    Sleep Oxygen Prescription None    Home Exercise Oxygen Prescription None    Home Resting Oxygen Prescription None      Initial 6 min Walk   Oxygen Used None      Program Oxygen Prescription   Program  Oxygen Prescription None      Intervention   Short Term Goals To learn and understand importance of monitoring SPO2 with pulse oximeter and demonstrate accurate use of the pulse oximeter.;To learn and understand importance of maintaining oxygen saturations>88%;To learn and demonstrate proper pursed lip breathing techniques or other breathing techniques.;To learn and demonstrate proper use of respiratory medications    Long  Term Goals Verbalizes importance of monitoring SPO2 with pulse oximeter and return demonstration;Maintenance of O2 saturations>88%;Exhibits proper breathing techniques, such as pursed lip breathing or other method taught during program session;Compliance with respiratory medication;Demonstrates proper use of MDI's           Oxygen Re-Evaluation:   Oxygen Discharge (Final Oxygen Re-Evaluation):   Initial Exercise Prescription:  Initial Exercise Prescription - 04/23/20 1500      Date of Initial Exercise RX and Referring Provider   Date 04/23/20     Referring Provider Ottie Glazier MD      Treadmill   MPH 1.6    Grade 0.5    Minutes 15    METs 2.32      REL-XR   Level 2    Minutes 15    METs 2.2      T5 Nustep   Level 1    SPM 80    Minutes 15    METs 2.2      Prescription Details   Frequency (times per week) 2      Intensity   THRR 40-80% of Max Heartrate 100-129    Ratings of Perceived Exertion 11-13    Perceived Dyspnea 0-4      Progression   Progression Continue to progress workloads to maintain intensity without signs/symptoms of physical distress.      Resistance Training   Training Prescription Yes    Weight 3 lb    Reps 10-15           Perform Capillary Blood Glucose checks as needed.  Exercise Prescription Changes:   Exercise Prescription Changes    Row Name 04/23/20 1400             Response to Exercise   Blood Pressure (Admit) 128/72       Blood Pressure (Exercise) 162/68       Blood Pressure (Exit) 130/70       Heart Rate (Admit) 71 bpm       Heart Rate (Exercise) 106 bpm       Heart Rate (Exit) 72 bpm       Oxygen Saturation (Admit) 94 %       Oxygen Saturation (Exercise) 90 %       Oxygen Saturation (Exit) 95 %       Rating of Perceived Exertion (Exercise) 11       Perceived Dyspnea (Exercise) 0       Symptoms none       Comments walk test results       Duration Progress to 30 minutes of  aerobic without signs/symptoms of physical distress         Resistance Training   Weight 3 lb       Reps 10-15         Treadmill   MPH 1.6       Grade 0.5       Minutes 15       METs 2.32         REL-XR   Level 2       Minutes 15  METs 2.2         T5 Nustep   Level 1       SPM 80       Minutes 15       METs 2.2              Exercise Comments:   Exercise Goals and Review:   Exercise Goals    Row Name 04/23/20 1449             Exercise Goals   Increase Physical Activity Yes       Intervention Provide advice, education, support and counseling about  physical activity/exercise needs.;Develop an individualized exercise prescription for aerobic and resistive training based on initial evaluation findings, risk stratification, comorbidities and participant's personal goals.       Expected Outcomes Short Term: Attend rehab on a regular basis to increase amount of physical activity.;Long Term: Add in home exercise to make exercise part of routine and to increase amount of physical activity.;Long Term: Exercising regularly at least 3-5 days a week.       Increase Strength and Stamina Yes       Intervention Provide advice, education, support and counseling about physical activity/exercise needs.;Develop an individualized exercise prescription for aerobic and resistive training based on initial evaluation findings, risk stratification, comorbidities and participant's personal goals.       Expected Outcomes Short Term: Increase workloads from initial exercise prescription for resistance, speed, and METs.;Short Term: Perform resistance training exercises routinely during rehab and add in resistance training at home;Long Term: Improve cardiorespiratory fitness, muscular endurance and strength as measured by increased METs and functional capacity ( )       Able to understand and use rate of perceived exertion (RPE) scale Yes       Intervention Provide education and explanation on how to use RPE scale       Expected Outcomes Short Term: Able to use RPE daily in rehab to express subjective intensity level;Long Term:  Able to use RPE to guide intensity level when exercising independently       Able to understand and use Dyspnea scale Yes       Intervention Provide education and explanation on how to use Dyspnea scale       Expected Outcomes Short Term: Able to use Dyspnea scale daily in rehab to express subjective sense of shortness of breath during exertion;Long Term: Able to use Dyspnea scale to guide intensity level when exercising independently       Knowledge  and understanding of Target Heart Rate Range (THRR) Yes       Intervention Provide education and explanation of THRR including how the numbers were predicted and where they are located for reference       Expected Outcomes Short Term: Able to state/look up THRR;Short Term: Able to use daily as guideline for intensity in rehab;Long Term: Able to use THRR to govern intensity when exercising independently       Able to check pulse independently Yes       Intervention Provide education and demonstration on how to check pulse in carotid and radial arteries.;Review the importance of being able to check your own pulse for safety during independent exercise       Expected Outcomes Short Term: Able to explain why pulse checking is important during independent exercise;Long Term: Able to check pulse independently and accurately       Understanding of Exercise Prescription Yes       Intervention Provide  education, explanation, and written materials on patient's individual exercise prescription       Expected Outcomes Short Term: Able to explain program exercise prescription;Long Term: Able to explain home exercise prescription to exercise independently              Exercise Goals Re-Evaluation :   Discharge Exercise Prescription (Final Exercise Prescription Changes):  Exercise Prescription Changes - 04/23/20 1400      Response to Exercise   Blood Pressure (Admit) 128/72    Blood Pressure (Exercise) 162/68    Blood Pressure (Exit) 130/70    Heart Rate (Admit) 71 bpm    Heart Rate (Exercise) 106 bpm    Heart Rate (Exit) 72 bpm    Oxygen Saturation (Admit) 94 %    Oxygen Saturation (Exercise) 90 %    Oxygen Saturation (Exit) 95 %    Rating of Perceived Exertion (Exercise) 11    Perceived Dyspnea (Exercise) 0    Symptoms none    Comments walk test results    Duration Progress to 30 minutes of  aerobic without signs/symptoms of physical distress      Resistance Training   Weight 3 lb    Reps  10-15      Treadmill   MPH 1.6    Grade 0.5    Minutes 15    METs 2.32      REL-XR   Level 2    Minutes 15    METs 2.2      T5 Nustep   Level 1    SPM 80    Minutes 15    METs 2.2           Nutrition:  Target Goals: Understanding of nutrition guidelines, daily intake of sodium '1500mg'$ , cholesterol '200mg'$ , calories 30% from fat and 7% or less from saturated fats, daily to have 5 or more servings of fruits and vegetables.  Education: Controlling Sodium/Reading Food Labels -Group verbal and written material supporting the discussion of sodium use in heart healthy nutrition. Review and explanation with models, verbal and written materials for utilization of the food label.   Education: General Nutrition Guidelines/Fats and Fiber: -Group instruction provided by verbal, written material, models and posters to present the general guidelines for heart healthy nutrition. Gives an explanation and review of dietary fats and fiber.   Biometrics:  Pre Biometrics - 04/23/20 1450      Pre Biometrics   Height 5' 7.5" (1.715 m)    Weight 184 lb 4.8 oz (83.6 kg)    BMI (Calculated) 28.42    Single Leg Stand 4.7 seconds            Nutrition Therapy Plan and Nutrition Goals:   Nutrition Assessments:  Nutrition Assessments - 04/23/20 1511      MEDFICTS Scores   Pre Score 69           MEDIFICTS Score Key:          ?70 Need to make dietary changes          40-70 Heart Healthy Diet         ? 40 Therapeutic Level Cholesterol Diet  Nutrition Goals Re-Evaluation:   Nutrition Goals Discharge (Final Nutrition Goals Re-Evaluation):   Psychosocial: Target Goals: Acknowledge presence or absence of significant depression and/or stress, maximize coping skills, provide positive support system. Participant is able to verbalize types and ability to use techniques and skills needed for reducing stress and depression.   Education: Depression - Provides group verbal  and written  instruction on the correlation between heart/lung disease and depressed mood, treatment options, and the stigmas associated with seeking treatment.   Education: Sleep Hygiene -Provides group verbal and written instruction about how sleep can affect your health.  Define sleep hygiene, discuss sleep cycles and impact of sleep habits. Review good sleep hygiene tips.    Education: Stress and Anxiety: - Provides group verbal and written instruction about the health risks of elevated stress and causes of high stress.  Discuss the correlation between heart/lung disease and anxiety and treatment options. Review healthy ways to manage with stress and anxiety.   Initial Review & Psychosocial Screening:  Initial Psych Review & Screening - 04/22/20 1351      Initial Review   Current issues with None Identified;Current Sleep Concerns      Family Dynamics   Good Support System? Yes    Comments He can look to his wife for support. Sometimes he has trouble sleeping at night.      Barriers   Psychosocial barriers to participate in program The patient should benefit from training in stress management and relaxation.;There are no identifiable barriers or psychosocial needs.      Screening Interventions   Interventions Encouraged to exercise;To provide support and resources with identified psychosocial needs;Provide feedback about the scores to participant    Expected Outcomes Short Term goal: Utilizing psychosocial counselor, staff and physician to assist with identification of specific Stressors or current issues interfering with healing process. Setting desired goal for each stressor or current issue identified.;Short Term goal: Identification and review with participant of any Quality of Life or Depression concerns found by scoring the questionnaire.;Long Term Goal: Stressors or current issues are controlled or eliminated.;Long Term goal: The participant improves quality of Life and PHQ9 Scores as seen by  post scores and/or verbalization of changes           Quality of Life Scores:  Scores of 19 and below usually indicate a poorer quality of life in these areas.  A difference of  2-3 points is a clinically meaningful difference.  A difference of 2-3 points in the total score of the Quality of Life Index has been associated with significant improvement in overall quality of life, self-image, physical symptoms, and general health in studies assessing change in quality of life.  PHQ-9: Recent Review Flowsheet Data    Depression screen [facility] 2/9 04/23/2020   Decreased Interest 0   Down, Depressed, Hopeless 0   PHQ - 2 Score 0   Altered sleeping 2   Tired, decreased energy 1   Change in appetite 0   Feeling bad or failure about yourself  0   Trouble concentrating 0   Moving slowly or fidgety/restless 0   Suicidal thoughts 0   PHQ-9 Score 3   Difficult doing work/chores Not difficult at all     Interpretation of Total Score  Total Score Depression Severity:  1-4 = Minimal depression, 5-9 = Mild depression, 10-14 = Moderate depression, 15-19 = Moderately severe depression, 20-27 = Severe depression   Psychosocial Evaluation and Intervention:  Psychosocial Evaluation - 04/22/20 1353      Psychosocial Evaluation & Interventions   Interventions Relaxation education;Stress management education;Encouraged to exercise with the program and follow exercise prescription    Comments He can look to his wife for support. Sometimes he has trouble sleeping at night.    Expected Outcomes Short: Exercise regularly to support mental health and notify staff of any changes. Long: maintain  mental health and well being through teaching of rehab or prescribed medications independently.    Continue Psychosocial Services  Follow up required by staff           Psychosocial Re-Evaluation:   Psychosocial Discharge (Final Psychosocial Re-Evaluation):   Education: Education Goals: Education classes will  be provided on a weekly basis, covering required topics. Participant will state understanding/return demonstration of topics presented.  Learning Barriers/Preferences:  Learning Barriers/Preferences - 04/22/20 1346      Learning Barriers/Preferences   Learning Barriers Hearing    Learning Preferences None           General Pulmonary Education Topics:  Infection Prevention: - Provides verbal and written material to individual with discussion of infection control including proper hand washing and proper equipment cleaning during exercise session.   Pulmonary Rehab from 04/22/2020 in [facility] Cardiac and Pulmonary Rehab  Date 04/22/20  Educator [facility]  Instruction Review Code 1- Verbalizes Understanding      Falls Prevention: - Provides verbal and written material to individual with discussion of falls prevention and safety.   Pulmonary Rehab from 04/22/2020 in [facility] Cardiac and Pulmonary Rehab  Date 04/22/20  Educator [facility]  Instruction Review Code 1- Verbalizes Understanding      Chronic Lung Diseases: - Group verbal and written instruction to review updates, respiratory medications, advancements in procedures and treatments. Discuss use of supplemental oxygen including available portable oxygen systems, continuous and intermittent flow rates, concentrators, personal use and safety guidelines. Review proper use of inhaler and spacers. Provide informative websites for self-education.    Energy Conservation: - Provide group verbal and written instruction for methods to conserve energy, plan and organize activities. Instruct on pacing techniques, use of adaptive equipment and posture/positioning to relieve shortness of breath.   Triggers and Exacerbations: - Group verbal and written instruction to review types of environmental triggers and ways to prevent exacerbations. Discuss weather changes, air quality and the benefits of nasal washing. Review warning signs and symptoms to help prevent  infections. Discuss techniques for effective airway clearance, coughing, and vibrations.   AED/CPR: - Group verbal and written instruction with the use of models to demonstrate the basic use of the AED with the basic ABC's of resuscitation.   Anatomy and Physiology of the Lungs: - Group verbal and written instruction with the use of models to provide basic lung anatomy and physiology related to function, structure and complications of lung disease.   Anatomy & Physiology of the Heart: - Group verbal and written instruction and models provide basic cardiac anatomy and physiology, with the coronary electrical and arterial systems. Review of Valvular disease and Heart Failure   Cardiac Medications: - Group verbal and written instruction to review commonly prescribed medications for heart disease. Reviews the medication, class of the drug, and side effects.   Other: -Provides group and verbal instruction on various topics (see comments)   Knowledge Questionnaire Score:    Core Components/Risk Factors/Patient Goals at Admission:  Personal Goals and Risk Factors at Admission - 04/23/20 1451      Core Components/Risk Factors/Patient Goals on Admission    Weight Management Yes;Weight Loss    Intervention Weight Management: Develop a combined nutrition and exercise program designed to reach desired caloric intake, while maintaining appropriate intake of nutrient and fiber, sodium and fats, and appropriate energy expenditure required for the weight goal.;Weight Management: Provide education and appropriate resources to help participant work on and attain dietary goals.;Weight Management/Obesity: Establish reasonable short term and long term  weight goals.    Admit Weight 184 lb 4.8 oz (83.6 kg)    Goal Weight: Short Term 179 lb (81.2 kg)    Goal Weight: Long Term 174 lb (78.9 kg)    Expected Outcomes Short Term: Continue to assess and modify interventions until short term weight is  achieved;Long Term: Adherence to nutrition and physical activity/exercise program aimed toward attainment of established weight goal;Weight Loss: Understanding of general recommendations for a balanced deficit meal plan, which promotes 1-2 lb weight loss per week and includes a negative energy balance of [phone] kcal/d;Understanding recommendations for meals to include 15-35% energy as protein, 25-35% energy from fat, 35-60% energy from carbohydrates, less than $RemoveB'200mg'VOeJVacA$  of dietary cholesterol, 20-35 gm of total fiber daily;Understanding of distribution of calorie intake throughout the day with the consumption of 4-5 meals/snacks    Improve shortness of breath with ADL's Yes    Intervention Provide education, individualized exercise plan and daily activity instruction to help decrease symptoms of SOB with activities of daily living.    Expected Outcomes Short Term: Improve cardiorespiratory fitness to achieve a reduction of symptoms when performing ADLs;Long Term: Be able to perform more ADLs without symptoms or delay the onset of symptoms    Diabetes Yes   Diet controlled   Intervention Provide education about signs/symptoms and action to take for hypo/hyperglycemia.;Provide education about proper nutrition, including hydration, and aerobic/resistive exercise prescription along with prescribed medications to achieve blood glucose in normal ranges: Fasting glucose 65-99 mg/dL    Expected Outcomes Short Term: Participant verbalizes understanding of the signs/symptoms and immediate care of hyper/hypoglycemia, proper foot care and importance of medication, aerobic/resistive exercise and nutrition plan for blood glucose control.;Long Term: Attainment of HbA1C < 7%.    Hypertension Yes    Intervention Provide education on lifestyle modifcations including regular physical activity/exercise, weight management, moderate sodium restriction and increased consumption of fresh fruit, vegetables, and low fat dairy, alcohol  moderation, and smoking cessation.;Monitor prescription use compliance.    Expected Outcomes Short Term: Continued assessment and intervention until BP is < 140/36mm HG in hypertensive participants. < 130/3mm HG in hypertensive participants with diabetes, heart failure or chronic kidney disease.;Long Term: Maintenance of blood pressure at goal levels.    Lipids Yes    Intervention Provide education and support for participant on nutrition & aerobic/resistive exercise along with prescribed medications to achieve LDL '70mg'$ , HDL >$Remo'40mg'XyCVt$ .    Expected Outcomes Short Term: Participant states understanding of desired cholesterol values and is compliant with medications prescribed. Participant is following exercise prescription and nutrition guidelines.;Long Term: Cholesterol controlled with medications as prescribed, with individualized exercise RX and with personalized nutrition plan. Value goals: LDL < $Rem'70mg'JkeC$ , HDL > 40 mg.           Education:Diabetes - Individual verbal and written instruction to review signs/symptoms of diabetes, desired ranges of glucose level fasting, after meals and with exercise. Acknowledge that pre and post exercise glucose checks will be done for 3 sessions at entry of program.   Education: Know Your Numbers and Risk Factors: -Group verbal and written instruction about important numbers in your health.  Discussion of what are risk factors and how they play a role in the disease process.  Review of Cholesterol, Blood Pressure, Diabetes, and BMI and the role they play in your overall health.   Core Components/Risk Factors/Patient Goals Review:    Core Components/Risk Factors/Patient Goals at Discharge (Final Review):    ITP Comments:  ITP Comments    Row  Name 04/22/20 1344 04/23/20 1437         ITP Comments Virtual Visit completed. Patient informed on EP and RD appointment and 6 Minute walk test. Patient also informed of patient health questionnaires on My Chart. Patient  Verbalizes understanding. Visit diagnosis can be found in [facility] 03/07/2020. Completed 6MWT and gym orientation. Initial ITP created and sent for review to Dr. Emily Filbert, Medical Director.             Comments: Initial ITP

## 2020-04-29 ENCOUNTER — Encounter: Payer: Medicare Other | Attending: Pulmonary Disease | Admitting: *Deleted

## 2020-04-29 ENCOUNTER — Other Ambulatory Visit: Payer: Self-pay

## 2020-04-29 DIAGNOSIS — J439 Emphysema, unspecified: Secondary | ICD-10-CM | POA: Diagnosis not present

## 2020-04-29 NOTE — Progress Notes (Signed)
Daily Session Note  Patient Details  Name: Brian Callahan MRN: [id number] Date of Birth: 07/06/1941 Referring Provider:     Pulmonary Rehab from 04/23/2020 in [facility]  Referring Provider Aleskerov, Fuad MD      Encounter Date: 04/29/2020  Check In:  Session Check In - 04/29/20 1117      Check-In   Supervising physician immediately available to respond to emergencies See telemetry face sheet for immediately available ER MD    Location [facility]    Staff Present Meredith Craven, RN BSN;Joseph Hood RCP,RRT,BSRT;Kara Langdon, MS Exercise Physiologist;Amanda Sommer, BA, ACSM CEP, Exercise Physiologist;Kelly Hayes, BS, ACSM CEP, Exercise Physiologist    Virtual Visit No    Medication changes reported     No    Fall or balance concerns reported    No    Warm-up and Cool-down Performed on first and last piece of equipment    Resistance Training Performed Yes    VAD Patient? No    PAD/SET Patient? No      Pain Assessment   Currently in Pain? No/denies              Social History   Tobacco Use  Smoking Status Former Smoker  . Packs/day: 0.25  . Years: 10.00  . Pack years: 2.50  . Types: Cigarettes  . Quit date: 10/12/1990  . Years since quitting: 29.5  Smokeless Tobacco Never Used  Tobacco Comment   Quit 20 years ago    Goals Met:  Independence with exercise equipment Exercise tolerated well No report of cardiac concerns or symptoms Strength training completed today  Goals Unmet:  Not Applicable  Comments: First full day of exercise!  Patient was oriented to gym and equipment including functions, settings, policies, and procedures.  Patient's individual exercise prescription and treatment plan were reviewed.  All starting workloads were established based on the results of the 6 minute walk test done at initial orientation visit.  The plan for exercise progression was also introduced and progression will be customized based  on patient's performance and goals.    Dr. Mark Miller is Medical Director for [facility] and [facility]. 

## 2020-05-01 ENCOUNTER — Encounter: Payer: Self-pay | Admitting: *Deleted

## 2020-05-01 ENCOUNTER — Encounter: Payer: Medicare Other | Admitting: *Deleted

## 2020-05-01 ENCOUNTER — Other Ambulatory Visit: Payer: Self-pay

## 2020-05-01 DIAGNOSIS — J439 Emphysema, unspecified: Secondary | ICD-10-CM

## 2020-05-01 NOTE — Progress Notes (Signed)
Pulmonary Individual Treatment Plan  Patient Details  Name: Brian Callahan MRN: [id number] Date of Birth: 11/19/1940 Referring Provider:     Pulmonary Rehab from 04/23/2020 in [facility] Cardiac and Pulmonary Rehab  Referring Provider Ottie Glazier MD      Initial Encounter Date:    Pulmonary Rehab from 04/23/2020 in [facility] Cardiac and Pulmonary Rehab  Date 04/23/20      Visit Diagnosis: Pulmonary emphysema, unspecified emphysema type ([place])  Patient's Home Medications on Admission:  Current Outpatient Medications:  .  acetaminophen (TYLENOL) 500 MG tablet, Take 500-1,000 mg by mouth every 6 (six) hours as needed (for headache/minor pain.)., Disp: , Rfl:  .  betamethasone dipropionate (DIPROLENE) 0.05 % cream, Apply 1 application topically 2 (two) times daily as needed (for psoriasis)., Disp: , Rfl:  .  clobetasol ointment (TEMOVATE) 2.11 %, Apply 1 application topically daily as needed (psoriasis). , Disp: , Rfl:  .  enalapril (VASOTEC) 5 MG tablet, Take 1 tablet (5 mg total) by mouth 2 (two) times daily., Disp: , Rfl:  .  ezetimibe (ZETIA) 10 MG tablet, Take 10 mg by mouth daily., Disp: , Rfl:  .  finasteride (PROSCAR) 5 MG tablet, TAKE 1 TABLET BY MOUTH  DAILY (Patient not taking: Reported on 02/21/2020), Disp: 90 tablet, Rfl: 3 .  Fluticasone-Salmeterol (ADVAIR DISKUS) 250-50 MCG/DOSE AEPB, Inhale 1 puff 2 (two) times daily into the lungs.  (Patient not taking: Reported on 04/22/2020), Disp: , Rfl:  .  Melatonin 10 MG TABS, Take 10 mg by mouth at bedtime as needed (sleeo)., Disp: , Rfl:  .  Meth-Hyo-M Bl-Na Phos-Ph Sal (URIBEL) 118 MG CAPS, Take 1 capsule (118 mg total) by mouth 3 (three) times daily as needed (Urinary frequency, urgency, burning). (Patient not taking: Reported on 04/22/2020), Disp: 15 capsule, Rfl: 0 .  metoprolol succinate (TOPROL-XL) 50 MG 24 hr tablet, Take 50 mg by mouth daily. Take with or immediately following a meal., Disp: , Rfl:  .  Misc Natural Products (TART CHERRY  ADVANCED PO), Take 12 mg by mouth daily. , Disp: , Rfl:  .  Multiple Vitamins-Minerals (PRESERVISION AREDS 2 PO), Take 2 capsules by mouth daily. , Disp: , Rfl:  .  omeprazole (PRILOSEC) 20 MG capsule, Take 20 mg by mouth daily., Disp: , Rfl:  .  tamsulosin (FLOMAX) 0.4 MG CAPS capsule, TAKE 1 CAPSULE BY MOUTH  TWICE DAILY (Patient not taking: Reported on 04/22/2020), Disp: 180 capsule, Rfl: 3  Past Medical History: Past Medical History:  Diagnosis Date  . Anemia    pt denies 01/18/20  . BP (high blood pressure) 05/23/2015  . BPH with obstruction/lower urinary tract symptoms   . COPD (chronic obstructive pulmonary disease) ([place])   . Diabetes mellitus without complication (HCC)    diet controlled  . Dyspnea   . Encounter for long-term (current) use of other high-risk medications   . GERD (gastroesophageal reflux disease)    gastritis  . GI bleed   . Gout   . H/O hernia repair mid-90s   x2  . History of elevated PSA   . History of [facility] spotted fever   . HLD (hyperlipidemia)   . HTN (hypertension)   . Hyperglycemia   . Hyperuricemia   . Incomplete bladder emptying   . Macular degeneration   . Obesity   . Psoriasis   . Psoriasis   . Urinary frequency     Tobacco Use: Social History   Tobacco Use  Smoking Status Former Smoker  .  Packs/day: 0.25  . Years: 10.00  . Pack years: 2.50  . Types: Cigarettes  . Quit date: 10/12/1990  . Years since quitting: 29.5  Smokeless Tobacco Never Used  Tobacco Comment   Quit 20 years ago    Labs: Recent Review Scientist, physiological    Labs for ITP Cardiac and Pulmonary Rehab Latest Ref Rng & Units 04/11/2012   Hemoglobin A1c 4.2 - 6.3 % 4.8       Pulmonary Assessment Scores:  Pulmonary Assessment Scores    Row Name 04/23/20 1454         ADL UCSD   ADL Phase Entry     SOB Score total 7     Rest 0     Walk 0     Stairs 3     Bath 0     Dress 0     Shop 0       CAT Score   CAT Score 10       mMRC Score   mMRC  Score 1            UCSD: Self-administered rating of dyspnea associated with activities of daily living (ADLs) 6-point scale (0 = "not at all" to 5 = "maximal or unable to do because of breathlessness")  Scoring Scores range from 0 to 120.  Minimally important difference is 5 units  CAT: CAT can identify the health impairment of COPD patients and is better correlated with disease progression.  CAT has a scoring range of zero to 40. The CAT score is classified into four groups of low (less than 10), medium (10 - 20), high (21-30) and very high (31-40) based on the impact level of disease on health status. A CAT score over 10 suggests significant symptoms.  A worsening CAT score could be explained by an exacerbation, poor medication adherence, poor inhaler technique, or progression of COPD or comorbid conditions.  CAT MCID is 2 points  mMRC: mMRC (Modified Medical Research Council) Dyspnea Scale is used to assess the degree of baseline functional disability in patients of respiratory disease due to dyspnea. No minimal important difference is established. A decrease in score of 1 point or greater is considered a positive change.   Pulmonary Function Assessment:  Pulmonary Function Assessment - 04/22/20 1345      Breath   Shortness of Breath Limiting activity;Yes           Exercise Target Goals: Exercise Program Goal: Individual exercise prescription set using results from initial 6 min walk test and THRR while considering  patient's activity barriers and safety.   Exercise Prescription Goal: Initial exercise prescription builds to 30-45 minutes a day of aerobic activity, 2-3 days per week.  Home exercise guidelines will be given to patient during program as part of exercise prescription that the participant will acknowledge.  Education: Aerobic Exercise & Resistance Training: - Gives group verbal and written instruction on the various components of exercise. Focuses on aerobic and  resistive training programs and the benefits of this training and how to safely progress through these programs..   Education: Exercise & Equipment Safety: - Individual verbal instruction and demonstration of equipment use and safety with use of the equipment.   Pulmonary Rehab from 04/22/2020 in [facility] Cardiac and Pulmonary Rehab  Date 04/22/20  Educator [facility]  Instruction Review Code 1- Verbalizes Understanding      Education: Exercise Physiology & General Exercise Guidelines: - Group verbal and written instruction with models to review the exercise  physiology of the cardiovascular system and associated critical values. Provides general exercise guidelines with specific guidelines to those with heart or lung disease.    Education: Flexibility, Balance, Mind/Body Relaxation: Provides group verbal/written instruction on the benefits of flexibility and balance training, including mind/body exercise modes such as yoga, pilates and tai chi.  Demonstration and skill practice provided.   Activity Barriers & Risk Stratification:  Activity Barriers & Cardiac Risk Stratification - 04/23/20 1450      Activity Barriers & Cardiac Risk Stratification   Activity Barriers Arthritis   Both knees and hips          6 Minute Walk:  6 Minute Walk    Row Name 04/23/20 1437         6 Minute Walk   Phase Initial     Distance 990 feet     Walk Time 6 minutes     # of Rest Breaks 0     MPH 1.8     METS 2.28     RPE 11     Perceived Dyspnea  0     VO2 Peak 8     Symptoms No     Resting HR 71 bpm     Resting BP 128/72     Resting Oxygen Saturation  94 %     Exercise Oxygen Saturation  during 6 min walk 90 %     Max Ex. HR 106 bpm     Max Ex. BP 162/68     2 Minute Post BP 130/70       Interval HR   1 Minute HR 94     2 Minute HR 85     3 Minute HR 100     4 Minute HR 106     5 Minute HR 92     6 Minute HR 104     2 Minute Post HR 73     Interval Heart Rate? Yes       Interval Oxygen     Interval Oxygen? Yes     Baseline Oxygen Saturation % 94 %     1 Minute Oxygen Saturation % 92 %     1 Minute Liters of Oxygen 0 L  RA     2 Minute Oxygen Saturation % 90 %     2 Minute Liters of Oxygen 0 L     3 Minute Oxygen Saturation % 90 %     3 Minute Liters of Oxygen 0 L     4 Minute Oxygen Saturation % 91 %     4 Minute Liters of Oxygen 0 L     5 Minute Oxygen Saturation % 90 %     5 Minute Liters of Oxygen 0 L     6 Minute Oxygen Saturation % 91 %     6 Minute Liters of Oxygen 0 L     2 Minute Post Oxygen Saturation % 95 %     2 Minute Post Liters of Oxygen 0 L           Oxygen Initial Assessment:  Oxygen Initial Assessment - 04/23/20 1453      Home Oxygen   Home Oxygen Device None    Sleep Oxygen Prescription None    Home Exercise Oxygen Prescription None    Home Resting Oxygen Prescription None      Initial 6 min Walk   Oxygen Used None      Program Oxygen Prescription   Program  Oxygen Prescription None      Intervention   Short Term Goals To learn and understand importance of monitoring SPO2 with pulse oximeter and demonstrate accurate use of the pulse oximeter.;To learn and understand importance of maintaining oxygen saturations>88%;To learn and demonstrate proper pursed lip breathing techniques or other breathing techniques.;To learn and demonstrate proper use of respiratory medications    Long  Term Goals Verbalizes importance of monitoring SPO2 with pulse oximeter and return demonstration;Maintenance of O2 saturations>88%;Exhibits proper breathing techniques, such as pursed lip breathing or other method taught during program session;Compliance with respiratory medication;Demonstrates proper use of MDI's           Oxygen Re-Evaluation:  Oxygen Re-Evaluation    Row Name 04/29/20 1123             Program Oxygen Prescription   Program Oxygen Prescription None         Home Oxygen   Sleep Oxygen Prescription None       Home Exercise Oxygen  Prescription None       Home Resting Oxygen Prescription None         Goals/Expected Outcomes   Short Term Goals To learn and understand importance of monitoring SPO2 with pulse oximeter and demonstrate accurate use of the pulse oximeter.;To learn and understand importance of maintaining oxygen saturations>88%;To learn and demonstrate proper pursed lip breathing techniques or other breathing techniques.;To learn and demonstrate proper use of respiratory medications       Long  Term Goals Verbalizes importance of monitoring SPO2 with pulse oximeter and return demonstration;Maintenance of O2 saturations>88%;Exhibits proper breathing techniques, such as pursed lip breathing or other method taught during program session;Compliance with respiratory medication;Demonstrates proper use of MDI's       Comments Reviewed PLB technique with pt.  Talked about how it works and it's importance in maintaining their exercise saturations.       Goals/Expected Outcomes Short: Become more profiecient at using PLB.   Long: Become independent at using PLB.              Oxygen Discharge (Final Oxygen Re-Evaluation):  Oxygen Re-Evaluation - 04/29/20 1123      Program Oxygen Prescription   Program Oxygen Prescription None      Home Oxygen   Sleep Oxygen Prescription None    Home Exercise Oxygen Prescription None    Home Resting Oxygen Prescription None      Goals/Expected Outcomes   Short Term Goals To learn and understand importance of monitoring SPO2 with pulse oximeter and demonstrate accurate use of the pulse oximeter.;To learn and understand importance of maintaining oxygen saturations>88%;To learn and demonstrate proper pursed lip breathing techniques or other breathing techniques.;To learn and demonstrate proper use of respiratory medications    Long  Term Goals Verbalizes importance of monitoring SPO2 with pulse oximeter and return demonstration;Maintenance of O2 saturations>88%;Exhibits proper breathing  techniques, such as pursed lip breathing or other method taught during program session;Compliance with respiratory medication;Demonstrates proper use of MDI's    Comments Reviewed PLB technique with pt.  Talked about how it works and it's importance in maintaining their exercise saturations.    Goals/Expected Outcomes Short: Become more profiecient at using PLB.   Long: Become independent at using PLB.           Initial Exercise Prescription:  Initial Exercise Prescription - 04/23/20 1500      Date of Initial Exercise RX and Referring Provider   Date 04/23/20    Referring Provider Ottie Glazier  MD      Treadmill   MPH 1.6    Grade 0.5    Minutes 15    METs 2.32      REL-XR   Level 2    Minutes 15    METs 2.2      T5 Nustep   Level 1    SPM 80    Minutes 15    METs 2.2      Prescription Details   Frequency (times per week) 2      Intensity   THRR 40-80% of Max Heartrate 100-129    Ratings of Perceived Exertion 11-13    Perceived Dyspnea 0-4      Progression   Progression Continue to progress workloads to maintain intensity without signs/symptoms of physical distress.      Resistance Training   Training Prescription Yes    Weight 3 lb    Reps 10-15           Perform Capillary Blood Glucose checks as needed.  Exercise Prescription Changes:  Exercise Prescription Changes    Row Name 04/23/20 1400             Response to Exercise   Blood Pressure (Admit) 128/72       Blood Pressure (Exercise) 162/68       Blood Pressure (Exit) 130/70       Heart Rate (Admit) 71 bpm       Heart Rate (Exercise) 106 bpm       Heart Rate (Exit) 72 bpm       Oxygen Saturation (Admit) 94 %       Oxygen Saturation (Exercise) 90 %       Oxygen Saturation (Exit) 95 %       Rating of Perceived Exertion (Exercise) 11       Perceived Dyspnea (Exercise) 0       Symptoms none       Comments walk test results       Duration Progress to 30 minutes of  aerobic without  signs/symptoms of physical distress         Resistance Training   Weight 3 lb       Reps 10-15         Treadmill   MPH 1.6       Grade 0.5       Minutes 15       METs 2.32         REL-XR   Level 2       Minutes 15       METs 2.2         T5 Nustep   Level 1       SPM 80       Minutes 15       METs 2.2              Exercise Comments:   Exercise Goals and Review:  Exercise Goals    Row Name 04/23/20 1449             Exercise Goals   Increase Physical Activity Yes       Intervention Provide advice, education, support and counseling about physical activity/exercise needs.;Develop an individualized exercise prescription for aerobic and resistive training based on initial evaluation findings, risk stratification, comorbidities and participant's personal goals.       Expected Outcomes Short Term: Attend rehab on a regular basis to increase amount of physical activity.;Long Term: Add in home exercise  to make exercise part of routine and to increase amount of physical activity.;Long Term: Exercising regularly at least 3-5 days a week.       Increase Strength and Stamina Yes       Intervention Provide advice, education, support and counseling about physical activity/exercise needs.;Develop an individualized exercise prescription for aerobic and resistive training based on initial evaluation findings, risk stratification, comorbidities and participant's personal goals.       Expected Outcomes Short Term: Increase workloads from initial exercise prescription for resistance, speed, and METs.;Short Term: Perform resistance training exercises routinely during rehab and add in resistance training at home;Long Term: Improve cardiorespiratory fitness, muscular endurance and strength as measured by increased METs and functional capacity (6MWT)       Able to understand and use rate of perceived exertion (RPE) scale Yes       Intervention Provide education and explanation on how to use RPE  scale       Expected Outcomes Short Term: Able to use RPE daily in rehab to express subjective intensity level;Long Term:  Able to use RPE to guide intensity level when exercising independently       Able to understand and use Dyspnea scale Yes       Intervention Provide education and explanation on how to use Dyspnea scale       Expected Outcomes Short Term: Able to use Dyspnea scale daily in rehab to express subjective sense of shortness of breath during exertion;Long Term: Able to use Dyspnea scale to guide intensity level when exercising independently       Knowledge and understanding of Target Heart Rate Range (THRR) Yes       Intervention Provide education and explanation of THRR including how the numbers were predicted and where they are located for reference       Expected Outcomes Short Term: Able to state/look up THRR;Short Term: Able to use daily as guideline for intensity in rehab;Long Term: Able to use THRR to govern intensity when exercising independently       Able to check pulse independently Yes       Intervention Provide education and demonstration on how to check pulse in carotid and radial arteries.;Review the importance of being able to check your own pulse for safety during independent exercise       Expected Outcomes Short Term: Able to explain why pulse checking is important during independent exercise;Long Term: Able to check pulse independently and accurately       Understanding of Exercise Prescription Yes       Intervention Provide education, explanation, and written materials on patient's individual exercise prescription       Expected Outcomes Short Term: Able to explain program exercise prescription;Long Term: Able to explain home exercise prescription to exercise independently              Exercise Goals Re-Evaluation :  Exercise Goals Re-Evaluation    Row Name 04/29/20 1120             Exercise Goal Re-Evaluation   Exercise Goals Review Increase Physical  Activity;Able to understand and use rate of perceived exertion (RPE) scale;Knowledge and understanding of Target Heart Rate Range (THRR);Understanding of Exercise Prescription;Increase Strength and Stamina;Able to understand and use Dyspnea scale;Able to check pulse independently       Comments Reviewed RPE and dyspnea scales, THR and program prescription with pt today.  Pt voiced understanding and was given a copy of goals to take home.  Expected Outcomes Short: Use RPE daily to regulate intensity. Long: Follow program prescription in THR.              Discharge Exercise Prescription (Final Exercise Prescription Changes):  Exercise Prescription Changes - 04/23/20 1400      Response to Exercise   Blood Pressure (Admit) 128/72    Blood Pressure (Exercise) 162/68    Blood Pressure (Exit) 130/70    Heart Rate (Admit) 71 bpm    Heart Rate (Exercise) 106 bpm    Heart Rate (Exit) 72 bpm    Oxygen Saturation (Admit) 94 %    Oxygen Saturation (Exercise) 90 %    Oxygen Saturation (Exit) 95 %    Rating of Perceived Exertion (Exercise) 11    Perceived Dyspnea (Exercise) 0    Symptoms none    Comments walk test results    Duration Progress to 30 minutes of  aerobic without signs/symptoms of physical distress      Resistance Training   Weight 3 lb    Reps 10-15      Treadmill   MPH 1.6    Grade 0.5    Minutes 15    METs 2.32      REL-XR   Level 2    Minutes 15    METs 2.2      T5 Nustep   Level 1    SPM 80    Minutes 15    METs 2.2           Nutrition:  Target Goals: Understanding of nutrition guidelines, daily intake of sodium <1530m, cholesterol <2038m calories 30% from fat and 7% or less from saturated fats, daily to have 5 or more servings of fruits and vegetables.  Education: Controlling Sodium/Reading Food Labels -Group verbal and written material supporting the discussion of sodium use in heart healthy nutrition. Review and explanation with models, verbal and  written materials for utilization of the food label.   Education: General Nutrition Guidelines/Fats and Fiber: -Group instruction provided by verbal, written material, models and posters to present the general guidelines for heart healthy nutrition. Gives an explanation and review of dietary fats and fiber.   Biometrics:  Pre Biometrics - 04/23/20 1450      Pre Biometrics   Height 5' 7.5" (1.715 m)    Weight 184 lb 4.8 oz (83.6 kg)    BMI (Calculated) 28.42    Single Leg Stand 4.7 seconds            Nutrition Therapy Plan and Nutrition Goals:   Nutrition Assessments:  Nutrition Assessments - 04/23/20 1511      MEDFICTS Scores   Pre Score 69           MEDIFICTS Score Key:          ?70 Need to make dietary changes          40-70 Heart Healthy Diet         ? 40 Therapeutic Level Cholesterol Diet  Nutrition Goals Re-Evaluation:   Nutrition Goals Discharge (Final Nutrition Goals Re-Evaluation):   Psychosocial: Target Goals: Acknowledge presence or absence of significant depression and/or stress, maximize coping skills, provide positive support system. Participant is able to verbalize types and ability to use techniques and skills needed for reducing stress and depression.   Education: Depression - Provides group verbal and written instruction on the correlation between heart/lung disease and depressed mood, treatment options, and the stigmas associated with seeking treatment.   Education: Sleep Hygiene -Provides  group verbal and written instruction about how sleep can affect your health.  Define sleep hygiene, discuss sleep cycles and impact of sleep habits. Review good sleep hygiene tips.    Education: Stress and Anxiety: - Provides group verbal and written instruction about the health risks of elevated stress and causes of high stress.  Discuss the correlation between heart/lung disease and anxiety and treatment options. Review healthy ways to manage with stress  and anxiety.   Initial Review & Psychosocial Screening:  Initial Psych Review & Screening - 04/22/20 1351      Initial Review   Current issues with None Identified;Current Sleep Concerns      Family Dynamics   Good Support System? Yes    Comments He can look to his wife for support. Sometimes he has trouble sleeping at night.      Barriers   Psychosocial barriers to participate in program The patient should benefit from training in stress management and relaxation.;There are no identifiable barriers or psychosocial needs.      Screening Interventions   Interventions Encouraged to exercise;To provide support and resources with identified psychosocial needs;Provide feedback about the scores to participant    Expected Outcomes Short Term goal: Utilizing psychosocial counselor, staff and physician to assist with identification of specific Stressors or current issues interfering with healing process. Setting desired goal for each stressor or current issue identified.;Short Term goal: Identification and review with participant of any Quality of Life or Depression concerns found by scoring the questionnaire.;Long Term Goal: Stressors or current issues are controlled or eliminated.;Long Term goal: The participant improves quality of Life and PHQ9 Scores as seen by post scores and/or verbalization of changes           Quality of Life Scores:  Scores of 19 and below usually indicate a poorer quality of life in these areas.  A difference of  2-3 points is a clinically meaningful difference.  A difference of 2-3 points in the total score of the Quality of Life Index has been associated with significant improvement in overall quality of life, self-image, physical symptoms, and general health in studies assessing change in quality of life.  PHQ-9: Recent Review Flowsheet Data    Depression screen [facility] 2/9 04/23/2020   Decreased Interest 0   Down, Depressed, Hopeless 0   PHQ - 2 Score 0   Altered  sleeping 2   Tired, decreased energy 1   Change in appetite 0   Feeling bad or failure about yourself  0   Trouble concentrating 0   Moving slowly or fidgety/restless 0   Suicidal thoughts 0   PHQ-9 Score 3   Difficult doing work/chores Not difficult at all     Interpretation of Total Score  Total Score Depression Severity:  1-4 = Minimal depression, 5-9 = Mild depression, 10-14 = Moderate depression, 15-19 = Moderately severe depression, 20-27 = Severe depression   Psychosocial Evaluation and Intervention:  Psychosocial Evaluation - 04/22/20 1353      Psychosocial Evaluation & Interventions   Interventions Relaxation education;Stress management education;Encouraged to exercise with the program and follow exercise prescription    Comments He can look to his wife for support. Sometimes he has trouble sleeping at night.    Expected Outcomes Short: Exercise regularly to support mental health and notify staff of any changes. Long: maintain mental health and well being through teaching of rehab or prescribed medications independently.    Continue Psychosocial Services  Follow up required by staff  Psychosocial Re-Evaluation:   Psychosocial Discharge (Final Psychosocial Re-Evaluation):   Education: Education Goals: Education classes will be provided on a weekly basis, covering required topics. Participant will state understanding/return demonstration of topics presented.  Learning Barriers/Preferences:  Learning Barriers/Preferences - 04/22/20 1346      Learning Barriers/Preferences   Learning Barriers Hearing    Learning Preferences None           General Pulmonary Education Topics:  Infection Prevention: - Provides verbal and written material to individual with discussion of infection control including proper hand washing and proper equipment cleaning during exercise session.   Pulmonary Rehab from 04/22/2020 in [facility] Cardiac and Pulmonary Rehab  Date 04/22/20    Educator [facility]  Instruction Review Code 1- Verbalizes Understanding      Falls Prevention: - Provides verbal and written material to individual with discussion of falls prevention and safety.   Pulmonary Rehab from 04/22/2020 in [facility] Cardiac and Pulmonary Rehab  Date 04/22/20  Educator [facility]  Instruction Review Code 1- Verbalizes Understanding      Chronic Lung Diseases: - Group verbal and written instruction to review updates, respiratory medications, advancements in procedures and treatments. Discuss use of supplemental oxygen including available portable oxygen systems, continuous and intermittent flow rates, concentrators, personal use and safety guidelines. Review proper use of inhaler and spacers. Provide informative websites for self-education.    Energy Conservation: - Provide group verbal and written instruction for methods to conserve energy, plan and organize activities. Instruct on pacing techniques, use of adaptive equipment and posture/positioning to relieve shortness of breath.   Triggers and Exacerbations: - Group verbal and written instruction to review types of environmental triggers and ways to prevent exacerbations. Discuss weather changes, air quality and the benefits of nasal washing. Review warning signs and symptoms to help prevent infections. Discuss techniques for effective airway clearance, coughing, and vibrations.   AED/CPR: - Group verbal and written instruction with the use of models to demonstrate the basic use of the AED with the basic ABC's of resuscitation.   Anatomy and Physiology of the Lungs: - Group verbal and written instruction with the use of models to provide basic lung anatomy and physiology related to function, structure and complications of lung disease.   Anatomy & Physiology of the Heart: - Group verbal and written instruction and models provide basic cardiac anatomy and physiology, with the coronary electrical and arterial systems. Review  of Valvular disease and Heart Failure   Cardiac Medications: - Group verbal and written instruction to review commonly prescribed medications for heart disease. Reviews the medication, class of the drug, and side effects.   Other: -Provides group and verbal instruction on various topics (see comments)   Knowledge Questionnaire Score:  Knowledge Questionnaire Score - 04/29/20 1226      Knowledge Questionnaire Score   Pre Score 13/18: Oxygen, nutrition            Core Components/Risk Factors/Patient Goals at Admission:  Personal Goals and Risk Factors at Admission - 04/23/20 1451      Core Components/Risk Factors/Patient Goals on Admission    Weight Management Yes;Weight Loss    Intervention Weight Management: Develop a combined nutrition and exercise program designed to reach desired caloric intake, while maintaining appropriate intake of nutrient and fiber, sodium and fats, and appropriate energy expenditure required for the weight goal.;Weight Management: Provide education and appropriate resources to help participant work on and attain dietary goals.;Weight Management/Obesity: Establish reasonable short term and long term weight goals.  Admit Weight 184 lb 4.8 oz (83.6 kg)    Goal Weight: Short Term 179 lb (81.2 kg)    Goal Weight: Long Term 174 lb (78.9 kg)    Expected Outcomes Short Term: Continue to assess and modify interventions until short term weight is achieved;Long Term: Adherence to nutrition and physical activity/exercise program aimed toward attainment of established weight goal;Weight Loss: Understanding of general recommendations for a balanced deficit meal plan, which promotes 1-2 lb weight loss per week and includes a negative energy balance of [phone] kcal/d;Understanding recommendations for meals to include 15-35% energy as protein, 25-35% energy from fat, 35-60% energy from carbohydrates, less than 277m of dietary cholesterol, 20-35 gm of total fiber  daily;Understanding of distribution of calorie intake throughout the day with the consumption of 4-5 meals/snacks    Improve shortness of breath with ADL's Yes    Intervention Provide education, individualized exercise plan and daily activity instruction to help decrease symptoms of SOB with activities of daily living.    Expected Outcomes Short Term: Improve cardiorespiratory fitness to achieve a reduction of symptoms when performing ADLs;Long Term: Be able to perform more ADLs without symptoms or delay the onset of symptoms    Diabetes Yes   Diet controlled   Intervention Provide education about signs/symptoms and action to take for hypo/hyperglycemia.;Provide education about proper nutrition, including hydration, and aerobic/resistive exercise prescription along with prescribed medications to achieve blood glucose in normal ranges: Fasting glucose 65-99 mg/dL    Expected Outcomes Short Term: Participant verbalizes understanding of the signs/symptoms and immediate care of hyper/hypoglycemia, proper foot care and importance of medication, aerobic/resistive exercise and nutrition plan for blood glucose control.;Long Term: Attainment of HbA1C < 7%.    Hypertension Yes    Intervention Provide education on lifestyle modifcations including regular physical activity/exercise, weight management, moderate sodium restriction and increased consumption of fresh fruit, vegetables, and low fat dairy, alcohol moderation, and smoking cessation.;Monitor prescription use compliance.    Expected Outcomes Short Term: Continued assessment and intervention until BP is < 140/930mHG in hypertensive participants. < 130/8021mG in hypertensive participants with diabetes, heart failure or chronic kidney disease.;Long Term: Maintenance of blood pressure at goal levels.    Lipids Yes    Intervention Provide education and support for participant on nutrition & aerobic/resistive exercise along with prescribed medications to achieve  LDL <21m66mDL >40mg8m Expected Outcomes Short Term: Participant states understanding of desired cholesterol values and is compliant with medications prescribed. Participant is following exercise prescription and nutrition guidelines.;Long Term: Cholesterol controlled with medications as prescribed, with individualized exercise RX and with personalized nutrition plan. Value goals: LDL < 21mg,52m > 40 mg.           Education:Diabetes - Individual verbal and written instruction to review signs/symptoms of diabetes, desired ranges of glucose level fasting, after meals and with exercise. Acknowledge that pre and post exercise glucose checks will be done for 3 sessions at entry of program.   Education: Know Your Numbers and Risk Factors: -Group verbal and written instruction about important numbers in your health.  Discussion of what are risk factors and how they play a role in the disease process.  Review of Cholesterol, Blood Pressure, Diabetes, and BMI and the role they play in your overall health.   Core Components/Risk Factors/Patient Goals Review:    Core Components/Risk Factors/Patient Goals at Discharge (Final Review):    ITP Comments:  ITP Comments    Row Name 04/22/20 1344 04/23/20 1437  04/29/20 1120 05/01/20 0526     ITP Comments Virtual Visit completed. Patient informed on EP and RD appointment and 6 Minute walk test. Patient also informed of patient health questionnaires on My Chart. Patient Verbalizes understanding. Visit diagnosis can be found in [facility] 03/07/2020. Completed 6MWT and gym orientation. Initial ITP created and sent for review to Dr. Emily Filbert, Medical Director. First full day of exercise!  Patient was oriented to gym and equipment including functions, settings, policies, and procedures.  Patient's individual exercise prescription and treatment plan were reviewed.  All starting workloads were established based on the results of the 6 minute walk test done at initial  orientation visit.  The plan for exercise progression was also introduced and progression will be customized based on patient's performance and goals. 30 Day review completed. Medical Director ITP review done, changes made as directed, and signed approval by Medical Director.           Comments:

## 2020-05-01 NOTE — Progress Notes (Signed)
Daily Session Note  Patient Details  Name: KODY BRANDL MRN: [id number] Date of Birth: [birth year] Referring Provider:     Pulmonary Rehab from 04/23/2020 in [facility] Cardiac and Pulmonary Rehab  Referring Provider Ottie Glazier MD      Encounter Date: 05/01/2020  Check In:  Session Check In - 05/01/20 1105      Check-In   Supervising physician immediately available to respond to emergencies See telemetry face sheet for immediately available ER MD    Location [facility]-Cardiac & Pulmonary Rehab    Staff Present Renita Papa, RN BSN;Joseph Lou Miner, [place] Exercise Physiologist;Melissa Caiola RDN, LDN    Virtual Visit No    Medication changes reported     No    Fall or balance concerns reported    No    Warm-up and Cool-down Performed on first and last piece of equipment    Resistance Training Performed Yes    VAD Patient? No    PAD/SET Patient? No      Pain Assessment   Currently in Pain? No/denies              Social History   Tobacco Use  Smoking Status Former Smoker  . Packs/day: 0.25  . Years: 10.00  . Pack years: 2.50  . Types: Cigarettes  . Quit date: 10/12/1990  . Years since quitting: 29.5  Smokeless Tobacco Never Used  Tobacco Comment   Quit 20 years ago    Goals Met:  Independence with exercise equipment Exercise tolerated well No report of cardiac concerns or symptoms Strength training completed today  Goals Unmet:  Not Applicable  Comments: Pt able to follow exercise prescription today without complaint.  Will continue to monitor for progression.    Dr. Emily Filbert is Medical Director for [place] and [facility].

## 2020-05-06 ENCOUNTER — Encounter: Payer: Medicare Other | Admitting: *Deleted

## 2020-05-06 ENCOUNTER — Other Ambulatory Visit: Payer: Self-pay

## 2020-05-06 DIAGNOSIS — J439 Emphysema, unspecified: Secondary | ICD-10-CM | POA: Diagnosis not present

## 2020-05-06 NOTE — Progress Notes (Signed)
Daily Session Note  Patient Details  Name: Brian Callahan MRN: [id number] Date of Birth: 30-Jul-1940 Referring Provider:     Pulmonary Rehab from 04/23/2020 in [facility] Cardiac and Pulmonary Rehab  Referring Provider Ottie Glazier MD      Encounter Date: 05/06/2020  Check In:  Session Check In - 05/06/20 1055      Check-In   Supervising physician immediately available to respond to emergencies See telemetry face sheet for immediately available ER MD    Location [facility]-Cardiac & Pulmonary Rehab    Staff Present Renita Papa, RN BSN;Joseph Lou Miner, [place] Exercise Physiologist;Amanda Oletta Darter, [place], ACSM CEP, Exercise Physiologist    Virtual Visit No    Medication changes reported     No    Fall or balance concerns reported    No    Warm-up and Cool-down Performed on first and last piece of equipment    Resistance Training Performed Yes    VAD Patient? No    PAD/SET Patient? No      Pain Assessment   Currently in Pain? No/denies              Social History   Tobacco Use  Smoking Status Former Smoker  . Packs/day: 0.25  . Years: 10.00  . Pack years: 2.50  . Types: Cigarettes  . Quit date: 10/12/1990  . Years since quitting: 29.5  Smokeless Tobacco Never Used  Tobacco Comment   Quit 20 years ago    Goals Met:  Independence with exercise equipment Exercise tolerated well No report of cardiac concerns or symptoms Strength training completed today  Goals Unmet:  Not Applicable  Comments: Pt able to follow exercise prescription today without complaint.  Will continue to monitor for progression.    Dr. Emily Filbert is Medical Director for [name] and [facility].

## 2020-05-08 ENCOUNTER — Other Ambulatory Visit: Payer: Self-pay

## 2020-05-08 DIAGNOSIS — J439 Emphysema, unspecified: Secondary | ICD-10-CM

## 2020-05-08 NOTE — Progress Notes (Signed)
Completed initial RD Evaluation.  

## 2020-05-13 ENCOUNTER — Telehealth: Payer: Self-pay | Admitting: *Deleted

## 2020-05-13 ENCOUNTER — Encounter: Payer: Self-pay | Admitting: *Deleted

## 2020-05-13 DIAGNOSIS — J439 Emphysema, unspecified: Secondary | ICD-10-CM

## 2020-05-13 NOTE — Telephone Encounter (Signed)
Brian Callahan called to let us know that he would like to discharge from the program.  He felt embarrassed and that he could not do the program.  We will discharge him at this time.

## 2020-05-13 NOTE — Progress Notes (Signed)
Pulmonary Individual Treatment Plan  Patient Details  Name: Brian Callahan MRN: [id number] Date of Birth: 11/19/1940 Referring Provider:     Pulmonary Rehab from 04/23/2020 in [facility] Cardiac and Pulmonary Rehab  Referring Provider Ottie Glazier MD      Initial Encounter Date:    Pulmonary Rehab from 04/23/2020 in [facility] Cardiac and Pulmonary Rehab  Date 04/23/20      Visit Diagnosis: Pulmonary emphysema, unspecified emphysema type ([place])  Patient's Home Medications on Admission:  Current Outpatient Medications:  .  acetaminophen (TYLENOL) 500 MG tablet, Take 500-1,000 mg by mouth every 6 (six) hours as needed (for headache/minor pain.)., Disp: , Rfl:  .  betamethasone dipropionate (DIPROLENE) 0.05 % cream, Apply 1 application topically 2 (two) times daily as needed (for psoriasis)., Disp: , Rfl:  .  clobetasol ointment (TEMOVATE) 2.11 %, Apply 1 application topically daily as needed (psoriasis). , Disp: , Rfl:  .  enalapril (VASOTEC) 5 MG tablet, Take 1 tablet (5 mg total) by mouth 2 (two) times daily., Disp: , Rfl:  .  ezetimibe (ZETIA) 10 MG tablet, Take 10 mg by mouth daily., Disp: , Rfl:  .  finasteride (PROSCAR) 5 MG tablet, TAKE 1 TABLET BY MOUTH  DAILY (Patient not taking: Reported on 02/21/2020), Disp: 90 tablet, Rfl: 3 .  Fluticasone-Salmeterol (ADVAIR DISKUS) 250-50 MCG/DOSE AEPB, Inhale 1 puff 2 (two) times daily into the lungs.  (Patient not taking: Reported on 04/22/2020), Disp: , Rfl:  .  Melatonin 10 MG TABS, Take 10 mg by mouth at bedtime as needed (sleeo)., Disp: , Rfl:  .  Meth-Hyo-M Bl-Na Phos-Ph Sal (URIBEL) 118 MG CAPS, Take 1 capsule (118 mg total) by mouth 3 (three) times daily as needed (Urinary frequency, urgency, burning). (Patient not taking: Reported on 04/22/2020), Disp: 15 capsule, Rfl: 0 .  metoprolol succinate (TOPROL-XL) 50 MG 24 hr tablet, Take 50 mg by mouth daily. Take with or immediately following a meal., Disp: , Rfl:  .  Misc Natural Products (TART CHERRY  ADVANCED PO), Take 12 mg by mouth daily. , Disp: , Rfl:  .  Multiple Vitamins-Minerals (PRESERVISION AREDS 2 PO), Take 2 capsules by mouth daily. , Disp: , Rfl:  .  omeprazole (PRILOSEC) 20 MG capsule, Take 20 mg by mouth daily., Disp: , Rfl:  .  tamsulosin (FLOMAX) 0.4 MG CAPS capsule, TAKE 1 CAPSULE BY MOUTH  TWICE DAILY (Patient not taking: Reported on 04/22/2020), Disp: 180 capsule, Rfl: 3  Past Medical History: Past Medical History:  Diagnosis Date  . Anemia    pt denies 01/18/20  . BP (high blood pressure) 05/23/2015  . BPH with obstruction/lower urinary tract symptoms   . COPD (chronic obstructive pulmonary disease) ([place])   . Diabetes mellitus without complication (HCC)    diet controlled  . Dyspnea   . Encounter for long-term (current) use of other high-risk medications   . GERD (gastroesophageal reflux disease)    gastritis  . GI bleed   . Gout   . H/O hernia repair mid-90s   x2  . History of elevated PSA   . History of [facility] spotted fever   . HLD (hyperlipidemia)   . HTN (hypertension)   . Hyperglycemia   . Hyperuricemia   . Incomplete bladder emptying   . Macular degeneration   . Obesity   . Psoriasis   . Psoriasis   . Urinary frequency     Tobacco Use: Social History   Tobacco Use  Smoking Status Former Smoker  .  Packs/day: 0.25  . Years: 10.00  . Pack years: 2.50  . Types: Cigarettes  . Quit date: 10/12/1990  . Years since quitting: 29.6  Smokeless Tobacco Never Used  Tobacco Comment   Quit 20 years ago    Labs: Recent Review Scientist, physiological    Labs for ITP Cardiac and Pulmonary Rehab Latest Ref Rng & Units 04/11/2012   Hemoglobin A1c 4.2 - 6.3 % 4.8       Pulmonary Assessment Scores:  Pulmonary Assessment Scores    Row Name 04/23/20 1454         ADL UCSD   ADL Phase Entry     SOB Score total 7     Rest 0     Walk 0     Stairs 3     Bath 0     Dress 0     Shop 0       CAT Score   CAT Score 10       mMRC Score   mMRC  Score 1            UCSD: Self-administered rating of dyspnea associated with activities of daily living (ADLs) 6-point scale (0 = "not at all" to 5 = "maximal or unable to do because of breathlessness")  Scoring Scores range from 0 to 120.  Minimally important difference is 5 units  CAT: CAT can identify the health impairment of COPD patients and is better correlated with disease progression.  CAT has a scoring range of zero to 40. The CAT score is classified into four groups of low (less than 10), medium (10 - 20), high (21-30) and very high (31-40) based on the impact level of disease on health status. A CAT score over 10 suggests significant symptoms.  A worsening CAT score could be explained by an exacerbation, poor medication adherence, poor inhaler technique, or progression of COPD or comorbid conditions.  CAT MCID is 2 points  mMRC: mMRC (Modified Medical Research Council) Dyspnea Scale is used to assess the degree of baseline functional disability in patients of respiratory disease due to dyspnea. No minimal important difference is established. A decrease in score of 1 point or greater is considered a positive change.   Pulmonary Function Assessment:  Pulmonary Function Assessment - 04/22/20 1345      Breath   Shortness of Breath Limiting activity;Yes           Exercise Target Goals: Exercise Program Goal: Individual exercise prescription set using results from initial 6 min walk test and THRR while considering  patient's activity barriers and safety.   Exercise Prescription Goal: Initial exercise prescription builds to 30-45 minutes a day of aerobic activity, 2-3 days per week.  Home exercise guidelines will be given to patient during program as part of exercise prescription that the participant will acknowledge.  Education: Aerobic Exercise & Resistance Training: - Gives group verbal and written instruction on the various components of exercise. Focuses on aerobic and  resistive training programs and the benefits of this training and how to safely progress through these programs..   Education: Exercise & Equipment Safety: - Individual verbal instruction and demonstration of equipment use and safety with use of the equipment.   Pulmonary Rehab from 05/01/2020 in [facility] Cardiac and Pulmonary Rehab  Date 04/22/20  Educator [facility]  Instruction Review Code 1- Verbalizes Understanding      Education: Exercise Physiology & General Exercise Guidelines: - Group verbal and written instruction with models to review the exercise  physiology of the cardiovascular system and associated critical values. Provides general exercise guidelines with specific guidelines to those with heart or lung disease.    Education: Flexibility, Balance, Mind/Body Relaxation: Provides group verbal/written instruction on the benefits of flexibility and balance training, including mind/body exercise modes such as yoga, pilates and tai chi.  Demonstration and skill practice provided.   Activity Barriers & Risk Stratification:  Activity Barriers & Cardiac Risk Stratification - 04/23/20 1450      Activity Barriers & Cardiac Risk Stratification   Activity Barriers Arthritis   Both knees and hips          6 Minute Walk:  6 Minute Walk    Row Name 04/23/20 1437         6 Minute Walk   Phase Initial     Distance 990 feet     Walk Time 6 minutes     # of Rest Breaks 0     MPH 1.8     METS 2.28     RPE 11     Perceived Dyspnea  0     VO2 Peak 8     Symptoms No     Resting HR 71 bpm     Resting BP 128/72     Resting Oxygen Saturation  94 %     Exercise Oxygen Saturation  during 6 min walk 90 %     Max Ex. HR 106 bpm     Max Ex. BP 162/68     2 Minute Post BP 130/70       Interval HR   1 Minute HR 94     2 Minute HR 85     3 Minute HR 100     4 Minute HR 106     5 Minute HR 92     6 Minute HR 104     2 Minute Post HR 73     Interval Heart Rate? Yes       Interval Oxygen     Interval Oxygen? Yes     Baseline Oxygen Saturation % 94 %     1 Minute Oxygen Saturation % 92 %     1 Minute Liters of Oxygen 0 L  RA     2 Minute Oxygen Saturation % 90 %     2 Minute Liters of Oxygen 0 L     3 Minute Oxygen Saturation % 90 %     3 Minute Liters of Oxygen 0 L     4 Minute Oxygen Saturation % 91 %     4 Minute Liters of Oxygen 0 L     5 Minute Oxygen Saturation % 90 %     5 Minute Liters of Oxygen 0 L     6 Minute Oxygen Saturation % 91 %     6 Minute Liters of Oxygen 0 L     2 Minute Post Oxygen Saturation % 95 %     2 Minute Post Liters of Oxygen 0 L           Oxygen Initial Assessment:  Oxygen Initial Assessment - 04/23/20 1453      Home Oxygen   Home Oxygen Device None    Sleep Oxygen Prescription None    Home Exercise Oxygen Prescription None    Home Resting Oxygen Prescription None      Initial 6 min Walk   Oxygen Used None      Program Oxygen Prescription   Program  Oxygen Prescription None      Intervention   Short Term Goals To learn and understand importance of monitoring SPO2 with pulse oximeter and demonstrate accurate use of the pulse oximeter.;To learn and understand importance of maintaining oxygen saturations>88%;To learn and demonstrate proper pursed lip breathing techniques or other breathing techniques.;To learn and demonstrate proper use of respiratory medications    Long  Term Goals Verbalizes importance of monitoring SPO2 with pulse oximeter and return demonstration;Maintenance of O2 saturations>88%;Exhibits proper breathing techniques, such as pursed lip breathing or other method taught during program session;Compliance with respiratory medication;Demonstrates proper use of MDI's           Oxygen Re-Evaluation:  Oxygen Re-Evaluation    Row Name 04/29/20 1123             Program Oxygen Prescription   Program Oxygen Prescription None         Home Oxygen   Sleep Oxygen Prescription None       Home Exercise Oxygen  Prescription None       Home Resting Oxygen Prescription None         Goals/Expected Outcomes   Short Term Goals To learn and understand importance of monitoring SPO2 with pulse oximeter and demonstrate accurate use of the pulse oximeter.;To learn and understand importance of maintaining oxygen saturations>88%;To learn and demonstrate proper pursed lip breathing techniques or other breathing techniques.;To learn and demonstrate proper use of respiratory medications       Long  Term Goals Verbalizes importance of monitoring SPO2 with pulse oximeter and return demonstration;Maintenance of O2 saturations>88%;Exhibits proper breathing techniques, such as pursed lip breathing or other method taught during program session;Compliance with respiratory medication;Demonstrates proper use of MDI's       Comments Reviewed PLB technique with pt.  Talked about how it works and it's importance in maintaining their exercise saturations.       Goals/Expected Outcomes Short: Become more profiecient at using PLB.   Long: Become independent at using PLB.              Oxygen Discharge (Final Oxygen Re-Evaluation):  Oxygen Re-Evaluation - 04/29/20 1123      Program Oxygen Prescription   Program Oxygen Prescription None      Home Oxygen   Sleep Oxygen Prescription None    Home Exercise Oxygen Prescription None    Home Resting Oxygen Prescription None      Goals/Expected Outcomes   Short Term Goals To learn and understand importance of monitoring SPO2 with pulse oximeter and demonstrate accurate use of the pulse oximeter.;To learn and understand importance of maintaining oxygen saturations>88%;To learn and demonstrate proper pursed lip breathing techniques or other breathing techniques.;To learn and demonstrate proper use of respiratory medications    Long  Term Goals Verbalizes importance of monitoring SPO2 with pulse oximeter and return demonstration;Maintenance of O2 saturations>88%;Exhibits proper breathing  techniques, such as pursed lip breathing or other method taught during program session;Compliance with respiratory medication;Demonstrates proper use of MDI's    Comments Reviewed PLB technique with pt.  Talked about how it works and it's importance in maintaining their exercise saturations.    Goals/Expected Outcomes Short: Become more profiecient at using PLB.   Long: Become independent at using PLB.           Initial Exercise Prescription:  Initial Exercise Prescription - 04/23/20 1500      Date of Initial Exercise RX and Referring Provider   Date 04/23/20    Referring Provider Ottie Glazier  MD      Treadmill   MPH 1.6    Grade 0.5    Minutes 15    METs 2.32      REL-XR   Level 2    Minutes 15    METs 2.2      T5 Nustep   Level 1    SPM 80    Minutes 15    METs 2.2      Prescription Details   Frequency (times per week) 2      Intensity   THRR 40-80% of Max Heartrate 100-129    Ratings of Perceived Exertion 11-13    Perceived Dyspnea 0-4      Progression   Progression Continue to progress workloads to maintain intensity without signs/symptoms of physical distress.      Resistance Training   Training Prescription Yes    Weight 3 lb    Reps 10-15           Perform Capillary Blood Glucose checks as needed.  Exercise Prescription Changes:  Exercise Prescription Changes    Row Name 04/23/20 1400 05/02/20 0800           Response to Exercise   Blood Pressure (Admit) 128/72 122/64      Blood Pressure (Exercise) 162/68 160/66      Blood Pressure (Exit) 130/70 126/64      Heart Rate (Admit) 71 bpm 71 bpm      Heart Rate (Exercise) 106 bpm 115 bpm      Heart Rate (Exit) 72 bpm 76 bpm      Oxygen Saturation (Admit) 94 % 95 %      Oxygen Saturation (Exercise) 90 % 90 %      Oxygen Saturation (Exit) 95 % 95 %      Rating of Perceived Exertion (Exercise) 11 11      Perceived Dyspnea (Exercise) 0 0      Symptoms none none      Comments walk test results  --      Duration Progress to 30 minutes of  aerobic without signs/symptoms of physical distress Progress to 30 minutes of  aerobic without signs/symptoms of physical distress      Intensity -- THRR unchanged        Progression   Progression -- Continue to progress workloads to maintain intensity without signs/symptoms of physical distress.      Average METs -- 2.3        Resistance Training   Training Prescription -- Yes      Weight 3 lb 3 lb      Reps 10-15 10-15        Interval Training   Interval Training -- No        Treadmill   MPH 1.6 1.6      Grade 0.5 0.5      Minutes 15 15      METs 2.32 2.32        REL-XR   Level 2 --      Minutes 15 --      METs 2.2 --        T5 Nustep   Level 1 1      SPM 80 --      Minutes 15 15      METs 2.2 --             Exercise Comments:   Exercise Goals and Review:  Exercise Goals    Row Name 04/23/20 1449  Exercise Goals   Increase Physical Activity Yes       Intervention Provide advice, education, support and counseling about physical activity/exercise needs.;Develop an individualized exercise prescription for aerobic and resistive training based on initial evaluation findings, risk stratification, comorbidities and participant's personal goals.       Expected Outcomes Short Term: Attend rehab on a regular basis to increase amount of physical activity.;Long Term: Add in home exercise to make exercise part of routine and to increase amount of physical activity.;Long Term: Exercising regularly at least 3-5 days a week.       Increase Strength and Stamina Yes       Intervention Provide advice, education, support and counseling about physical activity/exercise needs.;Develop an individualized exercise prescription for aerobic and resistive training based on initial evaluation findings, risk stratification, comorbidities and participant's personal goals.       Expected Outcomes Short Term: Increase workloads from  initial exercise prescription for resistance, speed, and METs.;Short Term: Perform resistance training exercises routinely during rehab and add in resistance training at home;Long Term: Improve cardiorespiratory fitness, muscular endurance and strength as measured by increased METs and functional capacity (6MWT)       Able to understand and use rate of perceived exertion (RPE) scale Yes       Intervention Provide education and explanation on how to use RPE scale       Expected Outcomes Short Term: Able to use RPE daily in rehab to express subjective intensity level;Long Term:  Able to use RPE to guide intensity level when exercising independently       Able to understand and use Dyspnea scale Yes       Intervention Provide education and explanation on how to use Dyspnea scale       Expected Outcomes Short Term: Able to use Dyspnea scale daily in rehab to express subjective sense of shortness of breath during exertion;Long Term: Able to use Dyspnea scale to guide intensity level when exercising independently       Knowledge and understanding of Target Heart Rate Range (THRR) Yes       Intervention Provide education and explanation of THRR including how the numbers were predicted and where they are located for reference       Expected Outcomes Short Term: Able to state/look up THRR;Short Term: Able to use daily as guideline for intensity in rehab;Long Term: Able to use THRR to govern intensity when exercising independently       Able to check pulse independently Yes       Intervention Provide education and demonstration on how to check pulse in carotid and radial arteries.;Review the importance of being able to check your own pulse for safety during independent exercise       Expected Outcomes Short Term: Able to explain why pulse checking is important during independent exercise;Long Term: Able to check pulse independently and accurately       Understanding of Exercise Prescription Yes        Intervention Provide education, explanation, and written materials on patient's individual exercise prescription       Expected Outcomes Short Term: Able to explain program exercise prescription;Long Term: Able to explain home exercise prescription to exercise independently              Exercise Goals Re-Evaluation :  Exercise Goals Re-Evaluation    Row Name 04/29/20 1120 05/02/20 0809           Exercise Goal Re-Evaluation   Exercise Goals Review  Increase Physical Activity;Able to understand and use rate of perceived exertion (RPE) scale;Knowledge and understanding of Target Heart Rate Range (THRR);Understanding of Exercise Prescription;Increase Strength and Stamina;Able to understand and use Dyspnea scale;Able to check pulse independently Increase Physical Activity;Increase Strength and Stamina;Understanding of Exercise Prescription      Comments Reviewed RPE and dyspnea scales, THR and program prescription with pt today.  Pt voiced understanding and was given a copy of goals to take home. Hunt has completed his first two full days of exercise.  We will continue to monitor his progress.      Expected Outcomes Short: Use RPE daily to regulate intensity. Long: Follow program prescription in THR. short: Continue to attend regularly Long; Conitnue to follow program prescription             Discharge Exercise Prescription (Final Exercise Prescription Changes):  Exercise Prescription Changes - 05/02/20 0800      Response to Exercise   Blood Pressure (Admit) 122/64    Blood Pressure (Exercise) 160/66    Blood Pressure (Exit) 126/64    Heart Rate (Admit) 71 bpm    Heart Rate (Exercise) 115 bpm    Heart Rate (Exit) 76 bpm    Oxygen Saturation (Admit) 95 %    Oxygen Saturation (Exercise) 90 %    Oxygen Saturation (Exit) 95 %    Rating of Perceived Exertion (Exercise) 11    Perceived Dyspnea (Exercise) 0    Symptoms none    Duration Progress to 30 minutes of  aerobic without  signs/symptoms of physical distress    Intensity THRR unchanged      Progression   Progression Continue to progress workloads to maintain intensity without signs/symptoms of physical distress.    Average METs 2.3      Resistance Training   Training Prescription Yes    Weight 3 lb    Reps 10-15      Interval Training   Interval Training No      Treadmill   MPH 1.6    Grade 0.5    Minutes 15    METs 2.32      T5 Nustep   Level 1    Minutes 15           Nutrition:  Target Goals: Understanding of nutrition guidelines, daily intake of sodium <1598m, cholesterol <2024m calories 30% from fat and 7% or less from saturated fats, daily to have 5 or more servings of fruits and vegetables.  Education: Controlling Sodium/Reading Food Labels -Group verbal and written material supporting the discussion of sodium use in heart healthy nutrition. Review and explanation with models, verbal and written materials for utilization of the food label.   Education: General Nutrition Guidelines/Fats and Fiber: -Group instruction provided by verbal, written material, models and posters to present the general guidelines for heart healthy nutrition. Gives an explanation and review of dietary fats and fiber.   Biometrics:  Pre Biometrics - 04/23/20 1450      Pre Biometrics   Height 5' 7.5" (1.715 m)    Weight 184 lb 4.8 oz (83.6 kg)    BMI (Calculated) 28.42    Single Leg Stand 4.7 seconds            Nutrition Therapy Plan and Nutrition Goals:  Nutrition Therapy & Goals - 05/08/20 1435      Nutrition Therapy   Diet Heart healthy, low sodium    Protein (specify units) 65g    Fiber 30 grams    Whole Grain  Foods 3 servings    Saturated Fats 12 max. grams    Fruits and Vegetables 8 servings/day    Sodium 1.5 grams      Personal Nutrition Goals   Nutrition Goal ST: 1  serving fruit at lunch, 1 serving of vegetables at dinner LT: A1C 6.9 - keep below 7, lose 10 pounds, not start  insulin    Comments Caydence reports that he has reduced his candy intake and has replaced it with fruit - milkyway was his favorite. wife is vegan, he will cook seperatley. No breakfast. Lunch: coffee, piece of gingerbread. He reports using butter on gingerbread or peanut butter sandwich. Yogurt or cottage cheese. Dinner: Buys egglands best - 25% less cholesterol. Thomas whole wheat english muffin. Will sometimes have saugage on it. Chili with beef. Rotini with onions, peppers, cheese sauce, and beef. Hamburger patties. Only eats whole wheat buns and pasta. Goulash - with beef. Makes a pot of navy beans every other week. He likes fish but doesnt get it much anymore because he doesn't go out. he likes to try [place] in his chili. Uses extra virgin olive oil when cooking. No added salt when cooking. On the side (1x/week) he will have vegetables like broccoli or cauliflower or carrots. - will sometimes have corn. Buys no salt added beans. Discussed adding in more fruit and vegetables and reducing red meat. discussed some vegetables other than broccoli - directed him to videos how to cut every vegetable and how to slice every fruit.      Intervention Plan   Intervention Prescribe, educate and counsel regarding individualized specific dietary modifications aiming towards targeted core components such as weight, hypertension, lipid management, diabetes, heart failure and other comorbidities.;Nutrition handout(s) given to patient.    Expected Outcomes Short Term Goal: Understand basic principles of dietary content, such as calories, fat, sodium, cholesterol and nutrients.;Short Term Goal: A plan has been developed with personal nutrition goals set during dietitian appointment.;Long Term Goal: Adherence to prescribed nutrition plan.           Nutrition Assessments:  Nutrition Assessments - 04/23/20 1511      MEDFICTS Scores   Pre Score 69           MEDIFICTS Score Key:          ?70 Need to make dietary  changes          40-70 Heart Healthy Diet         ? 40 Therapeutic Level Cholesterol Diet  Nutrition Goals Re-Evaluation:   Nutrition Goals Discharge (Final Nutrition Goals Re-Evaluation):   Psychosocial: Target Goals: Acknowledge presence or absence of significant depression and/or stress, maximize coping skills, provide positive support system. Participant is able to verbalize types and ability to use techniques and skills needed for reducing stress and depression.   Education: Depression - Provides group verbal and written instruction on the correlation between heart/lung disease and depressed mood, treatment options, and the stigmas associated with seeking treatment.   Pulmonary Rehab from 05/01/2020 in [facility] Cardiac and Pulmonary Rehab  Date 05/01/20  Educator [facility]  Instruction Review Code 1- Verbalizes Understanding      Education: Sleep Hygiene -Provides group verbal and written instruction about how sleep can affect your health.  Define sleep hygiene, discuss sleep cycles and impact of sleep habits. Review good sleep hygiene tips.    Education: Stress and Anxiety: - Provides group verbal and written instruction about the health risks of elevated stress and causes of high stress.  Discuss the correlation between heart/lung disease and anxiety and treatment options. Review healthy ways to manage with stress and anxiety.   Pulmonary Rehab from 05/01/2020 in [facility] Cardiac and Pulmonary Rehab  Date 05/01/20  Educator [facility]  Instruction Review Code 1- Verbalizes Understanding      Initial Review & Psychosocial Screening:  Initial Psych Review & Screening - 04/22/20 1351      Initial Review   Current issues with None Identified;Current Sleep Concerns      Family Dynamics   Good Support System? Yes    Comments He can look to his wife for support. Sometimes he has trouble sleeping at night.      Barriers   Psychosocial barriers to participate in program The patient should benefit  from training in stress management and relaxation.;There are no identifiable barriers or psychosocial needs.      Screening Interventions   Interventions Encouraged to exercise;To provide support and resources with identified psychosocial needs;Provide feedback about the scores to participant    Expected Outcomes Short Term goal: Utilizing psychosocial counselor, staff and physician to assist with identification of specific Stressors or current issues interfering with healing process. Setting desired goal for each stressor or current issue identified.;Short Term goal: Identification and review with participant of any Quality of Life or Depression concerns found by scoring the questionnaire.;Long Term Goal: Stressors or current issues are controlled or eliminated.;Long Term goal: The participant improves quality of Life and PHQ9 Scores as seen by post scores and/or verbalization of changes           Quality of Life Scores:  Scores of 19 and below usually indicate a poorer quality of life in these areas.  A difference of  2-3 points is a clinically meaningful difference.  A difference of 2-3 points in the total score of the Quality of Life Index has been associated with significant improvement in overall quality of life, self-image, physical symptoms, and general health in studies assessing change in quality of life.  PHQ-9: Recent Review Flowsheet Data    Depression screen [facility] 2/9 04/23/2020   Decreased Interest 0   Down, Depressed, Hopeless 0   PHQ - 2 Score 0   Altered sleeping 2   Tired, decreased energy 1   Change in appetite 0   Feeling bad or failure about yourself  0   Trouble concentrating 0   Moving slowly or fidgety/restless 0   Suicidal thoughts 0   PHQ-9 Score 3   Difficult doing work/chores Not difficult at all     Interpretation of Total Score  Total Score Depression Severity:  1-4 = Minimal depression, 5-9 = Mild depression, 10-14 = Moderate depression, 15-19 =  Moderately severe depression, 20-27 = Severe depression   Psychosocial Evaluation and Intervention:  Psychosocial Evaluation - 04/22/20 1353      Psychosocial Evaluation & Interventions   Interventions Relaxation education;Stress management education;Encouraged to exercise with the program and follow exercise prescription    Comments He can look to his wife for support. Sometimes he has trouble sleeping at night.    Expected Outcomes Short: Exercise regularly to support mental health and notify staff of any changes. Long: maintain mental health and well being through teaching of rehab or prescribed medications independently.    Continue Psychosocial Services  Follow up required by staff           Psychosocial Re-Evaluation:   Psychosocial Discharge (Final Psychosocial Re-Evaluation):   Education: Education Goals: Education classes will be provided on  a weekly basis, covering required topics. Participant will state understanding/return demonstration of topics presented.  Learning Barriers/Preferences:  Learning Barriers/Preferences - 04/22/20 1346      Learning Barriers/Preferences   Learning Barriers Hearing    Learning Preferences None           General Pulmonary Education Topics:  Infection Prevention: - Provides verbal and written material to individual with discussion of infection control including proper hand washing and proper equipment cleaning during exercise session.   Pulmonary Rehab from 05/01/2020 in [facility] Cardiac and Pulmonary Rehab  Date 04/22/20  Educator [facility]  Instruction Review Code 1- Verbalizes Understanding      Falls Prevention: - Provides verbal and written material to individual with discussion of falls prevention and safety.   Pulmonary Rehab from 05/01/2020 in [facility] Cardiac and Pulmonary Rehab  Date 04/22/20  Educator [facility]  Instruction Review Code 1- Verbalizes Understanding      Chronic Lung Diseases: - Group verbal and written instruction to  review updates, respiratory medications, advancements in procedures and treatments. Discuss use of supplemental oxygen including available portable oxygen systems, continuous and intermittent flow rates, concentrators, personal use and safety guidelines. Review proper use of inhaler and spacers. Provide informative websites for self-education.    Energy Conservation: - Provide group verbal and written instruction for methods to conserve energy, plan and organize activities. Instruct on pacing techniques, use of adaptive equipment and posture/positioning to relieve shortness of breath.   Triggers and Exacerbations: - Group verbal and written instruction to review types of environmental triggers and ways to prevent exacerbations. Discuss weather changes, air quality and the benefits of nasal washing. Review warning signs and symptoms to help prevent infections. Discuss techniques for effective airway clearance, coughing, and vibrations.   AED/CPR: - Group verbal and written instruction with the use of models to demonstrate the basic use of the AED with the basic ABC's of resuscitation.   Anatomy and Physiology of the Lungs: - Group verbal and written instruction with the use of models to provide basic lung anatomy and physiology related to function, structure and complications of lung disease.   Anatomy & Physiology of the Heart: - Group verbal and written instruction and models provide basic cardiac anatomy and physiology, with the coronary electrical and arterial systems. Review of Valvular disease and Heart Failure   Cardiac Medications: - Group verbal and written instruction to review commonly prescribed medications for heart disease. Reviews the medication, class of the drug, and side effects.   Other: -Provides group and verbal instruction on various topics (see comments)   Knowledge Questionnaire Score:  Knowledge Questionnaire Score - 04/29/20 1226      Knowledge Questionnaire  Score   Pre Score 13/18: Oxygen, nutrition            Core Components/Risk Factors/Patient Goals at Admission:  Personal Goals and Risk Factors at Admission - 04/23/20 1451      Core Components/Risk Factors/Patient Goals on Admission    Weight Management Yes;Weight Loss    Intervention Weight Management: Develop a combined nutrition and exercise program designed to reach desired caloric intake, while maintaining appropriate intake of nutrient and fiber, sodium and fats, and appropriate energy expenditure required for the weight goal.;Weight Management: Provide education and appropriate resources to help participant work on and attain dietary goals.;Weight Management/Obesity: Establish reasonable short term and long term weight goals.    Admit Weight 184 lb 4.8 oz (83.6 kg)    Goal Weight: Short Term 179 lb (81.2 kg)  Goal Weight: Long Term 174 lb (78.9 kg)    Expected Outcomes Short Term: Continue to assess and modify interventions until short term weight is achieved;Long Term: Adherence to nutrition and physical activity/exercise program aimed toward attainment of established weight goal;Weight Loss: Understanding of general recommendations for a balanced deficit meal plan, which promotes 1-2 lb weight loss per week and includes a negative energy balance of [id number] kcal/d;Understanding recommendations for meals to include 15-35% energy as protein, 25-35% energy from fat, 35-60% energy from carbohydrates, less than 24m of dietary cholesterol, 20-35 gm of total fiber daily;Understanding of distribution of calorie intake throughout the day with the consumption of 4-5 meals/snacks    Improve shortness of breath with ADL's Yes    Intervention Provide education, individualized exercise plan and daily activity instruction to help decrease symptoms of SOB with activities of daily living.    Expected Outcomes Short Term: Improve cardiorespiratory fitness to achieve a reduction of symptoms when  performing ADLs;Long Term: Be able to perform more ADLs without symptoms or delay the onset of symptoms    Diabetes Yes   Diet controlled   Intervention Provide education about signs/symptoms and action to take for hypo/hyperglycemia.;Provide education about proper nutrition, including hydration, and aerobic/resistive exercise prescription along with prescribed medications to achieve blood glucose in normal ranges: Fasting glucose 65-99 mg/dL    Expected Outcomes Short Term: Participant verbalizes understanding of the signs/symptoms and immediate care of hyper/hypoglycemia, proper foot care and importance of medication, aerobic/resistive exercise and nutrition plan for blood glucose control.;Long Term: Attainment of HbA1C < 7%.    Hypertension Yes    Intervention Provide education on lifestyle modifcations including regular physical activity/exercise, weight management, moderate sodium restriction and increased consumption of fresh fruit, vegetables, and low fat dairy, alcohol moderation, and smoking cessation.;Monitor prescription use compliance.    Expected Outcomes Short Term: Continued assessment and intervention until BP is < 140/926mHG in hypertensive participants. < 130/8038mG in hypertensive participants with diabetes, heart failure or chronic kidney disease.;Long Term: Maintenance of blood pressure at goal levels.    Lipids Yes    Intervention Provide education and support for participant on nutrition & aerobic/resistive exercise along with prescribed medications to achieve LDL <27m8mDL >40mg52m Expected Outcomes Short Term: Participant states understanding of desired cholesterol values and is compliant with medications prescribed. Participant is following exercise prescription and nutrition guidelines.;Long Term: Cholesterol controlled with medications as prescribed, with individualized exercise RX and with personalized nutrition plan. Value goals: LDL < 27mg,59m > 40 mg.            Education:Diabetes - Individual verbal and written instruction to review signs/symptoms of diabetes, desired ranges of glucose level fasting, after meals and with exercise. Acknowledge that pre and post exercise glucose checks will be done for 3 sessions at entry of program.   Education: Know Your Numbers and Risk Factors: -Group verbal and written instruction about important numbers in your health.  Discussion of what are risk factors and how they play a role in the disease process.  Review of Cholesterol, Blood Pressure, Diabetes, and BMI and the role they play in your overall health.   Core Components/Risk Factors/Patient Goals Review:    Core Components/Risk Factors/Patient Goals at Discharge (Final Review):    ITP Comments:  ITP Comments    Row Name 04/22/20 1344 04/23/20 1437 04/29/20 1120 05/01/20 0526 05/13/20 0953   ITP Comments Virtual Visit completed. Patient informed on EP and RD appointment and 6  Minute walk test. Patient also informed of patient health questionnaires on My Chart. Patient Verbalizes understanding. Visit diagnosis can be found in [facility] 03/07/2020. Completed 6MWT and gym orientation. Initial ITP created and sent for review to Dr. Emily Filbert, Medical Director. First full day of exercise!  Patient was oriented to gym and equipment including functions, settings, policies, and procedures.  Patient's individual exercise prescription and treatment plan were reviewed.  All starting workloads were established based on the results of the 6 minute walk test done at initial orientation visit.  The plan for exercise progression was also introduced and progression will be customized based on patient's performance and goals. 30 Day review completed. Medical Director ITP review done, changes made as directed, and signed approval by Medical Director. Thaddaeus called to let us know that he would like to discharge from the program.  He felt embarrassed and that he could not do the  program.  We will discharge him at this time.          Comments: Discharge ITP

## 2020-05-13 NOTE — Progress Notes (Signed)
Discharge Progress Report  Patient Details  Name: Brian Callahan MRN: [id number] Date of Birth: [birth year] Referring Provider:     Pulmonary Rehab from 04/23/2020 in [facility]  Referring Provider Ottie Glazier MD       Number of Visits: 4  Reason for Discharge:  Early Exit:  Personal  Smoking History:  Social History   Tobacco Use  Smoking Status Former Smoker  . Packs/day: 0.25  . Years: 10.00  . Pack years: 2.50  . Types: Cigarettes  . Quit date: 10/12/1990  . Years since quitting: 29.6  Smokeless Tobacco Never Used  Tobacco Comment   Quit 20 years ago    Diagnosis:  Pulmonary emphysema, unspecified emphysema type ([place])  ADL UCSD:  Pulmonary Assessment Scores    Row Name 04/23/20 1454         ADL UCSD   ADL Phase Entry     SOB Score total 7     Rest 0     Walk 0     Stairs 3     Bath 0     Dress 0     Shop 0       CAT Score   CAT Score 10       mMRC Score   mMRC Score 1            Initial Exercise Prescription:  Initial Exercise Prescription - 04/23/20 1500      Date of Initial Exercise RX and Referring Provider   Date 04/23/20    Referring Provider Ottie Glazier MD      Treadmill   MPH 1.6    Grade 0.5    Minutes 15    METs 2.32      REL-XR   Level 2    Minutes 15    METs 2.2      T5 Nustep   Level 1    SPM 80    Minutes 15    METs 2.2      Prescription Details   Frequency (times per week) 2      Intensity   THRR 40-80% of Max Heartrate 100-129    Ratings of Perceived Exertion 11-13    Perceived Dyspnea 0-4      Progression   Progression Continue to progress workloads to maintain intensity without signs/symptoms of physical distress.      Resistance Training   Training Prescription Yes    Weight 3 lb    Reps 10-15           Discharge Exercise Prescription (Final Exercise Prescription Changes):  Exercise Prescription Changes - 05/02/20 0800      Response to Exercise   Blood Pressure  (Admit) 122/64    Blood Pressure (Exercise) 160/66    Blood Pressure (Exit) 126/64    Heart Rate (Admit) 71 bpm    Heart Rate (Exercise) 115 bpm    Heart Rate (Exit) 76 bpm    Oxygen Saturation (Admit) 95 %    Oxygen Saturation (Exercise) 90 %    Oxygen Saturation (Exit) 95 %    Rating of Perceived Exertion (Exercise) 11    Perceived Dyspnea (Exercise) 0    Symptoms none    Duration Progress to 30 minutes of  aerobic without signs/symptoms of physical distress    Intensity THRR unchanged      Progression   Progression Continue to progress workloads to maintain intensity without signs/symptoms of physical distress.    Average METs 2.3  Resistance Training   Training Prescription Yes    Weight 3 lb    Reps 10-15      Interval Training   Interval Training No      Treadmill   MPH 1.6    Grade 0.5    Minutes 15    METs 2.32      T5 Nustep   Level 1    Minutes 15           Functional Capacity:  6 Minute Walk    Row Name 04/23/20 1437         6 Minute Walk   Phase Initial     Distance 990 feet     Walk Time 6 minutes     # of Rest Breaks 0     MPH 1.8     METS 2.28     RPE 11     Perceived Dyspnea  0     VO2 Peak 8     Symptoms No     Resting HR 71 bpm     Resting BP 128/72     Resting Oxygen Saturation  94 %     Exercise Oxygen Saturation  during 6 min walk 90 %     Max Ex. HR 106 bpm     Max Ex. BP 162/68     2 Minute Post BP 130/70       Interval HR   1 Minute HR 94     2 Minute HR 85     3 Minute HR 100     4 Minute HR 106     5 Minute HR 92     6 Minute HR 104     2 Minute Post HR 73     Interval Heart Rate? Yes       Interval Oxygen   Interval Oxygen? Yes     Baseline Oxygen Saturation % 94 %     1 Minute Oxygen Saturation % 92 %     1 Minute Liters of Oxygen 0 L  RA     2 Minute Oxygen Saturation % 90 %     2 Minute Liters of Oxygen 0 L     3 Minute Oxygen Saturation % 90 %     3 Minute Liters of Oxygen 0 L     4 Minute Oxygen  Saturation % 91 %     4 Minute Liters of Oxygen 0 L     5 Minute Oxygen Saturation % 90 %     5 Minute Liters of Oxygen 0 L     6 Minute Oxygen Saturation % 91 %     6 Minute Liters of Oxygen 0 L     2 Minute Post Oxygen Saturation % 95 %     2 Minute Post Liters of Oxygen 0 L            Psychological, QOL, Others - Outcomes: PHQ 2/9: Depression screen PHQ 2/9 04/23/2020  Decreased Interest 0  Down, Depressed, Hopeless 0  PHQ - 2 Score 0  Altered sleeping 2  Tired, decreased energy 1  Change in appetite 0  Feeling bad or failure about yourself  0  Trouble concentrating 0  Moving slowly or fidgety/restless 0  Suicidal thoughts 0  PHQ-9 Score 3  Difficult doing work/chores Not difficult at all    Quality of Life:   Personal Goals: Goals established at orientation with interventions provided to work toward goal.  Charity fundraiser  Goals and Risk Factors at Admission - 04/23/20 1451      Core Components/Risk Factors/Patient Goals on Admission    Weight Management Yes;Weight Loss    Intervention Weight Management: Develop a combined nutrition and exercise program designed to reach desired caloric intake, while maintaining appropriate intake of nutrient and fiber, sodium and fats, and appropriate energy expenditure required for the weight goal.;Weight Management: Provide education and appropriate resources to help participant work on and attain dietary goals.;Weight Management/Obesity: Establish reasonable short term and long term weight goals.    Admit Weight 184 lb 4.8 oz (83.6 kg)    Goal Weight: Short Term 179 lb (81.2 kg)    Goal Weight: Long Term 174 lb (78.9 kg)    Expected Outcomes Short Term: Continue to assess and modify interventions until short term weight is achieved;Long Term: Adherence to nutrition and physical activity/exercise program aimed toward attainment of established weight goal;Weight Loss: Understanding of general recommendations for a balanced deficit meal plan,  which promotes 1-2 lb weight loss per week and includes a negative energy balance of [phone] kcal/d;Understanding recommendations for meals to include 15-35% energy as protein, 25-35% energy from fat, 35-60% energy from carbohydrates, less than 200mg  of dietary cholesterol, 20-35 gm of total fiber daily;Understanding of distribution of calorie intake throughout the day with the consumption of 4-5 meals/snacks    Improve shortness of breath with ADL's Yes    Intervention Provide education, individualized exercise plan and daily activity instruction to help decrease symptoms of SOB with activities of daily living.    Expected Outcomes Short Term: Improve cardiorespiratory fitness to achieve a reduction of symptoms when performing ADLs;Long Term: Be able to perform more ADLs without symptoms or delay the onset of symptoms    Diabetes Yes   Diet controlled   Intervention Provide education about signs/symptoms and action to take for hypo/hyperglycemia.;Provide education about proper nutrition, including hydration, and aerobic/resistive exercise prescription along with prescribed medications to achieve blood glucose in normal ranges: Fasting glucose 65-99 mg/dL    Expected Outcomes Short Term: Participant verbalizes understanding of the signs/symptoms and immediate care of hyper/hypoglycemia, proper foot care and importance of medication, aerobic/resistive exercise and nutrition plan for blood glucose control.;Long Term: Attainment of HbA1C < 7%.    Hypertension Yes    Intervention Provide education on lifestyle modifcations including regular physical activity/exercise, weight management, moderate sodium restriction and increased consumption of fresh fruit, vegetables, and low fat dairy, alcohol moderation, and smoking cessation.;Monitor prescription use compliance.    Expected Outcomes Short Term: Continued assessment and intervention until BP is < 140/33mm HG in hypertensive participants. < 130/32mm HG in  hypertensive participants with diabetes, heart failure or chronic kidney disease.;Long Term: Maintenance of blood pressure at goal levels.    Lipids Yes    Intervention Provide education and support for participant on nutrition & aerobic/resistive exercise along with prescribed medications to achieve LDL 70mg , HDL >40mg .    Expected Outcomes Short Term: Participant states understanding of desired cholesterol values and is compliant with medications prescribed. Participant is following exercise prescription and nutrition guidelines.;Long Term: Cholesterol controlled with medications as prescribed, with individualized exercise RX and with personalized nutrition plan. Value goals: LDL < 70mg , HDL > 40 mg.            Personal Goals Discharge:   Exercise Goals and Review:  Exercise Goals    Row Name 04/23/20 1449             Exercise Goals   Increase  Physical Activity Yes       Intervention Provide advice, education, support and counseling about physical activity/exercise needs.;Develop an individualized exercise prescription for aerobic and resistive training based on initial evaluation findings, risk stratification, comorbidities and participant's personal goals.       Expected Outcomes Short Term: Attend rehab on a regular basis to increase amount of physical activity.;Long Term: Add in home exercise to make exercise part of routine and to increase amount of physical activity.;Long Term: Exercising regularly at least 3-5 days a week.       Increase Strength and Stamina Yes       Intervention Provide advice, education, support and counseling about physical activity/exercise needs.;Develop an individualized exercise prescription for aerobic and resistive training based on initial evaluation findings, risk stratification, comorbidities and participant's personal goals.       Expected Outcomes Short Term: Increase workloads from initial exercise prescription for resistance, speed, and  METs.;Short Term: Perform resistance training exercises routinely during rehab and add in resistance training at home;Long Term: Improve cardiorespiratory fitness, muscular endurance and strength as measured by increased METs and functional capacity (6MWT)       Able to understand and use rate of perceived exertion (RPE) scale Yes       Intervention Provide education and explanation on how to use RPE scale       Expected Outcomes Short Term: Able to use RPE daily in rehab to express subjective intensity level;Long Term:  Able to use RPE to guide intensity level when exercising independently       Able to understand and use Dyspnea scale Yes       Intervention Provide education and explanation on how to use Dyspnea scale       Expected Outcomes Short Term: Able to use Dyspnea scale daily in rehab to express subjective sense of shortness of breath during exertion;Long Term: Able to use Dyspnea scale to guide intensity level when exercising independently       Knowledge and understanding of Target Heart Rate Range (THRR) Yes       Intervention Provide education and explanation of THRR including how the numbers were predicted and where they are located for reference       Expected Outcomes Short Term: Able to state/look up THRR;Short Term: Able to use daily as guideline for intensity in rehab;Long Term: Able to use THRR to govern intensity when exercising independently       Able to check pulse independently Yes       Intervention Provide education and demonstration on how to check pulse in carotid and radial arteries.;Review the importance of being able to check your own pulse for safety during independent exercise       Expected Outcomes Short Term: Able to explain why pulse checking is important during independent exercise;Long Term: Able to check pulse independently and accurately       Understanding of Exercise Prescription Yes       Intervention Provide education, explanation, and written materials  on patient's individual exercise prescription       Expected Outcomes Short Term: Able to explain program exercise prescription;Long Term: Able to explain home exercise prescription to exercise independently              Exercise Goals Re-Evaluation:  Exercise Goals Re-Evaluation    Row Name 04/29/20 1120 05/02/20 0809           Exercise Goal Re-Evaluation   Exercise Goals Review Increase Physical Activity;Able to understand and  use rate of perceived exertion (RPE) scale;Knowledge and understanding of Target Heart Rate Range (THRR);Understanding of Exercise Prescription;Increase Strength and Stamina;Able to understand and use Dyspnea scale;Able to check pulse independently Increase Physical Activity;Increase Strength and Stamina;Understanding of Exercise Prescription      Comments Reviewed RPE and dyspnea scales, THR and program prescription with pt today.  Pt voiced understanding and was given a copy of goals to take home. Brian Callahan has completed his first two full days of exercise.  We will continue to monitor his progress.      Expected Outcomes Short: Use RPE daily to regulate intensity. Long: Follow program prescription in THR. short: Continue to attend regularly Long; Conitnue to follow program prescription             Nutrition & Weight - Outcomes:  Pre Biometrics - 04/23/20 1450      Pre Biometrics   Height 5' 7.5" (1.715 m)    Weight 184 lb 4.8 oz (83.6 kg)    BMI (Calculated) 28.42    Single Leg Stand 4.7 seconds            Nutrition:  Nutrition Therapy & Goals - 05/08/20 1435      Nutrition Therapy   Diet Heart healthy, low sodium    Protein (specify units) 65g    Fiber 30 grams    Whole Grain Foods 3 servings    Saturated Fats 12 max. grams    Fruits and Vegetables 8 servings/day    Sodium 1.5 grams      Personal Nutrition Goals   Nutrition Goal ST: 1  serving fruit at lunch, 1 serving of vegetables at dinner LT: A1C 6.9 - keep below 7, lose 10 pounds, not  start insulin    Comments Brian Callahan reports that he has reduced his candy intake and has replaced it with fruit - milkyway was his favorite. wife is vegan, he will cook seperatley. No breakfast. Lunch: coffee, piece of gingerbread. He reports using butter on gingerbread or peanut butter sandwich. Yogurt or cottage cheese. Dinner: Buys egglands best - 25% less cholesterol. Thomas whole wheat english muffin. Will sometimes have saugage on it. Chili with beef. Rotini with onions, peppers, cheese sauce, and beef. Hamburger patties. Only eats whole wheat buns and pasta. Goulash - with beef. Makes a pot of navy beans every other week. He likes fish but doesnt get it much anymore because he doesn't go out. he likes to try [place] in his chili. Uses extra virgin olive oil when cooking. No added salt when cooking. On the side (1x/week) he will have vegetables like broccoli or cauliflower or carrots. - will sometimes have corn. Buys no salt added beans. Discussed adding in more fruit and vegetables and reducing red meat. discussed some vegetables other than broccoli - directed him to videos how to cut every vegetable and how to slice every fruit.      Intervention Plan   Intervention Prescribe, educate and counsel regarding individualized specific dietary modifications aiming towards targeted core components such as weight, hypertension, lipid management, diabetes, heart failure and other comorbidities.;Nutrition handout(s) given to patient.    Expected Outcomes Short Term Goal: Understand basic principles of dietary content, such as calories, fat, sodium, cholesterol and nutrients.;Short Term Goal: A plan has been developed with personal nutrition goals set during dietitian appointment.;Long Term Goal: Adherence to prescribed nutrition plan.           Nutrition Discharge:  Nutrition Assessments - 04/23/20 1511  MEDFICTS Scores   Pre Score 69           Education Questionnaire Score:  Knowledge  Questionnaire Score - 04/29/20 1226      Knowledge Questionnaire Score   Pre Score 13/18: Oxygen, nutrition           Goals reviewed with patient; copy given to patient.

## 2020-05-20 ENCOUNTER — Ambulatory Visit: Payer: Medicare Other | Attending: Internal Medicine

## 2020-05-20 DIAGNOSIS — Z23 Encounter for immunization: Secondary | ICD-10-CM

## 2020-05-20 NOTE — Progress Notes (Signed)
   Covid-19 Vaccination Clinic  Name:  DEVION CHRISCOE    MRN: [id number] DOB: Apr 11, 1941  05/20/2020  Mr. Hamada was observed post Covid-19 immunization for 15 minutes without incident. He was provided with Vaccine Information Sheet and instruction to access the V-Safe system.   Mr. Mark was instructed to call 911 with any severe reactions post vaccine: Marland Kitchen Difficulty breathing  . Swelling of face and throat  . A fast heartbeat  . A bad rash all over body  . Dizziness and weakness

## 2020-05-29 ENCOUNTER — Encounter: Payer: Self-pay | Admitting: *Deleted

## 2020-06-13 DIAGNOSIS — L409 Psoriasis, unspecified: Principal | ICD-10-CM

## 2020-06-13 MED ORDER — CLOBETASOL 0.05 % TOPICAL OINTMENT
3 refills | 0 days | Status: CP
Start: 2020-06-13 — End: ?

## 2020-08-23 ENCOUNTER — Ambulatory Visit: Payer: Self-pay | Admitting: Urology

## 2020-09-05 ENCOUNTER — Ambulatory Visit: Payer: Self-pay | Admitting: Urology

## 2020-10-03 ENCOUNTER — Ambulatory Visit: Payer: Medicare Other | Admitting: Urology

## 2020-10-03 ENCOUNTER — Encounter: Payer: Self-pay | Admitting: Urology

## 2020-10-03 ENCOUNTER — Other Ambulatory Visit: Payer: Self-pay

## 2020-10-03 VITALS — BP 156/72 | HR 73 | Ht 67.5 in | Wt 178.0 lb

## 2020-10-03 DIAGNOSIS — N401 Enlarged prostate with lower urinary tract symptoms: Secondary | ICD-10-CM

## 2020-10-03 DIAGNOSIS — R339 Retention of urine, unspecified: Secondary | ICD-10-CM | POA: Diagnosis not present

## 2020-10-03 LAB — BLADDER SCAN AMB NON-IMAGING

## 2020-10-03 NOTE — Progress Notes (Signed)
10/03/2020 2:06 PM   Brian Callahan 11/10/1940 [id number]  Referring provider: Idelle Crouch, MD [facility] [place],  [place] [address]  Chief Complaint  Patient presents with  . Benign Prostatic Hypertrophy    HPI: 80 y.o. male presents for follow-up   Status post UroLift 01/23/2020  Preop PVR 259 mL  1 month postop visit with IPSS 7/35 and PVR 44 mL  States for the past 1-2 months he has urge to go and difficulty emptying  He did stop tamsulosin/finasteride  No dysuria or gross hematuria  No flank, abdominal or pelvic pain  Bladder scan PVR today was 268 mL  IPSS 15/35     PMH: Past Medical History:  Diagnosis Date  . Anemia    pt denies 01/18/20  . BP (high blood pressure) 05/23/2015  . BPH with obstruction/lower urinary tract symptoms   . COPD (chronic obstructive pulmonary disease) ([place])   . Diabetes mellitus without complication (H[facility])    diet controlled  . Dyspnea   . Encounter for long-term (current) use of other high-risk medications   . GERD (gastroesophageal reflux disease)    gastritis  . GI bleed   . Gout   . H/O hernia repair mid-90s   x2  . History of elevated PSA   . History of [facility] spotted fever   . HLD (hyperlipidemia)   . HTN (hypertension)   . Hyperglycemia   . Hyperuricemia   . Incomplete bladder emptying   . Macular degeneration   . Obesity   . Psoriasis   . Psoriasis   . Urinary frequency     Surgical History: Past Surgical History:  Procedure Laterality Date  . APPENDECTOMY    . BACK SURGERY     neck and spine 2007  . CERVICAL SPINE SURGERY  2007  . CHOLECYSTECTOMY N/A 10/18/2017   Procedure: LAPAROSCOPIC CHOLECYSTECTOMY;  Surgeon: [name], MD;  Location: [facility] ORS;  Service: General;  Laterality: N/A;  . COLECTOMY  1998   partial, due to obstruction  . COLONOSCOPY     lost 10 units of blood  . COLONOSCOPY WITH PROPOFOL N/A 09/27/2019   Procedure: COLONOSCOPY  WITH PROPOFOL;  Surgeon: [name], [name], MD;  Location: [facility] ENDOSCOPY;  Service: Gastroenterology;  Laterality: N/A;  . CYSTOSCOPY WITH INSERTION OF UROLIFT N/A 01/23/2020   Procedure: CYSTOSCOPY WITH INSERTION OF UROLIFT;  Surgeon: [name], MD;  Location: [facility] ORS;  Service: Urology;  Laterality: N/A;  . ESOPHAGOGASTRODUODENOSCOPY N/A 10/14/2016   Procedure: ESOPHAGOGASTRODUODENOSCOPY (EGD);  Surgeon: [name], MD;  Location: [facility] ENDOSCOPY;  Service: Endoscopy;  Laterality: N/A;  . ESOPHAGOGASTRODUODENOSCOPY (EGD) WITH PROPOFOL N/A 09/27/2019   Procedure: ESOPHAGOGASTRODUODENOSCOPY (EGD) WITH PROPOFOL;  Surgeon: [name], [name], MD;  Location: [facility] ENDOSCOPY;  Service: Gastroenterology;  Laterality: N/A;  . EYE SURGERY     bilateral cataracts  . HERNIA REPAIR  mid-90s   x2  inguinal  . PROSTATE BIOPSY  2013  . SURGERY SCROTAL / TESTICULAR     for fibrous growth    Home Medications:  Allergies as of 10/03/2020      Reactions   Isosorbide Nitrate Other (See Comments)   unkn   Shellfish Allergy Other (See Comments)   gout   Trazodone And Nefazodone Diarrhea      Medication List       Accurate as of October 03, 2020  2:06 PM. If you have any questions, ask your nurse or doctor.  acetaminophen 500 MG tablet Commonly known as: TYLENOL Take 500-1,000 mg by mouth every 6 (six) hours as needed (for headache/minor pain.).   Advair Diskus 250-50 MCG/DOSE Aepb Generic drug: Fluticasone-Salmeterol Inhale 1 puff 2 (two) times daily into the lungs.   betamethasone dipropionate 0.05 % cream Apply 1 application topically 2 (two) times daily as needed (for psoriasis).   clobetasol ointment 0.05 % Commonly known as: TEMOVATE Apply 1 application topically daily as needed (psoriasis).   enalapril 5 MG tablet Commonly known as: VASOTEC Take 1 tablet (5 mg total) by mouth 2 (two) times daily.   ezetimibe 10 MG tablet Commonly known as: ZETIA Take 10 mg by mouth  daily.   finasteride 5 MG tablet Commonly known as: PROSCAR TAKE 1 TABLET BY MOUTH  DAILY   Melatonin 10 MG Tabs Take 10 mg by mouth at bedtime as needed (sleeo).   metoprolol succinate 50 MG 24 hr tablet Commonly known as: TOPROL-XL Take 50 mg by mouth daily. Take with or immediately following a meal.   omeprazole 20 MG capsule Commonly known as: PRILOSEC Take 20 mg by mouth daily.   PRESERVISION AREDS 2 PO Take 2 capsules by mouth daily.   tamsulosin 0.4 MG Caps capsule Commonly known as: FLOMAX TAKE 1 CAPSULE BY MOUTH  TWICE DAILY   TART CHERRY ADVANCED PO Take 12 mg by mouth daily.   Uribel 118 MG Caps Take 1 capsule (118 mg total) by mouth 3 (three) times daily as needed (Urinary frequency, urgency, burning).       Allergies:  Allergies  Allergen Reactions  . Isosorbide Nitrate Other (See Comments)    unkn  . Shellfish Allergy Other (See Comments)    gout  . Trazodone And Nefazodone Diarrhea    Family History: Family History  Problem Relation Age of Onset  . Kidney failure Brother   . Prostate cancer Neg Hx     Social History:  reports that he quit smoking about 29 years ago. His smoking use included cigarettes. He has a 2.50 pack-year smoking history. He has never used smokeless tobacco. He reports current alcohol use of about 18.0 standard drinks of alcohol per week. He reports that he does not use drugs.   Physical Exam: There were no vitals taken for this visit.  Constitutional:  Alert and oriented, No acute distress. HEENT: [place] AT, moist mucus membranes.  Trachea midline, no masses. Cardiovascular: No clubbing, cyanosis, or edema. Respiratory: Normal respiratory effort, no increased work of breathing.   Assessment & Plan:    1. Benign prostatic hyperplasia with lower urinary tract symptoms  Marked improvement in bladder emptying 1 month postop visit however recently with worsening LUTS and elevated PVR  Restart tamsulosin 0.4 mg  daily  Follow-up 1 month for repeat bladder scan  Schedule cystoscopy for persistent incomplete bladder emptying  2.  Incomplete bladder emptying  As above   [name], [facility] [address] [place], [place] [address] [phone]

## 2020-10-28 ENCOUNTER — Other Ambulatory Visit: Payer: Self-pay | Admitting: Urology

## 2020-10-28 DIAGNOSIS — N138 Other obstructive and reflux uropathy: Secondary | ICD-10-CM

## 2020-10-29 DIAGNOSIS — L409 Psoriasis, unspecified: Principal | ICD-10-CM

## 2020-10-29 MED ORDER — CLOBETASOL 0.05 % TOPICAL OINTMENT
3 refills | 0.00000 days
Start: 2020-10-29 — End: ?

## 2020-10-30 MED ORDER — CLOBETASOL 0.05 % TOPICAL OINTMENT
OPHTHALMIC | 3 refills | 0.00000 days | Status: CP
Start: 2020-10-30 — End: ?

## 2020-11-04 ENCOUNTER — Telehealth: Payer: Self-pay

## 2020-11-04 ENCOUNTER — Ambulatory Visit (INDEPENDENT_AMBULATORY_CARE_PROVIDER_SITE_OTHER): Payer: Medicare Other

## 2020-11-04 ENCOUNTER — Telehealth: Payer: Self-pay | Admitting: Cardiovascular Disease

## 2020-11-04 ENCOUNTER — Other Ambulatory Visit: Payer: Self-pay

## 2020-11-04 DIAGNOSIS — R339 Retention of urine, unspecified: Secondary | ICD-10-CM | POA: Diagnosis not present

## 2020-11-04 LAB — BLADDER SCAN AMB NON-IMAGING: Scan Result: 72

## 2020-11-04 NOTE — Telephone Encounter (Signed)
Was able to return pt's phone call, he reports this morning his BP machine read "red" d/t HR 58. BP 139/51. Pt reports his typical HR is in the 60s and wanted to call and make his f/u appt with Dr. Rockey Situ since his HR was lower than normal. Pt denied any symptoms r/t HR of 58.   Noted pt is taking metoprolol 50 mg daily and advised sometimes it can lower HR in the am after fist waking up and if not well hydrated. Pt reports he did restrict his fluid intake d/t increase urine output and had an appt with urologist earlier today, BP at appt today was 140/70 and HR 81.   Advised pt to stay hydrated and monitor HR until f/u appt with Dr. Rockey Situ on 4/19. Then medication can be adjusted if needed. If HR stays low and develop symptoms such as CP, sudden shob, one sided weakness, dizziness, or lightheadedness then seek medial attention/ Pt verbalized understanding, grateful for the quick return call, and will see at appt next Tuesday.

## 2020-11-04 NOTE — Telephone Encounter (Signed)
Pt came in clinic this afternoon. PVR 72 ML

## 2020-11-04 NOTE — Telephone Encounter (Signed)
Patient calling back to report HR 58 and bp 139/51

## 2020-11-04 NOTE — Telephone Encounter (Signed)
STAT if HR is under 50 or over 120 (normal HR is 60-100 beats per minute)  1) What is your heart rate? Unknown patient believes brady   2) Do you have a log of your heart rate readings (document readings)?  No will go home and check monitor and call back with numbers patient thinks it was in the 93 's   3) Do you have any other symptoms?  Lightheadedness every now and then    Scheduled with Dr. Rockey Situ 4-19 .  Declined sooner APP

## 2020-11-05 NOTE — Telephone Encounter (Signed)
Please schedule 31-month follow-up office visit with PVR

## 2020-11-07 MED ORDER — METFORMIN 500 MG TABLET
Freq: Two times a day (BID) | 0.00000 days
Start: 2020-11-07 — End: ?

## 2020-11-11 NOTE — Progress Notes (Signed)
Cardiology Office Note  Date:  11/12/2020   ID:  Brian, Callahan [birth year], MRN [id number]  PCP:  Idelle Crouch, MD   Chief Complaint  Patient presents with  . Follow-up    Decreased HR per pt---see tele note from 11/04/20    HPI:  [name] is a 80 year old gentleman with past medical history of former smoker, quit 25 years ago Abnormal stress tests, Chest pain Prior GI bleed requiring hospitalization, mesenteric artery embolization Gastritis, GERD shows normal sinus rhythm with rate 72 bpm no significant ST or T wave changes Shortness of breath on exertion Hyperlipidemia, on zetia Heavy drinker in the past Prior  Cardiac cath 2010 no significant coronary disease Who presents for f/u of his chest pain  In follow-up today he brings numerous blood pressure and heart rate recordings to review Reports seeing some red and yellow lights on his blood pressure cuff, etiology unclear Heart rate typically running 60 to 70 bpm Blood pressure ranging from 248 up to 250 systolic on average 037C Rare numbers less than 110 Reports that he does not drink much fluids, does not drink anything for breakfast Has little bit of coffee at lunchtime  Recently seen by Dr. Doy Hutching, metoprolol succinate increased up to 75 daily Denies any orthostasis symptoms No chest pain  EKG personally reviewed by myself on todays visit Shows normal sinus rhythm rate 61 bpm no significant ST or T wave changes  Lab work reviewed Total chol 170, LDL 90 HBA1C 6.7  Carotid u/s 06/2017 Minimal to moderate amount of bilateral atherosclerotic plaque, right greater than left,   CT angio chest 2015 Images reviewed Mild scattered atherosclerosis in the abdominal aorta  Prior heart cath 2010 or 2011 "95% open in all your arteries"  Carotid u/s Minimal to moderate amount of bilateral atherosclerotic plaque, right greater than left, not resulting in a hemodynamically significant stenosis within  either internal carotid artery.  Previously seen by cardiology 09/28/2017 at [facility]  2D echocardiogram 07/15/2017 revealed LVEF of 50% with moderate aortic insufficiency.   [place] 07/15/2017 revealed LV ejection fraction of 63% with mild inferior wall ischemia. "Mild perfusion abnormality of mild intensity"  10/18/17 had gall bladder out, Treated by Dr. Tiffany Kocher   PMH:   has a past medical history of Anemia, BP (high blood pressure) (05/23/2015), BPH with obstruction/lower urinary tract symptoms, COPD (chronic obstructive pulmonary disease) ([place]), Diabetes mellitus without complication ([place]), Dyspnea, Encounter for long-term (current) use of other high-risk medications, GERD (gastroesophageal reflux disease), GI bleed, Gout, H/O hernia repair (mid-90s), History of elevated PSA, History of Rocky Mountain spotted fever, HLD (hyperlipidemia), HTN (hypertension), Hyperglycemia, Hyperuricemia, Incomplete bladder emptying, Macular degeneration, Obesity, Psoriasis, Psoriasis, and Urinary frequency.  PSH:    Past Surgical History:  Procedure Laterality Date  . APPENDECTOMY    . BACK SURGERY     neck and spine 2007  . CERVICAL SPINE SURGERY  2007  . CHOLECYSTECTOMY N/A 10/18/2017   Procedure: LAPAROSCOPIC CHOLECYSTECTOMY;  Surgeon: [name], MD;  Location: [facility] ORS;  Service: General;  Laterality: N/A;  . COLECTOMY  1998   partial, due to obstruction  . COLONOSCOPY     lost 10 units of blood  . COLONOSCOPY WITH PROPOFOL N/A 09/27/2019   Procedure: COLONOSCOPY WITH PROPOFOL;  Surgeon: [name], [name], MD;  Location: [facility] ENDOSCOPY;  Service: Gastroenterology;  Laterality: N/A;  . CYSTOSCOPY WITH INSERTION OF UROLIFT N/A 01/23/2020   Procedure: CYSTOSCOPY WITH INSERTION OF UROLIFT;  Surgeon: [name],  MD;  Location: [facility] ORS;  Service: Urology;  Laterality: N/A;  . ESOPHAGOGASTRODUODENOSCOPY N/A 10/14/2016   Procedure: ESOPHAGOGASTRODUODENOSCOPY (EGD);  Surgeon: [name], MD;  Location: [facility]COPY;  Service: Endoscopy;  Laterality: N/A;  . ESOPHAGOGASTRODUODENOSCOPY (EGD) WITH PROPOFOL N/A 09/27/2019   Procedure: ESOPHAGOGASTRODUODENOSCOPY (EGD) WITH PROPOFOL;  Surgeon: [name], [name], MD;  Location: [facility] ENDOSCOPY;  Service: Gastroenterology;  Laterality: N/A;  . EYE SURGERY     bilateral cataracts  . HERNIA REPAIR  mid-90s   x2  inguinal  . PROSTATE BIOPSY  2013  . SURGERY SCROTAL / TESTICULAR     for fibrous growth    Current Outpatient Medications  Medication Sig Dispense Refill  . acetaminophen (TYLENOL) 500 MG tablet Take 500-1,000 mg by mouth every 6 (six) hours as needed (for headache/minor pain.).    Marland Kitchen clobetasol ointment (TEMOVATE) 7.61 % Apply 1 application topically daily as needed (psoriasis).     . enalapril (VASOTEC) 5 MG tablet Take 1 tablet (5 mg total) by mouth 2 (two) times daily.    Marland Kitchen ezetimibe (ZETIA) 10 MG tablet Take 10 mg by mouth daily.    . finasteride (PROSCAR) 5 MG tablet Take 5 mg by mouth daily.    . Melatonin 10 MG TABS Take 10 mg by mouth at bedtime as needed (sleeo).    . metFORMIN (GLUCOPHAGE) 500 MG tablet Take 1 tablet by mouth 2 (two) times daily.    . metoprolol succinate (TOPROL-XL) 50 MG 24 hr tablet Take 1.5 tablets by mouth daily.    . Misc Natural Products (TART CHERRY ADVANCED PO) Take 12 mg by mouth daily.     . tamsulosin (FLOMAX) 0.4 MG CAPS capsule TAKE 1 CAPSULE BY MOUTH  TWICE DAILY 180 capsule 3   No current facility-administered medications for this visit.     Allergies:   Isosorbide nitrate, Shellfish allergy, and Trazodone and nefazodone   Social History:  The patient  reports that he quit smoking about 30 years ago. His smoking use included cigarettes. He has a 2.50 pack-year smoking history. He has never used smokeless tobacco. He reports current alcohol use of about 18.0 standard drinks of alcohol per week. He reports that he does not use drugs.   Family History:   family history  includes Kidney failure in his brother.    Review of Systems: Review of Systems  Constitutional: Negative.   HENT: Negative.   Respiratory: Negative.   Cardiovascular: Negative.   Gastrointestinal: Negative.   Musculoskeletal: Negative.   Neurological: Negative.   Psychiatric/Behavioral: Negative.   All other systems reviewed and are negative.   PHYSICAL EXAM: VS:  BP 110/64   Pulse 61   Ht 5' 7.5" (1.715 m)   Wt 184 lb (83.5 kg)   BMI 28.39 kg/m  , BMI Body mass index is 28.39 kg/m. Constitutional:  oriented to person, place, and time. No distress.  HENT:  Head: Grossly normal Eyes:  no discharge. No scleral icterus.  Neck: No JVD, no carotid bruits  Cardiovascular: Regular rate and rhythm, no murmurs appreciated Pulmonary/Chest: Clear to auscultation bilaterally, no wheezes or rails Abdominal: Soft.  no distension.  no tenderness.  Musculoskeletal: Normal range of motion Neurological:  normal muscle tone. Coordination normal. No atrophy Skin: Skin warm and dry Psychiatric: normal affect, pleasant  Recent Labs: No results found for requested labs within last 8760 hours.    Lipid Panel No results found for: CHOL, HDL, LDLCALC, TRIG    Wt Readings from  Last 3 Encounters:  11/12/20 184 lb (83.5 kg)  10/03/20 178 lb (80.7 kg)  04/23/20 184 lb 4.8 oz (83.6 kg)     ASSESSMENT AND PLAN:  Chest pain Atypical in nature,  No significant symptoms on today's visit, no further work-up needed  Gastrointestinal hemorrhage, unspecified gastrointestinal hemorrhage type  2 GI bleeds in the past Reports no recent bleeding  Essential hypertension Blood pressure appears stable, numerous numbers including pulse rates reviewed No medication changes made Recommend he try to increase his fluid intake in the morning especially for low blood pressures  Mixed hyperlipidemia  aortic atherosclerosis,  On Zetia Consider adding Crestor 5 to achieve goal LDL less than  70  Anemia, unspecified type  Managed by primary care    Total encounter time more than 25 minutes  Greater than 50% was spent in counseling and coordination of care with the patient   No orders of the defined types were placed in this encounter.    Signed, Esmond Plants, M.D., Ph.D. 11/12/2020  [place], [place]

## 2020-11-12 ENCOUNTER — Other Ambulatory Visit: Payer: Self-pay

## 2020-11-12 ENCOUNTER — Encounter: Payer: Self-pay | Admitting: Cardiovascular Disease

## 2020-11-12 ENCOUNTER — Ambulatory Visit: Payer: Medicare Other | Admitting: Cardiovascular Disease

## 2020-11-12 VITALS — BP 110/64 | HR 61 | Ht 67.5 in | Wt 184.0 lb

## 2020-11-12 DIAGNOSIS — I10 Essential (primary) hypertension: Secondary | ICD-10-CM | POA: Diagnosis not present

## 2020-11-12 DIAGNOSIS — I739 Peripheral vascular disease, unspecified: Secondary | ICD-10-CM | POA: Diagnosis not present

## 2020-11-12 DIAGNOSIS — E782 Mixed hyperlipidemia: Secondary | ICD-10-CM | POA: Diagnosis not present

## 2020-11-12 DIAGNOSIS — R079 Chest pain, unspecified: Secondary | ICD-10-CM | POA: Diagnosis not present

## 2020-11-12 DIAGNOSIS — D649 Anemia, unspecified: Secondary | ICD-10-CM

## 2020-11-12 NOTE — Patient Instructions (Addendum)
Medication Instructions:  No changes  If you need a refill on your cardiac medications before your next appointment, please call your pharmacy.    Lab work: No new labs needed   If you have labs (blood work) drawn today and your tests are completely normal, you will receive your results only by: . MyChart Message (if you have MyChart) OR . A paper copy in the mail If you have any lab test that is abnormal or we need to change your treatment, we will call you to review the results.   Testing/Procedures: No new testing needed   Follow-Up: At [facility], you and your health needs are our priority.  As part of our continuing mission to provide you with exceptional heart care, we have created designated Provider Care Teams.  These Care Teams include your primary Cardiologist (physician) and Advanced Practice Providers (APPs -  Physician Assistants and Nurse Practitioners) who all work together to provide you with the care you need, when you need it.  . You will need a follow up appointment as needed  . Providers on your designated Care Team:   . Christopher Berge, NP . Ryan Dunn, PA-C . Jacquelyn Visser, PA-C  Any Other Special Instructions Will Be Listed Below (If Applicable).  COVID-19 Vaccine Information can be found at: [email or web address] For questions related to vaccine distribution or appointments, please email [email or web address] or call [phone].     

## 2020-11-27 MED ORDER — METOPROLOL SUCCINATE ER 50 MG TABLET,EXTENDED RELEASE 24 HR
0.00000 days
Start: 2020-11-27 — End: ?

## 2021-01-02 DIAGNOSIS — L409 Psoriasis, unspecified: Principal | ICD-10-CM

## 2021-01-02 MED ORDER — CLOBETASOL 0.05 % TOPICAL OINTMENT
0 refills | 0.00000 days
Start: 2021-01-02 — End: ?

## 2021-01-03 MED ORDER — CLOBETASOL 0.05 % TOPICAL OINTMENT
OPHTHALMIC | 0 refills | 0.00000 days | Status: CP
Start: 2021-01-03 — End: ?

## 2021-01-03 NOTE — Unmapped (Signed)
Refill for Clobetasol ointment

## 2021-01-08 DIAGNOSIS — L409 Psoriasis, unspecified: Principal | ICD-10-CM

## 2021-01-08 MED ORDER — CLOBETASOL 0.05 % TOPICAL OINTMENT
OPHTHALMIC | 0 refills | 0.00000 days | Status: CP
Start: 2021-01-08 — End: ?

## 2021-01-08 NOTE — Unmapped (Signed)
-----   Message from [name], MD sent at 01/08/2021  4:44 PM EDT -----  Regarding: RE: Please call and offer follow up  No, first available, thanks!  ----- Message -----  From: Christell Constant  Sent: 01/08/2021   4:33 PM EDT  To: [name], MD, #  Subject: RE: Please call and offer follow up              Does pt need to be seen sooner than 6/28 with you.   ----- Message -----  From: [name], MD  Sent: 01/08/2021   1:33 PM EDT  To: , #  Subject: Please call and offer follow up                  Hi, could someone please call this patient and offer him a follow up. Thank you!

## 2021-01-08 NOTE — Unmapped (Signed)
LVM, unable to reach pt

## 2021-01-20 MED ORDER — CLOBETASOL 0.05 % TOPICAL OINTMENT
0 refills | 0.00000 days
Start: 2021-01-20 — End: ?

## 2021-01-20 NOTE — Unmapped (Signed)
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 Not a candidate for MTX given transaminitis and alcohol use. No joint pains  - discussed topical treatment options  - continue clobetasol ointment twice daily as needed. He is currently using appropriately  - will recheck TB test in preparation to try getting Humira again. Messaged pharmacy staff to see if there are assistance programs available     High risk medication use  - check TB test today      Return to clinic: 1 year       CHIEF COMPLAINT:  Psoriasis follow up     HPI:   This is a pleasant 80 y.o. male who last saw me on 02/27/2020.  At that time he was seen for Humira.  He had previously been on Humira for psoriasis and psoriatic arthritis.  His rheumatologist is Dr. Sullivan Lone.    Today he reports doing well with topicals only. Still has active psoriasis with clobetasol. Uses for a few days in a row and then will take a break for 5 weeks or so. Denies joint pains. He would be willing to start Humira again if he can get coverage.      PAST MEDICAL HISTORY:  Peripheral artery disease  Hypertension  Hyperlipidemia  Hyperglycemia  Psoriasis  ??  Hx of psoriasis:  -H/o of psoriasis >??25 years, but flared in 2013 after a steroid injection for gout.   -Treated with light therapy 09/2015-01/2016 and topical CS  -Humira started 01/2016 for PsA and also cleared skin. Stopped due to loss of grant coverage in assistance program. Not a candidate for MTX due to EtOH use and transaminitis??     MEDICATIONS:   Current Outpatient Medications on File Prior to Visit   Medication Sig Dispense Refill   ??? acetaminophen (TYLENOL) 500 MG tablet Take 500-1,000 mg by mouth.     ??? ANTIOX #8/OM3/DHA/EPA/LUT/ZEAX (PRESERVISION AREDS 2, OMEGA-3, ORAL) Take by mouth daily at 0600.     ??? C/SOURCHERRY/CELERY/GRAPE SEED (TART CHERRY ORAL) Take by mouth.     ??? clobetasoL (TEMOVATE) 0.05 % ointment APPLY OINTMENT TO AFFECTED  AREA(S) TOPICALLY TWICE  DAILY AS NEEDED FOR  PSORIASIS 195 g 0   ??? enalapril (VASOTEC) 5 MG tablet Two (2) times a day.      ??? ezetimibe (ZETIA) 10 mg tablet Take 1 tablet by mouth once a day     ??? finasteride (PROSCAR) 5 mg tablet      ??? finasteride (PROSCAR) 5 mg tablet TAKE 1 TABLET (5 MG)  BY MOUTH  DAILY     ??? fluticasone propion-salmeteroL (ADVAIR, WIXELA) 250-50 mcg/dose diskus Use 1 inhalation every 12  hours     ??? melatonin 10 mg Tab Take 10 mg by mouth.     ??? metFORMIN (GLUCOPHAGE) 500 MG tablet Two (2) times a day.     ??? metoprolol succinate (TOPROL-XL) 50 MG 24 hr tablet      ??? metoprolol-hydrochlorothiazide (LOPRESSOR HCT) 50-25 mg per tablet Take 1 tablet by mouth once daily     ??? omeprazole (PRILOSEC) 40 MG capsule 20 mg.      ??? tamsulosin (FLOMAX) 0.4 mg capsule Take by mouth.     ??? HUMIRA PEN CITRATE FREE 40 MG/0.4 ML Inject the contents of 2 pens (80mg ) under the skin once for initial dose. (Patient not  taking: No sig reported) 2 each 0   ??? HUMIRA PEN CITRATE FREE 40 MG/0.4 ML Inject the contents of 1 pen (40mg ) under the skin every other week beginning 1 week after initial dose. (Patient not taking: No sig reported) 4 each PRN     No current facility-administered medications on file prior to visit.       ALLERGIES:   Shellfish  Isosorbide    SOCIAL HISTORY:  Accompanied by significant other    FAMILY HISTORY:  Reviewed     REVIEW OF SYSTEMS:  Baseline state of health. No recent illnesses. No other skin complaints.     PHYSICAL EXAMINATION:  Examination in the presence of chaperone:  General: Well-developed, well-nourished. No acute distress.   Neuro: Alert and oriented, answers questions appropriately.  Skin: Examination of the scalp, face, head, neck, chest, back, upper extremities, lower extremities, hands, palms, soles was performed and notable for the following:  Multiple well defined deeply erythematous plaques with scale on the bilateral lower legs and abdomen     Dictation software was used while making this note. Please excuse any errors made with dictation software.

## 2021-01-21 ENCOUNTER — Encounter: Admit: 2021-01-21 | Discharge: 2021-01-22 | Payer: MEDICARE

## 2021-01-21 DIAGNOSIS — L409 Psoriasis, unspecified: Principal | ICD-10-CM

## 2021-01-21 DIAGNOSIS — Z79899 Other long term (current) drug therapy: Principal | ICD-10-CM

## 2021-01-21 MED ORDER — CLOBETASOL 0.05 % TOPICAL OINTMENT
OPHTHALMIC | 1 refills | 0.00000 days | Status: CP
Start: 2021-01-21 — End: ?

## 2021-01-21 NOTE — Unmapped (Signed)
It was nice to see you today! Your resident physician was Dr. Paislee Szatkowski    If any of your medications are too expensive, look for a coupon at GoodRx.com or reference the application on a smartphone  - Enter the medication name, size, and your zip code to find coupons for local pharmacies.   - Print a coupon and bring it to the pharmacy, or pull up the coupon on a smartphone.  - You can also call pharmacies to ask about the cost of your medication before you pick it up.  If you still cannot afford your medication, please let us know.     Please call our clinic at [phone] with any concerns. We look forward to seeing you again!    [facility] releases most results to you as soon as they are available. Therefore, you may see some results before we do. Please give us 2 business days to review the tests and contact you by phone or through MyChart. If you are concerned that some results may be upsetting or confusing, you may wish to wait until we contact you before looking at the report in MyChart.  If you have an urgent question, you can send us a message or call our clinic. Otherwise, we prefer that you wait 2 business days for us to contact you.

## 2021-01-22 DIAGNOSIS — L409 Psoriasis, unspecified: Principal | ICD-10-CM

## 2021-01-22 DIAGNOSIS — L4050 Arthropathic psoriasis, unspecified: Principal | ICD-10-CM

## 2021-05-08 ENCOUNTER — Ambulatory Visit: Payer: Medicare Other | Admitting: Urology

## 2021-05-08 ENCOUNTER — Encounter: Payer: Self-pay | Admitting: Urology

## 2021-05-08 ENCOUNTER — Other Ambulatory Visit: Payer: Self-pay

## 2021-05-08 VITALS — BP 158/83 | HR 74 | Ht 67.0 in | Wt 188.0 lb

## 2021-05-08 DIAGNOSIS — N401 Enlarged prostate with lower urinary tract symptoms: Secondary | ICD-10-CM

## 2021-05-08 LAB — BLADDER SCAN AMB NON-IMAGING: Scan Result: 210

## 2021-05-08 NOTE — Progress Notes (Signed)
05/08/2021 10:01 AM   Brian Callahan [birth year] [id number]  Referring provider: Idelle Crouch, MD [facility] [place],  [place] [address]  Chief Complaint  Patient presents with   Benign Prostatic Hypertrophy    Urologic history:  1.  BPH with LUTS UroLift 01/23/2020; preop PVR 259 1 month postop visit IPSS 7/PVR 44 9 month visit IPSS 15/PVR 268; follow-up PVR 1 month later 72cc   HPI: 80 y.o. male presents for semiannual follow-up.  Intermittent symptoms; at times he notes decreased force and caliber of his urinary stream however at other times he voids without problems Remains on tamsulosin Denies dysuria, gross hematuria No flank, abdominal or pelvic pain   PMH: Past Medical History:  Diagnosis Date   Anemia    pt denies 01/18/20   BP (high blood pressure) 05/23/2015   BPH with obstruction/lower urinary tract symptoms    COPD (chronic obstructive pulmonary disease) ([facility])    Diabetes mellitus without complication ([facility])    diet controlled   Dyspnea    Encounter for long-term (current) use of other high-risk medications    GERD (gastroesophageal reflux disease)    gastritis   GI bleed    Gout    H/O hernia repair mid-90s   x2   History of elevated PSA    History of Rocky Mountain spotted fever    HLD (hyperlipidemia)    HTN (hypertension)    Hyperglycemia    Hyperuricemia    Incomplete bladder emptying    Macular degeneration    Obesity    Psoriasis    Psoriasis    Urinary frequency     Surgical History: Past Surgical History:  Procedure Laterality Date   APPENDECTOMY     BACK SURGERY     neck and spine 2007   CERVICAL SPINE SURGERY  2007   CHOLECYSTECTOMY N/A 10/18/2017   Procedure: LAPAROSCOPIC CHOLECYSTECTOMY;  Surgeon: [name], MD;  Location: [facility] ORS;  Service: General;  Laterality: N/A;   COLECTOMY  1998   partial, due to obstruction   COLONOSCOPY     lost 10 units of blood   COLONOSCOPY WITH  PROPOFOL N/A 09/27/2019   Procedure: COLONOSCOPY WITH PROPOFOL;  Surgeon: [name], [name], MD;  Location: [facility] ENDOSCOPY;  Service: Gastroenterology;  Laterality: N/A;   CYSTOSCOPY WITH INSERTION OF UROLIFT N/A 01/23/2020   Procedure: CYSTOSCOPY WITH INSERTION OF UROLIFT;  Surgeon: [name], MD;  Location: [facility] OR[facility];  Service: Urology;  Laterality: N/A;   ESOPHAGOGASTRODUODENOSCOPY N/A 10/14/2016   Procedure: ESOPHAGOGASTRODUODENOSCOPY (EGD);  Surgeon: [name], MD;  Location[facility];  Service: Endoscopy;  Laterality: N/A;   ESOPHAGOGASTRODUODENOSCOPY (EGD) WITH PROPOFOL N/A 09/27/2019   Procedure: ESOPHAGOGASTRODUODENOSCOPY (EGD) WITH PROPOFOL;  Surgeon: [name], [name], MD;  Location: [facility] ENDOSCOPY;  Service: Gastroenterology;  Laterality: N/A;   EYE SURGERY     bilateral cataracts   HERNIA REPAIR  mid-90s   x2  inguinal   PROSTATE BIOPSY  2013   SURGERY SCROTAL / TESTICULAR     for fibrous growth    Home Medications:  Allergies as of 05/08/2021       Reactions   Isosorbide Nitrate Other (See Comments)   unkn   Shellfish Allergy Other (See Comments)   gout   Trazodone And Nefazodone Diarrhea        Medication List        Accurate as of May 08, 2021 10:01 AM. If you have any questions, ask  your nurse or doctor.          acetaminophen 500 MG tablet Commonly known as: TYLENOL Take 500-1,000 mg by mouth every 6 (six) hours as needed (for headache/minor pain.).   clobetasol ointment 0.05 % Commonly known as: TEMOVATE Apply 1 application topically daily as needed (psoriasis).   enalapril 5 MG tablet Commonly known as: VASOTEC Take 1 tablet (5 mg total) by mouth 2 (two) times daily.   ezetimibe 10 MG tablet Commonly known as: ZETIA Take 10 mg by mouth daily.   finasteride 5 MG tablet Commonly known as: PROSCAR Take 5 mg by mouth daily.   Melatonin 10 MG Tabs Take 10 mg by mouth at bedtime as needed (sleeo).   metFORMIN 500 MG  tablet Commonly known as: GLUCOPHAGE Take 1 tablet by mouth 2 (two) times daily.   metoprolol succinate 50 MG 24 hr tablet Commonly known as: TOPROL-XL Take 1.5 tablets by mouth daily.   tamsulosin 0.4 MG Caps capsule Commonly known as: FLOMAX TAKE 1 CAPSULE BY MOUTH  TWICE DAILY   TART CHERRY ADVANCED PO Take 12 mg by mouth daily.        Allergies:  Allergies  Allergen Reactions   Isosorbide Nitrate Other (See Comments)    unkn   Shellfish Allergy Other (See Comments)    gout   Trazodone And Nefazodone Diarrhea    Family History: Family History  Problem Relation Age of Onset   Kidney failure Brother    Prostate cancer Neg Hx     Social History:  reports that he quit smoking about 30 years ago. His smoking use included cigarettes. He has a 2.50 pack-year smoking history. He has never used smokeless tobacco. He reports current alcohol use of about 18.0 standard drinks per week. He reports that he does not use drugs.   Physical Exam: There were no vitals taken for this visit.  Constitutional:  Alert and oriented, No acute distress. HEENT: [place] AT, moist mucus membranes.  Trachea midline, no masses. Cardiovascular: No clubbing, cyanosis, or edema. Respiratory: Normal respiratory effort, no increased work of breathing. Psychiatric: Normal mood and affect.   Assessment & Plan:    1.  BPH with LUTS Bladder scan PVR today 210 mL Additional outlet procedures were discussed including TURP however he states his current symptoms are not bothersome and he does not desire additional treatment at this time 6 month follow-up with repeat bladder scan Call earlier for worsening voiding symptoms   [name], MD  [facility] [address] [place], [place] [address] [phone]

## 2021-09-02 DIAGNOSIS — L409 Psoriasis, unspecified: Principal | ICD-10-CM

## 2021-09-02 MED ORDER — CLOBETASOL 0.05 % TOPICAL OINTMENT
1 refills | 0.00000 days
Start: 2021-09-02 — End: ?

## 2021-09-03 MED ORDER — CLOBETASOL 0.05 % TOPICAL OINTMENT
OPHTHALMIC | 1 refills | 0.00000 days | Status: CP
Start: 2021-09-03 — End: ?

## 2021-09-03 NOTE — Unmapped (Signed)
Refill request for Clobetasol ointment 0.05%  Patient last seen in clinic 01/21/21  Request sent for your review

## 2021-09-24 DIAGNOSIS — L409 Psoriasis, unspecified: Principal | ICD-10-CM

## 2021-09-24 MED ORDER — CLOBETASOL 0.05 % TOPICAL OINTMENT
OPHTHALMIC | 0 refills | 0.00000 days | Status: CP
Start: 2021-09-24 — End: ?

## 2021-09-24 NOTE — Unmapped (Signed)
Fax received refill request for clobetasol. LOV 01/21/2021.

## 2021-10-06 DIAGNOSIS — L409 Psoriasis, unspecified: Principal | ICD-10-CM

## 2021-10-06 MED ORDER — CLOBETASOL 0.05 % TOPICAL OINTMENT
1 refills | 0 days | Status: CP
Start: 2021-10-06 — End: ?

## 2021-10-16 ENCOUNTER — Other Ambulatory Visit: Payer: Self-pay | Admitting: Urology

## 2021-10-16 DIAGNOSIS — N138 Other obstructive and reflux uropathy: Secondary | ICD-10-CM

## 2021-11-06 ENCOUNTER — Ambulatory Visit: Payer: Medicare Other | Admitting: Urology

## 2022-01-12 ENCOUNTER — Other Ambulatory Visit: Payer: Self-pay | Admitting: Urology

## 2022-01-12 DIAGNOSIS — N138 Other obstructive and reflux uropathy: Secondary | ICD-10-CM

## 2022-03-06 ENCOUNTER — Other Ambulatory Visit: Payer: Self-pay | Admitting: Internal Medicine

## 2022-03-06 DIAGNOSIS — R413 Other amnesia: Secondary | ICD-10-CM

## 2022-03-06 DIAGNOSIS — R269 Unspecified abnormalities of gait and mobility: Secondary | ICD-10-CM

## 2022-03-16 ENCOUNTER — Encounter: Payer: Self-pay | Admitting: Urology

## 2022-03-17 NOTE — Progress Notes (Unsigned)
03/18/2022 4:21 PM   Brian Callahan 04-06-1941 [id number]  Referring provider: Idelle Crouch, MD [place] [facility] [place],  [place] [address]  Urological history: 1. BPH with LU TS -PSA, 01/2022 - 7.55 -cysto, 12/2019 - Cystoscopy recently performed by Dr. Diamantina Providence remarkable for lateral lobe enlargement and elevated bladder neck without median lobe -Prostate volume by MRI 33 g -UroLift, 12/2019 x 4 implants  -I PSS *** -PVR *** -Tamsulosin 0.4 mg daily  2. Elevated PSA bx in 2013 negative - unknown PSA level            PSA, 4.1 - 10/2019 - prostate MRI - negative   Component    PSA (Prostate Specific Antigen), Total   Component 02/19/2022 11/28/2021 08/29/2021  07/25/2015  07/17/2014  01/03/2014 06/30/2013            PSA (Prostate Specific Antigen), Total 7.55 High    5.03 High    5.39 High    1.73 2.24 4.66 Unknown Abnormality    4.25 Unknown Abnormality        No chief complaint on file.   HPI: Brian Callahan is a 81 y.o. male who presents today for an elevated PSA.    PSA trend as above.    UA *** PVR ***   PMH: Past Medical History:  Diagnosis Date   Anemia    pt denies 01/18/20   BP (high blood pressure) 05/23/2015   BPH with obstruction/lower urinary tract symptoms    COPD (chronic obstructive pulmonary disease) (HCC)    Diabetes mellitus without complication (HCC)    diet controlled   Dyspnea    Encounter for long-term (current) use of other high-risk medications    GERD (gastroesophageal reflux disease)    gastritis   GI bleed    Gout    H/O hernia repair mid-90s   x2   History of elevated PSA    History of [place] spotted fever    HLD (hyperlipidemia)    HTN (hypertension)    Hyperglycemia    Hyperuricemia    Incomplete bladder emptying    Macular degeneration    Obesity    Psoriasis    Psoriasis    Urinary frequency     Surgical History: Past Surgical History:  Procedure Laterality Date    APPENDECTOMY     BACK SURGERY     neck and spine 2007   CERVICAL SPINE SURGERY  2007   CHOLECYSTECTOMY N/A 10/18/2017   Procedure: LAPAROSCOPIC CHOLECYSTECTOMY;  Surgeon: [name], MD;  Location: [facility] ORS;  Service: General;  Laterality: N/A;   COLECTOMY  1998   partial, due to obstruction   COLONOSCOPY     lost 10 units of blood   COLONOSCOPY WITH PROPOFOL N/A 09/27/2019   Procedure: COLONOSCOPY WITH PROPOFOL;  Surgeon: [name], [name], MD;  Location: [facility] ENDOSCOPY;  Service: Gastroenterology;  Laterality: N/A;   CYSTOSCOPY WITH INSERTION OF UROLIFT N/A 01/23/2020   Procedure: CYSTOSCOPY WITH INSERTION OF UROLIFT;  Surgeon: [name], MD;  Location: [facility] ORS;  Service: Urology;  Laterality: N/A;   ESOPHAGOGASTRODUODENOSCOPY N/A 10/14/2016   Procedure: ESOPHAGOGASTRODUODENOSCOPY (EGD);  Surgeon: [name], MD;  Location: [facility] END[facility]COPY;  Service: Endoscopy;  Laterality: N/A;   ESOPHAGOGASTRODUODENOSCOPY (EGD) WITH PROPOFOL N/A 09/27/2019   Procedure: ESOPHAGOGASTRODUODENOSCOPY (EGD) WITH PROPOFOL;  Surgeon: [name], [name], MD;  Location: [facility] ENDOSCOPY;  Service: Gastroenterology;  Laterality: N/A;   EYE SURGERY     bilateral  cataracts   HERNIA REPAIR  mid-90s   x2  inguinal   PROSTATE BIOPSY  2013   SURGERY SCROTAL / TESTICULAR     for fibrous growth    Home Medications:  Allergies as of 03/18/2022       Reactions   Isosorbide Nitrate Other (See Comments)   unkn   Shellfish Allergy Other (See Comments)   gout   Trazodone And Nefazodone Diarrhea        Medication List        Accurate as of March 17, 2022  4:21 PM. If you have any questions, ask your nurse or doctor.          acetaminophen 500 MG tablet Commonly known as: TYLENOL Take 500-1,000 mg by mouth every 6 (six) hours as needed (for headache/minor pain.).   clobetasol ointment 0.05 % Commonly known as: TEMOVATE Apply 1 application topically daily as needed (psoriasis).    enalapril 5 MG tablet Commonly known as: VASOTEC Take 1 tablet (5 mg total) by mouth 2 (two) times daily.   ezetimibe 10 MG tablet Commonly known as: ZETIA Take 10 mg by mouth daily.   finasteride 5 MG tablet Commonly known as: PROSCAR Take 5 mg by mouth daily.   Melatonin 10 MG Tabs Take 10 mg by mouth at bedtime as needed (sleeo).   metFORMIN 500 MG tablet Commonly known as: GLUCOPHAGE Take 1 tablet by mouth 2 (two) times daily.   metoprolol succinate 50 MG 24 hr tablet Commonly known as: TOPROL-XL Take 1.5 tablets by mouth daily.   tamsulosin 0.4 MG Caps capsule Commonly known as: FLOMAX TAKE 1 CAPSULE BY MOUTH TWICE  DAILY   TART CHERRY ADVANCED PO Take 12 mg by mouth daily.        Allergies:  Allergies  Allergen Reactions   Isosorbide Nitrate Other (See Comments)    unkn   Shellfish Allergy Other (See Comments)    gout   Trazodone And Nefazodone Diarrhea    Family History: Family History  Problem Relation Age of Onset   Kidney failure Brother    Prostate cancer Neg Hx     Social History:  reports that he quit smoking about 31 years ago. His smoking use included cigarettes. He has a 2.50 pack-year smoking history. He has never used smokeless tobacco. He reports current alcohol use of about 18.0 standard drinks of alcohol per week. He reports that he does not use drugs.  ROS: Pertinent ROS in HPI  Physical Exam: There were no vitals taken for this visit.  Constitutional:  Well nourished. Alert and oriented, No acute distress. HEENT: [place] AT, moist mucus membranes.  Trachea midline, no masses. Cardiovascular: No clubbing, cyanosis, or edema. Respiratory: Normal respiratory effort, no increased work of breathing. GI: Abdomen is soft, non tender, non distended, no abdominal masses. Liver and spleen not palpable.  No hernias appreciated.  Stool sample for occult testing is not indicated.   GU: No CVA tenderness.  No bladder fullness or masses.  Patient  with circumcised/uncircumcised phallus. ***Foreskin easily retracted***  Urethral meatus is patent.  No penile discharge. No penile lesions or rashes. Scrotum without lesions, cysts, rashes and/or edema.  Testicles are located scrotally bilaterally. No masses are appreciated in the testicles. Left and right epididymis are normal. Rectal: Patient with  normal sphincter tone. Anus and perineum without scarring or rashes. No rectal masses are appreciated. Prostate is approximately *** grams, *** nodules are appreciated. Seminal vesicles are normal. Skin: No rashes, bruises  or suspicious lesions. Lymph: No cervical or inguinal adenopathy. Neurologic: Grossly intact, no focal deficits, moving all 4 extremities. Psychiatric: Normal mood and affect.  Laboratory Data: Thyroid Stimulating Hormone (TSH) 0.450-5.330 uIU/ml uIU/mL 3.930   Resulting Agency  [facility] - LAB   Specimen Collected: 03/05/22 11:25   Performed by: [facility] - LAB Last Resulted: 03/05/22 16:17  Received From: [place]  Result Received: 03/06/22 08:52   WBC (White Blood Cell Count) 4.1 - 10.2 10^3/uL 5.2   RBC (Red Blood Cell Count) 4.69 - 6.13 10^6/uL 4.08 Low    Hemoglobin 14.1 - 18.1 gm/dL 13.4 Low    Hematocrit 40.0 - 52.0 % 39.6 Low    MCV (Mean Corpuscular Volume) 80.0 - 100.0 fl 97.1   MCH (Mean Corpuscular Hemoglobin) 27.0 - 31.2 pg 32.8 High    MCHC (Mean Corpuscular Hemoglobin Concentration) 32.0 - 36.0 gm/dL 33.8   Platelet Count 150 - 450 10^3/uL 206   RDW-CV (Red Cell Distribution Width) 11.6 - 14.8 % 15.1 High    MPV (Mean Platelet Volume) 9.4 - 12.4 fl 9.7   Neutrophils 1.50 - 7.80 10^3/uL 3.38   Lymphocytes 1.00 - 3.60 10^3/uL 0.99 Low    Monocytes 0.00 - 1.50 10^3/uL 0.61   Eosinophils 0.00 - 0.55 10^3/uL 0.16   Basophils 0.00 - 0.09 10^3/uL 0.02   Neutrophil % 32.0 - 70.0 % 65.4   Lymphocyte % 10.0 - 50.0 % 19.1   Monocyte % 4.0 - 13.0 % 11.8   Eosinophil % 1.0  - 5.0 % 3.1   Basophil% 0.0 - 2.0 % 0.4   Immature Granulocyte % <=0.7 % 0.2   Immature Granulocyte Count <=0.06 10^3/L 0.01   Resulting Agency  [place] - LAB   Specimen Collected: 02/19/22 09:37   Performed by: [place] - LAB Last Resulted: 02/19/22 10:08  Received From: [place]  Result Received: 02/24/22 13:43   Cholesterol, Total 100 - 200 mg/dL 125   Triglyceride 35 - 199 mg/dL 80   HDL (High Density Lipoprotein) Cholesterol 29.0 - 71.0 mg/dL 49.4   LDL Calculated 0 - 130 mg/dL 60   VLDL Cholesterol mg/dL 16   Cholesterol/HDL Ratio  2.5   Resulting Agency  [place] - LAB   Specimen Collected: 02/19/22 09:37   Performed by: [place] - LAB Last Resulted: 02/19/22 13:32  Received From: [place]  Result Received: 02/24/22 13:43   Glucose 70 - 110 mg/dL 98   Sodium 136 - 145 mmol/L 136   Potassium 3.6 - 5.1 mmol/L 4.8   Chloride 97 - 109 mmol/L 97   Carbon Dioxide (CO2) 22.0 - 32.0 mmol/L 32.4 High    Urea Nitrogen (BUN) 7 - 25 mg/dL 6 Low    Creatinine 0.7 - 1.3 mg/dL 0.7   Glomerular Filtration Rate (eGFR), MDRD Estimate >60 mL/min/1.73sq m 109   Calcium 8.7 - 10.3 mg/dL 9.0   AST  8 - 39 U/L 19   ALT  6 - 57 U/L 14   Alk Phos (alkaline Phosphatase) 34 - 104 U/L 64   Albumin 3.5 - 4.8 g/dL 4.2   Bilirubin, Total 0.3 - 1.2 mg/dL 0.5   Protein, Total 6.1 - 7.9 g/dL 7.0   A/G Ratio 1.0 - 5.0 gm/dL 1.5   Resulting Agency  [place] - LAB   Specimen Collected: 02/19/22 09:37   Performed by: [facility] [place] - LAB  Last Resulted: 02/19/22 13:34  Received From: [place]  Result Received: 02/24/22 13:43   PSA (Prostate Specific Antigen), Total 0.10 - 4.00 ng/mL 7.55 High    Resulting Agency  [place] - LAB  Narrative Performed by [facility] - LAB Test results were determined with Beckman Coulter Hybritech Assay. Values obtained  with different assay methods cannot be used interchangeably in serial testing. Assay results should not be interpreted as absolute evidence of the presence or absence of malignant disease  Specimen Collected: 02/19/22 09:37   Performed by: [place]: 02/19/22 12:07  Received From: [place]  Result Received: 02/24/22 13:43   Hemoglobin A1C 4.2 - 5.6 % 6.5 High    Average Blood Glucose (Calc) mg/dL 140   Resulting [place]  Narrative Performed by [facility] - LAB Normal Range:    4.2 - 5.6%  Increased Risk:  5.7 - 6.4%  Diabetes:        >= 6.5%  Glycemic Control for adults with diabetes:  <7%    Specimen Collected: 02/19/22 09:37   Performed by: [place]: 02/19/22 10:45  Received From: [place]  Result Received: 02/24/22 13:43   Urinalysis *** I have reviewed the labs.   Pertinent Imaging: ***   Assessment & Plan:  ***  1. Elevated PSA -free and total PSA  -UA *** -urine culture pending -We reviewed the implications of an elevated PSA and the uncertainty surrounding it. In general, a man's PSA increases with age and is produced by both normal and cancerous prostate tissue. The differential diagnosis for elevated PSA includes BPH, prostate cancer, infection, recent intercourse/ejaculation, recent urethroscopic manipulation (foley placement/cystoscopy) or trauma, and prostatitis.  -Management of an elevated PSA can include observation or prostate biopsy and we discussed this in detail. Our goal is to detect clinically significant prostate cancers, and manage with either active surveillance, surgery, or radiation for localized disease. Risks of prostate biopsy include bleeding, infection (including life threatening sepsis), pain, and lower urinary symptoms. Hematuria, hematospermia, and blood in the stool are all common after biopsy and can  persist up to 4 weeks.    2. BPH with LUTS -PSA elevated - see #1 -DRE benign -UA benign -PVR < 300 cc -symptoms - *** -most bothersome symptoms are *** -continue conservative management, avoiding bladder irritants and timed voiding's -Initiate alpha-blocker (***), discussed side effects *** -Initiate 5 alpha reductase inhibitor (***), discussed side effects *** -Continue tamsulosin 0.4 mg daily, alfuzosin 10 mg daily, Rapaflo 8 mg daily, terazosin, doxazosin, Cialis 5 mg daily and finasteride 5 mg daily, dutasteride 0.5 mg daily***:refills given -Cannot tolerate medication or medication failure, schedule cystoscopy ***     No follow-ups on file.  These notes generated with voice recognition software. I apologize for typographical errors.  Royden Purl  [facility] [address]  [place], [place] [address] [phone]

## 2022-03-18 ENCOUNTER — Ambulatory Visit: Payer: Medicare Other | Admitting: Urology

## 2022-03-18 ENCOUNTER — Encounter: Payer: Self-pay | Admitting: Urology

## 2022-03-18 VITALS — BP 180/81 | HR 78 | Ht 67.0 in | Wt 184.0 lb

## 2022-03-18 DIAGNOSIS — R972 Elevated prostate specific antigen [PSA]: Secondary | ICD-10-CM

## 2022-03-18 DIAGNOSIS — N401 Enlarged prostate with lower urinary tract symptoms: Secondary | ICD-10-CM | POA: Diagnosis not present

## 2022-03-18 DIAGNOSIS — N138 Other obstructive and reflux uropathy: Secondary | ICD-10-CM

## 2022-03-18 LAB — URINALYSIS, COMPLETE
Bilirubin, UA: NEGATIVE
Glucose, UA: NEGATIVE
Ketones, UA: NEGATIVE
Leukocytes,UA: NEGATIVE
Nitrite, UA: NEGATIVE
Protein,UA: NEGATIVE
RBC, UA: NEGATIVE
Specific Gravity, UA: 1.015 (ref 1.005–1.030)
Urobilinogen, Ur: 0.2 mg/dL (ref 0.2–1.0)
pH, UA: 6 (ref 5.0–7.5)

## 2022-03-18 LAB — MICROSCOPIC EXAMINATION: Bacteria, UA: NONE SEEN

## 2022-03-19 ENCOUNTER — Telehealth: Payer: Self-pay

## 2022-03-19 ENCOUNTER — Other Ambulatory Visit: Payer: Self-pay | Admitting: Urology

## 2022-03-19 DIAGNOSIS — R972 Elevated prostate specific antigen [PSA]: Secondary | ICD-10-CM

## 2022-03-19 LAB — PSA, TOTAL AND FREE
PSA, Free Pct: 9.6 %
PSA, Free: 0.72 ng/mL
Prostate Specific Ag, Serum: 7.5 ng/mL — ABNORMAL HIGH (ref 0.0–4.0)

## 2022-03-19 NOTE — Progress Notes (Unsigned)
I put an order in for a prostate MRI.  He is also having a MRI of his head.  He has had UroLift and he has some other medal in his body.  He has been cleared for the head MRI, but I do not know if that clearance would cross over for the prostate MRI.

## 2022-03-19 NOTE — Telephone Encounter (Signed)
-----   Message from Nori Riis, PA-C sent at 03/19/2022 10:14 AM EDT ----- Please let Brian Callahan know that his repeat blood work is concerning for prostate cancer and we should go ahead with the prostate MRI.

## 2022-03-19 NOTE — Telephone Encounter (Signed)
Attempted to reach pt, he was unavailable. Sent pt Mychart msg along with prostate MRI prep instructions, see separate encounter.

## 2022-03-21 ENCOUNTER — Other Ambulatory Visit: Payer: Self-pay | Admitting: Urology

## 2022-03-21 DIAGNOSIS — R972 Elevated prostate specific antigen [PSA]: Secondary | ICD-10-CM

## 2022-03-21 DIAGNOSIS — N390 Urinary tract infection, site not specified: Secondary | ICD-10-CM

## 2022-03-21 LAB — CULTURE, URINE COMPREHENSIVE

## 2022-03-21 MED ORDER — CIPROFLOXACIN HCL 250 MG PO TABS: 250.0000 mg | ORAL_TABLET | Freq: Two times a day (BID) | ORAL | 0 refills | Status: DC

## 2022-03-23 ENCOUNTER — Telehealth: Payer: Self-pay

## 2022-03-23 ENCOUNTER — Other Ambulatory Visit: Payer: Self-pay

## 2022-03-23 DIAGNOSIS — R972 Elevated prostate specific antigen [PSA]: Secondary | ICD-10-CM

## 2022-03-23 NOTE — Telephone Encounter (Signed)
-----   Message from Nori Riis, PA-C sent at 03/21/2022 10:15 AM EDT ----- Please schedule Mr. Dornbush for a PSA in one month.

## 2022-03-23 NOTE — Telephone Encounter (Signed)
Appt made, orders placed.

## 2022-03-24 ENCOUNTER — Other Ambulatory Visit: Payer: Self-pay

## 2022-03-24 DIAGNOSIS — R972 Elevated prostate specific antigen [PSA]: Secondary | ICD-10-CM

## 2022-03-24 NOTE — Addendum Note (Signed)
Addended by: Zara Council A on: 03/24/2022 02:34 PM   Modules accepted: Orders

## 2022-04-20 ENCOUNTER — Other Ambulatory Visit: Payer: Medicare Other

## 2022-04-20 DIAGNOSIS — R972 Elevated prostate specific antigen [PSA]: Secondary | ICD-10-CM

## 2022-04-21 LAB — PSA, TOTAL AND FREE
PSA, Free Pct: 9.5 %
PSA, Free: 0.54 ng/mL
Prostate Specific Ag, Serum: 5.7 ng/mL — ABNORMAL HIGH (ref 0.0–4.0)

## 2022-05-04 ENCOUNTER — Other Ambulatory Visit: Payer: Self-pay | Admitting: Urology

## 2022-05-04 ENCOUNTER — Telehealth: Payer: Self-pay

## 2022-05-04 DIAGNOSIS — R972 Elevated prostate specific antigen [PSA]: Secondary | ICD-10-CM

## 2022-05-04 NOTE — Telephone Encounter (Signed)
-----   Message from Nori Riis, PA-C sent at 05/04/2022  1:13 PM EDT ----- I sent a MyChart message to Brian Callahan regarding his free and total PSA results.  I recommended that he either have the free and total PSA repeated in three months or proceed with a prostate MRI.  I have not heard back from him.  Would you ask him how he would like to proceed?

## 2022-05-04 NOTE — Telephone Encounter (Signed)
Pt would like to proceed with prostate MRI.

## 2022-05-19 ENCOUNTER — Ambulatory Visit
Admission: RE | Admit: 2022-05-19 | Discharge: 2022-05-19 | Disposition: A | Discharge status: Home / Self Care | Payer: Medicare Other | Source: Ambulatory Visit | Attending: Urology | Admitting: Urology

## 2022-05-19 DIAGNOSIS — R972 Elevated prostate specific antigen [PSA]: Secondary | ICD-10-CM | POA: Insufficient documentation

## 2022-05-19 MED ORDER — GADOBUTROL 1 MMOL/ML IV SOLN
7.5000 mL | Freq: Once | INTRAVENOUS | Status: AC | PRN
  Administered 2022-05-19: 7.5 mL via INTRAVENOUS

## 2022-05-21 ENCOUNTER — Other Ambulatory Visit: Payer: Self-pay | Admitting: Internal Medicine

## 2022-05-21 DIAGNOSIS — R413 Other amnesia: Secondary | ICD-10-CM

## 2022-05-21 DIAGNOSIS — R269 Unspecified abnormalities of gait and mobility: Secondary | ICD-10-CM

## 2022-05-25 ENCOUNTER — Other Ambulatory Visit: Payer: Medicare Other

## 2022-05-29 ENCOUNTER — Ambulatory Visit
Admission: RE | Admit: 2022-05-29 | Discharge: 2022-05-29 | Disposition: A | Discharge status: Home / Self Care | Payer: Medicare Other | Source: Ambulatory Visit | Attending: Internal Medicine | Admitting: Internal Medicine

## 2022-05-29 DIAGNOSIS — R413 Other amnesia: Secondary | ICD-10-CM | POA: Diagnosis present

## 2022-05-29 DIAGNOSIS — R269 Unspecified abnormalities of gait and mobility: Secondary | ICD-10-CM | POA: Diagnosis present

## 2022-06-08 NOTE — Progress Notes (Unsigned)
06/09/2022 3:04 PM   Brian Callahan 1941-02-04 [id number]  Referring provider: Idelle Crouch, MD [facility] [place],  [place] [address]  Urological history: 1. BPH with LU TS -PSA, 01/2022 - 7.55 -cysto, 12/2019 - Cystoscopy recently performed by Dr. Diamantina Providence remarkable for lateral lobe enlargement and elevated bladder neck without median lobe -Prostate volume by MRI 33 g -UroLift, 12/2019 x 4 implants  -I PSS 10/2 -PVR 280 mL -Tamsulosin 0.4 mg daily  2. Elevated PSA bx in 2013 negative - unknown PSA level            PSA, 4.1 - 10/2019 - prostate MRI - negative     No chief complaint on file.   HPI: Brian Callahan is a 81 y.o. male who presents today for further discussion.   He was seen in August 2023 for a PSA of 7.55 obtained by his PCP in July 2023.  Free and total PSA obtained March 18, 2022 noted a total PSA of 7.4, free percent PSA of 9.6 making the probability of having a prostate cancer greater than 55%.  His urine culture did return positive for Enterococcus coccus faecalis and he was treated with antibiotic and free and total PSA at that time indicated a PSA of 5.7 with a free percent PSA still at 9.5 making the probability of a prostate cancer greater than 55%.  He then decided to undergo a prostate MRI which found a category 5 lesion in the right anterior peripheral zone and anterior fibromuscular stroma at the apex.  He then had a 4K score which noted a score of 22.7 which indicates a 40% chance of having high-grade prostate cancer if biopsy was performed today.     PMH: Past Medical History:  Diagnosis Date   Anemia    pt denies 01/18/20   BP (high blood pressure) 05/23/2015   BPH with obstruction/lower urinary tract symptoms    COPD (chronic obstructive pulmonary disease) (HCC)    Diabetes mellitus without complication (HCC)    diet controlled   Dyspnea    Encounter for long-term (current) use of other high-risk  medications    GERD (gastroesophageal reflux disease)    gastritis   GI bleed    Gout    H/O hernia repair mid-90s   x2   History of elevated PSA    History of [place] spotted fever    HLD (hyperlipidemia)    HTN (hypertension)    Hyperglycemia    Hyperuricemia    Incomplete bladder emptying    Macular degeneration    Obesity    Psoriasis    Psoriasis    Urinary frequency     Surgical History: Past Surgical History:  Procedure Laterality Date   APPENDECTOMY     BACK SURGERY     neck and spine 2007   CERVICAL SPINE SURGERY  2007   CHOLECYSTECTOMY N/A 10/18/2017   Procedure: LAPAROSCOPIC CHOLECYSTECTOMY;  Surgeon: [name], MD;  Location: [facility] ORS;  Service: General;  Laterality: N/A;   COLECTOMY  1998   partial, due to obstruction   COLONOSCOPY     lost 10 units of blood   COLONOSCOPY WITH PROPOFOL N/A 09/27/2019   Procedure: COLONOSCOPY WITH PROPOFOL;  Surgeon: [name], [name], MD;  Location: [facility] ENDOSCOPY;  Service: Gastroenterology;  Laterality: N/A;   CYSTOSCOPY WITH INSERTION OF UROLIFT N/A 01/23/2020   Procedure: CYSTOSCOPY WITH INSERTION OF UROLIFT;  Surgeon: [name], MD;  Location:  [facility] ORS;  Service: Urology;  Laterality: N/A;   ESOPHAGOGASTRODUODENOSCOPY N/A 10/14/2016   Procedure: ESOPHAGOGASTRODUODENOSCOPY (EGD);  Surgeon: [name], MD;  Location: [facility] ENDOSCOPY;  Service: Endoscopy;  Laterality: N/A;   ESOPHAGOGASTRODUODENOSCOPY (EGD) WITH PROPOFOL N/A 09/27/2019   Procedure: ESOPHAGOGASTRODUODENOSCOPY (EGD) WITH PROPOFOL;  Surgeon: [name], [name], MD;  Location: [facility] ENDOSCOPY;  Service: Gastroenterology;  Laterality: N/A;   EYE SURGERY     bilateral cataracts   HERNIA REPAIR  mid-90s   x2  inguinal   PROSTATE BIOPSY  2013   SURGERY SCROTAL / TESTICULAR     for fibrous growth    Home Medications:  Allergies as of 06/09/2022       Reactions   Isosorbide Nitrate Other (See Comments)   unkn   Shellfish Allergy  Other (See Comments)   gout   Trazodone And Nefazodone Diarrhea        Medication List        Accurate as of June 08, 2022  3:04 PM. If you have any questions, ask your nurse or doctor.          acetaminophen 500 MG tablet Commonly known as: TYLENOL Take 500-1,000 mg by mouth every 6 (six) hours as needed (for headache/minor pain.).   ciprofloxacin 250 MG tablet Commonly known as: Cipro Take 1 tablet (250 mg total) by mouth 2 (two) times daily.   clobetasol ointment 0.05 % Commonly known as: TEMOVATE Apply 1 application topically daily as needed (psoriasis).   enalapril 5 MG tablet Commonly known as: VASOTEC Take 1 tablet (5 mg total) by mouth 2 (two) times daily.   ezetimibe 10 MG tablet Commonly known as: ZETIA Take 10 mg by mouth daily.   finasteride 5 MG tablet Commonly known as: PROSCAR Take 5 mg by mouth daily.   Melatonin 10 MG Tabs Take 10 mg by mouth at bedtime as needed (sleeo).   metFORMIN 500 MG tablet Commonly known as: GLUCOPHAGE Take 1 tablet by mouth 2 (two) times daily.   metoprolol succinate 50 MG 24 hr tablet Commonly known as: TOPROL-XL Take 1.5 tablets by mouth daily.   tamsulosin 0.4 MG Caps capsule Commonly known as: FLOMAX TAKE 1 CAPSULE BY MOUTH TWICE  DAILY   TART CHERRY ADVANCED PO Take 12 mg by mouth daily.        Allergies:  Allergies  Allergen Reactions   Isosorbide Nitrate Other (See Comments)    unkn   Shellfish Allergy Other (See Comments)    gout   Trazodone And Nefazodone Diarrhea    Family History: Family History  Problem Relation Age of Onset   Kidney failure Brother    Prostate cancer Neg Hx     Social History:  reports that he quit smoking about 31 years ago. His smoking use included cigarettes. He has a 2.50 pack-year smoking history. He has never used smokeless tobacco. He reports current alcohol use of about 18.0 standard drinks of alcohol per week. He reports that he does not use  drugs.  ROS: Pertinent ROS in HPI  Physical Exam: There were no vitals taken for this visit.  Constitutional:  Well nourished. Alert and oriented, No acute distress. HEENT: [place] AT, moist mucus membranes.  Trachea midline Cardiovascular: No clubbing, cyanosis, or edema. Respiratory: Normal respiratory effort, no increased work of breathing. GU: No CVA tenderness.  No bladder fullness or masses.  Patient with circumcised/uncircumcised phallus. ***Foreskin easily retracted***  Urethral meatus is patent.  No penile discharge. No penile lesions or  rashes. Scrotum without lesions, cysts, rashes and/or edema.  Testicles are located scrotally bilaterally. No masses are appreciated in the testicles. Left and right epididymis are normal. Rectal: Patient with  normal sphincter tone. Anus and perineum without scarring or rashes. No rectal masses are appreciated. Prostate is approximately *** grams, *** nodules are appreciated. Seminal vesicles are normal. Neurologic: Grossly intact, no focal deficits, moving all 4 extremities. Psychiatric: Normal mood and affect.   Laboratory Data: WBC (White Blood Cell Count) 4.1 - 10.2 10^3/uL 6.1  RBC (Red Blood Cell Count) 4.69 - 6.13 10^6/uL 3.91 Low   Hemoglobin 14.1 - 18.1 gm/dL 12.3 Low   Hematocrit 40.0 - 52.0 % 38.3 Low   MCV (Mean Corpuscular Volume) 80.0 - 100.0 fl 98.0  MCH (Mean Corpuscular Hemoglobin) 27.0 - 31.2 pg 31.5 High   MCHC (Mean Corpuscular Hemoglobin Concentration) 32.0 - 36.0 gm/dL 32.1  Platelet Count 150 - 450 10^3/uL 260  RDW-CV (Red Cell Distribution Width) 11.6 - 14.8 % 13.5  MPV (Mean Platelet Volume) 9.4 - 12.4 fl 10.1  Neutrophils 1.50 - 7.80 10^3/uL 3.97  Lymphocytes 1.00 - 3.60 10^3/uL 1.17  Monocytes 0.00 - 1.50 10^3/uL 0.69  Eosinophils 0.00 - 0.55 10^3/uL 0.24  Basophils 0.00 - 0.09 10^3/uL 0.02  Neutrophil % 32.0 - 70.0 % 65.1  Lymphocyte % 10.0 - 50.0 % 19.2  Monocyte % 4.0 - 13.0 % 11.3  Eosinophil % 1.0 - 5.0 % 3.9   Basophil% 0.0 - 2.0 % 0.3  Immature Granulocyte % <=0.7 % 0.2  Immature Granulocyte Count <=0.06 10^3/L 0.01  Resulting Agency  [place] - [facility]   Specimen Collected: 05/26/22 11:22   Performed by: [place] - [facility] Last Resulted: 05/26/22 13:26  Received From: [facility]  Result Received: 05/29/22 10:43   Glucose 70 - 110 mg/dL 101  Sodium 136 - 145 mmol/L 141  Potassium 3.6 - 5.1 mmol/L 4.1  Chloride 97 - 109 mmol/L 106  Carbon Dioxide (CO2) 22.0 - 32.0 mmol/L 31.6  Urea Nitrogen (BUN) 7 - 25 mg/dL 16  Creatinine 0.7 - 1.3 mg/dL 0.6 Low   Glomerular Filtration Rate (eGFR) >60 mL/min/1.73sq m 99  Comment: CKD-EPI (2021) does not include patient's race in the calculation of eGFR.  Monitoring changes of plasma creatinine and eGFR over time is useful for monitoring kidney function.  Interpretive Ranges for eGFR (CKD-EPI 2021):  eGFR:       >60 mL/min/1.73 sq. m - Normal eGFR:       30-59 mL/min/1.73 sq. m - Moderately Decreased eGFR:       15-29 mL/min/1.73 sq. m  - Severely Decreased eGFR:       < 15 mL/min/1.73 sq. m  - Kidney Failure   Note: These eGFR calculations do not apply in acute situations when eGFR is changing rapidly or patients on dialysis.  Calcium 8.7 - 10.3 mg/dL 9.0  AST 8 - 39 U/L 15  ALT 6 - 57 U/L 12  Alk Phos (alkaline Phosphatase) 34 - 104 U/L 61  Albumin 3.5 - 4.8 g/dL 4.3  Bilirubin, Total 0.3 - 1.2 mg/dL 0.5  Protein, Total 6.1 - 7.9 g/dL 6.8  A/G Ratio 1.0 - 5.0 gm/dL 1.7  Resulting Agency  [place] [facility] - [facility]   Specimen Collected: 05/26/22 11:22   Performed by: [place]: 05/26/22 16:20  Received From: [place]  Result Received: 05/29/22 10:43   Color Colorless, Straw, Light  Yellow, Yellow, Dark Yellow Light Yellow  Clarity Clear Clear  Specific Gravity 1.005 - 1.030 1.021  pH, Urine 5.0 - 8.0 5.5  Protein, Urinalysis Negative mg/dL Negative   Glucose, Urinalysis Negative mg/dL Negative  Ketones, Urinalysis Negative mg/dL Negative  Blood, Urinalysis Negative Negative  Nitrite, Urinalysis Negative Negative  Leukocyte Esterase, Urinalysis Negative Negative  Bilirubin, Urinalysis Negative Negative  Urobilinogen, Urinalysis 0.2 - 1.0 mg/dL 0.2  WBC, UA <=5 /hpf <1  Red Blood Cells, Urinalysis <=3 /hpf 1  Bacteria, Urinalysis 0 - 5 /hpf 0-5  Squamous Epithelial Cells, Urinalysis /hpf 0  Resulting Agency  [facility] - [facility]   Specimen Collected: 05/26/22 11:22   Performed by: [facility] Last Resulted: 05/26/22 12:12  Received From: [place]  Result Received: 05/29/22 10:43   Hemoglobin A1C 4.2 - 5.6 % 6.7 High   Average Blood Glucose (Calc) mg/dL [place] - [facility]  Narrative Performed by [place] - [facility] Normal Range:    4.2 - 5.6% Increased Risk:  5.7 - 6.4% Diabetes:        >= 6.5% Glycemic Control for adults with diabetes:  <7%    Specimen Collected: 05/26/22 11:22   Performed by: [place]: 05/26/22 13:47  Received From: [place]  Result Received: 05/29/22 10:43  I have reviewed the labs.   Pertinent Imaging: Narrative & Impression  CLINICAL DATA:  Elevated PSA level of 5.7 on 04/20/2022. Prior UroLift.   EXAM: MR PROSTATE WITHOUT AND WITH CONTRAST   TECHNIQUE: Multiplanar multisequence MRI images were obtained of the pelvis centered about the prostate. Pre and post contrast images were obtained.   CONTRAST:  7.36m GADAVIST GADOBUTROL 1 MMOL/ML IV SOLN   COMPARISON:  None Available.   FINDINGS: Prostate:   The prostate gland is obscured in the region of the UroLift mainly on the diffusion-weighted images.   Region of interest # 1: PI-RADS category 5 lesion of the right anterior peripheral zone and anterior fibromuscular stroma at the apex, with focally reduced  T2 signal (image 69, series 10) corresponding to low ADC map activity and restricted diffusion (image 23 of series 8 and 9) along with focal early enhancement (image 165 of series 13). This measures 0.54 cc (1.7 by 0.8 by 0.7 cm).   Encapsulated nodularity in the transition zone compatible with benign prostatic hypertrophy.   Urolift components noted.   Volume: 3D volumetric analysis: Prostate volume 60.03 cc (5.3 by 4.8 by 4.9 cm).   Transcapsular spread:  Absent   Seminal vesicle involvement: Absent   Neurovascular bundle involvement: Absent   Pelvic adenopathy: No pathologic adenopathy. Small perirectal lymph nodes, 0.5 cm bilaterally on image 36 series 15.   Bone metastasis: Absent   Other findings: Colonic diverticulosis. Degenerative arthropathy of the hips.   IMPRESSION: 1. PI-RADS category 5 lesion of the right anterior peripheral zone and anterior fibromuscular stroma at the apex. Targeting data sent to [facility] 2. Prostatomegaly and benign prostatic hypertrophy. 3. Colonic diverticulosis. 4. Degenerative arthropathy of the hips.     Electronically Signed   By: WVan ClinesM.D.   On: 05/20/2022 08:27    I have independently reviewed the films.  See HPI.      Assessment & Plan:    1. Elevated PSA -***  2. BPH with LUTS -PSA elevated - see #1 -DRE benign -UA benign -PVR < 300 cc -symptoms -  frequency  -continue conservative management, avoiding bladder irritants and timed voiding's -Continue tamsulosin 0.4 mg daily twice daily  No follow-ups on file.  These notes generated with voice recognition software. I apologize for typographical errors.  Royden Purl  [facility] [address]  [place], [place] [address] [phone]

## 2022-06-09 ENCOUNTER — Encounter: Payer: Self-pay | Admitting: Urology

## 2022-06-09 ENCOUNTER — Ambulatory Visit: Payer: Medicare Other | Admitting: Urology

## 2022-06-09 VITALS — BP 175/82 | HR 71 | Ht 67.0 in | Wt 180.0 lb

## 2022-06-09 DIAGNOSIS — R972 Elevated prostate specific antigen [PSA]: Secondary | ICD-10-CM | POA: Diagnosis not present

## 2022-06-09 DIAGNOSIS — N401 Enlarged prostate with lower urinary tract symptoms: Secondary | ICD-10-CM | POA: Diagnosis not present

## 2022-06-09 DIAGNOSIS — N138 Other obstructive and reflux uropathy: Secondary | ICD-10-CM | POA: Diagnosis not present

## 2022-06-09 DIAGNOSIS — R9389 Abnormal findings on diagnostic imaging of other specified body structures: Secondary | ICD-10-CM

## 2022-07-29 ENCOUNTER — Ambulatory Visit (INDEPENDENT_AMBULATORY_CARE_PROVIDER_SITE_OTHER): Payer: Medicare Other | Admitting: Urology

## 2022-07-29 ENCOUNTER — Encounter: Payer: Self-pay | Admitting: Urology

## 2022-07-29 DIAGNOSIS — C61 Malignant neoplasm of prostate: Secondary | ICD-10-CM

## 2022-07-29 NOTE — Progress Notes (Signed)
Virtual Visit via Telephone Note  I connected with Brian Callahan on 07/29/22 at  4:00 PM EST by telephone and verified that I am speaking with the correct person using two identifiers.  Location: Patient: Home Provider: Office   I discussed the limitations, risks, security and privacy concerns of performing an evaluation and management service by telephone and the availability of in person appointments. I also discussed with the patient that there may be a patient responsible charge related to this service. The patient expressed understanding and agreed to proceed.   History of Present Illness: Urological history: 1. BPH with LU TS -PSA, 01/2022 - 7.55 -cysto, 12/2019 - Cystoscopy recently performed by Dr. Diamantina Providence remarkable for lateral lobe enlargement and elevated bladder neck without median lobe -Prostate volume by MRI 60 g -UroLift, 12/2019 x 4 implants  -I PSS 10/2 -PVR 280 mL -Tamsulosin 0.4 mg daily  2. Elevated PSA -PSA (03/2022) 5.7 -prostate MRI (04/2022) - PI-RADS 5 lesion     Observations/Objective: He does not sound distressed and he is answering questions appropriately.  His wife, [name], is also present during the visit.    A fusion biopsy was performed on July 24, 2022.  The targeted lesion was biopsied x 4 and standard 12 biopsies were performed.  3 biopsies were obtained from the lateral part of each lobe and 3 biopsies were obtained from the medial part of each lobe.  Prostatic adenocarcinoma, Gleason score 4+3 (grade group 3) involving 50% of one of the 4 cores (pattern 4 equals 95%) of ROI.  High-grade PIN in the right apex and a focus of atypia in the right lateral apex.    The results were discussed with him and his wife.    Assessment and Plan: 1.  Unfavorable intermediate prostate cancer -we discussed the natural history of prostate cancer and the standard treatment options that are available for prostate cancer based on his age.  Because of his Gleason  score, I discussed the need to proceed with additional staging studies, as well as how that information influences recommended treatment strategies.  -will proceed with CT/PET (PSMA) scan  Follow Up Instructions:   I discussed the assessment and treatment plan with the patient. The patient was provided an opportunity to ask questions and all were answered. The patient agreed with the plan and demonstrated an understanding of the instructions.   The patient was advised to call back or seek an in-person evaluation if the symptoms worsen or if the condition fails to improve as anticipated.  I provided 30 minutes of non-face-to-face time during this encounter.   Edy Belt, PA-C

## 2022-08-03 ENCOUNTER — Ambulatory Visit: Payer: Medicare Other

## 2022-08-03 ENCOUNTER — Ambulatory Visit: Payer: Medicare Other | Attending: Neurology

## 2022-08-03 DIAGNOSIS — R262 Difficulty in walking, not elsewhere classified: Secondary | ICD-10-CM | POA: Diagnosis present

## 2022-08-03 DIAGNOSIS — M6281 Muscle weakness (generalized): Secondary | ICD-10-CM | POA: Diagnosis present

## 2022-08-03 NOTE — Therapy (Addendum)
OUTPATIENT PHYSICAL THERAPY NEURO EVALUATION   Patient Name: Brian Callahan MRN: [id number] DOB:December 29, 1940, 82 y.o., male Today's Date: 08/03/2022   PCP: [name], MD REFERRING PROVIDER: [name], MD  END OF SESSION:  PT End of Session - 08/03/22 1622     Visit Number 1    Number of Visits 24    Date for PT Re-Evaluation 10/26/22    PT Start Time 0900    PT Stop Time 0957    PT Time Calculation (min) 57 min    Equipment Utilized During Treatment Gait belt    Activity Tolerance Patient tolerated treatment well    Behavior During Therapy WFL for tasks assessed/performed             Past Medical History:  Diagnosis Date   Anemia    pt denies 01/18/20   BP (high blood pressure) 05/23/2015   BPH with obstruction/lower urinary tract symptoms    COPD (chronic obstructive pulmonary disease) ([place])    Diabetes mellitus without complication ([place])    diet controlled   Dyspnea    Encounter for long-term (current) use of other high-risk medications    GERD (gastroesophageal reflux disease)    gastritis   GI bleed    Gout    H/O hernia repair mid-90s   x2   History of elevated PSA    History of Rocky Mountain spotted fever    HLD (hyperlipidemia)    HTN (hypertension)    Hyperglycemia    Hyperuricemia    Incomplete bladder emptying    Macular degeneration    Obesity    Psoriasis    Psoriasis    Urinary frequency    Past Surgical History:  Procedure Laterality Date   APPENDECTOMY     BACK SURGERY     neck and spine 2007   CERVICAL SPINE SURGERY  2007   CHOLECYSTECTOMY N/A 10/18/2017   Procedure: LAPAROSCOPIC CHOLECYSTECTOMY;  Surgeon: [name], MD;  Location: [facility] ORS;  Service: General;  Laterality: N/A;   COLECTOMY  1998   partial, due to obstruction   COLONOSCOPY     lost 10 units of blood   COLONOSCOPY WITH PROPOFOL N/A 09/27/2019   Procedure: COLONOSCOPY WITH PROPOFOL;  Surgeon: [name], [name], MD;  Location: [facility] ENDOSCOPY;   Service: Gastroenterology;  Laterality: N/A;   CYSTOSCOPY WITH INSERTION OF UROLIFT N/A 01/23/2020   Procedure: CYSTOSCOPY WITH INSERTION OF UROLIFT;  Surgeon: [name], MD;  Location: [facility];  Service: Urology;  Laterality: N/A;   ESOPHAGOGASTRODUODENOSCOPY N/A 10/14/2016   Procedure: ESOPHAGOGASTRODUODENOSCOPY (EGD);  Surgeon: [name], MD;  Location: [facility];  Service: Endoscopy;  Laterality: N/A;   ESOPHAGOGASTRODUODENOSCOPY (EGD) WITH PROPOFOL N/A 09/27/2019   Procedure: ESOPHAGOGASTRODUODENOSCOPY (EGD) WITH PROPOFOL;  Surgeon: [name], [name], MD;  Location: [facility] ENDOSCOPY;  Service: Gastroenterology;  Laterality: N/A;   EYE SURGERY     bilateral cataracts   HERNIA REPAIR  mid-90s   x2  inguinal   PROSTATE BIOPSY  2013   SURGERY SCROTAL / TESTICULAR     for fibrous growth   Patient Active Problem List   Diagnosis Date Noted   Diabetes mellitus type 2 with complications ([place]) 05/23/2535   Chest pain with moderate risk for cardiac etiology 11/29/2017   Positive cardiac stress test 11/29/2017   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 11/08/2017   Esophagitis, reflux 11/08/2017   SOB (shortness of breath) on exertion 07/02/2017   BPH with obstruction/lower urinary tract symptoms 11/21/2015   History  of elevated PSA 11/21/2015   H/O gastric ulcer 09/05/2015   Disorder of eyeball 05/23/2015   H/O infectious disease 05/23/2015   Blood glucose elevated 05/23/2015   BP (high blood pressure) 05/23/2015   HLD (hyperlipidemia) 05/23/2015   Psoriasis 05/23/2015   Absolute anemia 01/02/2014   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   Elevated prostate specific antigen (PSA) 01/02/2014   Elevated blood uric acid level 01/02/2014   Enlarged prostate without lower urinary tract symptoms (luts) 01/02/2014   Bleeding gastrointestinal 04/15/2012    ONSET DATE: ~10 years  REFERRING DIAG:  R26.89 (ICD-10-CM) - Balance problem  R26.9 (ICD-10-CM) - Gait abnormality     THERAPY DIAG:  Difficulty in walking, not elsewhere classified  Muscle weakness (generalized)  Rationale for Evaluation and Treatment: Rehabilitation   SUBJECTIVE:                                                                                                                                                                                             SUBJECTIVE STATEMENT:  Pt reports he was forced to retired and once that happened in June of 2015, he noticed a decline in his health and strength.  Pt notes he joined the [employer] with his wife with [employer], but he hurt his back and then Covid caused everything to shut down.  Pt notes he would attempt to walk to his mailbox and it ended up getting harder and harder.  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 He is starting a new medication called Ilumya on 08/11/22 for his plaque psoriasis.    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    Pt accompanied by: self  PERTINENT HISTORY:   Pt is 82 y.o. male who has experienced a decline in his overall functional mobility and stability since Covid and being unable to go back to the gym.  Pt notes that he has noticed it being increasingly difficult performing tasks without losing his balance and just feels generalized weakness.  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    PAIN: Are you having pain? No  PRECAUTIONS: None  WEIGHT BEARING RESTRICTIONS: No  FALLS: Has patient fallen in last 6 months? No  LIVING ENVIRONMENT: Lives with: lives with their spouse Lives in: House/apartment Stairs: Yes: External: 4 steps; on right going up Has  following equipment at home: Single point cane, Walker - 2 wheeled, and Grab bars  PLOF:  Independent; pt does state he has difficulty tying shoes, but he has slip on shoes that assist with that.  PATIENT GOALS: Pt wants to be more mobile and be able to exercise.  OBJECTIVE:   DIAGNOSTIC FINDINGS:   EXAM 05/29/22: MRI HEAD WITHOUT CONTRAST  IMPRESSION: 1. No acute intracranial abnormality. 2. 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 3. Small chronic right cerebellar infarct.  COGNITION: Overall cognitive status: Within functional limits for tasks assessed   SENSATION: WFL  COORDINATION: WFL  POSTURE: rounded shoulders and forward head  LOWER EXTREMITY ROM:     Active  Right Eval Left Eval  Hip flexion    Hip extension    Hip abduction    Hip adduction    Hip internal rotation    Hip external rotation    Knee flexion    Knee extension    Ankle dorsiflexion    Ankle plantarflexion    Ankle inversion    Ankle eversion     (Blank rows = not tested)  LOWER EXTREMITY MMT:    MMT Right Eval Left Eval  Hip flexion 5 4+  Hip abduction 4+ 4+  Hip adduction 4+ 4+  Knee flexion 5 4+  Knee extension 5 4+  Ankle dorsiflexion 5 5  (Blank rows = not tested)   TRANSFERS: Assistive device utilized: Single point cane  Sit to stand: Modified independence Stand to sit: Modified independence Chair to chair: Modified independence   GAIT: Gait pattern: step through pattern, decreased arm swing- Right, decreased arm swing- Left, decreased stride length, and lateral lean- Left Distance walked: 26' Assistive device utilized: Single point cane Level of assistance: Modified independence with AD Comments: Pt able to ambulate fair with some abnormalities in his gait pattern.  FUNCTIONAL TESTS:  5 times sit to stand: 11.81 Timed up and go (TUG): 12.98 6 minute walk test: Not performed at evaluation 10 meter walk test: 0.84 m/s Berg Balance Scale: 47  PATIENT SURVEYS:  FOTO 58/58  TODAY'S TREATMENT: DATE: 08/03/22   Eval  Only  PATIENT EDUCATION: Education details: Pt educated on role of PT and services provided during current POC, along with prognosis and information about the clinic.  Person educated: Patient Education method: Explanation Education comprehension: verbalized understanding  HOME EXERCISE PROGRAM: Give at next visit.  GOALS: Goals reviewed with patient? Yes  SHORT TERM GOALS: Target date: 08/31/2022  Pt will be independent with HEP in order to demonstrate increased ability to perform tasks related to occupation/hobbies. Baseline: Pt to be given HEP at next visit. Goal status: INITIAL  LONG TERM GOALS: Target date: 10/26/2022  1.  Patient (> 44 years old) will complete five times sit to stand test in < 10 seconds indicating an increased LE strength and improved balance. Baseline: 11.81 sec Goal status: INITIAL  2.  Patient will increase FOTO score to equal to or greater than 64 to demonstrate statistically significant improvement in mobility and quality of life.  Baseline: FOTO - 58 Goal status: INITIAL   3.  Patient will increase Berg Balance score by > 6 points to demonstrate decreased fall risk during functional activities. Baseline: 47 Goal status: INITIAL   4.  Patient will reduce timed up and go to <11 seconds to reduce fall risk and demonstrate improved transfer/gait ability. Baseline: 12.98 sec Goal status: INITIAL  5.  Patient will increase 10 meter walk test  to >1.59ms as to improve gait speed for better community ambulation and to reduce fall risk. Baseline: 11.95 sec/0.84 m/s Goal status: INITIAL  6.  Patient will increase six minute walk test distance to >1000 for progression to community ambulator and improve gait ability Baseline: Perform at next session Goal status: INITIAL    ASSESSMENT:  CLINICAL IMPRESSION: Patient is a 82y.o. males who was seen today for physical therapy evaluation and treatment for imbalance and gait abnormality.  Pt presents with  physical impairments of decreased activity tolerance, decreased balance when ambulating and performing daily tasks, and decreased strength in B LE as noted.  Pt will benefit from skilled therapy to address tolerance, balance, and strength impairments necessary for improvement in quality of life.  Pt demonstrates understanding of this plan of care and agrees with this plan.   OBJECTIVE IMPAIRMENTS: Abnormal gait, decreased activity tolerance, decreased balance, decreased endurance, decreased mobility, difficulty walking, decreased strength, and decreased safety awareness.   ACTIVITY LIMITATIONS: carrying, lifting, bending, sitting, squatting, and locomotion level  PARTICIPATION LIMITATIONS: community activity and yard work  PERSONAL FACTORS: Age, Fitness, Past/current experiences, Time since onset of injury/illness/exacerbation, and 3+ comorbidities: DM2, Psoriasis, Prostate Cancer, GI complications, SOB on exertion  are also affecting patient's functional outcome.   REHAB POTENTIAL: Fair pt has significant influx of comorbidities that will likely impact their potential progress with PT.  CLINICAL DECISION MAKING: Stable/uncomplicated  EVALUATION COMPLEXITY: Moderate  PLAN:  PT FREQUENCY: 2x/week  PT DURATION: 12 weeks  PLANNED INTERVENTIONS: Therapeutic exercises, Therapeutic activity, Neuromuscular re-education, Balance training, Gait training, Patient/Family education, Self Care, Joint mobilization, Joint manipulation, Stair training, Vestibular training, Canalith repositioning, Dry Needling, Electrical stimulation, Spinal manipulation, Spinal mobilization, Cryotherapy, Moist heat, and Manual therapy  PLAN FOR NEXT SESSION:  Perform 6 min walk test and update goal as necessary.  Continue to improve LE strengthening and balance training.   JGwenlyn Saran PT, DPT Physical Therapist- [facility] 08/03/22, 5:05 PM

## 2022-08-04 NOTE — Therapy (Signed)
OUTPATIENT PHYSICAL THERAPY NEURO TREATMENT   Patient Name: Brian Callahan MRN: [id number] DOB:Sep 11, 1940, 82 y.o., male Today's Date: 08/04/2022   PCP: [name], MD REFERRING PROVIDER: [name], MD  END OF SESSION:    Past Medical History:  Diagnosis Date   Anemia    pt denies 01/18/20   BP (high blood pressure) 05/23/2015   BPH with obstruction/lower urinary tract symptoms    COPD (chronic obstructive pulmonary disease) ([place])    Diabetes mellitus without complication ([place])    diet controlled   Dyspnea    Encounter for long-term (current) use of other high-risk medications    GERD (gastroesophageal reflux disease)    gastritis   GI bleed    Gout    H/O hernia repair mid-90s   x2   History of elevated PSA    History of Rocky Mountain spotted fever    HLD (hyperlipidemia)    HTN (hypertension)    Hyperglycemia    Hyperuricemia    Incomplete bladder emptying    Macular degeneration    Obesity    Psoriasis    Psoriasis    Urinary frequency    Past Surgical History:  Procedure Laterality Date   APPENDECTOMY     BACK SURGERY     neck and spine 2007   CERVICAL SPINE SURGERY  2007   CHOLECYSTECTOMY N/A 10/18/2017   Procedure: LAPAROSCOPIC CHOLECYSTECTOMY;  Surgeon: [name], MD;  Location: [facility] ORS;  Service: General;  Laterality: N/A;   COLECTOMY  1998   partial, due to obstruction   COLONOSCOPY     lost 10 units of blood   COLONOSCOPY WITH PROPOFOL N/A 09/27/2019   Procedure: COLONOSCOPY WITH PROPOFOL;  Surgeon: [name], [name], MD;  Location: [facility] ENDOSCOPY;  Service: Gastroenterology;  Laterality: N/A;   CYSTOSCOPY WITH INSERTION OF UROLIFT N/A 01/23/2020   Procedure: CYSTOSCOPY WITH INSERTION OF UROLIFT;  Surgeon: [name], MD;  Location: [facility] ORS;  Service: Urology;  Laterality: N/A;   ESOPHAGOGASTRODUODENOSCOPY N/A 10/14/2016   Procedure: ESOPHAGOGASTRODUODENOSCOPY (EGD);  Surgeon: [name], MD;  Location: [facility]  ENDOSCOPY;  Service: Endoscopy;  Laterality: N/A;   ESOPHAGOGASTRODUODENOSCOPY (EGD) WITH PROPOFOL N/A 09/27/2019   Procedure: ESOPHAGOGASTRODUODENOSCOPY (EGD) WITH PROPOFOL;  Surgeon: [name], [name], MD;  Location: [facility] ENDOSCOPY;  Service: Gastroenterology;  Laterality: N/A;   EYE SURGERY     bilateral cataracts   HERNIA REPAIR  mid-90s   x2  inguinal   PROSTATE BIOPSY  2013   SURGERY SCROTAL / TESTICULAR     for fibrous growth   Patient Active Problem List   Diagnosis Date Noted   Diabetes mellitus type 2 with complications ([place]) 78/46/9629   Chest pain with moderate risk for cardiac etiology 11/29/2017   Positive cardiac stress test 11/29/2017   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 11/08/2017   Esophagitis, reflux 11/08/2017   SOB (shortness of breath) on exertion 07/02/2017   BPH with obstruction/lower urinary tract symptoms 11/21/2015   History of elevated PSA 11/21/2015   H/O gastric ulcer 09/05/2015   Disorder of eyeball 05/23/2015   H/O infectious disease 05/23/2015   Blood glucose elevated 05/23/2015   BP (high blood pressure) 05/23/2015   HLD (hyperlipidemia) 05/23/2015   Psoriasis 05/23/2015   Absolute anemia 01/02/2014   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   Elevated prostate specific antigen (PSA) 01/02/2014   Elevated blood uric acid level 01/02/2014   Enlarged prostate without lower urinary tract symptoms (luts) 01/02/2014   Bleeding gastrointestinal 04/15/2012  ONSET DATE: ~10 years  REFERRING DIAG:  R26.89 (ICD-10-CM) - Balance problem  R26.9 (ICD-10-CM) - Gait abnormality    THERAPY DIAG:  No diagnosis found.  Rationale for Evaluation and Treatment: Rehabilitation   SUBJECTIVE:                                                                                                                                                                                             SUBJECTIVE STATEMENT:  ***   Pt accompanied by: self  PERTINENT HISTORY:    Pt is 82 y.o. male who has experienced a decline in his overall functional mobility and stability since Covid and being unable to go back to the gym.  Pt notes that he has noticed it being increasingly difficult performing tasks without losing his balance and just feels generalized weakness.  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    PAIN: Are you having pain? No  PRECAUTIONS: None  WEIGHT BEARING RESTRICTIONS: No  FALLS: Has patient fallen in last 6 months? No  LIVING ENVIRONMENT: Lives with: lives with their spouse Lives in: House/apartment Stairs: Yes: External: 4 steps; on right going up Has following equipment at home: Single point cane, Walker - 2 wheeled, and Grab bars  PLOF: Independent; pt does state he has difficulty tying shoes, but he has slip on shoes that assist with that.  PATIENT GOALS: Pt wants to be more mobile and be able to exercise.  OBJECTIVE:   DIAGNOSTIC FINDINGS:   EXAM 05/29/22: MRI HEAD WITHOUT CONTRAST  IMPRESSION: 1. No acute intracranial abnormality. 2. 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 3. Small chronic right cerebellar infarct.  COGNITION: Overall cognitive status: Within functional limits for tasks assessed   SENSATION: WFL  COORDINATION: WFL  POSTURE: rounded shoulders and forward head  LOWER EXTREMITY ROM:     Active  Right Eval Left Eval  Hip flexion    Hip extension    Hip abduction    Hip adduction    Hip internal rotation    Hip external rotation    Knee flexion    Knee extension    Ankle dorsiflexion    Ankle plantarflexion    Ankle inversion    Ankle eversion     (Blank rows = not tested)  LOWER EXTREMITY MMT:    MMT Right Eval Left Eval  Hip flexion 5 4+  Hip abduction 4+ 4+  Hip adduction 4+ 4+  Knee flexion  5 4+  Knee  extension 5 4+  Ankle dorsiflexion 5 5  (Blank rows = not tested)   TRANSFERS: Assistive device utilized: Single point cane  Sit to stand: Modified independence Stand to sit: Modified independence Chair to chair: Modified independence   GAIT: Gait pattern: step through pattern, decreased arm swing- Right, decreased arm swing- Left, decreased stride length, and lateral lean- Left Distance walked: 79' Assistive device utilized: Single point cane Level of assistance: Modified independence with AD Comments: Pt able to ambulate fair with some abnormalities in his gait pattern.  FUNCTIONAL TESTS:  5 times sit to stand: 11.81 Timed up and go (TUG): 12.98 6 minute walk test: Not performed at evaluation 10 meter walk test: 0.84 m/s Berg Balance Scale: 47  PATIENT SURVEYS:  FOTO 58/58  TODAY'S TREATMENT: DATE: 08/04/22   ***  PATIENT EDUCATION: Education details: Pt educated on role of PT and services provided during current POC, along with prognosis and information about the clinic.  Person educated: Patient Education method: Explanation Education comprehension: verbalized understanding  HOME EXERCISE PROGRAM: Give at next visit.  GOALS: Goals reviewed with patient? Yes  SHORT TERM GOALS: Target date: 08/31/2022  Pt will be independent with HEP in order to demonstrate increased ability to perform tasks related to occupation/hobbies. Baseline: Pt to be given HEP at next visit. Goal status: INITIAL  LONG TERM GOALS: Target date: 10/26/2022  1.  Patient (> 64 years old) will complete five times sit to stand test in < 10 seconds indicating an increased LE strength and improved balance. Baseline: 11.81 sec Goal status: INITIAL  2.  Patient will increase FOTO score to equal to or greater than 64 to demonstrate statistically significant improvement in mobility and quality of life.  Baseline: FOTO - 58 Goal status: INITIAL   3.  Patient will increase Berg Balance score by >  6 points to demonstrate decreased fall risk during functional activities. Baseline: 47 Goal status: INITIAL   4.  Patient will reduce timed up and go to <11 seconds to reduce fall risk and demonstrate improved transfer/gait ability. Baseline: 12.98 sec Goal status: INITIAL  5.  Patient will increase 10 meter walk test to >1.3ms as to improve gait speed for better community ambulation and to reduce fall risk. Baseline: 11.95 sec/0.84 m/s Goal status: INITIAL  6.  Patient will increase six minute walk test distance to >1000 for progression to community ambulator and improve gait ability Baseline: Perform at next session Goal status: INITIAL    ASSESSMENT:  CLINICAL IMPRESSION:  *** Patient is a 82y.o. males who was seen today for physical therapy evaluation and treatment for imbalance and gait abnormality.  Pt presents with physical impairments of decreased activity tolerance, decreased balance when ambulating and performing daily tasks, and decreased strength in B LE as noted.  Pt will benefit from skilled therapy to address tolerance, balance, and strength impairments necessary for improvement in quality of life.  Pt demonstrates understanding of this plan of care and agrees with this plan.   OBJECTIVE IMPAIRMENTS: Abnormal gait, decreased activity tolerance, decreased balance, decreased endurance, decreased mobility, difficulty walking, decreased strength, and decreased safety awareness.   ACTIVITY LIMITATIONS: carrying, lifting, bending, sitting, squatting, and locomotion level  PARTICIPATION LIMITATIONS: community activity and yard work  PERSONAL FACTORS: Age, Fitness, Past/current experiences, Time since onset of injury/illness/exacerbation, and 3+ comorbidities: DM2, Psoriasis, Prostate Cancer, GI complications, SOB on exertion  are also affecting patient's functional outcome.   REHAB POTENTIAL: Fair pt has  significant influx of comorbidities that will likely impact their  potential progress with PT.  CLINICAL DECISION MAKING: Stable/uncomplicated  EVALUATION COMPLEXITY: Moderate  PLAN:  PT FREQUENCY: 2x/week  PT DURATION: 12 weeks  PLANNED INTERVENTIONS: Therapeutic exercises, Therapeutic activity, Neuromuscular re-education, Balance training, Gait training, Patient/Family education, Self Care, Joint mobilization, Joint manipulation, Stair training, Vestibular training, Canalith repositioning, Dry Needling, Electrical stimulation, Spinal manipulation, Spinal mobilization, Cryotherapy, Moist heat, and Manual therapy  PLAN FOR NEXT SESSION:  Perform 6 min walk test and update goal as necessary.  Continue to improve LE strengthening and balance training.   Gwenlyn Saran, PT, DPT Physical Therapist- [facility]  08/04/22, 4:52 PM

## 2022-08-05 ENCOUNTER — Ambulatory Visit: Payer: Medicare Other

## 2022-08-05 DIAGNOSIS — R262 Difficulty in walking, not elsewhere classified: Secondary | ICD-10-CM | POA: Diagnosis not present

## 2022-08-05 DIAGNOSIS — M6281 Muscle weakness (generalized): Secondary | ICD-10-CM

## 2022-08-10 ENCOUNTER — Ambulatory Visit: Payer: Medicare Other

## 2022-08-10 DIAGNOSIS — M6281 Muscle weakness (generalized): Secondary | ICD-10-CM

## 2022-08-10 DIAGNOSIS — R262 Difficulty in walking, not elsewhere classified: Secondary | ICD-10-CM

## 2022-08-10 NOTE — Therapy (Signed)
OUTPATIENT PHYSICAL THERAPY NEURO TREATMENT   Patient Name: Brian Callahan MRN: [id number] DOB:11-Feb-1941, 82 y.o., male Today's Date: 08/10/2022   PCP: [name], MD REFERRING PROVIDER: [name], MD  END OF SESSION:  PT End of Session - 08/10/22 1104     Visit Number 3    Number of Visits 24    Date for PT Re-Evaluation 10/26/22    PT Start Time 1103    PT Stop Time 0037    PT Time Calculation (min) 42 min    Equipment Utilized During Treatment Gait belt    Activity Tolerance Patient tolerated treatment well    Behavior During Therapy WFL for tasks assessed/performed               Past Medical History:  Diagnosis Date   Anemia    pt denies 01/18/20   BP (high blood pressure) 05/23/2015   BPH with obstruction/lower urinary tract symptoms    COPD (chronic obstructive pulmonary disease) ([place])    Diabetes mellitus without complication ([place])    diet controlled   Dyspnea    Encounter for long-term (current) use of other high-risk medications    GERD (gastroesophageal reflux disease)    gastritis   GI bleed    Gout    H/O hernia repair mid-90s   x2   History of elevated PSA    History of Rocky Mountain spotted fever    HLD (hyperlipidemia)    HTN (hypertension)    Hyperglycemia    Hyperuricemia    Incomplete bladder emptying    Macular degeneration    Obesity    Psoriasis    Psoriasis    Urinary frequency    Past Surgical History:  Procedure Laterality Date   APPENDECTOMY     BACK SURGERY     neck and spine 2007   CERVICAL SPINE SURGERY  2007   CHOLECYSTECTOMY N/A 10/18/2017   Procedure: LAPAROSCOPIC CHOLECYSTECTOMY;  Surgeon: [name], MD;  Location: [facility] ORS;  Service: General;  Laterality: N/A;   COLECTOMY  1998   partial, due to obstruction   COLONOSCOPY     lost 10 units of blood   COLONOSCOPY WITH PROPOFOL N/A 09/27/2019   Procedure: COLONOSCOPY WITH PROPOFOL;  Surgeon: [name], [name], MD;  Location: [facility] ENDOSCOPY;   Service: Gastroenterology;  Laterality: N/A;   CYSTOSCOPY WITH INSERTION OF UROLIFT N/A 01/23/2020   Procedure: CYSTOSCOPY WITH INSERTION OF UROLIFT;  Surgeon: [name], MD;  Location: [facility] ORS;  Service: Urology;  Laterality: N/A;   ESOPHAGOGASTRODUODENOSCOPY N/A 10/14/2016   Procedure: ESOPHAGOGASTRODUODENOSCOPY (EGD);  Surgeon: [name], MD;  Location: [facility] ENDOSCOPY;  Service: Endoscopy;  Laterality: N/A;   ESOPHAGOGASTRODUODENOSCOPY (EGD) WITH PROPOFOL N/A 09/27/2019   Procedure: ESOPHAGOGASTRODUODENOSCOPY (EGD) WITH PROPOFOL;  Surgeon: [name], [name], MD;  Location: [facility] ENDOSCOPY;  Service: Gastroenterology;  Laterality: N/A;   EYE SURGERY     bilateral cataracts   HERNIA REPAIR  mid-90s   x2  inguinal   PROSTATE BIOPSY  2013   SURGERY SCROTAL / TESTICULAR     for fibrous growth   Patient Active Problem List   Diagnosis Date Noted   Diabetes mellitus type 2 with complications ([place]) 04/88/8916   Chest pain with moderate risk for cardiac etiology 11/29/2017   Positive cardiac stress test 11/29/2017   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 11/08/2017   Esophagitis, reflux 11/08/2017   SOB (shortness of breath) on exertion 07/02/2017   BPH with obstruction/lower urinary tract symptoms 11/21/2015  History of elevated PSA 11/21/2015   H/O gastric ulcer 09/05/2015   Disorder of eyeball 05/23/2015   H/O infectious disease 05/23/2015   Blood glucose elevated 05/23/2015   BP (high blood pressure) 05/23/2015   HLD (hyperlipidemia) 05/23/2015   Psoriasis 05/23/2015   Absolute anemia 01/02/2014   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   Elevated prostate specific antigen (PSA) 01/02/2014   Elevated blood uric acid level 01/02/2014   Enlarged prostate without lower urinary tract symptoms (luts) 01/02/2014   Bleeding gastrointestinal 04/15/2012    ONSET DATE: ~10 years  REFERRING DIAG:  R26.89 (ICD-10-CM) - Balance problem  R26.9 (ICD-10-CM) - Gait abnormality     THERAPY DIAG:  Difficulty in walking, not elsewhere classified  Muscle weakness (generalized)  Rationale for Evaluation and Treatment: Rehabilitation   SUBJECTIVE:                                                                                                                                                                                             SUBJECTIVE STATEMENT:  Pt reports he didn't sleep good, waking up at 4AM.  Pt notes that he hasn't slept good in years.  Pt does report he ended up getting a ball to perform the exercises at home, but doesn't think that it's going to work because it's slick and he has a hard time getting it to stay between his knees.     Pt accompanied by: self  PERTINENT HISTORY:   Pt is 82 y.o. male who has experienced a decline in his overall functional mobility and stability since Covid and being unable to go back to the gym.  Pt notes that he has noticed it being increasingly difficult performing tasks without losing his balance and just feels generalized weakness.  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    PAIN: Are you having pain? No  PRECAUTIONS: None  WEIGHT BEARING RESTRICTIONS: No  FALLS: Has patient fallen in last 6 months? No  LIVING ENVIRONMENT: Lives with: lives with their spouse Lives in: House/apartment Stairs: Yes: External: 4 steps; on right going up Has following equipment at home: Single point cane, Walker - 2 wheeled, and Grab bars  PLOF: Independent; pt does state he has difficulty tying shoes, but he has slip on shoes that assist with that.  PATIENT GOALS: Pt wants to be more mobile and be able to exercise.  OBJECTIVE:   DIAGNOSTIC FINDINGS:   EXAM 05/29/22: MRI HEAD WITHOUT CONTRAST  IMPRESSION: 1. No acute intracranial abnormality. 2. 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 3. Small chronic right cerebellar infarct.  COGNITION: Overall cognitive status: Within functional limits for tasks assessed   SENSATION: WFL  COORDINATION: WFL  POSTURE: rounded shoulders and forward head   LOWER EXTREMITY MMT:    MMT Right Eval Left Eval  Hip flexion 5 4+  Hip abduction 4+ 4+  Hip adduction 4+ 4+  Knee flexion 5 4+  Knee extension 5 4+  Ankle dorsiflexion 5 5  (Blank rows = not tested)   TRANSFERS: Assistive device utilized: Single point cane  Sit to stand: Modified independence Stand to sit: Modified independence Chair to chair: Modified independence   GAIT: Gait pattern: step through pattern, decreased arm swing- Right, decreased arm swing- Left, decreased stride length, and lateral lean- Left Distance walked: 44' Assistive device utilized: Single point cane Level of assistance: Modified independence with AD Comments: Pt able to ambulate fair with some abnormalities in his gait pattern.  FUNCTIONAL TESTS:  5 times sit to stand: 11.81 Timed up and go (TUG): 12.98 6 minute walk test: Not performed at evaluation 10 meter walk test: 0.84 m/s Berg Balance Scale: 47  PATIENT SURVEYS:  FOTO 58/58  TODAY'S TREATMENT: DATE: 08/10/22   TherEx:  Seated hamstring curls with BTB, 2x10 cues for slowed eccentric return Seated hip adduction into rainbow physioball, 3 sec holds, 2x15 Seated LAQ with physioball between knees for hip adduction, 2x10 Seated LAQ seated edge of seat, 5# AW donned, and slowed eccentric lowering, 2x10 each LE Standing hip abduction, 5# AW donned, 2x15 each LE Step up onto 6" step, 5# AW donned, x15 each LE leading  Standing marches with 5# AW donned, 2x15 each LE Standing hamstring curls with 5# AW donned, 2x15 each LE Standing hip extension with 5# AW donned, 2x15 each LE STS while holding 10# medicine ball, x15 Deadlift with 15# KB on 6" elevated surface, x15 with verbal  cues for proper technique.      PATIENT EDUCATION: Education details: Pt educated on role of PT and services provided during current POC, along with prognosis and information about the clinic.  Person educated: Patient Education method: Explanation Education comprehension: verbalized understanding  HOME EXERCISE PROGRAM: Give at next visit.  GOALS: Goals reviewed with patient? Yes  SHORT TERM GOALS: Target date: 08/31/2022  Pt will be independent with HEP in order to demonstrate increased ability to perform tasks related to occupation/hobbies. Baseline: Pt to be given HEP at next visit. Goal status: INITIAL  LONG TERM GOALS: Target date: 10/26/2022  1.  Patient (> 40 years old) will complete five times sit to stand test in < 10 seconds indicating an increased LE strength and improved balance. Baseline: 11.81 sec Goal status: INITIAL  2.  Patient will increase FOTO score to equal to or greater than 64 to demonstrate statistically significant improvement in mobility and quality of life.  Baseline: FOTO - 58 Goal status: INITIAL   3.  Patient will increase Berg Balance score by > 6 points to demonstrate decreased fall risk during functional activities. Baseline: 47 Goal status: INITIAL   4.  Patient will reduce timed up and go to <11 seconds to reduce fall risk and demonstrate improved transfer/gait ability. Baseline: 12.98 sec Goal status: INITIAL  5.  Patient will increase 10 meter walk test to >1.5ms as to improve gait speed for better community ambulation and to reduce fall risk. Baseline: 11.95 sec/0.84 m/s Goal status:  INITIAL  6.  Patient will increase six minute walk test distance to >1000 for progression to community ambulator and improve gait ability Baseline: 875 ft with SPC Goal status: INITIAL    ASSESSMENT:  CLINICAL IMPRESSION:  Pt able to tolerate increased resistance with exercises today and put forth great effort throughout the session.  Pt noted  at the conclusion of the session that he was fatigued and had a good workout.  Pt is making good improvements and is only limited by joint restrictions at this time.   Pt will continue to benefit from skilled therapy to address remaining deficits in order to improve overall QoL and return to PLOF.        OBJECTIVE IMPAIRMENTS: Abnormal gait, decreased activity tolerance, decreased balance, decreased endurance, decreased mobility, difficulty walking, decreased strength, and decreased safety awareness.   ACTIVITY LIMITATIONS: carrying, lifting, bending, sitting, squatting, and locomotion level  PARTICIPATION LIMITATIONS: community activity and yard work  PERSONAL FACTORS: Age, Fitness, Past/current experiences, Time since onset of injury/illness/exacerbation, and 3+ comorbidities: DM2, Psoriasis, Prostate Cancer, GI complications, SOB on exertion  are also affecting patient's functional outcome.   REHAB POTENTIAL: Fair pt has significant influx of comorbidities that will likely impact their potential progress with PT.  CLINICAL DECISION MAKING: Stable/uncomplicated  EVALUATION COMPLEXITY: Moderate  PLAN:  PT FREQUENCY: 2x/week  PT DURATION: 12 weeks  PLANNED INTERVENTIONS: Therapeutic exercises, Therapeutic activity, Neuromuscular re-education, Balance training, Gait training, Patient/Family education, Self Care, Joint mobilization, Joint manipulation, Stair training, Vestibular training, Canalith repositioning, Dry Needling, Electrical stimulation, Spinal manipulation, Spinal mobilization, Cryotherapy, Moist heat, and Manual therapy  PLAN FOR NEXT SESSION:   Continue to improve LE strengthening and balance training.   Gwenlyn Saran, PT, DPT Physical Therapist- [facility]  08/10/22, 1:01 PM

## 2022-08-13 ENCOUNTER — Ambulatory Visit: Payer: Medicare Other

## 2022-08-13 DIAGNOSIS — M6281 Muscle weakness (generalized): Secondary | ICD-10-CM

## 2022-08-13 DIAGNOSIS — R262 Difficulty in walking, not elsewhere classified: Secondary | ICD-10-CM | POA: Diagnosis not present

## 2022-08-13 NOTE — Therapy (Signed)
OUTPATIENT PHYSICAL THERAPY NEURO TREATMENT   Patient Name: Brian Callahan MRN: [id number] DOB:1941/07/13, 82 y.o., male Today's Date: 08/13/2022   PCP: [name], MD REFERRING PROVIDER: [name], MD  END OF SESSION:  PT End of Session - 08/13/22 1149     Visit Number 4    Number of Visits 24    Date for PT Re-Evaluation 10/26/22    PT Start Time 1148    PT Stop Time 65    PT Time Calculation (min) 42 min    Equipment Utilized During Treatment Gait belt    Activity Tolerance Patient tolerated treatment well    Behavior During Therapy WFL for tasks assessed/performed                Past Medical History:  Diagnosis Date   Anemia    pt denies 01/18/20   BP (high blood pressure) 05/23/2015   BPH with obstruction/lower urinary tract symptoms    COPD (chronic obstructive pulmonary disease) ([place])    Diabetes mellitus without complication ([place])    diet controlled   Dyspnea    Encounter for long-term (current) use of other high-risk medications    GERD (gastroesophageal reflux disease)    gastritis   GI bleed    Gout    H/O hernia repair mid-90s   x2   History of elevated PSA    History of Rocky Mountain spotted fever    HLD (hyperlipidemia)    HTN (hypertension)    Hyperglycemia    Hyperuricemia    Incomplete bladder emptying    Macular degeneration    Obesity    Psoriasis    Psoriasis    Urinary frequency    Past Surgical History:  Procedure Laterality Date   APPENDECTOMY     BACK SURGERY     neck and spine 2007   CERVICAL SPINE SURGERY  2007   CHOLECYSTECTOMY N/A 10/18/2017   Procedure: LAPAROSCOPIC CHOLECYSTECTOMY;  Surgeon: [name], MD;  Location: [facility] ORS;  Service: General;  Laterality: N/A;   COLECTOMY  1998   partial, due to obstruction   COLONOSCOPY     lost 10 units of blood   COLONOSCOPY WITH PROPOFOL N/A 09/27/2019   Procedure: COLONOSCOPY WITH PROPOFOL;  Surgeon: [name], [name], MD;  Location: [facility] ENDOSCOPY;   Service: Gastroenterology;  Laterality: N/A;   CYSTOSCOPY WITH INSERTION OF UROLIFT N/A 01/23/2020   Procedure: CYSTOSCOPY WITH INSERTION OF UROLIFT;  Surgeon: [name], MD;  Location: [facility] ORS;  Service: Urology;  Laterality: N/A;   ESOPHAGOGASTRODUODENOSCOPY N/A 10/14/2016   Procedure: ESOPHAGOGASTRODUODENOSCOPY (EGD);  Surgeon: [name], MD;  Location: [facility] ENDOSCOPY;  Service: Endoscopy;  Laterality: N/A;   ESOPHAGOGASTRODUODENOSCOPY (EGD) WITH PROPOFOL N/A 09/27/2019   Procedure: ESOPHAGOGASTRODUODENOSCOPY (EGD) WITH PROPOFOL;  Surgeon: [name], [name], MD;  Location: [facility] ENDOSCOPY;  Service: Gastroenterology;  Laterality: N/A;   EYE SURGERY     bilateral cataracts   HERNIA REPAIR  mid-90s   x2  inguinal   PROSTATE BIOPSY  2013   SURGERY SCROTAL / TESTICULAR     for fibrous growth   Patient Active Problem List   Diagnosis Date Noted   Diabetes mellitus type 2 with complications ([place]) 57/32/2025   Chest pain with moderate risk for cardiac etiology 11/29/2017   Positive cardiac stress test 11/29/2017   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 11/08/2017   Esophagitis, reflux 11/08/2017   SOB (shortness of breath) on exertion 07/02/2017   BPH with obstruction/lower urinary tract symptoms 11/21/2015  History of elevated PSA 11/21/2015   H/O gastric ulcer 09/05/2015   Disorder of eyeball 05/23/2015   H/O infectious disease 05/23/2015   Blood glucose elevated 05/23/2015   BP (high blood pressure) 05/23/2015   HLD (hyperlipidemia) 05/23/2015   Psoriasis 05/23/2015   Absolute anemia 01/02/2014   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   Elevated prostate specific antigen (PSA) 01/02/2014   Elevated blood uric acid level 01/02/2014   Enlarged prostate without lower urinary tract symptoms (luts) 01/02/2014   Bleeding gastrointestinal 04/15/2012    ONSET DATE: ~10 years  REFERRING DIAG:  R26.89 (ICD-10-CM) - Balance problem  R26.9 (ICD-10-CM) - Gait abnormality     THERAPY DIAG:  Difficulty in walking, not elsewhere classified  Muscle weakness (generalized)  Rationale for Evaluation and Treatment: Rehabilitation   SUBJECTIVE:                                                                                                                                                                                             SUBJECTIVE STATEMENT:  Pt reports his granddaughter is cancer free and her PET scan results came back great.  Pt is also interested in getting membership to [employer] with [employer] and doing some of the classes.  Pt has had his ILUMYA shot and was told that he would be likely see results after the second shot.       Pt accompanied by: self  PERTINENT HISTORY:   Pt is 82 y.o. male who has experienced a decline in his overall functional mobility and stability since Covid and being unable to go back to the gym.  Pt notes that he has noticed it being increasingly difficult performing tasks without losing his balance and just feels generalized weakness.  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    PAIN: Are you having pain? No  PRECAUTIONS: None  WEIGHT BEARING RESTRICTIONS: No  FALLS: Has patient fallen in last 6 months? No  LIVING ENVIRONMENT: Lives with: lives with their spouse Lives in: House/apartment Stairs: Yes: External: 4 steps; on right going up Has following equipment at home: Single point cane, Walker - 2 wheeled, and Grab bars  PLOF: Independent; pt does state he has difficulty tying shoes, but he has slip on shoes that assist with that.  PATIENT GOALS: Pt wants to be more mobile and be able to exercise.  OBJECTIVE:   DIAGNOSTIC FINDINGS:   EXAM 05/29/22: MRI HEAD WITHOUT CONTRAST  IMPRESSION: 1.  No acute intracranial abnormality. 2. 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 3. Small chronic right cerebellar infarct.  COGNITION: Overall cognitive status: Within functional limits for tasks assessed   SENSATION: WFL  COORDINATION: WFL  POSTURE: rounded shoulders and forward head   LOWER EXTREMITY MMT:    MMT Right Eval Left Eval  Hip flexion 5 4+  Hip abduction 4+ 4+  Hip adduction 4+ 4+  Knee flexion 5 4+  Knee extension 5 4+  Ankle dorsiflexion 5 5  (Blank rows = not tested)   TRANSFERS: Assistive device utilized: Single point cane  Sit to stand: Modified independence Stand to sit: Modified independence Chair to chair: Modified independence   GAIT: Gait pattern: step through pattern, decreased arm swing- Right, decreased arm swing- Left, decreased stride length, and lateral lean- Left Distance walked: 25' Assistive device utilized: Single point cane Level of assistance: Modified independence with AD Comments: Pt able to ambulate fair with some abnormalities in his gait pattern.  FUNCTIONAL TESTS:  5 times sit to stand: 11.81 Timed up and go (TUG): 12.98 6 minute walk test: Not performed at evaluation 10 meter walk test: 0.84 m/s Berg Balance Scale: 47  PATIENT SURVEYS:  FOTO 58/58  TODAY'S TREATMENT: DATE: 08/13/22   TherEx:  Standing hip abduction, 5# AW donned, 2x15 each LE Standing marches with 5# AW donned, 2x15 each LE Standing hamstring curls with 5# AW donned, 2x15 each LE Standing hip extension with 5# AW donned, 2x15 each LE STS while holding 10# medicine ball, x15 Deadlift with 15# KB on 6" elevated surface, x15 with verbal cues for proper technique   Neuro:  Activity Description: Climbing up stairs to level of lit blaze pods and returning to home base at bottom of steps Activity Setting:  Home Base Number of Pods:  5 Cycles/Sets:  3 Duration (Time or Hit Count):  10   Patient Stats  Reaction Time:   3,[phone] 6,[phone]    PATIENT EDUCATION: Education  details: Pt educated on role of PT and services provided during current POC, along with prognosis and information about the clinic.  Person educated: Patient Education method: Explanation Education comprehension: verbalized understanding  HOME EXERCISE PROGRAM:  Access Code: [email or web address] URL: [email or web address] Date: 08/13/2022 Prepared by: Wacissa Nation  Exercises - Supine Bridge  - 1 x daily - 7 x weekly - 3 sets - 10 reps - Supine Straight Leg Raise  - 1 x daily - 7 x weekly - 3 sets - 10 reps - Sidelying Hip Abduction  - 1 x daily - 7 x weekly - 3 sets - 10 reps - Clamshell  - 1 x daily - 7 x weekly - 3 sets - 10 reps  GOALS: Goals reviewed with patient? Yes  SHORT TERM GOALS: Target date: 08/31/2022  Pt will be independent with HEP in order to demonstrate increased ability to perform tasks related to occupation/hobbies. Baseline: Pt given HEP 08/13/22 and noted above Goal status: INITIAL  LONG TERM GOALS: Target date: 10/26/2022  1.  Patient (> 94 years old) will complete five times sit to stand test in < 10 seconds indicating an increased LE strength and improved balance. Baseline: 11.81 sec Goal status: INITIAL  2.  Patient will increase FOTO score to equal to or greater than 64 to demonstrate statistically significant improvement in mobility and quality of life.  Baseline: FOTO - 58 Goal status: INITIAL   3.  Patient will increase Berg Balance score by > 6  points to demonstrate decreased fall risk during functional activities. Baseline: 47 Goal status: INITIAL   4.  Patient will reduce timed up and go to <11 seconds to reduce fall risk and demonstrate improved transfer/gait ability. Baseline: 12.98 sec Goal status: INITIAL  5.  Patient will increase 10 meter walk test to >1.50ms as to improve gait speed for better community ambulation and to reduce fall risk. Baseline: 11.95 sec/0.84 m/s Goal status: INITIAL  6.  Patient will increase six minute walk  test distance to >1000 for progression to community ambulator and improve gait ability Baseline: 875 ft with SPC Goal status: INITIAL    ASSESSMENT:  CLINICAL IMPRESSION:  Pt performed well with all the tasks and put forth great effort throughout the session.  Pt most notably has some discomfort and weakness in the L hip that is causing some complications during the stair exercise.  Pt still performed well and was given new HEP to target hip musculature in order to improve overall strength of the hip.  Pt agreeable to performing the exercises at home.   Pt will continue to benefit from skilled therapy to address remaining deficits in order to improve overall QoL and return to PLOF.        OBJECTIVE IMPAIRMENTS: Abnormal gait, decreased activity tolerance, decreased balance, decreased endurance, decreased mobility, difficulty walking, decreased strength, and decreased safety awareness.   ACTIVITY LIMITATIONS: carrying, lifting, bending, sitting, squatting, and locomotion level  PARTICIPATION LIMITATIONS: community activity and yard work  PERSONAL FACTORS: Age, Fitness, Past/current experiences, Time since onset of injury/illness/exacerbation, and 3+ comorbidities: DM2, Psoriasis, Prostate Cancer, GI complications, SOB on exertion  are also affecting patient's functional outcome.   REHAB POTENTIAL: Fair pt has significant influx of comorbidities that will likely impact their potential progress with PT.  CLINICAL DECISION MAKING: Stable/uncomplicated  EVALUATION COMPLEXITY: Moderate  PLAN:  PT FREQUENCY: 2x/week  PT DURATION: 12 weeks  PLANNED INTERVENTIONS: Therapeutic exercises, Therapeutic activity, Neuromuscular re-education, Balance training, Gait training, Patient/Family education, Self Care, Joint mobilization, Joint manipulation, Stair training, Vestibular training, Canalith repositioning, Dry Needling, Electrical stimulation, Spinal manipulation, Spinal mobilization,  Cryotherapy, Moist heat, and Manual therapy  PLAN FOR NEXT SESSION:   Continue to improve LE strengthening and balance training.   JGwenlyn Saran PT, DPT Physical Therapist- [facility] 08/13/22, 2:02 PM

## 2022-08-17 ENCOUNTER — Ambulatory Visit: Payer: Medicare Other

## 2022-08-17 DIAGNOSIS — R262 Difficulty in walking, not elsewhere classified: Secondary | ICD-10-CM

## 2022-08-17 DIAGNOSIS — M6281 Muscle weakness (generalized): Secondary | ICD-10-CM

## 2022-08-17 NOTE — Therapy (Signed)
OUTPATIENT PHYSICAL THERAPY NEURO TREATMENT   Patient Name: Brian Callahan MRN: [id number] DOB:08-Oct-1940, 82 y.o., male Today's Date: 08/17/2022   PCP: [name], MD REFERRING PROVIDER: [name], MD  END OF SESSION:  PT End of Session - 08/17/22 1017     Visit Number 5    Number of Visits 24    Date for PT Re-Evaluation 10/26/22    PT Start Time 1017    PT Stop Time 1100    PT Time Calculation (min) 43 min    Equipment Utilized During Treatment Gait belt    Activity Tolerance Patient tolerated treatment well    Behavior During Therapy WFL for tasks assessed/performed                 Past Medical History:  Diagnosis Date   Anemia    pt denies 01/18/20   BP (high blood pressure) 05/23/2015   BPH with obstruction/lower urinary tract symptoms    COPD (chronic obstructive pulmonary disease) ([place])    Diabetes mellitus without complication ([place])    diet controlled   Dyspnea    Encounter for long-term (current) use of other high-risk medications    GERD (gastroesophageal reflux disease)    gastritis   GI bleed    Gout    H/O hernia repair mid-90s   x2   History of elevated PSA    History of Rocky Mountain spotted fever    HLD (hyperlipidemia)    HTN (hypertension)    Hyperglycemia    Hyperuricemia    Incomplete bladder emptying    Macular degeneration    Obesity    Psoriasis    Psoriasis    Urinary frequency    Past Surgical History:  Procedure Laterality Date   APPENDECTOMY     BACK SURGERY     neck and spine 2007   CERVICAL SPINE SURGERY  2007   CHOLECYSTECTOMY N/A 10/18/2017   Procedure: LAPAROSCOPIC CHOLECYSTECTOMY;  Surgeon: [name], MD;  Location: [facility] ORS;  Service: General;  Laterality: N/A;   COLECTOMY  1998   partial, due to obstruction   COLONOSCOPY     lost 10 units of blood   COLONOSCOPY WITH PROPOFOL N/A 09/27/2019   Procedure: COLONOSCOPY WITH PROPOFOL;  Surgeon: [name], [name], MD;  Location: [facility] ENDOSCOPY;   Service: Gastroenterology;  Laterality: N/A;   CYSTOSCOPY WITH INSERTION OF UROLIFT N/A 01/23/2020   Procedure: CYSTOSCOPY WITH INSERTION OF UROLIFT;  Surgeon: [name], MD;  Location: [facility] ORS;  Service: Urology;  Laterality: N/A;   ESOPHAGOGASTRODUODENOSCOPY N/A 10/14/2016   Procedure: ESOPHAGOGASTRODUODENOSCOPY (EGD);  Surgeon: [name], MD;  Location: [facility] ENDOSCOPY;  Service: Endoscopy;  Laterality: N/A;   ESOPHAGOGASTRODUODENOSCOPY (EGD) WITH PROPOFOL N/A 09/27/2019   Procedure: ESOPHAGOGASTRODUODENOSCOPY (EGD) WITH PROPOFOL;  Surgeon: [name], [name], MD;  Location: [facility] ENDOSCOPY;  Service: Gastroenterology;  Laterality: N/A;   EYE SURGERY     bilateral cataracts   HERNIA REPAIR  mid-90s   x2  inguinal   PROSTATE BIOPSY  2013   SURGERY SCROTAL / TESTICULAR     for fibrous growth   Patient Active Problem List   Diagnosis Date Noted   Diabetes mellitus type 2 with complications ([place]) 40/81/4481   Chest pain with moderate risk for cardiac etiology 11/29/2017   Positive cardiac stress test 11/29/2017   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 11/08/2017   Esophagitis, reflux 11/08/2017   SOB (shortness of breath) on exertion 07/02/2017   BPH with obstruction/lower urinary tract symptoms  11/21/2015   History of elevated PSA 11/21/2015   H/O gastric ulcer 09/05/2015   Disorder of eyeball 05/23/2015   H/O infectious disease 05/23/2015   Blood glucose elevated 05/23/2015   BP (high blood pressure) 05/23/2015   HLD (hyperlipidemia) 05/23/2015   Psoriasis 05/23/2015   Absolute anemia 01/02/2014   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   Elevated prostate specific antigen (PSA) 01/02/2014   Elevated blood uric acid level 01/02/2014   Enlarged prostate without lower urinary tract symptoms (luts) 01/02/2014   Bleeding gastrointestinal 04/15/2012    ONSET DATE: ~10 years  REFERRING DIAG:  R26.89 (ICD-10-CM) - Balance problem  R26.9 (ICD-10-CM) - Gait abnormality     THERAPY DIAG:  Difficulty in walking, not elsewhere classified  Muscle weakness (generalized)  Rationale for Evaluation and Treatment: Rehabilitation   SUBJECTIVE:                                                                                                                                                                                             SUBJECTIVE STATEMENT:  Pt reports he didn't do much over the weekend as it was too cold to ride his motorcycle.  Pt otherwise is doing well and denies any falls.   Pt accompanied by: self  PERTINENT HISTORY:   Pt is 82 y.o. male who has experienced a decline in his overall functional mobility and stability since Covid and being unable to go back to the gym.  Pt notes that he has noticed it being increasingly difficult performing tasks without losing his balance and just feels generalized weakness.  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    PAIN: Are you having pain? No  PRECAUTIONS: None  WEIGHT BEARING RESTRICTIONS: No  FALLS: Has patient fallen in last 6 months? No  LIVING ENVIRONMENT: Lives with: lives with their spouse Lives in: House/apartment Stairs: Yes: External: 4 steps; on right going up Has following equipment at home: Single point cane, Walker - 2 wheeled, and Grab bars  PLOF: Independent; pt does state he has difficulty tying shoes, but he has slip on shoes that assist with that.  PATIENT GOALS: Pt wants to be more mobile and be able to exercise.  OBJECTIVE:   DIAGNOSTIC FINDINGS:   EXAM 05/29/22: MRI HEAD WITHOUT CONTRAST  IMPRESSION: 1. No acute intracranial abnormality. 2. 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 3. Small chronic right cerebellar infarct.  COGNITION: Overall cognitive status: Within functional limits for  tasks assessed   SENSATION: WFL  COORDINATION: WFL  POSTURE: rounded shoulders and forward head   LOWER EXTREMITY MMT:    MMT Right Eval Left Eval  Hip flexion 5 4+  Hip abduction 4+ 4+  Hip adduction 4+ 4+  Knee flexion 5 4+  Knee extension 5 4+  Ankle dorsiflexion 5 5  (Blank rows = not tested)   TRANSFERS: Assistive device utilized: Single point cane  Sit to stand: Modified independence Stand to sit: Modified independence Chair to chair: Modified independence   GAIT: Gait pattern: step through pattern, decreased arm swing- Right, decreased arm swing- Left, decreased stride length, and lateral lean- Left Distance walked: 83' Assistive device utilized: Single point cane Level of assistance: Modified independence with AD Comments: Pt able to ambulate fair with some abnormalities in his gait pattern.  FUNCTIONAL TESTS:  5 times sit to stand: 11.81 Timed up and go (TUG): 12.98 6 minute walk test: Not performed at evaluation 10 meter walk test: 0.84 m/s Berg Balance Scale: 47  PATIENT SURVEYS:  FOTO 58/58  TODAY'S TREATMENT: DATE: 08/17/22   TherEx:  STS while holding 15# KB, 2x10 Deadlift with 15# KB on 6" elevated surface, 2x10 with good carryover from last visit   Neuro:  Activity Description: Resisted walking forward with blazepods on one side (2x R, 2x L) and tapping with ipsilateral LE Activity Setting:  Random Number of Pods:  6 Cycles/Sets:  x2 each side Duration (Time or Hit Count):  10   Patient Stats  Reaction Time:   7,796 (R LE) 7,558 (R LE) 6,969 (L LE) 6,460 (L LE)   Activity Description: Lateral resisted walking with blazepods in front of pt Activity Setting:  Random Number of Pods:  6 Cycles/Sets:  x2 each side Duration (Time or Hit Count):  10   Patient Stats  Reaction Time:   9,397 (L LE leading) 10,179 (R LE leading) 6,874 (L LE leading) 7,902 (R LE leading)        PATIENT EDUCATION: Education details: Pt  educated on role of PT and services provided during current POC, along with prognosis and information about the clinic.  Person educated: Patient Education method: Explanation Education comprehension: verbalized understanding  HOME EXERCISE PROGRAM:  Access Code: [email or web address] URL: [email or web address] Date: 08/17/2022 Prepared by: [place] with Resistance  - 2 x daily - 7 x weekly - 2 sets - 10 reps - Sidelying Reverse Clamshell with Resistance  - 2 x daily - 7 x weekly - 2 sets - 10 reps - Prone Hip Extension with Resistance Loop  - 2 x daily - 7 x weekly - 2 sets - 10 reps - Supine Bridge with Resistance Band  - 2 x daily - 7 x weekly - 2 sets - 10 reps - Side Stepping with Resistance at Thighs  - 2 x daily - 7 x weekly - 2 sets - 10 reps  GOALS: Goals reviewed with patient? Yes  SHORT TERM GOALS: Target date: 08/31/2022  Pt will be independent with HEP in order to demonstrate increased ability to perform tasks related to occupation/hobbies. Baseline: Pt given HEP 08/13/22 and noted above Goal status: INITIAL  LONG TERM GOALS: Target date: 10/26/2022  1.  Patient (> 34 years old) will complete five times sit to stand test in < 10 seconds indicating an increased LE strength and improved balance. Baseline: 11.81 sec Goal status: INITIAL  2.  Patient will increase FOTO score to equal to or greater  than 64 to demonstrate statistically significant improvement in mobility and quality of life.  Baseline: FOTO - 58 Goal status: INITIAL   3.  Patient will increase Berg Balance score by > 6 points to demonstrate decreased fall risk during functional activities. Baseline: 47 Goal status: INITIAL   4.  Patient will reduce timed up and go to <11 seconds to reduce fall risk and demonstrate improved transfer/gait ability. Baseline: 12.98 sec Goal status: INITIAL  5.  Patient will increase 10 meter walk test to >1.91ms as to improve gait speed for  better community ambulation and to reduce fall risk. Baseline: 11.95 sec/0.84 m/s Goal status: INITIAL  6.  Patient will increase six minute walk test distance to >1000 for progression to community ambulator and improve gait ability Baseline: 875 ft with SPC Goal status: INITIAL    ASSESSMENT:  CLINICAL IMPRESSION:  Pt performed well with the exercises given.  Pt did have one instance of instability when performing resisted walking forward, noting a sharp pain in the L hip that almost caused pt to have uncontrolled descent to the floor.  Therapist provided minA to prevent descent and pt able to continue without any increase in pain during the tasks.  Pt does note significant reduction in overall strength of the L hip at this time.  Pt is hoping to request hip x-ray due to the pain in the L hip at times.   Pt will continue to benefit from skilled therapy to address remaining deficits in order to improve overall QoL and return to PLOF.         OBJECTIVE IMPAIRMENTS: Abnormal gait, decreased activity tolerance, decreased balance, decreased endurance, decreased mobility, difficulty walking, decreased strength, and decreased safety awareness.   ACTIVITY LIMITATIONS: carrying, lifting, bending, sitting, squatting, and locomotion level  PARTICIPATION LIMITATIONS: community activity and yard work  PERSONAL FACTORS: Age, Fitness, Past/current experiences, Time since onset of injury/illness/exacerbation, and 3+ comorbidities: DM2, Psoriasis, Prostate Cancer, GI complications, SOB on exertion  are also affecting patient's functional outcome.   REHAB POTENTIAL: Fair pt has significant influx of comorbidities that will likely impact their potential progress with PT.  CLINICAL DECISION MAKING: Stable/uncomplicated  EVALUATION COMPLEXITY: Moderate  PLAN:  PT FREQUENCY: 2x/week  PT DURATION: 12 weeks  PLANNED INTERVENTIONS: Therapeutic exercises, Therapeutic activity, Neuromuscular  re-education, Balance training, Gait training, Patient/Family education, Self Care, Joint mobilization, Joint manipulation, Stair training, Vestibular training, Canalith repositioning, Dry Needling, Electrical stimulation, Spinal manipulation, Spinal mobilization, Cryotherapy, Moist heat, and Manual therapy  PLAN FOR NEXT SESSION:   Continue to improve LE strengthening and balance training.   JGwenlyn Saran PT, DPT Physical Therapist- [facility] 08/17/22, 2:06 PM

## 2022-08-19 ENCOUNTER — Ambulatory Visit
Admission: RE | Admit: 2022-08-19 | Discharge: 2022-08-19 | Disposition: A | Discharge status: Home / Self Care | Payer: Medicare Other | Source: Ambulatory Visit | Attending: Urology | Admitting: Urology

## 2022-08-19 DIAGNOSIS — C61 Malignant neoplasm of prostate: Secondary | ICD-10-CM | POA: Diagnosis present

## 2022-08-19 MED ORDER — PIFLIFOLASTAT F 18 (PYLARIFY) INJECTION
9.0000 | Freq: Once | INTRAVENOUS | Status: AC
  Administered 2022-08-19: 9.8 via INTRAVENOUS

## 2022-08-20 ENCOUNTER — Ambulatory Visit: Payer: Medicare Other

## 2022-08-20 DIAGNOSIS — R262 Difficulty in walking, not elsewhere classified: Secondary | ICD-10-CM | POA: Diagnosis not present

## 2022-08-20 DIAGNOSIS — M6281 Muscle weakness (generalized): Secondary | ICD-10-CM

## 2022-08-20 NOTE — Therapy (Signed)
OUTPATIENT PHYSICAL THERAPY NEURO TREATMENT   Patient Name: Brian Callahan MRN: [id number] DOB:[birth year], 82 y.o., male Today's Date: 08/20/2022   PCP: Brian Aland, MD REFERRING PROVIDER: [name], MD  END OF SESSION:   PT End of Session - 08/20/22 1151     Visit Number 6    Number of Visits 24    Date for PT Re-Evaluation 10/26/22    PT Start Time 1149    PT Stop Time 1230    PT Time Calculation (min) 41 min    Equipment Utilized During Treatment Gait belt    Activity Tolerance Patient tolerated treatment well    Behavior During Therapy WFL for tasks assessed/performed               Past Medical History:  Diagnosis Date   Anemia    pt denies 01/18/20   BP (high blood pressure) 05/23/2015   BPH with obstruction/lower urinary tract symptoms    COPD (chronic obstructive pulmonary disease) ([place])    Diabetes mellitus without complication ([place])    diet controlled   Dyspnea    Encounter for long-term (current) use of other high-risk medications    GERD (gastroesophageal reflux disease)    gastritis   GI bleed    Gout    H/O hernia repair mid-90s   x2   History of elevated PSA    History of Rocky Mountain spotted fever    HLD (hyperlipidemia)    HTN (hypertension)    Hyperglycemia    Hyperuricemia    Incomplete bladder emptying    Macular degeneration    Obesity    Psoriasis    Psoriasis    Urinary frequency    Past Surgical History:  Procedure Laterality Date   APPENDECTOMY     BACK SURGERY     neck and spine 2007   CERVICAL SPINE SURGERY  2007   CHOLECYSTECTOMY N/A 10/18/2017   Procedure: LAPAROSCOPIC CHOLECYSTECTOMY;  Surgeon: Brian Pun, MD;  Location: [facility] ORS;  Service: General;  Laterality: N/A;   COLECTOMY  1998   partial, due to obstruction   COLONOSCOPY     lost 10 units of blood   COLONOSCOPY WITH PROPOFOL N/A 09/27/2019   Procedure: COLONOSCOPY WITH PROPOFOL;  Surgeon: [name], [name], MD;  Location: [facility] ENDOSCOPY;   Service: Gastroenterology;  Laterality: N/A;   CYSTOSCOPY WITH INSERTION OF UROLIFT N/A 01/23/2020   Procedure: CYSTOSCOPY WITH INSERTION OF UROLIFT;  Surgeon: Brian Sons, MD;  Location: [facility] ORS;  Service: Urology;  Laterality: N/A;   ESOPHAGOGASTRODUODENOSCOPY N/A 10/14/2016   Procedure: ESOPHAGOGASTRODUODENOSCOPY (EGD);  Surgeon: Brian Silvas, MD;  Location: [facility] ENDOSCOPY;  Service: Endoscopy;  Laterality: N/A;   ESOPHAGOGASTRODUODENOSCOPY (EGD) WITH PROPOFOL N/A 09/27/2019   Procedure: ESOPHAGOGASTRODUODENOSCOPY (EGD) WITH PROPOFOL;  Surgeon: [name], [name], MD;  Location: [facility] ENDOSCOPY;  Service: Gastroenterology;  Laterality: N/A;   EYE SURGERY     bilateral cataracts   HERNIA REPAIR  mid-90s   x2  inguinal   PROSTATE BIOPSY  2013   SURGERY SCROTAL / TESTICULAR     for fibrous growth   Patient Active Problem List   Diagnosis Date Noted   Diabetes mellitus type 2 with complications ([place]) 12/45/8099   Chest pain with moderate risk for cardiac etiology 11/29/2017   Positive cardiac stress test 11/29/2017   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 11/08/2017   Esophagitis, reflux 11/08/2017   SOB (shortness of breath) on exertion 07/02/2017   BPH with obstruction/lower urinary tract symptoms 11/21/2015  History of elevated PSA 11/21/2015   H/O gastric ulcer 09/05/2015   Disorder of eyeball 05/23/2015   H/O infectious disease 05/23/2015   Blood glucose elevated 05/23/2015   BP (high blood pressure) 05/23/2015   HLD (hyperlipidemia) 05/23/2015   Psoriasis 05/23/2015   Absolute anemia 01/02/2014   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   Elevated prostate specific antigen (PSA) 01/02/2014   Elevated blood uric acid level 01/02/2014   Enlarged prostate without lower urinary tract symptoms (luts) 01/02/2014   Bleeding gastrointestinal 04/15/2012    ONSET DATE: ~10 years  REFERRING DIAG:  R26.89 (ICD-10-CM) - Balance problem  R26.9 (ICD-10-CM) - Gait abnormality     THERAPY DIAG:  Difficulty in walking, not elsewhere classified  Muscle weakness (generalized)  Rationale for Evaluation and Treatment: Rehabilitation   SUBJECTIVE:                                                                                                                                                                                             SUBJECTIVE STATEMENT:  Pt reports he is doing well other than his legs/feet swelling.  Pt states he plans on watching the football games this weekend but other than that, not much of anything.   Pt accompanied by: self  PERTINENT HISTORY:   Pt is 82 y.o. male who has experienced a decline in his overall functional mobility and stability since Covid and being unable to go back to the gym.  Pt notes that he has noticed it being increasingly difficult performing tasks without losing his balance and just feels generalized weakness.  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    PAIN: Are you having pain? No  PRECAUTIONS: None  WEIGHT BEARING RESTRICTIONS: No  FALLS: Has patient fallen in last 6 months? No  LIVING ENVIRONMENT: Lives with: lives with their spouse Lives in: House/apartment Stairs: Yes: External: 4 steps; on right going up Has following equipment at home: Single point cane, Walker - 2 wheeled, and Grab bars  PLOF: Independent; pt does state he has difficulty tying shoes, but he has slip on shoes that assist with that.  PATIENT GOALS: Pt wants to be more mobile and be able to exercise.  OBJECTIVE:   DIAGNOSTIC FINDINGS:   EXAM 05/29/22: MRI HEAD WITHOUT CONTRAST  IMPRESSION: 1. No acute intracranial abnormality. 2. 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 3. Small chronic right cerebellar infarct.  COGNITION: Overall cognitive status:  Within functional limits  for tasks assessed   SENSATION: WFL  COORDINATION: WFL  POSTURE: rounded shoulders and forward head   LOWER EXTREMITY MMT:    MMT Right Eval Left Eval  Hip flexion 5 4+  Hip abduction 4+ 4+  Hip adduction 4+ 4+  Knee flexion 5 4+  Knee extension 5 4+  Ankle dorsiflexion 5 5  (Blank rows = not tested)   TRANSFERS: Assistive device utilized: Single point cane  Sit to stand: Modified independence Stand to sit: Modified independence Chair to chair: Modified independence   GAIT: Gait pattern: step through pattern, decreased arm swing- Right, decreased arm swing- Left, decreased stride length, and lateral lean- Left Distance walked: 26' Assistive device utilized: Single point cane Level of assistance: Modified independence with AD Comments: Pt able to ambulate fair with some abnormalities in his gait pattern.  FUNCTIONAL TESTS:  5 times sit to stand: 11.81 Timed up and go (TUG): 12.98 6 minute walk test: Not performed at evaluation 10 meter walk test: 0.84 m/s Berg Balance Scale: 47  PATIENT SURVEYS:  FOTO 58/58  TODAY'S TREATMENT: DATE: 08/20/22   TherEx:  STS while holding 15# KB, 2x10 Deadlift with 15# KB on 6" elevated surface, 2x10 with good carryover from last visit Seated resisted marching with 7.5# AW on thigh, 2x15   Neuro:  Dynamic basketball toss while standing on airex pad, 2 full baskets  Activity Description: Random tapping of random colors on various surfaces (airex pad, slant board, 4" step, 6" step) Activity Setting:  Random Number of Pods:  6 Cycles/Sets:  x2  Duration (Time or Hit Count):  20   Patient Stats  Reaction Time:   2,573 1,396   Activity Description: Tapping only green lit blaze pod pods placed on various surfaces (airex pad, slant board, 4" step, 6" step) Activity Setting:  Focus Number of Pods:  6 Cycles/Sets:  x2  Duration (Time or Hit Count): 20   Patient Stats  Reaction Time:    2,709 2,188      PATIENT EDUCATION: Education details: Pt educated on role of PT and services provided during current POC, along with prognosis and information about the clinic.  Person educated: Patient Education method: Explanation Education comprehension: verbalized understanding  HOME EXERCISE PROGRAM:  Access Code: [email or web address] URL: [email or web address] Date: 08/17/2022 Prepared by: [place] with Resistance  - 2 x daily - 7 x weekly - 2 sets - 10 reps - Sidelying Reverse Clamshell with Resistance  - 2 x daily - 7 x weekly - 2 sets - 10 reps - Prone Hip Extension with Resistance Loop  - 2 x daily - 7 x weekly - 2 sets - 10 reps - Supine Bridge with Resistance Band  - 2 x daily - 7 x weekly - 2 sets - 10 reps - Side Stepping with Resistance at Thighs  - 2 x daily - 7 x weekly - 2 sets - 10 reps  GOALS:  Goals reviewed with patient? Yes  SHORT TERM GOALS: Target date: 08/31/2022  Pt will be independent with HEP in order to demonstrate increased ability to perform tasks related to occupation/hobbies. Baseline: Pt given HEP 08/13/22 and noted above Goal status: INITIAL   LONG TERM GOALS: Target date: 10/26/2022  1.  Patient (> 54 years old) will complete five times sit to stand test in < 10 seconds indicating an increased LE strength and improved balance. Baseline: 11.81 sec Goal status: INITIAL  2.  Patient will increase FOTO score to  equal to or greater than 64 to demonstrate statistically significant improvement in mobility and quality of life.  Baseline: FOTO - 58 Goal status: INITIAL   3.  Patient will increase Berg Balance score by > 6 points to demonstrate decreased fall risk during functional activities. Baseline: 47 Goal status: INITIAL   4.  Patient will reduce timed up and go to <11 seconds to reduce fall risk and demonstrate improved transfer/gait ability. Baseline: 12.98 sec Goal status: INITIAL  5.  Patient will  increase 10 meter walk test to >1.56ms as to improve gait speed for better community ambulation and to reduce fall risk. Baseline: 11.95 sec/0.84 m/s Goal status: INITIAL  6.  Patient will increase six minute walk test distance to >1000 for progression to community ambulator and improve gait ability Baseline: 875 ft with SPC Goal status: INITIAL    ASSESSMENT:  CLINICAL IMPRESSION:  Pt continues to perform well with tasks and is making good progress.  Pt challenged with balance related exercises today and performed well with the dynamic throwing from an unstable surface.  Pt also challenged with the varying surfaces during the blaze pod taps.  Pt will continue to challenge balance during subsequent sessions, while also improving his hip strength and mobility.   Pt will continue to benefit from skilled therapy to address remaining deficits in order to improve overall QoL and return to PLOF.       OBJECTIVE IMPAIRMENTS: Abnormal gait, decreased activity tolerance, decreased balance, decreased endurance, decreased mobility, difficulty walking, decreased strength, and decreased safety awareness.   ACTIVITY LIMITATIONS: carrying, lifting, bending, sitting, squatting, and locomotion level  PARTICIPATION LIMITATIONS: community activity and yard work  PERSONAL FACTORS: Age, Fitness, Past/current experiences, Time since onset of injury/illness/exacerbation, and 3+ comorbidities: DM2, Psoriasis, Prostate Cancer, GI complications, SOB on exertion  are also affecting patient's functional outcome.   REHAB POTENTIAL: Fair pt has significant influx of comorbidities that will likely impact their potential progress with PT.  CLINICAL DECISION MAKING: Stable/uncomplicated  EVALUATION COMPLEXITY: Moderate  PLAN:  PT FREQUENCY: 2x/week  PT DURATION: 12 weeks  PLANNED INTERVENTIONS: Therapeutic exercises, Therapeutic activity, Neuromuscular re-education, Balance training, Gait training,  Patient/Family education, Self Care, Joint mobilization, Joint manipulation, Stair training, Vestibular training, Canalith repositioning, Dry Needling, Electrical stimulation, Spinal manipulation, Spinal mobilization, Cryotherapy, Moist heat, and Manual therapy  PLAN FOR NEXT SESSION:   Continue to improve LE strengthening and balance training.   JGwenlyn Saran PT, DPT Physical Therapist- [facility] 08/20/22, 2:33 PM

## 2022-08-21 ENCOUNTER — Telehealth: Payer: Self-pay | Admitting: *Deleted

## 2022-08-21 NOTE — Telephone Encounter (Signed)
Notified patient as instructed, patient pleased. Discussed follow-up appointments, patient agrees  

## 2022-08-21 NOTE — Telephone Encounter (Signed)
-----  Message from Nori Riis, PA-C sent at 08/21/2022  8:00 AM EST ----- Will you let Mr. Gohr know that his PET scan did not show any signs that the prostate cancer has spread?  We need to set up an appointment to discuss the next steps.  I'll be in [place] on Monday, if he would like to come on Monday.

## 2022-08-23 NOTE — Progress Notes (Deleted)
08/24/2022 12:23 PM   Brian Callahan 24-Jul-1941 [id number]  Referring provider: Idelle Crouch, MD [place] [facility] [place],  [place] [address]  Urological history: 1. Prostate cancer -Unfavorable intermediate prostate cancer -PSA (03/2022) 5.7 -prostate MRI (04/2022) - PI-RADS category 5 lesion of the right anterior peripheral zone and anterior fibromuscular stroma at the apex - Transcapsular spread:  Absent - Seminal vesicle involvement: Absent - Neurovascular bundle involvement: Absent - Pelvic adenopathy: No pathologic adenopathy. Small perirectal lymph nodes, 0.5 cm bilaterally - Bone metastasis: Absent -fusion prostate biopsy (06/2022) -prostate, ROI-4: Prostatic adenocarcinoma, Gleason score 4+3 equal 7 (grade group 3) involves 50% of one of the 4 cores (pattern 4 equals 95%) -nm PET (PSMA) (07/2022) - Focal intense activity in the RIGHT prostate gland apex consistent primary prostate adenocarcinoma - No evidence metastatic adenopathy in the pelvis or periaortic retroperitoneum. - No evidence of visceral metastasis or skeletal metastasis.  2. BPH with LU TS -cysto, 12/2019 - Cystoscopy recently performed by Dr. Diamantina Providence remarkable for lateral lobe enlargement and elevated bladder neck without median lobe -Prostate volume by MRI 60 g -UroLift, 12/2019 x 4 implants  -Tamsulosin 0.4 mg daily    No chief complaint on file.   HPI: Brian Callahan is a 82 y.o. male who presents today for further discussion with his wife, [name].  He was seen in August 2023 for a PSA of 7.55 obtained by his PCP in July 2023.  Free and total PSA obtained March 18, 2022 noted a total PSA of 7.4, free percent PSA of 9.6 making the probability of having a prostate cancer greater than 55%.  His urine culture did return positive for Enterococcus coccus faecalis and he was treated with antibiotic and free and total PSA at that time indicated a PSA of 5.7 with a free percent PSA  still at 9.5 making the probability of a prostate cancer greater than 55%.  He then decided to undergo a prostate MRI which found a category 5 lesion in the right anterior peripheral zone and anterior fibromuscular stroma at the apex.  He then had a 4K score which noted a score of 22.7 which indicates a 40% chance of having high-grade prostate cancer if biopsy was performed today.    He continues to have frequent urination, weak/split urinary stream and lower back pain.  Patient denies any modifying or aggravating factors.  Patient denies any gross hematuria, dysuria or suprapubic/flank pain.  Patient denies any fevers, chills, nausea or vomiting.     PMH: Past Medical History:  Diagnosis Date   Anemia    pt denies 01/18/20   BP (high blood pressure) 05/23/2015   BPH with obstruction/lower urinary tract symptoms    COPD (chronic obstructive pulmonary disease) (HCC)    Diabetes mellitus without complication (HCC)    diet controlled   Dyspnea    Encounter for long-term (current) use of other high-risk medications    GERD (gastroesophageal reflux disease)    gastritis   GI bleed    Gout    H/O hernia repair mid-90s   x2   History of elevated PSA    History of [place] spotted fever    HLD (hyperlipidemia)    HTN (hypertension)    Hyperglycemia    Hyperuricemia    Incomplete bladder emptying    Macular degeneration    Obesity    Psoriasis    Psoriasis    Urinary frequency     Surgical History:  Past Surgical History:  Procedure Laterality Date   APPENDECTOMY     BACK SURGERY     neck and spine 2007   CERVICAL SPINE SURGERY  2007   CHOLECYSTECTOMY N/A 10/18/2017   Procedure: LAPAROSCOPIC CHOLECYSTECTOMY;  Surgeon: [name], MD;  Location: [facility] ORS;  Service: General;  Laterality: N/A;   COLECTOMY  1998   partial, due to obstruction   COLONOSCOPY     lost 10 units of blood   COLONOSCOPY WITH PROPOFOL N/A 09/27/2019   Procedure: COLONOSCOPY WITH PROPOFOL;   Surgeon: [name], [name], MD;  Location: [facility] ENDOSCOPY;  Service: Gastroenterology;  Laterality: N/A;   CYSTOSCOPY WITH INSERTION OF UROLIFT N/A 01/23/2020   Procedure: CYSTOSCOPY WITH INSERTION OF UROLIFT;  Surgeon: [name], MD;  Location: [facility] ORS;  Service: Urology;  Laterality: N/A;   ESOPHAGOGASTRODUODENOSCOPY N/A 10/14/2016   Procedure: ESOPHAGOGASTRODUODENOSCOPY (EGD);  Surgeon: [name], MD;  Location: [facility] ENDOSCOPY;  Service: Endoscopy;  Laterality: N/A;   ESOPHAGOGASTRODUODENOSCOPY (EGD) WITH PROPOFOL N/A 09/27/2019   Procedure: ESOPHAGOGASTRODUODENOSCOPY (EGD) WITH PROPOFOL;  Surgeon: [name], [name], MD;  Location: [facility] ENDOSCOPY;  Service: Gastroenterology;  Laterality: N/A;   EYE SURGERY     bilateral cataracts   HERNIA REPAIR  mid-90s   x2  inguinal   PROSTATE BIOPSY  2013   SURGERY SCROTAL / TESTICULAR     for fibrous growth    Home Medications:  Allergies as of 08/24/2022       Reactions   Isosorbide Nitrate Other (See Comments)   unkn   Shellfish Allergy Other (See Comments)   gout   Trazodone And Nefazodone Diarrhea        Medication List        Accurate as of August 23, 2022 12:23 PM. If you have any questions, ask your nurse or doctor.          acetaminophen 500 MG tablet Commonly known as: TYLENOL Take 500-1,000 mg by mouth every 6 (six) hours as needed (for headache/minor pain.).   clobetasol ointment 0.05 % Commonly known as: TEMOVATE Apply 1 application topically daily as needed (psoriasis).   cyanocobalamin 1000 MCG tablet Commonly known as: VITAMIN B12 Take 1,000 mcg by mouth daily.   enalapril 5 MG tablet Commonly known as: VASOTEC Take 1 tablet (5 mg total) by mouth 2 (two) times daily.   ezetimibe 10 MG tablet Commonly known as: ZETIA Take 10 mg by mouth daily.   Melatonin 10 MG Tabs Take 10 mg by mouth at bedtime as needed (sleeo).   metFORMIN 500 MG tablet Commonly known as: GLUCOPHAGE Take 1 tablet by  mouth 2 (two) times daily.   metoprolol succinate 50 MG 24 hr tablet Commonly known as: TOPROL-XL Take 1.5 tablets by mouth daily.   tamsulosin 0.4 MG Caps capsule Commonly known as: FLOMAX TAKE 1 CAPSULE BY MOUTH TWICE  DAILY   TART CHERRY ADVANCED PO Take 12 mg by mouth daily.        Allergies:  Allergies  Allergen Reactions   Isosorbide Nitrate Other (See Comments)    unkn   Shellfish Allergy Other (See Comments)    gout   Trazodone And Nefazodone Diarrhea    Family History: Family History  Problem Relation Age of Onset   Kidney failure Brother    Prostate cancer Neg Hx     Social History:  reports that he quit smoking about 31 years ago. His smoking use included cigarettes. He has a 2.50 pack-year smoking history.  He has been exposed to tobacco smoke. He has never used smokeless tobacco. He reports current alcohol use of about 18.0 standard drinks of alcohol per week. He reports that he does not use drugs.  ROS: Pertinent ROS in HPI  Physical Exam: There were no vitals taken for this visit.  Constitutional:  Well nourished. Alert and oriented, No acute distress. HEENT: [place] AT, moist mucus membranes.  Trachea midline Cardiovascular: No clubbing, cyanosis, or edema. Respiratory: Normal respiratory effort, no increased work of breathing. Neurologic: Grossly intact, no focal deficits, moving all 4 extremities. Psychiatric: Normal mood and affect.   Laboratory Data: WBC (White Blood Cell Count) 4.1 - 10.2 10^3/uL 6.1  RBC (Red Blood Cell Count) 4.69 - 6.13 10^6/uL 3.91 Low   Hemoglobin 14.1 - 18.1 gm/dL 12.3 Low   Hematocrit 40.0 - 52.0 % 38.3 Low   MCV (Mean Corpuscular Volume) 80.0 - 100.0 fl 98.0  MCH (Mean Corpuscular Hemoglobin) 27.0 - 31.2 pg 31.5 High   MCHC (Mean Corpuscular Hemoglobin Concentration) 32.0 - 36.0 gm/dL 32.1  Platelet Count 150 - 450 10^3/uL 260  RDW-CV (Red Cell Distribution Width) 11.6 - 14.8 % 13.5  MPV (Mean Platelet Volume) 9.4 -  12.4 fl 10.1  Neutrophils 1.50 - 7.80 10^3/uL 3.97  Lymphocytes 1.00 - 3.60 10^3/uL 1.17  Monocytes 0.00 - 1.50 10^3/uL 0.69  Eosinophils 0.00 - 0.55 10^3/uL 0.24  Basophils 0.00 - 0.09 10^3/uL 0.02  Neutrophil % 32.0 - 70.0 % 65.1  Lymphocyte % 10.0 - 50.0 % 19.2  Monocyte % 4.0 - 13.0 % 11.3  Eosinophil % 1.0 - 5.0 % 3.9  Basophil% 0.0 - 2.0 % 0.3  Immature Granulocyte % <=0.7 % 0.2  Immature Granulocyte Count <=0.06 10^3/L 0.01  Resulting Agency  [facility] - [facility]   Specimen Collected: 05/26/22 11:22   Performed by: [facility] Last Resulted: 05/26/22 13:26  Received From: [facility]  Result Received: 05/29/22 10:43   Glucose 70 - 110 mg/dL 101  Sodium 136 - 145 mmol/L 141  Potassium 3.6 - 5.1 mmol/L 4.1  Chloride 97 - 109 mmol/L 106  Carbon Dioxide (CO2) 22.0 - 32.0 mmol/L 31.6  Urea Nitrogen (BUN) 7 - 25 mg/dL 16  Creatinine 0.7 - 1.3 mg/dL 0.6 Low   Glomerular Filtration Rate (eGFR) >60 mL/min/1.73sq m 99  Comment: CKD-EPI (2021) does not include patient's race in the calculation of eGFR.  Monitoring changes of plasma creatinine and eGFR over time is useful for monitoring kidney function.  Interpretive Ranges for eGFR (CKD-EPI 2021):  eGFR:       >60 mL/min/1.73 sq. m - Normal eGFR:       30-59 mL/min/1.73 sq. m - Moderately Decreased eGFR:       15-29 mL/min/1.73 sq. m  - Severely Decreased eGFR:       < 15 mL/min/1.73 sq. m  - Kidney Failure   Note: These eGFR calculations do not apply in acute situations when eGFR is changing rapidly or patients on dialysis.  Calcium 8.7 - 10.3 mg/dL 9.0  AST 8 - 39 U/L 15  ALT 6 - 57 U/L 12  Alk Phos (alkaline Phosphatase) 34 - 104 U/L 61  Albumin 3.5 - 4.8 g/dL 4.3  Bilirubin, Total 0.3 - 1.2 mg/dL 0.5  Protein, Total 6.1 - 7.9 g/dL 6.8  A/G Ratio 1.0 - 5.0 gm/dL 1.7  Resulting Agency  [facility] - [facility]   Specimen Collected: 05/26/22 11:22   Performed  by: [place]: 05/26/22 16:20  Received From: [facility]  Result Received: 05/29/22 10:43   Color Colorless, Straw, Light Yellow, Yellow, Dark Yellow Light Yellow  Clarity Clear Clear  Specific Gravity 1.005 - 1.030 1.021  pH, Urine 5.0 - 8.0 5.5  Protein, Urinalysis Negative mg/dL Negative  Glucose, Urinalysis Negative mg/dL Negative  Ketones, Urinalysis Negative mg/dL Negative  Blood, Urinalysis Negative Negative  Nitrite, Urinalysis Negative Negative  Leukocyte Esterase, Urinalysis Negative Negative  Bilirubin, Urinalysis Negative Negative  Urobilinogen, Urinalysis 0.2 - 1.0 mg/dL 0.2  WBC, UA <=5 /hpf <1  Red Blood Cells, Urinalysis <=3 /hpf 1  Bacteria, Urinalysis 0 - 5 /hpf 0-5  Squamous Epithelial Cells, Urinalysis /hpf 0  Resulting Agency  [facility] - [facility]   Specimen Collected: 05/26/22 11:22   Performed by: [facility] - [facility] Last Resulted: 05/26/22 12:12  Received From: [facility]  Result Received: 05/29/22 10:43   Hemoglobin A1C 4.2 - 5.6 % 6.7 High   Average Blood Glucose (Calc) mg/dL [facility]  Narrative Performed by [facility] - [facility] Normal Range:    4.2 - 5.6% Increased Risk:  5.7 - 6.4% Diabetes:        >= 6.5% Glycemic Control for adults with diabetes:  <7%    Specimen Collected: 05/26/22 11:22   Performed by: [place]: 05/26/22 13:47  Received From: [facility]  Result Received: 05/29/22 10:43  I have reviewed the labs.   Pertinent Imaging: Narrative & Impression  CLINICAL DATA:  Elevated PSA level of 5.7 on 04/20/2022. Prior UroLift.   EXAM: MR PROSTATE WITHOUT AND WITH CONTRAST   TECHNIQUE: Multiplanar multisequence MRI images were obtained of the pelvis centered about the prostate. Pre and post contrast images were obtained.   CONTRAST:  7.6m GADAVIST GADOBUTROL 1 MMOL/ML IV  SOLN   COMPARISON:  None Available.   FINDINGS: Prostate:   The prostate gland is obscured in the region of the UroLift mainly on the diffusion-weighted images.   Region of interest # 1: PI-RADS category 5 lesion of the right anterior peripheral zone and anterior fibromuscular stroma at the apex, with focally reduced T2 signal (image 69, series 10) corresponding to low ADC map activity and restricted diffusion (image 23 of series 8 and 9) along with focal early enhancement (image 165 of series 13). This measures 0.54 cc (1.7 by 0.8 by 0.7 cm).   Encapsulated nodularity in the transition zone compatible with benign prostatic hypertrophy.   Urolift components noted.   Volume: 3D volumetric analysis: Prostate volume 60.03 cc (5.3 by 4.8 by 4.9 cm).   Transcapsular spread:  Absent   Seminal vesicle involvement: Absent   Neurovascular bundle involvement: Absent   Pelvic adenopathy: No pathologic adenopathy. Small perirectal lymph nodes, 0.5 cm bilaterally on image 36 series 15.   Bone metastasis: Absent   Other findings: Colonic diverticulosis. Degenerative arthropathy of the hips.   IMPRESSION: 1. PI-RADS category 5 lesion of the right anterior peripheral zone and anterior fibromuscular stroma at the apex. Targeting data sent to [facility] 2. Prostatomegaly and benign prostatic hypertrophy. 3. Colonic diverticulosis. 4. Degenerative arthropathy of the hips.     Electronically Signed   By: WVan ClinesM.D.   On: 05/20/2022 08:27    I have independently reviewed the films.  See HPI.      Assessment &  Plan:    1. Prostate cancer -discussed the NCCN treatment guidelines for unfavorable intermediate prostate cancer which includes observation, EBRT plus ADT and/or EBRT plus brachytherapy plus ADT -Explained that observation would include close monitoring of the PSA/DRE and symptoms and intervening when he should become symptomatic and/or there is evidence of  metastatic disease -explained the EBRT stands for external beam radiation therapy and is where the radiation is delivered from outside the body to a targeted area-side effects include feeling of sunburn on the skin where the radiation is directed, fatigue, and some men will experience radiation cystitis/proctitis 2 to 10 years out from their radiation treatment -Explained that brachytherapy consists of placing radioactive seeds directly into the prostate-side effects of this treatment include blood in the urine, burning with urination, urgency with urination and some urge incontinence but the symptoms diminish over time although in a few men the dysuria may persist for several months after procedure -Explained that ADT stands for androgen deprivation therapy and this consists of an injection of medication that will cause the body to shut down testosterone production, side effects of this will include pain at the injection site, hot flashes, fatigue, worsening of diabetes, decreased bone density, increased risk in cardiovascular disease and erectile dysfunction -offered referral to radiation oncology - ***  2. BPH with LUTS -continue tamsulosin 0.4 mg daily   No follow-ups on file.  These notes generated with voice recognition software. I apologize for typographical errors.  Royden Purl  [facility] [address]  [place], [place] [address] [phone]

## 2022-08-24 ENCOUNTER — Ambulatory Visit: Payer: Medicare Other | Admitting: Urology

## 2022-08-24 ENCOUNTER — Ambulatory Visit: Payer: Medicare Other

## 2022-08-24 DIAGNOSIS — N138 Other obstructive and reflux uropathy: Secondary | ICD-10-CM

## 2022-08-24 DIAGNOSIS — C61 Malignant neoplasm of prostate: Secondary | ICD-10-CM

## 2022-08-26 ENCOUNTER — Ambulatory Visit: Payer: Medicare Other

## 2022-08-27 ENCOUNTER — Ambulatory Visit: Payer: Medicare Other | Admitting: Urology

## 2022-08-27 VITALS — BP 188/72 | HR 64 | Wt 188.0 lb

## 2022-08-27 DIAGNOSIS — C61 Malignant neoplasm of prostate: Secondary | ICD-10-CM | POA: Diagnosis not present

## 2022-08-27 DIAGNOSIS — N401 Enlarged prostate with lower urinary tract symptoms: Secondary | ICD-10-CM | POA: Diagnosis not present

## 2022-08-27 NOTE — Progress Notes (Signed)
08/27/2022 11:44 AM   [name] 07/18/1941 [id number]  Referring provider: Idelle Crouch, MD [facility] [place],  [place] [address]  Urological history: 1. Prostate cancer -Unfavorable intermediate prostate cancer -PSA (03/2022) 5.7 -prostate MRI (04/2022) - PI-RADS category 5 lesion of the right anterior peripheral zone and anterior fibromuscular stroma at the apex - Transcapsular spread:  Absent - Seminal vesicle involvement: Absent - Neurovascular bundle involvement: Absent - Pelvic adenopathy: No pathologic adenopathy. Small perirectal lymph nodes, 0.5 cm bilaterally - Bone metastasis: Absent -fusion prostate biopsy (06/2022) -prostate, ROI-4: Prostatic adenocarcinoma, Gleason score 4+3 equal 7 (grade group 3) involves 50% of one of the 4 cores (pattern 4 equals 95%) -nm PET (PSMA) (07/2022) - Focal intense activity in the RIGHT prostate gland apex consistent primary prostate adenocarcinoma - No evidence metastatic adenopathy in the pelvis or periaortic retroperitoneum. - No evidence of visceral metastasis or skeletal metastasis.  2. BPH with LU TS -cysto, 12/2019 - Cystoscopy recently performed by Dr. Diamantina Providence remarkable for lateral lobe enlargement and elevated bladder neck without median lobe -Prostate volume by MRI 60 g -UroLift, 12/2019 x 4 implants  -Tamsulosin 0.4 mg daily  Chief Complaint  Patient presents with   Follow-up    HPI: Brian Callahan is a 82 y.o. male who presents today for further discussion with his wife, [name].  He was seen in August 2023 for a PSA of 7.55 obtained by his PCP in July 2023.  Free and total PSA obtained March 18, 2022 noted a total PSA of 7.4, free percent PSA of 9.6 making the probability of having a prostate cancer greater than 55%.  His urine culture did return positive for Enterococcus coccus faecalis and he was treated with antibiotic and free and total PSA at that time indicated a PSA of 5.7 with a  free percent PSA still at 9.5 making the probability of a prostate cancer greater than 55%.  He then decided to undergo a prostate MRI which found a category 5 lesion in the right anterior peripheral zone and anterior fibromuscular stroma at the apex.  He then had a 4K score which noted a score of 22.7 which indicates a 40% chance of having high-grade prostate cancer if biopsy was performed today.  A fusion prostate biopsy performed on July 24, 2022 resulted with prostate, ROI-4: Prostatic adenocarcinoma, Gleason score 4+3 equal 7 (grade group 3) involves 50% of one of the 4 cores (pattern 4 equals 95%).  PSMA-PET/CT scan on January 24th, 2024  noted focal intense activity in the RIGHT prostate gland apex consistent primary prostate adenocarcinoma No evidence metastatic adenopathy in the pelvis or periaortic retroperitoneum.  No evidence of visceral metastasis or skeletal metastasis.  PMH: Past Medical History:  Diagnosis Date   Anemia    pt denies 01/18/20   BP (high blood pressure) 05/23/2015   BPH with obstruction/lower urinary tract symptoms    COPD (chronic obstructive pulmonary disease) (HCC)    Diabetes mellitus without complication (HCC)    diet controlled   Dyspnea    Encounter for long-term (current) use of other high-risk medications    GERD (gastroesophageal reflux disease)    gastritis   GI bleed    Gout    H/O hernia repair mid-90s   x2   History of elevated PSA    History of [place] spotted fever    HLD (hyperlipidemia)    HTN (hypertension)    Hyperglycemia    Hyperuricemia  Incomplete bladder emptying    Macular degeneration    Obesity    Psoriasis    Psoriasis    Urinary frequency     Surgical History: Past Surgical History:  Procedure Laterality Date   APPENDECTOMY     BACK SURGERY     neck and spine 2007   CERVICAL SPINE SURGERY  2007   CHOLECYSTECTOMY N/A 10/18/2017   Procedure: LAPAROSCOPIC CHOLECYSTECTOMY;  Surgeon: [name],  MD;  Location: [facility] ORS;  Service: General;  Laterality: N/A;   COLECTOMY  1998   partial, due to obstruction   COLONOSCOPY     lost 10 units of blood   COLONOSCOPY WITH PROPOFOL N/A 09/27/2019   Procedure: COLONOSCOPY WITH PROPOFOL;  Surgeon: [name], [name], MD;  Location: [facility] ENDOSCOPY;  Service: Gastroenterology;  Laterality: N/A;   CYSTOSCOPY WITH INSERTION OF UROLIFT N/A 01/23/2020   Procedure: CYSTOSCOPY WITH INSERTION OF UROLIFT;  Surgeon: [name], MD;  Location: [facility] ORS;  Service: Urology;  Laterality: N/A;   ESOPHAGOGASTRODUODENOSCOPY N/A 10/14/2016   Procedure: ESOPHAGOGASTRODUODENOSCOPY (EGD);  Surgeon: [name], MD;  Location: [facility] ENDOSCOPY;  Service: Endoscopy;  Laterality: N/A;   ESOPHAGOGASTRODUODENOSCOPY (EGD) WITH PROPOFOL N/A 09/27/2019   Procedure: ESOPHAGOGASTRODUODENOSCOPY (EGD) WITH PROPOFOL;  Surgeon: [name], [name], MD;  Location: [facility] ENDOSCOPY;  Service: Gastroenterology;  Laterality: N/A;   EYE SURGERY     bilateral cataracts   HERNIA REPAIR  mid-90s   x2  inguinal   PROSTATE BIOPSY  2013   SURGERY SCROTAL / TESTICULAR     for fibrous growth    Home Medications:  Allergies as of 08/27/2022       Reactions   Isosorbide Nitrate Other (See Comments)   unkn   Shellfish Allergy Other (See Comments)   gout   Trazodone And Nefazodone Diarrhea        Medication List        Accurate as of August 27, 2022 11:59 PM. If you have any questions, ask your nurse or doctor.          acetaminophen 500 MG tablet Commonly known as: TYLENOL Take 500-1,000 mg by mouth every 6 (six) hours as needed (for headache/minor pain.).   clobetasol ointment 0.05 % Commonly known as: TEMOVATE Apply 1 application topically daily as needed (psoriasis).   cyanocobalamin 1000 MCG tablet Commonly known as: VITAMIN B12 Take 1,000 mcg by mouth daily.   enalapril 5 MG tablet Commonly known as: VASOTEC Take 1 tablet (5 mg total) by mouth 2 (two) times  daily.   ezetimibe 10 MG tablet Commonly known as: ZETIA Take 10 mg by mouth daily.   Ilumya 100 MG/ML subcutaneous injection Generic drug: tildrakizumab-asmn Inject 100 mg into the skin once.   Melatonin 10 MG Tabs Take 10 mg by mouth at bedtime as needed (sleeo).   metFORMIN 500 MG tablet Commonly known as: GLUCOPHAGE Take 1 tablet by mouth 2 (two) times daily.   metoprolol succinate 50 MG 24 hr tablet Commonly known as: TOPROL-XL Take 1.5 tablets by mouth daily.   tamsulosin 0.4 MG Caps capsule Commonly known as: FLOMAX TAKE 1 CAPSULE BY MOUTH TWICE  DAILY   TART CHERRY ADVANCED PO Take 12 mg by mouth daily.        Allergies:  Allergies  Allergen Reactions   Isosorbide Nitrate Other (See Comments)    unkn   Shellfish Allergy Other (See Comments)    gout   Trazodone And Nefazodone Diarrhea    Family History:  Family History  Problem Relation Age of Onset   Kidney failure Brother    Prostate cancer Neg Hx     Social History:  reports that he quit smoking about 31 years ago. His smoking use included cigarettes. He has a 2.50 pack-year smoking history. He has been exposed to tobacco smoke. He has never used smokeless tobacco. He reports current alcohol use of about 18.0 standard drinks of alcohol per week. He reports that he does not use drugs.  ROS: Pertinent ROS in HPI  Physical Exam: BP (!) 188/72   Pulse 64   Wt 188 lb (85.3 kg)   BMI 29.44 kg/m   Constitutional:  Well nourished. Alert and oriented, No acute distress. HEENT: [place] AT, moist mucus membranes.  Trachea midline Cardiovascular: No clubbing, cyanosis, or edema. Respiratory: Normal respiratory effort, no increased work of breathing. Neurologic: Grossly intact, no focal deficits, moving all 4 extremities. Psychiatric: Normal mood and affect.   Laboratory Data: WBC (White Blood Cell Count) 4.1 - 10.2 10^3/uL 6.1  RBC (Red Blood Cell Count) 4.69 - 6.13 10^6/uL 3.91 Low   Hemoglobin 14.1 -  18.1 gm/dL 12.3 Low   Hematocrit 40.0 - 52.0 % 38.3 Low   MCV (Mean Corpuscular Volume) 80.0 - 100.0 fl 98.0  MCH (Mean Corpuscular Hemoglobin) 27.0 - 31.2 pg 31.5 High   MCHC (Mean Corpuscular Hemoglobin Concentration) 32.0 - 36.0 gm/dL 32.1  Platelet Count 150 - 450 10^3/uL 260  RDW-CV (Red Cell Distribution Width) 11.6 - 14.8 % 13.5  MPV (Mean Platelet Volume) 9.4 - 12.4 fl 10.1  Neutrophils 1.50 - 7.80 10^3/uL 3.97  Lymphocytes 1.00 - 3.60 10^3/uL 1.17  Monocytes 0.00 - 1.50 10^3/uL 0.69  Eosinophils 0.00 - 0.55 10^3/uL 0.24  Basophils 0.00 - 0.09 10^3/uL 0.02  Neutrophil % 32.0 - 70.0 % 65.1  Lymphocyte % 10.0 - 50.0 % 19.2  Monocyte % 4.0 - 13.0 % 11.3  Eosinophil % 1.0 - 5.0 % 3.9  Basophil% 0.0 - 2.0 % 0.3  Immature Granulocyte % <=0.7 % 0.2  Immature Granulocyte Count <=0.06 10^3/L 0.01  Resulting Agency  [facility] - LAB   Specimen Collected: 05/26/22 11:22   Performed by: [facility] - LAB Last Resulted: 05/26/22 13:26  Received From: [facility]  Result Received: 05/29/22 10:43   Glucose 70 - 110 mg/dL 101  Sodium 136 - 145 mmol/L 141  Potassium 3.6 - 5.1 mmol/L 4.1  Chloride 97 - 109 mmol/L 106  Carbon Dioxide (CO2) 22.0 - 32.0 mmol/L 31.6  Urea Nitrogen (BUN) 7 - 25 mg/dL 16  Creatinine 0.7 - 1.3 mg/dL 0.6 Low   Glomerular Filtration Rate (eGFR) >60 mL/min/1.73sq m 99  Comment: CKD-EPI (2021) does not include patient's race in the calculation of eGFR.  Monitoring changes of plasma creatinine and eGFR over time is useful for monitoring kidney function.  Interpretive Ranges for eGFR (CKD-EPI 2021):  eGFR:       >60 mL/min/1.73 sq. m - Normal eGFR:       30-59 mL/min/1.73 sq. m - Moderately Decreased eGFR:       15-29 mL/min/1.73 sq. m  - Severely Decreased eGFR:       < 15 mL/min/1.73 sq. m  - Kidney Failure   Note: These eGFR calculations do not apply in acute situations when eGFR is changing rapidly or patients on  dialysis.  Calcium 8.7 - 10.3 mg/dL 9.0  AST 8 - 39 U/L 15  ALT 6 - 57  U/L 12  Alk Phos (alkaline Phosphatase) 34 - 104 U/L 61  Albumin 3.5 - 4.8 g/dL 4.3  Bilirubin, Total 0.3 - 1.2 mg/dL 0.5  Protein, Total 6.1 - 7.9 g/dL 6.8  A/G Ratio 1.0 - 5.0 gm/dL 1.7  Resulting Agency  [facility] - LAB   Specimen Collected: 05/26/22 11:22   Performed by: [place]: 05/26/22 16:20  Received From: [facility]  Result Received: 05/29/22 10:43   Color Colorless, Straw, Light Yellow, Yellow, Dark Yellow Light Yellow  Clarity Clear Clear  Specific Gravity 1.005 - 1.030 1.021  pH, Urine 5.0 - 8.0 5.5  Protein, Urinalysis Negative mg/dL Negative  Glucose, Urinalysis Negative mg/dL Negative  Ketones, Urinalysis Negative mg/dL Negative  Blood, Urinalysis Negative Negative  Nitrite, Urinalysis Negative Negative  Leukocyte Esterase, Urinalysis Negative Negative  Bilirubin, Urinalysis Negative Negative  Urobilinogen, Urinalysis 0.2 - 1.0 mg/dL 0.2  WBC, UA <=5 /hpf <1  Red Blood Cells, Urinalysis <=3 /hpf 1  Bacteria, Urinalysis 0 - 5 /hpf 0-5  Squamous Epithelial Cells, Urinalysis /hpf 0  Resulting Agency  [facility] - LAB   Specimen Collected: 05/26/22 11:22   Performed by: [facility] - LAB Last Resulted: 05/26/22 12:12  Received From: [facility]  Result Received: 05/29/22 10:43   Hemoglobin A1C 4.2 - 5.6 % 6.7 High   Average Blood Glucose (Calc) mg/dL [facility] - [facility]  Narrative Performed by [facility] - LAB Normal Range:    4.2 - 5.6% Increased Risk:  5.7 - 6.4% Diabetes:        >= 6.5% Glycemic Control for adults with diabetes:  <7%    Specimen Collected: 05/26/22 11:22   Performed by: [facility] - LAB Last Resulted: 05/26/22 13:47  Received From: [facility]  Result Received: 05/29/22 10:43  I have reviewed the  labs.   Pertinent Imaging: Narrative & Impression  CLINICAL DATA:  Elevated PSA level of 5.7 on 04/20/2022. Prior UroLift.   EXAM: MR PROSTATE WITHOUT AND WITH CONTRAST   TECHNIQUE: Multiplanar multisequence MRI images were obtained of the pelvis centered about the prostate. Pre and post contrast images were obtained.   CONTRAST:  7.29m GADAVIST GADOBUTROL 1 MMOL/ML IV SOLN   COMPARISON:  None Available.   FINDINGS: Prostate:   The prostate gland is obscured in the region of the UroLift mainly on the diffusion-weighted images.   Region of interest # 1: PI-RADS category 5 lesion of the right anterior peripheral zone and anterior fibromuscular stroma at the apex, with focally reduced T2 signal (image 69, series 10) corresponding to low ADC map activity and restricted diffusion (image 23 of series 8 and 9) along with focal early enhancement (image 165 of series 13). This measures 0.54 cc (1.7 by 0.8 by 0.7 cm).   Encapsulated nodularity in the transition zone compatible with benign prostatic hypertrophy.   Urolift components noted.   Volume: 3D volumetric analysis: Prostate volume 60.03 cc (5.3 by 4.8 by 4.9 cm).   Transcapsular spread:  Absent   Seminal vesicle involvement: Absent   Neurovascular bundle involvement: Absent   Pelvic adenopathy: No pathologic adenopathy. Small perirectal lymph nodes, 0.5 cm bilaterally on image 36 series 15.   Bone metastasis: Absent   Other findings: Colonic diverticulosis. Degenerative arthropathy of the hips.   IMPRESSION: 1. PI-RADS category 5 lesion of the right anterior peripheral zone and anterior fibromuscular  stroma at the apex. Targeting data sent to [place]. 2. Prostatomegaly and benign prostatic hypertrophy. 3. Colonic diverticulosis. 4. Degenerative arthropathy of the hips.     Electronically Signed   By: Van Clines M.D.   On: 05/20/2022 08:27    I have independently reviewed the films.  See HPI.       Assessment & Plan:    1. Prostate cancer -discussed the NCCN treatment guidelines for unfavorable intermediate prostate cancer which includes observation, EBRT plus ADT and/or EBRT plus brachytherapy plus ADT -Explained that observation would include close monitoring of the PSA/DRE and symptoms and intervening when he should become symptomatic and/or there is evidence of metastatic disease -explained the EBRT stands for external beam radiation therapy and is where the radiation is delivered from outside the body to a targeted area-side effects include feeling of sunburn on the skin where the radiation is directed, fatigue, and some men will experience radiation cystitis/proctitis 2 to 10 years out from their radiation treatment -Explained that brachytherapy consists of placing radioactive seeds directly into the prostate-side effects of this treatment include blood in the urine, burning with urination, urgency with urination and some urge incontinence but the symptoms diminish over time although in a few men the dysuria may persist for several months after procedure -Explained that ADT stands for androgen deprivation therapy and this consists of an injection of medication that will cause the body to shut down testosterone production, side effects of this will include pain at the injection site, hot flashes, fatigue, worsening of diabetes, decreased bone density, increased risk in cardiovascular disease and erectile dysfunction -offered referral to radiation oncology -given the book " 100 questions and answers about prostate cancer" -He would like to take time to think about his treatment options and he will contact the office with his decision at a later date  2. BPH with LUTS -continue tamsulosin 0.4 mg daily   Return for patient will call .  These notes generated with voice recognition software. I apologize for typographical errors.  Royden Purl  [facility] [address]  [place], [place] [address] [phone]   I spent 30 minutes on the day of the encounter to include pre-visit record review, face-to-face time with the patient, and post-visit ordering of tests.

## 2022-08-28 ENCOUNTER — Encounter: Payer: Self-pay | Admitting: Urology

## 2022-08-31 ENCOUNTER — Ambulatory Visit: Payer: Medicare Other | Attending: Neurology

## 2022-08-31 ENCOUNTER — Telehealth: Payer: Self-pay | Admitting: *Deleted

## 2022-08-31 ENCOUNTER — Other Ambulatory Visit: Payer: Self-pay | Admitting: Urology

## 2022-08-31 DIAGNOSIS — M6281 Muscle weakness (generalized): Secondary | ICD-10-CM | POA: Insufficient documentation

## 2022-08-31 DIAGNOSIS — R262 Difficulty in walking, not elsewhere classified: Secondary | ICD-10-CM | POA: Insufficient documentation

## 2022-08-31 DIAGNOSIS — C61 Malignant neoplasm of prostate: Secondary | ICD-10-CM

## 2022-08-31 NOTE — Telephone Encounter (Signed)
Patient would like a ref to Dr. Baruch Gouty office per patient

## 2022-08-31 NOTE — Therapy (Signed)
OUTPATIENT PHYSICAL THERAPY NEURO TREATMENT   Patient Name: CREW GOREN MRN: [id number] DOB:1941/04/28, 82 y.o., male Today's Date: 08/31/2022   PCP: [name], MD REFERRING PROVIDER: [name], MD  END OF SESSION:   PT End of Session - 08/31/22 1146     Visit Number 7    Number of Visits 24    Date for PT Re-Evaluation 10/26/22    PT Start Time 1147    PT Stop Time 24    PT Time Calculation (min) 43 min    Equipment Utilized During Treatment Gait belt    Activity Tolerance Patient tolerated treatment well    Behavior During Therapy WFL for tasks assessed/performed                Past Medical History:  Diagnosis Date   Anemia    pt denies 01/18/20   BP (high blood pressure) 05/23/2015   BPH with obstruction/lower urinary tract symptoms    COPD (chronic obstructive pulmonary disease) ([place])    Diabetes mellitus without complication ([place])    diet controlled   Dyspnea    Encounter for long-term (current) use of other high-risk medications    GERD (gastroesophageal reflux disease)    gastritis   GI bleed    Gout    H/O hernia repair mid-90s   x2   History of elevated PSA    History of Rocky Mountain spotted fever    HLD (hyperlipidemia)    HTN (hypertension)    Hyperglycemia    Hyperuricemia    Incomplete bladder emptying    Macular degeneration    Obesity    Psoriasis    Psoriasis    Urinary frequency    Past Surgical History:  Procedure Laterality Date   APPENDECTOMY     BACK SURGERY     neck and spine 2007   CERVICAL SPINE SURGERY  2007   CHOLECYSTECTOMY N/A 10/18/2017   Procedure: LAPAROSCOPIC CHOLECYSTECTOMY;  Surgeon: [name], MD;  Location: [facility] ORS;  Service: General;  Laterality: N/A;   COLECTOMY  1998   partial, due to obstruction   COLONOSCOPY     lost 10 units of blood   COLONOSCOPY WITH PROPOFOL N/A 09/27/2019   Procedure: COLONOSCOPY WITH PROPOFOL;  Surgeon: [name], [name], MD;  Location: [facility] ENDOSCOPY;   Service: Gastroenterology;  Laterality: N/A;   CYSTOSCOPY WITH INSERTION OF UROLIFT N/A 01/23/2020   Procedure: CYSTOSCOPY WITH INSERTION OF UROLIFT;  Surgeon: [name], MD;  Location: [facility] ORS;  Service: Urology;  Laterality: N/A;   ESOPHAGOGASTRODUODENOSCOPY N/A 10/14/2016   Procedure: ESOPHAGOGASTRODUODENOSCOPY (EGD);  Surgeon: [name], MD;  [facility];  Service: Endoscopy;  Laterality: N/A;   ESOPHAGOGASTRODUODENOSCOPY (EGD) WITH PROPOFOL N/A 09/27/2019   Procedure: ESOPHAGOGASTRODUODENOSCOPY (EGD) WITH PROPOFOL;  Surgeon: [name], [name], MD;  Location: [facility] ENDOSCOPY;  Service: Gastroenterology;  Laterality: N/A;   EYE SURGERY     bilateral cataracts   HERNIA REPAIR  mid-90s   x2  inguinal   PROSTATE BIOPSY  2013   SURGERY SCROTAL / TESTICULAR     for fibrous growth   Patient Active Problem List   Diagnosis Date Noted   Diabetes mellitus type 2 with complications ([place]) 50/03/3817   Chest pain with moderate risk for cardiac etiology 11/29/2017   Positive cardiac stress test 11/29/2017   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 11/08/2017   Esophagitis, reflux 11/08/2017   SOB (shortness of breath) on exertion 07/02/2017   BPH with obstruction/lower urinary tract symptoms  11/21/2015   History of elevated PSA 11/21/2015   H/O gastric ulcer 09/05/2015   Disorder of eyeball 05/23/2015   H/O infectious disease 05/23/2015   Blood glucose elevated 05/23/2015   BP (high blood pressure) 05/23/2015   HLD (hyperlipidemia) 05/23/2015   Psoriasis 05/23/2015   Absolute anemia 01/02/2014   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   Elevated prostate specific antigen (PSA) 01/02/2014   Elevated blood uric acid level 01/02/2014   Enlarged prostate without lower urinary tract symptoms (luts) 01/02/2014   Bleeding gastrointestinal 04/15/2012    ONSET DATE: ~10 years  REFERRING DIAG:  R26.89 (ICD-10-CM) - Balance problem  R26.9 (ICD-10-CM) - Gait abnormality     THERAPY DIAG:  Difficulty in walking, not elsewhere classified  Muscle weakness (generalized)  Rationale for Evaluation and Treatment: Rehabilitation   SUBJECTIVE:                                                                                                                                                                                             SUBJECTIVE STATEMENT:  Pt reports he is somewhat tired from battling a cold for the past week.  Pt just attempted to drink a lot of fluids and rest.   Pt accompanied by: self  PERTINENT HISTORY:   Pt is 82 y.o. male who has experienced a decline in his overall functional mobility and stability since Covid and being unable to go back to the gym.  Pt notes that he has noticed it being increasingly difficult performing tasks without losing his balance and just feels generalized weakness.  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    PAIN: Are you having pain? No  PRECAUTIONS: None  WEIGHT BEARING RESTRICTIONS: No  FALLS: Has patient fallen in last 6 months? No  LIVING ENVIRONMENT: Lives with: lives with their spouse Lives in: House/apartment Stairs: Yes: External: 4 steps; on right going up Has following equipment at home: Single point cane, Walker - 2 wheeled, and Grab bars  PLOF: Independent; pt does state he has difficulty tying shoes, but he has slip on shoes that assist with that.  PATIENT GOALS: Pt wants to be more mobile and be able to exercise.  OBJECTIVE:   DIAGNOSTIC FINDINGS:   EXAM 05/29/22: MRI HEAD WITHOUT CONTRAST  IMPRESSION: 1. No acute intracranial abnormality. 2. 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 3. Small chronic right cerebellar infarct.  COGNITION: Overall cognitive status: Within functional limits for tasks  assessed   SENSATION: WFL  COORDINATION: WFL  POSTURE: rounded shoulders and forward head   LOWER EXTREMITY MMT:    MMT Right Eval Left Eval  Hip flexion 5 4+  Hip abduction 4+ 4+  Hip adduction 4+ 4+  Knee flexion 5 4+  Knee extension 5 4+  Ankle dorsiflexion 5 5  (Blank rows = not tested)   TRANSFERS: Assistive device utilized: Single point cane  Sit to stand: Modified independence Stand to sit: Modified independence Chair to chair: Modified independence   GAIT: Gait pattern: step through pattern, decreased arm swing- Right, decreased arm swing- Left, decreased stride length, and lateral lean- Left Distance walked: 34' Assistive device utilized: Single point cane Level of assistance: Modified independence with AD Comments: Pt able to ambulate fair with some abnormalities in his gait pattern.  FUNCTIONAL TESTS:  5 times sit to stand: 11.81 Timed up and go (TUG): 12.98 6 minute walk test: Not performed at evaluation 10 meter walk test: 0.84 m/s Berg Balance Scale: 47  PATIENT SURVEYS:  FOTO 58/58  TODAY'S TREATMENT: DATE: 08/31/22   TherEx:  STS while holding 15# KB, 2x15 Deadlift with 15# KB on 6" elevated surface, 2x10 with good carryover from last visit Seated resisted marching with 7.5# AW on thigh, 2x15 Farmer's carry with 2 15# KB's x2 laps around the gym, 1 seated rest break in between Standing calf raises while holding 2 15# KB in each UE, 2x15 Standing hip abduction with BTB, UE support utilized, 2x10 each LE Standing resisted marches with BTB, UE support utilized, 2x10 each LE Standing hip abduction with BTB, UE utilized, 2x10 each LE Resisted lateral walking with bent knees and BTB resistance applied at distal thigh, 4 laps, 20' each direction    PATIENT EDUCATION: Education details: Pt educated on role of PT and services provided during current POC, along with prognosis and information about the clinic.  Person educated:  Patient Education method: Explanation Education comprehension: verbalized understanding  HOME EXERCISE PROGRAM:  Access Code: [email or web address] URL: [email or web address] Date: 08/17/2022 Prepared by: [place] with Resistance  - 2 x daily - 7 x weekly - 2 sets - 10 reps - Sidelying Reverse Clamshell with Resistance  - 2 x daily - 7 x weekly - 2 sets - 10 reps - Prone Hip Extension with Resistance Loop  - 2 x daily - 7 x weekly - 2 sets - 10 reps - Supine Bridge with Resistance Band  - 2 x daily - 7 x weekly - 2 sets - 10 reps - Side Stepping with Resistance at Thighs  - 2 x daily - 7 x weekly - 2 sets - 10 reps  GOALS:  Goals reviewed with patient? Yes  SHORT TERM GOALS: Target date: 08/31/2022  Pt will be independent with HEP in order to demonstrate increased ability to perform tasks related to occupation/hobbies. Baseline: Pt given HEP 08/13/22 and noted above Goal status: INITIAL   LONG TERM GOALS: Target date: 10/26/2022  1.  Patient (> 34 years old) will complete five times sit to stand test in < 10 seconds indicating an increased LE strength and improved balance. Baseline: 11.81 sec Goal status: INITIAL  2.  Patient will increase FOTO score to equal to or greater than 64 to demonstrate statistically significant improvement in mobility and quality of life.  Baseline: FOTO - 58 Goal status: INITIAL   3.  Patient will increase Berg Balance score by > 6 points to demonstrate decreased fall  risk during functional activities. Baseline: 47 Goal status: INITIAL   4.  Patient will reduce timed up and go to <11 seconds to reduce fall risk and demonstrate improved transfer/gait ability. Baseline: 12.98 sec Goal status: INITIAL  5.  Patient will increase 10 meter walk test to >1.42ms as to improve gait speed for better community ambulation and to reduce fall risk. Baseline: 11.95 sec/0.84 m/s Goal status: INITIAL  6.  Patient will increase six  minute walk test distance to >1000 for progression to community ambulator and improve gait ability Baseline: 875 ft with SPC Goal status: INITIAL    ASSESSMENT:  CLINICAL IMPRESSION:  Pt somewhat down during the session and even reported feeling some depression due to getting discouraging news from his urologist.  Pt encouraged by therapist that his oncologist would be the one best served to discuss his options for cancer treatment.  Pt still performed well and is demonstrating increased strength during session.  Pt to continue with current POC In order to improve his strength.   Pt will continue to benefit from skilled therapy to address remaining deficits in order to improve overall QoL and return to PLOF.        OBJECTIVE IMPAIRMENTS: Abnormal gait, decreased activity tolerance, decreased balance, decreased endurance, decreased mobility, difficulty walking, decreased strength, and decreased safety awareness.   ACTIVITY LIMITATIONS: carrying, lifting, bending, sitting, squatting, and locomotion level  PARTICIPATION LIMITATIONS: community activity and yard work  PERSONAL FACTORS: Age, Fitness, Past/current experiences, Time since onset of injury/illness/exacerbation, and 3+ comorbidities: DM2, Psoriasis, Prostate Cancer, GI complications, SOB on exertion  are also affecting patient's functional outcome.   REHAB POTENTIAL: Fair pt has significant influx of comorbidities that will likely impact their potential progress with PT.  CLINICAL DECISION MAKING: Stable/uncomplicated  EVALUATION COMPLEXITY: Moderate  PLAN:  PT FREQUENCY: 2x/week  PT DURATION: 12 weeks  PLANNED INTERVENTIONS: Therapeutic exercises, Therapeutic activity, Neuromuscular re-education, Balance training, Gait training, Patient/Family education, Self Care, Joint mobilization, Joint manipulation, Stair training, Vestibular training, Canalith repositioning, Dry Needling, Electrical stimulation, Spinal manipulation,  Spinal mobilization, Cryotherapy, Moist heat, and Manual therapy  PLAN FOR NEXT SESSION:   Continue to improve LE strengthening and balance training.   JGwenlyn Saran PT, DPT Physical Therapist- [facility] 08/31/22, 3:35 PM

## 2022-09-02 ENCOUNTER — Ambulatory Visit: Payer: Medicare Other

## 2022-09-02 DIAGNOSIS — M6281 Muscle weakness (generalized): Secondary | ICD-10-CM

## 2022-09-02 DIAGNOSIS — R262 Difficulty in walking, not elsewhere classified: Secondary | ICD-10-CM

## 2022-09-02 NOTE — Therapy (Signed)
OUTPATIENT PHYSICAL THERAPY NEURO TREATMENT   Patient Name: Brian Callahan MRN: [id number] DOB:April 23, 1941, 82 y.o., male Today's Date: 09/02/2022   PCP: [name], MD REFERRING PROVIDER: [name], MD  END OF SESSION:   PT End of Session - 09/02/22 1209     Visit Number 8    Number of Visits 24    Date for PT Re-Evaluation 10/26/22    PT Start Time 3500    PT Stop Time 1225    PT Time Calculation (min) 40 min    Equipment Utilized During Treatment Gait belt    Activity Tolerance Patient tolerated treatment well    Behavior During Therapy WFL for tasks assessed/performed                Past Medical History:  Diagnosis Date   Anemia    pt denies 01/18/20   BP (high blood pressure) 05/23/2015   BPH with obstruction/lower urinary tract symptoms    COPD (chronic obstructive pulmonary disease) ([place])    Diabetes mellitus without complication ([place])    diet controlled   Dyspnea    Encounter for long-term (current) use of other high-risk medications    GERD (gastroesophageal reflux disease)    gastritis   GI bleed    Gout    H/O hernia repair mid-90s   x2   History of elevated PSA    History of Rocky Mountain spotted fever    HLD (hyperlipidemia)    HTN (hypertension)    Hyperglycemia    Hyperuricemia    Incomplete bladder emptying    Macular degeneration    Obesity    Psoriasis    Psoriasis    Urinary frequency    Past Surgical History:  Procedure Laterality Date   APPENDECTOMY     BACK SURGERY     neck and spine 2007   CERVICAL SPINE SURGERY  2007   CHOLECYSTECTOMY N/A 10/18/2017   Procedure: LAPAROSCOPIC CHOLECYSTECTOMY;  Surgeon: [name], MD;  Location: [facility] ORS;  Service: General;  Laterality: N/A;   COLECTOMY  1998   partial, due to obstruction   COLONOSCOPY     lost 10 units of blood   COLONOSCOPY WITH PROPOFOL N/A 09/27/2019   Procedure: COLONOSCOPY WITH PROPOFOL;  Surgeon: [name], [name], MD;  Location: [facility] ENDOSCOPY;   Service: Gastroenterology;  Laterality: N/A;   CYSTOSCOPY WITH INSERTION OF UROLIFT N/A 01/23/2020   Procedure: CYSTOSCOPY WITH INSERTION OF UROLIFT;  Surgeon: [name], MD;  Location: [facility] ORS;  Service: Urology;  Laterality: N/A;   ESOPHAGOGASTRODUODENOSCOPY N/A 10/14/2016   Procedure: ESOPHAGOGASTRODUODENOSCOPY (EGD);  Surgeon: [name], MD;  Location: [facility] ENDOSCOPY;  Service: Endoscopy;  Laterality: N/A;   ESOPHAGOGASTRODUODENOSCOPY (EGD) WITH PROPOFOL N/A 09/27/2019   Procedure: ESOPHAGOGASTRODUODENOSCOPY (EGD) WITH PROPOFOL;  Surgeon: [name], [name], MD;  Location: [facility] ENDOSCOPY;  Service: Gastroenterology;  Laterality: N/A;   EYE SURGERY     bilateral cataracts   HERNIA REPAIR  mid-90s   x2  inguinal   PROSTATE BIOPSY  2013   SURGERY SCROTAL / TESTICULAR     for fibrous growth   Patient Active Problem List   Diagnosis Date Noted   Diabetes mellitus type 2 with complications (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 11/08/2017   Esophagitis, reflux 11/08/2017   SOB (shortness of breath) on exertion 07/02/2017   BPH with obstruction/lower urinary tract symptoms  11/21/2015   History of elevated PSA 11/21/2015   H/O gastric ulcer 09/05/2015   Disorder of eyeball 05/23/2015   H/O infectious disease 05/23/2015   Blood glucose elevated 05/23/2015   BP (high blood pressure) 05/23/2015   HLD (hyperlipidemia) 05/23/2015   Psoriasis 05/23/2015   Absolute anemia 01/02/2014   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   Elevated prostate specific antigen (PSA) 01/02/2014   Elevated blood uric acid level 01/02/2014   Enlarged prostate without lower urinary tract symptoms (luts) 01/02/2014   Bleeding gastrointestinal 04/15/2012    ONSET DATE: ~10 years  REFERRING DIAG:  R26.89 (ICD-10-CM) - Balance problem  R26.9 (ICD-10-CM) - Gait abnormality     THERAPY DIAG:  Difficulty in walking, not elsewhere classified  Muscle weakness (generalized)  Rationale for Evaluation and Treatment: Rehabilitation   SUBJECTIVE:                                                                                                                                                                                             SUBJECTIVE STATEMENT:  Pt reports he has rescheduled his oncologist appointment for next week but is looking forward to being able to speak to them.     Pt accompanied by: self  PERTINENT HISTORY:   Pt is 82 y.o. male who has experienced a decline in his overall functional mobility and stability since Covid and being unable to go back to the gym.  Pt notes that he has noticed it being increasingly difficult performing tasks without losing his balance and just feels generalized weakness.  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    PAIN: Are you having pain? No  PRECAUTIONS: None  WEIGHT BEARING RESTRICTIONS: No  FALLS: Has patient fallen in last 6 months? No  LIVING ENVIRONMENT: Lives with: lives with their spouse Lives in: House/apartment Stairs: Yes: External: 4 steps; on right going up Has following equipment at home: Single point cane, Walker - 2 wheeled, and Grab bars  PLOF: Independent; pt does state he has difficulty tying shoes, but he has slip on shoes that assist with that.  PATIENT GOALS: Pt wants to be more mobile and be able to exercise.  OBJECTIVE:   DIAGNOSTIC FINDINGS:   EXAM 05/29/22: MRI HEAD WITHOUT CONTRAST  IMPRESSION: 1. No acute intracranial abnormality. 2. 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 3. Small chronic right cerebellar infarct.  COGNITION: Overall cognitive status: Within functional limits for tasks  assessed  SENSATION: WFL  COORDINATION: WFL  POSTURE: rounded shoulders and forward head   LOWER EXTREMITY MMT:    MMT Right Eval Left Eval  Hip flexion 5 4+  Hip abduction 4+ 4+  Hip adduction 4+ 4+  Knee flexion 5 4+  Knee extension 5 4+  Ankle dorsiflexion 5 5  (Blank rows = not tested)   TRANSFERS: Assistive device utilized: Single point cane  Sit to stand: Modified independence Stand to sit: Modified independence Chair to chair: Modified independence   GAIT: Gait pattern: step through pattern, decreased arm swing- Right, decreased arm swing- Left, decreased stride length, and lateral lean- Left Distance walked: 96' Assistive device utilized: Single point cane Level of assistance: Modified independence with AD Comments: Pt able to ambulate fair with some abnormalities in his gait pattern.  FUNCTIONAL TESTS:  5 times sit to stand: 11.81 Timed up and go (TUG): 12.98 6 minute walk test: Not performed at evaluation 10 meter walk test: 0.84 m/s Berg Balance Scale: 47  PATIENT SURVEYS:  FOTO 58/58  TODAY'S TREATMENT: DATE: 09/02/22   TherEx:  Ambulation to cancer center and back to the gym, 1000' while wearing 5# AW for endurance training Seated LAQ with 5# AW donned, 2x15 Seated resisted marching with 5# AW on thigh, 2x15 STS, 2x15 Seated hamstring curls with BTB resistance applied, 2x15 each LE Leg press with B LE, 70#, 2x10      PATIENT EDUCATION: Education details: Pt educated on role of PT and services provided during current POC, along with prognosis and information about the clinic.  Person educated: Patient Education method: Explanation Education comprehension: verbalized understanding  HOME EXERCISE PROGRAM:  Access Code: [email or web address] URL: [email or web address] Date: 08/17/2022 Prepared by: [place] with Resistance  - 2 x daily - 7 x weekly - 2 sets - 10 reps - Sidelying Reverse Clamshell  with Resistance  - 2 x daily - 7 x weekly - 2 sets - 10 reps - Prone Hip Extension with Resistance Loop  - 2 x daily - 7 x weekly - 2 sets - 10 reps - Supine Bridge with Resistance Band  - 2 x daily - 7 x weekly - 2 sets - 10 reps - Side Stepping with Resistance at Thighs  - 2 x daily - 7 x weekly - 2 sets - 10 reps  GOALS:  Goals reviewed with patient? Yes  SHORT TERM GOALS: Target date: 08/31/2022  Pt will be independent with HEP in order to demonstrate increased ability to perform tasks related to occupation/hobbies. Baseline: Pt given HEP 08/13/22 and noted above Goal status: INITIAL   LONG TERM GOALS: Target date: 10/26/2022  1.  Patient (> 61 years old) will complete five times sit to stand test in < 10 seconds indicating an increased LE strength and improved balance. Baseline: 11.81 sec Goal status: INITIAL  2.  Patient will increase FOTO score to equal to or greater than 64 to demonstrate statistically significant improvement in mobility and quality of life.  Baseline: FOTO - 58 Goal status: INITIAL   3.  Patient will increase Berg Balance score by > 6 points to demonstrate decreased fall risk during functional activities. Baseline: 47 Goal status: INITIAL   4.  Patient will reduce timed up and go to <11 seconds to reduce fall risk and demonstrate improved transfer/gait ability. Baseline: 12.98 sec Goal status: INITIAL  5.  Patient will increase 10 meter walk test to >1.61ms as to improve gait speed for better community  ambulation and to reduce fall risk. Baseline: 11.95 sec/0.84 m/s Goal status: INITIAL  6.  Patient will increase six minute walk test distance to >1000 for progression to community ambulator and improve gait ability Baseline: 875 ft with SPC Goal status: INITIAL    ASSESSMENT:  CLINICAL IMPRESSION:  Pt performed well during the session and seems to be in much greater spirits following the phone call and scheduling of his oncologist appointment.  Pt  notes that he feels as though he is getting stronger, and is able to ambulate further distances without complications.  Therapy to continue as per POC and improve balance, strength, and endurance going forward.  Pt will continue to benefit from skilled therapy to address remaining deficits in order to improve overall QoL and return to PLOF.        OBJECTIVE IMPAIRMENTS: Abnormal gait, decreased activity tolerance, decreased balance, decreased endurance, decreased mobility, difficulty walking, decreased strength, and decreased safety awareness.   ACTIVITY LIMITATIONS: carrying, lifting, bending, sitting, squatting, and locomotion level  PARTICIPATION LIMITATIONS: community activity and yard work  PERSONAL FACTORS: Age, Fitness, Past/current experiences, Time since onset of injury/illness/exacerbation, and 3+ comorbidities: DM2, Psoriasis, Prostate Cancer, GI complications, SOB on exertion  are also affecting patient's functional outcome.   REHAB POTENTIAL: Fair pt has significant influx of comorbidities that will likely impact their potential progress with PT.  CLINICAL DECISION MAKING: Stable/uncomplicated  EVALUATION COMPLEXITY: Moderate  PLAN:  PT FREQUENCY: 2x/week  PT DURATION: 12 weeks  PLANNED INTERVENTIONS: Therapeutic exercises, Therapeutic activity, Neuromuscular re-education, Balance training, Gait training, Patient/Family education, Self Care, Joint mobilization, Joint manipulation, Stair training, Vestibular training, Canalith repositioning, Dry Needling, Electrical stimulation, Spinal manipulation, Spinal mobilization, Cryotherapy, Moist heat, and Manual therapy  PLAN FOR NEXT SESSION:   Continue to improve LE strengthening and balance training.   Gwenlyn Saran, PT, DPT Physical Therapist- [facility]  09/02/22, 5:30 PM

## 2022-09-03 ENCOUNTER — Institutional Professional Consult (permissible substitution): Payer: Medicare Other | Admitting: Radiation Oncology

## 2022-09-07 ENCOUNTER — Ambulatory Visit: Payer: Medicare Other | Admitting: Physical Therapy

## 2022-09-07 ENCOUNTER — Ambulatory Visit
Admission: EM | Admit: 2022-09-07 | Discharge: 2022-09-07 | Disposition: A | Discharge status: Home / Self Care | Payer: Medicare Other | Attending: Urgent Care | Admitting: Urgent Care

## 2022-09-07 ENCOUNTER — Ambulatory Visit (INDEPENDENT_AMBULATORY_CARE_PROVIDER_SITE_OTHER): Payer: Medicare Other

## 2022-09-07 DIAGNOSIS — J441 Chronic obstructive pulmonary disease with (acute) exacerbation: Secondary | ICD-10-CM

## 2022-09-07 DIAGNOSIS — R0602 Shortness of breath: Secondary | ICD-10-CM

## 2022-09-07 MED ORDER — LEVOFLOXACIN 750 MG PO TABS: 750.0000 mg | ORAL_TABLET | Freq: Every day | ORAL | 0 refills | Status: AC

## 2022-09-07 NOTE — ED Triage Notes (Signed)
Patient presents to UC for cough, SOB, and congestion since 2 days. Hx of COPD.

## 2022-09-07 NOTE — Discharge Instructions (Addendum)
Please go to the ED if his symptoms worsen or continue without improvement while using antibiotics.  Follow-up with your primary care provider.

## 2022-09-07 NOTE — ED Provider Notes (Signed)
Brian Callahan    CSN: [id number] Arrival date & time: 09/07/22  1503      History   Chief Complaint Chief Complaint  Patient presents with   Cough    Entered by patient   Shortness of Breath    HPI Brian Callahan is a 82 y.o. male.    Cough Associated symptoms: shortness of breath   Shortness of Breath Associated symptoms: cough     Presents to urgent care with concern for cough, shortness of breath, congestion x 2 days.  He is accompanied by his wife who states he was recently recovered from a respiratory infection.  She is concerned for recurrence.  PMH includes COPD, DM 2.  Past Medical History:  Diagnosis Date   Anemia    pt denies 01/18/20   BP (high blood pressure) 05/23/2015   BPH with obstruction/lower urinary tract symptoms    COPD (chronic obstructive pulmonary disease) ([place])    Diabetes mellitus without complication ([place])    diet controlled   Dyspnea    Encounter for long-term (current) use of other high-risk medications    GERD (gastroesophageal reflux disease)    gastritis   GI bleed    Gout    H/O hernia repair mid-90s   x2   History of elevated PSA    History of Rocky [place] spotted fever    HLD (hyperlipidemia)    HTN (hypertension)    Hyperglycemia    Hyperuricemia    Incomplete bladder emptying    Macular degeneration    Obesity    Psoriasis    Psoriasis    Urinary frequency     Patient Active Problem List   Diagnosis Date Noted   Diabetes mellitus type 2 with complications ([place]) 0000000   Chest pain with moderate risk for cardiac etiology 11/29/2017   Positive cardiac stress test 11/29/2017   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 11/08/2017   Esophagitis, reflux 11/08/2017   SOB (shortness of breath) on exertion 07/02/2017   BPH with obstruction/lower urinary tract symptoms 11/21/2015   History of elevated PSA 11/21/2015   H/O gastric ulcer 09/05/2015   Disorder of eyeball 05/23/2015   H/O infectious disease  05/23/2015   Blood glucose elevated 05/23/2015   BP (high blood pressure) 05/23/2015   HLD (hyperlipidemia) 05/23/2015   Psoriasis 05/23/2015   Absolute anemia 01/02/2014   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   Elevated prostate specific antigen (PSA) 01/02/2014   Elevated blood uric acid level 01/02/2014   Enlarged prostate without lower urinary tract symptoms (luts) 01/02/2014   Bleeding gastrointestinal 04/15/2012    Past Surgical History:  Procedure Laterality Date   APPENDECTOMY     BACK SURGERY     neck and spine 2007   CERVICAL SPINE SURGERY  2007   CHOLECYSTECTOMY N/A 10/18/2017   Procedure: LAPAROSCOPIC CHOLECYSTECTOMY;  Surgeon: [name], MD;  Location: [facility];  Service: General;  Laterality: N/A;   COLECTOMY  1998   partial, due to obstruction   COLONOSCOPY     lost 10 units of blood   COLONOSCOPY WITH PROPOFOL N/A 09/27/2019   Procedure: COLONOSCOPY WITH PROPOFOL;  Surgeon: [name], [name], MD;  Location: [facility];  Service: Gastroenterology;  Laterality: N/A;   CYSTOSCOPY WITH INSERTION OF UROLIFT N/A 01/23/2020   Procedure: CYSTOSCOPY WITH INSERTION OF UROLIFT;  Surgeon: [name], MD;  Location: [facility];  Service: Urology;  Laterality: N/A;   ESOPHAGOGASTRODUODENOSCOPY N/A 10/14/2016   Procedure: ESOPHAGOGASTRODUODENOSCOPY (EGD);  Surgeon: Herbie Baltimore  Federico Flake, MD;  Location: [facility];  Service: Endoscopy;  Laterality: N/A;   ESOPHAGOGASTRODUODENOSCOPY (EGD) WITH PROPOFOL N/A 09/27/2019   Procedure: ESOPHAGOGASTRODUODENOSCOPY (EGD) WITH PROPOFOL;  Surgeon: [name], [name], MD;  Location: [facility];  Service: Gastroenterology;  Laterality: N/A;   EYE SURGERY     bilateral cataracts   HERNIA REPAIR  mid-90s   x2  inguinal   PROSTATE BIOPSY  2013   SURGERY SCROTAL / TESTICULAR     for fibrous growth       Home Medications    Prior to Admission medications   Medication Sig Start Date End Date Taking? Authorizing Provider   acetaminophen (TYLENOL) 500 MG tablet Take 500-1,000 mg by mouth every 6 (six) hours as needed (for headache/minor pain.).    [provider]  clobetasol ointment (TEMOVATE) [id number] % Apply 1 application topically daily as needed (psoriasis).  02/01/19   [provider]  cyanocobalamin (VITAMIN B12) 1000 MCG tablet Take 1,000 mcg by mouth daily. 06/11/22 06/11/23  [provider]  enalapril (VASOTEC) 5 MG tablet Take 1 tablet (5 mg total) by mouth 2 (two) times daily. 11/06/19   Minna Merritts, MD  ezetimibe (ZETIA) 10 MG tablet Take 10 mg by mouth daily.    [provider]  Melatonin 10 MG TABS Take 10 mg by mouth at bedtime as needed (sleeo).    [provider]  metFORMIN (GLUCOPHAGE) 500 MG tablet Take 1 tablet by mouth 2 (two) times daily. 11/07/20   [provider]  metoprolol succinate (TOPROL-XL) 50 MG 24 hr tablet Take 1.5 tablets by mouth daily. 09/05/20   [provider]  Misc Natural Products (TART CHERRY ADVANCED PO) Take 12 mg by mouth daily.     [provider]  tamsulosin (FLOMAX) 0.4 MG CAPS capsule TAKE 1 CAPSULE BY MOUTH TWICE  DAILY 01/13/22   McGowan, Larene [place] A, PA-C  tildrakizumab-asmn (ILUMYA) 100 MG/ML subcutaneous injection Inject 100 mg into the skin once.    [provider]    Family History Family History  Problem Relation Age of Onset   Kidney failure Brother    Prostate cancer Neg Hx     Social History Social History   Tobacco Use   Smoking status: Former    Packs/day: 0.25    Years: 10.00    Total pack years: 2.50    Types: Cigarettes    Quit date: 10/12/1990    Years since quitting: 31.9    Passive exposure: Past   Smokeless tobacco: Never   Tobacco comments:    Quit 20 years ago  Vaping Use   Vaping Use: Never used  Substance Use Topics   Alcohol use: Yes    Alcohol/week: 18.0 standard drinks of alcohol    Types: 18 Cans of beer per week    Comment: occassional   Drug  use: No     Allergies   Isosorbide nitrate, Shellfish allergy, and Trazodone and nefazodone   Review of Systems Review of Systems  Respiratory:  Positive for cough and shortness of breath.      Physical Exam Triage Vital Signs ED Triage Vitals  Enc Vitals Group     BP      Pulse      Resp      Temp      Temp src      SpO2      Weight      Height      Head Circumference  Peak Flow      Pain Score      Pain Loc      Pain Edu?      Excl. in [place]?    No data found.  Updated Vital Signs There were no vitals taken for this visit.  Visual Acuity Right Eye Distance:   Left Eye Distance:   Bilateral Distance:    Right Eye Near:   Left Eye Near:    Bilateral Near:     Physical Exam Vitals reviewed.  Constitutional:      Appearance: He is well-developed. He is ill-appearing and toxic-appearing.  Cardiovascular:     Rate and Rhythm: Normal rate and regular rhythm.  Pulmonary:     Effort: Pulmonary effort is normal.     Breath sounds: Normal breath sounds. No wheezing, rhonchi or rales.  Skin:    General: Skin is warm and dry.  Neurological:     General: No focal deficit present.     Mental Status: He is alert.  Psychiatric:        Mood and Affect: Mood normal.        Behavior: Behavior normal.      UC Treatments / Results  Labs (all labs ordered are listed, but only abnormal results are displayed) Labs Reviewed - No data to display  EKG   Radiology No results found.  Procedures Procedures (including critical care time)  Medications Ordered in UC Medications - No data to display  Initial Impression / Assessment and Plan / UC Course  I have reviewed the triage vital signs and the nursing notes.  Pertinent labs & imaging results that were available during my care of the patient were reviewed by me and considered in my medical decision making (see chart for details).   Patient is afebrile here without recent antipyretics. Satting 92% on room  air. Overall is ill appearing, in some distress. Pulmonary exam is remarkable for decreased breath sounds.  Lungs CTAB without wheezing, rhonchi, rales.  Chest xray ordered.  Given the patient's severe symptoms and history of COPD, will start him on levofloxacin.  Discussed return for emergent evaluation if symptoms worsen or continue without any improvement.  Asked his wife to monitor his SaO2 at home.  Patient is discharged with levofloxacin 750 mg for exacerbation of COPD while awaiting results of chest x-ray.  Final Clinical Impressions(s) / UC Diagnoses   Final diagnoses:  None   Discharge Instructions   None    ED Prescriptions   None    PDMP not reviewed this encounter.   Rose Phi, [place] 09/07/22 1713

## 2022-09-07 NOTE — Therapy (Incomplete)
OUTPATIENT PHYSICAL THERAPY NEURO TREATMENT   Patient Name: Brian Callahan MRN: [id number] DOB:Dec 01, 1940, 82 y.o., male Today's Date: 09/07/2022   PCP: [name], MD REFERRING PROVIDER: [name], MD  END OF SESSION:        Past Medical History:  Diagnosis Date   Anemia    pt denies 01/18/20   BP (high blood pressure) 05/23/2015   BPH with obstruction/lower urinary tract symptoms    COPD (chronic obstructive pulmonary disease) ([place])    Diabetes mellitus without complication ([place])    diet controlled   Dyspnea    Encounter for long-term (current) use of other high-risk medications    GERD (gastroesophageal reflux disease)    gastritis   GI bleed    Gout    H/O hernia repair mid-90s   x2   History of elevated PSA    History of Rocky Mountain spotted fever    HLD (hyperlipidemia)    HTN (hypertension)    Hyperglycemia    Hyperuricemia    Incomplete bladder emptying    Macular degeneration    Obesity    Psoriasis    Psoriasis    Urinary frequency    Past Surgical History:  Procedure Laterality Date   APPENDECTOMY     BACK SURGERY     neck and spine 2007   CERVICAL SPINE SURGERY  2007   CHOLECYSTECTOMY N/A 10/18/2017   Procedure: LAPAROSCOPIC CHOLECYSTECTOMY;  Surgeon: [name], MD;  Location: [facility];  Service: General;  Laterality: N/A;   COLECTOMY  1998   partial, due to obstruction   COLONOSCOPY     lost 10 units of blood   COLONOSCOPY WITH PROPOFOL N/A 09/27/2019   Procedure: COLONOSCOPY WITH PROPOFOL;  Surgeon: [name], [name], MD;  Location: [facility];  Service: Gastroenterology;  Laterality: N/A;   CYSTOSCOPY WITH INSERTION OF UROLIFT N/A 01/23/2020   Procedure: CYSTOSCOPY WITH INSERTION OF UROLIFT;  Surgeon: [name], MD;  Location: [facility];  Service: Urology;  Laterality: N/A;   ESOPHAGOGASTRODUODENOSCOPY N/A 10/14/2016   Procedure: ESOPHAGOGASTRODUODENOSCOPY (EGD);  Surgeon: [name], MD;  Location: [facility]  ENDOSCOPY;  Service: Endoscopy;  Laterality: N/A;   ESOPHAGOGASTRODUODENOSCOPY (EGD) WITH PROPOFOL N/A 09/27/2019   Procedure: ESOPHAGOGASTRODUODENOSCOPY (EGD) WITH PROPOFOL;  Surgeon: [name], [name], MD;  Location: [facility] END[facility];  Service: Gastroenterology;  Laterality: N/A;   EYE SURGERY     bilateral cataracts   HERNIA REPAIR  mid-90s   x2  inguinal   PROSTATE BIOPSY  2013   SURGERY SCROTAL / TESTICULAR     for fibrous growth   Patient Active Problem List   Diagnosis Date Noted   Diabetes mellitus type 2 with complications ([place]) 0000000   Chest pain with moderate risk for cardiac etiology 11/29/2017   Positive cardiac stress test 11/29/2017   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 11/08/2017   Esophagitis, reflux 11/08/2017   SOB (shortness of breath) on exertion 07/02/2017   BPH with obstruction/lower urinary tract symptoms 11/21/2015   History of elevated PSA 11/21/2015   H/O gastric ulcer 09/05/2015   Disorder of eyeball 05/23/2015   H/O infectious disease 05/23/2015   Blood glucose elevated 05/23/2015   BP (high blood pressure) 05/23/2015   HLD (hyperlipidemia) 05/23/2015   Psoriasis 05/23/2015   Absolute anemia 01/02/2014   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  Rationale for Evaluation and Treatment: Rehabilitation   SUBJECTIVE:                                                                                                                                                                                             SUBJECTIVE STATEMENT:  Pt reports he has rescheduled his oncologist appointment  for next week but is looking forward to being able to speak to them.     Pt accompanied by: self  PERTINENT HISTORY:   Pt is 82 y.o. male who has experienced a decline in his overall functional mobility and stability since Covid and being unable to go back to the gym.  Pt notes that he has noticed it being increasingly difficult performing tasks without losing his balance and just feels generalized weakness.  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    PAIN: Are you having pain? No  PRECAUTIONS: None  WEIGHT BEARING RESTRICTIONS: No  FALLS: Has patient fallen in last 6 months? No  LIVING ENVIRONMENT: Lives with: lives with their spouse Lives in: House/apartment Stairs: Yes: External: 4 steps; on right going up Has following equipment at home: Single point cane, Walker - 2 wheeled, and Grab bars  PLOF: Independent; pt does state he has difficulty tying shoes, but he has slip on shoes that assist with that.  PATIENT GOALS: Pt wants to be more mobile and be able to exercise.  OBJECTIVE:   DIAGNOSTIC FINDINGS:   EXAM 05/29/22: MRI HEAD WITHOUT CONTRAST  IMPRESSION: 1. No acute intracranial abnormality. 2. 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 3. Small chronic right cerebellar infarct.  COGNITION: Overall cognitive status: Within functional limits for tasks assessed   SENSATION: WFL  COORDINATION: WFL  POSTURE: rounded shoulders and forward head   LOWER EXTREMITY MMT:    MMT Right Eval Left Eval  Hip flexion 5 4+  Hip abduction 4+ 4+  Hip adduction 4+ 4+  Knee flexion 5 4+  Knee extension 5 4+  Ankle dorsiflexion 5 5  (Blank rows = not tested)   TRANSFERS: Assistive device utilized: Single point cane  Sit to stand: Modified independence Stand to sit: Modified independence Chair  to chair: Modified independence   GAIT: Gait pattern: step through pattern, decreased arm  swing- Right, decreased arm swing- Left, decreased stride length, and lateral lean- Left Distance walked: 66' Assistive device utilized: Single point cane Level of assistance: Modified independence with AD Comments: Pt able to ambulate fair with some abnormalities in his gait pattern.  FUNCTIONAL TESTS:  5 times sit to stand: 11.81 Timed up and go (TUG): 12.98 6 minute walk test: Not performed at evaluation 10 meter walk test: 0.84 m/s Berg Balance Scale: 47  PATIENT SURVEYS:  FOTO 58/58  TODAY'S TREATMENT: DATE: 09/07/22   TherEx:  Ambulation to cancer center and back to the gym, 1000' while wearing 5# AW for endurance training Seated LAQ with 5# AW donned, 2x15 Seated resisted marching with 5# AW on thigh, 2x15 STS, 2x15 Seated hamstring curls with BTB resistance applied, 2x15 each LE Leg press with B LE, 70#, 2x10      PATIENT EDUCATION: Education details: Pt educated on role of PT and services provided during current POC, along with prognosis and information about the clinic.  Person educated: Patient Education method: Explanation Education comprehension: verbalized understanding  HOME EXERCISE PROGRAM:  Access Code: [email or web address] URL: [email or web address] Date: 08/17/2022 Prepared by: [place] with Resistance  - 2 x daily - 7 x weekly - 2 sets - 10 reps - Sidelying Reverse Clamshell with Resistance  - 2 x daily - 7 x weekly - 2 sets - 10 reps - Prone Hip Extension with Resistance Loop  - 2 x daily - 7 x weekly - 2 sets - 10 reps - Supine Bridge with Resistance Band  - 2 x daily - 7 x weekly - 2 sets - 10 reps - Side Stepping with Resistance at Thighs  - 2 x daily - 7 x weekly - 2 sets - 10 reps  GOALS:  Goals reviewed with patient? Yes  SHORT TERM GOALS: Target date: 08/31/2022  Pt will be independent with HEP in order to  demonstrate increased ability to perform tasks related to occupation/hobbies. Baseline: Pt given HEP 08/13/22 and noted above Goal status: INITIAL   LONG TERM GOALS: Target date: 10/26/2022  1.  Patient (> 95 years old) will complete five times sit to stand test in < 10 seconds indicating an increased LE strength and improved balance. Baseline: 11.81 sec Goal status: INITIAL  2.  Patient will increase FOTO score to equal to or greater than 64 to demonstrate statistically significant improvement in mobility and quality of life.  Baseline: FOTO - 58 Goal status: INITIAL   3.  Patient will increase Berg Balance score by > 6 points to demonstrate decreased fall risk during functional activities. Baseline: 47 Goal status: INITIAL   4.  Patient will reduce timed up and go to <11 seconds to reduce fall risk and demonstrate improved transfer/gait ability. Baseline: 12.98 sec Goal status: INITIAL  5.  Patient will increase 10 meter walk test to >1.75ms as to improve gait speed for better community ambulation and to reduce fall risk. Baseline: 11.95 sec/0.84 m/s Goal status: INITIAL  6.  Patient will increase six minute walk test distance to >1000 for progression to community ambulator and improve gait ability Baseline: 875 ft with SPC Goal status: INITIAL    ASSESSMENT:  CLINICAL IMPRESSION:  Pt performed well during the session and seems to be in much greater spirits following the phone call and scheduling of his oncologist appointment.  Pt notes that he feels as though he is getting stronger, and is able to ambulate further distances without complications.  Therapy to continue as per POC and improve balance, strength, and endurance going forward.  Pt will continue to benefit from skilled therapy to address remaining deficits in order to improve overall QoL and return to PLOF.        OBJECTIVE IMPAIRMENTS: Abnormal gait, decreased activity tolerance, decreased balance, decreased  endurance, decreased mobility, difficulty walking, decreased strength, and decreased safety awareness.   ACTIVITY LIMITATIONS: carrying, lifting, bending, sitting, squatting, and locomotion level  PARTICIPATION LIMITATIONS: community activity and yard work  PERSONAL FACTORS: Age, Fitness, Past/current experiences, Time since onset of injury/illness/exacerbation, and 3+ comorbidities: DM2, Psoriasis, Prostate Cancer, GI complications, SOB on exertion  are also affecting patient's functional outcome.   REHAB POTENTIAL: Fair pt has significant influx of comorbidities that will likely impact their potential progress with PT.  CLINICAL DECISION MAKING: Stable/uncomplicated  EVALUATION COMPLEXITY: Moderate  PLAN:  PT FREQUENCY: 2x/week  PT DURATION: 12 weeks  PLANNED INTERVENTIONS: Therapeutic exercises, Therapeutic activity, Neuromuscular re-education, Balance training, Gait training, Patient/Family education, Self Care, Joint mobilization, Joint manipulation, Stair training, Vestibular training, Canalith repositioning, Dry Needling, Electrical stimulation, Spinal manipulation, Spinal mobilization, Cryotherapy, Moist heat, and Manual therapy  PLAN FOR NEXT SESSION:   Continue to improve LE strengthening and balance training.  Particia Lather PT ,DPT Physical Therapist- [facility]   09/07/22, 10:35 AM

## 2022-09-09 ENCOUNTER — Ambulatory Visit: Payer: Medicare Other

## 2022-09-10 ENCOUNTER — Ambulatory Visit
Admission: RE | Admit: 2022-09-10 | Discharge: 2022-09-10 | Disposition: A | Discharge status: Home / Self Care | Payer: Medicare Other | Source: Ambulatory Visit | Attending: Radiation Oncology | Admitting: Radiation Oncology

## 2022-09-10 ENCOUNTER — Encounter: Payer: Self-pay | Admitting: Radiation Oncology

## 2022-09-10 VITALS — BP 158/79 | HR 80 | Temp 97.0°F | Resp 16 | Ht 66.0 in | Wt 170.0 lb

## 2022-09-10 DIAGNOSIS — J449 Chronic obstructive pulmonary disease, unspecified: Secondary | ICD-10-CM | POA: Diagnosis not present

## 2022-09-10 DIAGNOSIS — C61 Malignant neoplasm of prostate: Secondary | ICD-10-CM | POA: Diagnosis not present

## 2022-09-10 DIAGNOSIS — I10 Essential (primary) hypertension: Secondary | ICD-10-CM | POA: Diagnosis not present

## 2022-09-10 DIAGNOSIS — Z7984 Long term (current) use of oral hypoglycemic drugs: Secondary | ICD-10-CM | POA: Diagnosis not present

## 2022-09-10 DIAGNOSIS — R351 Nocturia: Secondary | ICD-10-CM | POA: Insufficient documentation

## 2022-09-10 DIAGNOSIS — E669 Obesity, unspecified: Secondary | ICD-10-CM | POA: Insufficient documentation

## 2022-09-10 DIAGNOSIS — Z79899 Other long term (current) drug therapy: Secondary | ICD-10-CM | POA: Insufficient documentation

## 2022-09-10 DIAGNOSIS — K219 Gastro-esophageal reflux disease without esophagitis: Secondary | ICD-10-CM | POA: Diagnosis not present

## 2022-09-10 DIAGNOSIS — Z87891 Personal history of nicotine dependence: Secondary | ICD-10-CM | POA: Insufficient documentation

## 2022-09-10 DIAGNOSIS — E785 Hyperlipidemia, unspecified: Secondary | ICD-10-CM | POA: Diagnosis not present

## 2022-09-10 NOTE — Consult Note (Signed)
NEW PATIENT EVALUATION  Name: Brian Callahan  MRN: [id number]  Date:   09/10/2022     DOB: Dec 04, 1940   This 82 y.o. 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  REFERRING PHYSICIAN: Idelle Crouch, MD  CHIEF COMPLAINT:  Chief Complaint  Patient presents with   Prostate Cancer    DIAGNOSIS: The encounter diagnosis was Malignant neoplasm of prostate ([place]).   PREVIOUS INVESTIGATIONS:  MRI and PSMA PET scan reviewed Pathology reports reviewed Clinical notes reviewed  HPI: Patient is a 82 year old male who presented with a rising PSA up to 5.7.  MRI of his prostate demonstrated showed PI-RADS category 5 lesion of the right anterior peripheral zone.  No evidence of bone metastasis or pelvic adenopathy noted.  Targeted biopsy was positive for 1 core of Gleason 7 (4+3).  Patient underwent a PSMA PET scan showing intense activity in the right prostate gland apex consistent with primary prostate adenocarcinoma.  No evidence of metastatic adenopathy or evidence of skeletal metastasis was noted.  Patient is fairly asymptomatic has nocturia x 2-3 no bone pain.  He is now referred to radiation collagen for consideration of treatment.  PLANNED TREATMENT REGIMEN: Image guided IMRT radiation therapy  PAST MEDICAL HISTORY:  has a past medical history of Anemia, BP (high blood pressure) (05/23/2015), BPH with obstruction/lower urinary tract symptoms, COPD (chronic obstructive pulmonary disease) ([place]), Diabetes mellitus without complication ([place]), Dyspnea, Encounter for long-term (current) use of other high-risk medications, GERD (gastroesophageal reflux disease), GI bleed, Gout, H/O hernia repair (mid-90s), History of elevated PSA, History of Rocky Mountain spotted fever, HLD (hyperlipidemia), HTN (hypertension), Hyperglycemia, Hyperuricemia, Incomplete bladder emptying,  Macular degeneration, Obesity, Psoriasis, Psoriasis, and Urinary frequency.    PAST SURGICAL HISTORY:  Past Surgical History:  Procedure Laterality Date   APPENDECTOMY     BACK SURGERY     neck and spine 2007   CERVICAL SPINE SURGERY  2007   CHOLECYSTECTOMY N/A 10/18/2017   Procedure: LAPAROSCOPIC CHOLECYSTECTOMY;  Surgeon: [name], MD;  Location: [facility] ORS;  Service: General;  Laterality: N/A;   COLECTOMY  1998   partial, due to obstruction   COLONOSCOPY     lost 10 units of blood   COLONOSCOPY WITH PROPOFOL N/A 09/27/2019   Procedure: COLONOSCOPY WITH PROPOFOL;  Surgeon: [name], [name], MD;  Location: [facility] ENDOSCOPY;  Service: Gastroenterology;  Laterality: N/A;   CYSTOSCOPY WITH INSERTION OF UROLIFT N/A 01/23/2020   Procedure: CYSTOSCOPY WITH INSERTION OF UROLIFT;  Surgeon: [name], MD;  Location: [facility] ORS;  Service: Urology;  Laterality: N/A;   ESOPHAGOGASTRODUODENOSCOPY N/A 10/14/2016   Procedure: ESOPHAGOGASTRODUODENOSCOPY (EGD);  Surgeon: [name], MD;  Location: [facility] ENDOSCOPY;  Service: Endoscopy;  Laterality: N/A;   ESOPHAGOGASTRODUODENOSCOPY (EGD) WITH PROPOFOL N/A 09/27/2019   Procedure: ESOPHAGOGASTRODUODENOSCOPY (EGD) WITH PROPOFOL;  Surgeon: [name], [name], MD;  Location: [facility] ENDOSCOPY;  Service: Gastroenterology;  Laterality: N/A;   EYE SURGERY     bilateral cataracts   HERNIA REPAIR  mid-90s   x2  inguinal   PROSTATE BIOPSY  2013   SURGERY SCROTAL / TESTICULAR     for fibrous growth    FAMILY HISTORY: family history includes Kidney failure in his brother.  SOCIAL HISTORY:  reports that he quit smoking about 31 years ago. His smoking use included cigarettes. He has a 2.50 pack-year smoking history. He has been exposed to tobacco  smoke. He has never used smokeless tobacco. He reports current alcohol use of about 18.0 standard drinks of alcohol per week. He reports that he does not use drugs.  ALLERGIES: Isosorbide nitrate, Shellfish  allergy, and Trazodone and nefazodone  MEDICATIONS:  Current Outpatient Medications  Medication Sig Dispense Refill   acetaminophen (TYLENOL) 500 MG tablet Take 500-1,000 mg by mouth every 6 (six) hours as needed (for headache/minor pain.).     clobetasol ointment (TEMOVATE) [id number] % Apply 1 application topically daily as needed (psoriasis).      cyanocobalamin (VITAMIN B12) 1000 MCG tablet Take 1,000 mcg by mouth daily.     enalapril (VASOTEC) 5 MG tablet Take 1 tablet (5 mg total) by mouth 2 (two) times daily.     ezetimibe (ZETIA) 10 MG tablet Take 10 mg by mouth daily.     levofloxacin (LEVAQUIN) 750 MG tablet Take 1 tablet (750 mg total) by mouth daily for 7 days. 7 tablet 0   LORazepam (ATIVAN) 0.5 MG tablet Take 0.5 mg by mouth at bedtime as needed.     Melatonin 10 MG TABS Take 10 mg by mouth at bedtime as needed (sleeo).     metFORMIN (GLUCOPHAGE) 500 MG tablet Take 1 tablet by mouth 2 (two) times daily.     metoprolol succinate (TOPROL-XL) 50 MG 24 hr tablet Take 1.5 tablets by mouth daily.     Misc Natural Products (TART CHERRY ADVANCED PO) Take 12 mg by mouth daily.      tamsulosin (FLOMAX) 0.4 MG CAPS capsule TAKE 1 CAPSULE BY MOUTH TWICE  DAILY 180 capsule 3   tildrakizumab-asmn (ILUMYA) 100 MG/ML subcutaneous injection Inject 100 mg into the skin once.     No current facility-administered medications for this encounter.    ECOG PERFORMANCE STATUS:  0 - Asymptomatic  REVIEW OF SYSTEMS: Patient denies any weight loss, fatigue, weakness, fever, chills or night sweats. Patient denies any loss of vision, blurred vision. Patient denies any ringing  of the ears or hearing loss. No irregular heartbeat. Patient denies heart murmur or history of fainting. Patient denies any chest pain or pain radiating to her upper extremities. Patient denies any shortness of breath, difficulty breathing at night, cough or hemoptysis. Patient denies any swelling in the lower legs. Patient denies any  nausea vomiting, vomiting of blood, or coffee ground material in the vomitus. Patient denies any stomach pain. Patient states has had normal bowel movements no significant constipation or diarrhea. Patient denies any dysuria, hematuria or significant nocturia. Patient denies any problems walking, swelling in the joints or loss of balance. Patient denies any skin changes, loss of hair or loss of weight. Patient denies any excessive worrying or anxiety or significant depression. Patient denies any problems with insomnia. Patient denies excessive thirst, polyuria, polydipsia. Patient denies any swollen glands, patient denies easy bruising or easy bleeding. Patient denies any recent infections, allergies or URI. Patient "s visual fields have not changed significantly in recent time.   PHYSICAL EXAM: BP (!) 158/79 (BP Location: Right Arm, Patient Position: Sitting, Cuff Size: Normal)   Pulse 80   Temp (!) 97 F (36.1 C) (Tympanic)   Resp 16   Ht 5' 6"$  (1.676 m)   Wt 170 lb (77.1 kg)   BMI 27.44 kg/m  Well-developed well-nourished patient in NAD. HEENT reveals PERLA, EOMI, discs not visualized.  Oral cavity is clear. No oral mucosal lesions are identified. Neck is clear without evidence of cervical or supraclavicular adenopathy. Lungs are clear to  A&P. Cardiac examination is essentially unremarkable with regular rate and rhythm without murmur rub or thrill. Abdomen is benign with no organomegaly or masses noted. Motor sensory and DTR levels are equal and symmetric in the upper and lower extremities. Cranial nerves II through XII are grossly intact. Proprioception is intact. No peripheral adenopathy or edema is identified. No motor or sensory levels are noted. Crude visual fields are within normal range.  LABORATORY DATA: Pathology reports reviewed    RADIOLOGY RESULTS: MRI scan and PSMA PET scan reviewed compatible with above-stated findings   IMPRESSION: Stage IIc Gleason 7 (4+3) adenocarcinoma  prostate with a PSA of 76.67 in a 82 year old male  PLAN: At this time I have recommended image guided IMRT radiation therapy to his prostate.  Will treat the area of hypermetabolic activity his prostate gland 83 Gray the rest of the gland will receive 2 Pearline Cables and I have asked Dr. Bernardo Heater to place fiducial markers in his prostate for daily image guided treatment.  We use PET fusion study for treatment planning.  I also would like him to receive 1 dose of Eligard 68-monthdepot and that will be arranged for Dr. Dr. SDene Gentryoffice also.  Risks and benefits of treatment eluding increased lower urinary tract symptoms diarrhea fatigue alteration of blood counts all were reviewed in detail with the patient and his wife.  Simulation will be arranged after markers are placed.  I would like to take this opportunity to thank you for allowing me to participate in the care of your patient..Noreene Filbert MD

## 2022-09-14 ENCOUNTER — Ambulatory Visit: Payer: Medicare Other

## 2022-09-14 DIAGNOSIS — R262 Difficulty in walking, not elsewhere classified: Secondary | ICD-10-CM | POA: Diagnosis not present

## 2022-09-14 DIAGNOSIS — M6281 Muscle weakness (generalized): Secondary | ICD-10-CM

## 2022-09-14 NOTE — Therapy (Signed)
OUTPATIENT PHYSICAL THERAPY NEURO TREATMENT   Patient Name: Brian Callahan MRN: [id number] DOB:Apr 04, 1941, 82 y.o., male Today's Date: 09/14/2022   PCP: [name], MD REFERRING PROVIDER: [name], MD  END OF SESSION:   PT End of Session - 09/14/22 1152     Visit Number 9    Number of Visits 24    Date for PT Re-Evaluation 10/26/22    PT Start Time 1148    PT Stop Time 6    PT Time Calculation (min) 42 min    Equipment Utilized During Treatment Gait belt    Activity Tolerance Patient tolerated treatment well    Behavior During Therapy WFL for tasks assessed/performed                Past Medical History:  Diagnosis Date   Anemia    pt denies 01/18/20   BP (high blood pressure) 05/23/2015   BPH with obstruction/lower urinary tract symptoms    COPD (chronic obstructive pulmonary disease) ([place])    Diabetes mellitus without complication ([place])    diet controlled   Dyspnea    Encounter for long-term (current) use of other high-risk medications    GERD (gastroesophageal reflux disease)    gastritis   GI bleed    Gout    H/O hernia repair mid-90s   x2   History of elevated PSA    History of Rocky Mountain spotted fever    HLD (hyperlipidemia)    HTN (hypertension)    Hyperglycemia    Hyperuricemia    Incomplete bladder emptying    Macular degeneration    Obesity    Psoriasis    Psoriasis    Urinary frequency    Past Surgical History:  Procedure Laterality Date   APPENDECTOMY     BACK SURGERY     neck and spine 2007   CERVICAL SPINE SURGERY  2007   CHOLECYSTECTOMY N/A 10/18/2017   Procedure: LAPAROSCOPIC CHOLECYSTECTOMY;  Surgeon: [name], MD;  Location: [facility] ORS;  Service: General;  Laterality: N/A;   COLECTOMY  1998   partial, due to obstruction   COLONOSCOPY     lost 10 units of blood   COLONOSCOPY WITH PROPOFOL N/A 09/27/2019   Procedure: COLONOSCOPY WITH PROPOFOL;  Surgeon: [name], [name], MD;  Location: [facility] ENDOSCOPY;   Service: Gastroenterology;  Laterality: N/A;   CYSTOSCOPY WITH INSERTION OF UROLIFT N/A 01/23/2020   Procedure: CYSTOSCOPY WITH INSERTION OF UROLIFT;  Surgeon: [name], MD;  Location: [facility] ORS;  Service: Urology;  Laterality: N/A;   ESOPHAGOGASTRODUODENOSCOPY N/A 10/14/2016   Procedure: ESOPHAGOGASTRODUODENOSCOPY (EGD);  Surgeon: [name], MD;  Location: [facility] ENDOSCOPY;  Service: Endoscopy;  Laterality: N/A;   ESOPHAGOGASTRODUODENOSCOPY (EGD) WITH PROPOFOL N/A 09/27/2019   Procedure: ESOPHAGOGASTRODUODENOSCOPY (EGD) WITH PROPOFOL;  Surgeon: [name], [name], MD;  Location: [facility] ENDOSCOPY;  Service: Gastroenterology;  Laterality: N/A;   EYE SURGERY     bilateral cataracts   HERNIA REPAIR  mid-90s   x2  inguinal   PROSTATE BIOPSY  2013   SURGERY SCROTAL / TESTICULAR     for fibrous growth   Patient Active Problem List   Diagnosis Date Noted   Diabetes mellitus type 2 with complications ([place]) 0000000   Chest pain with moderate risk for cardiac etiology 11/29/2017   Positive cardiac stress test 11/29/2017   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 11/08/2017   Esophagitis, reflux 11/08/2017   SOB (shortness of breath) on exertion 07/02/2017   BPH with obstruction/lower urinary tract symptoms  11/21/2015   History of elevated PSA 11/21/2015   H/O gastric ulcer 09/05/2015   Disorder of eyeball 05/23/2015   H/O infectious disease 05/23/2015   Blood glucose elevated 05/23/2015   BP (high blood pressure) 05/23/2015   HLD (hyperlipidemia) 05/23/2015   Psoriasis 05/23/2015   Absolute anemia 01/02/2014   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   Elevated prostate specific antigen (PSA) 01/02/2014   Elevated blood uric acid level 01/02/2014   Enlarged prostate without lower urinary tract symptoms (luts) 01/02/2014   Bleeding gastrointestinal 04/15/2012    ONSET DATE: ~10 years  REFERRING DIAG:  R26.89 (ICD-10-CM) - Balance problem  R26.9 (ICD-10-CM) - Gait abnormality     THERAPY DIAG:  Difficulty in walking, not elsewhere classified  Muscle weakness (generalized)  Rationale for Evaluation and Treatment: Rehabilitation   SUBJECTIVE:                                                                                                                                                                                             SUBJECTIVE STATEMENT:  Pt reports that he went to see his oncologist and had a good meeting with them.  He has been given his treatment options and is just waiting on his urologist to schedule a visit to identify his markers.  Pt also has not been to therapy due to being sick the past several weeks.  Pt still not 100% he claims, but is ready to start back to therapy.   Pt accompanied by: self  PERTINENT HISTORY:   Pt is 82 y.o. male who has experienced a decline in his overall functional mobility and stability since Covid and being unable to go back to the gym.  Pt notes that he has noticed it being increasingly difficult performing tasks without losing his balance and just feels generalized weakness.  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    PAIN: Are you having pain? No  PRECAUTIONS: None  WEIGHT BEARING RESTRICTIONS: No  FALLS: Has patient fallen in last 6 months? No  LIVING ENVIRONMENT: Lives with: lives with their spouse Lives in: House/apartment Stairs: Yes: External: 4 steps; on right going up Has following equipment at home: Single point cane, Walker - 2 wheeled, and Grab bars  PLOF: Independent; pt does state he has difficulty tying shoes, but he has slip on shoes that assist with that.  PATIENT GOALS: Pt wants to be more mobile and be able to exercise.  OBJECTIVE:  DIAGNOSTIC FINDINGS:   EXAM 05/29/22: MRI HEAD WITHOUT CONTRAST  IMPRESSION: 1. No acute  intracranial abnormality. 2. 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 3. Small chronic right cerebellar infarct.  COGNITION: Overall cognitive status: Within functional limits for tasks assessed   SENSATION: WFL  COORDINATION: WFL  POSTURE: rounded shoulders and forward head   LOWER EXTREMITY MMT:    MMT Right Eval Left Eval  Hip flexion 5 4+  Hip abduction 4+ 4+  Hip adduction 4+ 4+  Knee flexion 5 4+  Knee extension 5 4+  Ankle dorsiflexion 5 5  (Blank rows = not tested)   TRANSFERS: Assistive device utilized: Single point cane  Sit to stand: Modified independence Stand to sit: Modified independence Chair to chair: Modified independence   GAIT: Gait pattern: step through pattern, decreased arm swing- Right, decreased arm swing- Left, decreased stride length, and lateral lean- Left Distance walked: 34' Assistive device utilized: Single point cane Level of assistance: Modified independence with AD Comments: Pt able to ambulate fair with some abnormalities in his gait pattern.  FUNCTIONAL TESTS:  5 times sit to stand: 11.81 Timed up and go (TUG): 12.98 6 minute walk test: Not performed at evaluation 10 meter walk test: 0.84 m/s Berg Balance Scale: 47  PATIENT SURVEYS:  FOTO 58/58  TODAY'S TREATMENT: DATE: 09/14/22   TherEx:  Ambulation to cancer center and back to the gym, 1000' while wearing 5# AW for endurance training Seated LAQ with 5# AW donned, 2x15 Seated resisted marching with 5# AW on thigh, 2x15 STS, 2x15 Seated hamstring curls with BTB resistance applied, 2x15 each LE    Neuro:  Korebalance Tux Racer, Practice mode, Extras, Bronze Set, x2      PATIENT EDUCATION: Education details: Pt educated on role of PT and services provided during current POC, along with prognosis and information about the clinic.  Person educated: Patient Education method: Explanation Education comprehension: verbalized  understanding  HOME EXERCISE PROGRAM:  Access Code: [email or web address] URL: [email or web address] Date: 08/17/2022 Prepared by: Parkman with Resistance  - 2 x daily - 7 x weekly - 2 sets - 10 reps - Sidelying Reverse Clamshell with Resistance  - 2 x daily - 7 x weekly - 2 sets - 10 reps - Prone Hip Extension with Resistance Loop  - 2 x daily - 7 x weekly - 2 sets - 10 reps - Supine Bridge with Resistance Band  - 2 x daily - 7 x weekly - 2 sets - 10 reps - Side Stepping with Resistance at Thighs  - 2 x daily - 7 x weekly - 2 sets - 10 reps  GOALS:  Goals reviewed with patient? Yes  SHORT TERM GOALS: Target date: 08/31/2022  Pt will be independent with HEP in order to demonstrate increased ability to perform tasks related to occupation/hobbies. Baseline: Pt given HEP 08/13/22 and noted above Goal status: INITIAL   LONG TERM GOALS: Target date: 10/26/2022  1.  Patient (> 46 years old) will complete five times sit to stand test in < 10 seconds indicating an increased LE strength and improved balance. Baseline: 11.81 sec Goal status: INITIAL  2.  Patient will increase FOTO score to equal to or greater than 64 to demonstrate statistically significant improvement in mobility and quality of life.  Baseline: FOTO - 58 Goal status: INITIAL   3.  Patient will increase Berg Balance score by > 6 points to demonstrate decreased fall risk during functional activities.  Baseline: 47 Goal status: INITIAL   4.  Patient will reduce timed up and go to <11 seconds to reduce fall risk and demonstrate improved transfer/gait ability. Baseline: 12.98 sec Goal status: INITIAL  5.  Patient will increase 10 meter walk test to >1.53ms as to improve gait speed for better community ambulation and to reduce fall risk. Baseline: 11.95 sec/0.84 m/s Goal status: INITIAL  6.  Patient will increase six minute walk test distance to >1000 for progression to community ambulator and  improve gait ability Baseline: 875 ft with SPC Goal status: INITIAL    ASSESSMENT:  CLINICAL IMPRESSION:  Pt performed well, however is still not feeling 100%.  Pt still puts forth great effort, however demonstrated some increased fatigue when performing the exercises, specifically with ambulation to the cancer center.  Pt enjoyed performing the balance training [facility] the [facility] and is able to perform weight shifting well forward/backward, but has difficulty with L/R due to the weakness noted in L LE.   Pt will continue to benefit from skilled therapy to address remaining deficits in order to improve overall QoL and return to PLOF.           OBJECTIVE IMPAIRMENTS: Abnormal gait, decreased activity tolerance, decreased balance, decreased endurance, decreased mobility, difficulty walking, decreased strength, and decreased safety awareness.   ACTIVITY LIMITATIONS: carrying, lifting, bending, sitting, squatting, and locomotion level  PARTICIPATION LIMITATIONS: community activity and yard work  PERSONAL FACTORS: Age, Fitness, Past/current experiences, Time since onset of injury/illness/exacerbation, and 3+ comorbidities: DM2, Psoriasis, Prostate Cancer, GI complications, SOB on exertion  are also affecting patient's functional outcome.   REHAB POTENTIAL: Fair pt has significant influx of comorbidities that will likely impact their potential progress with PT.  CLINICAL DECISION MAKING: Stable/uncomplicated  EVALUATION COMPLEXITY: Moderate  PLAN:  PT FREQUENCY: 2x/week  PT DURATION: 12 weeks  PLANNED INTERVENTIONS: Therapeutic exercises, Therapeutic activity, Neuromuscular re-education, Balance training, Gait training, Patient/Family education, Self Care, Joint mobilization, Joint manipulation, Stair training, Vestibular training, Canalith repositioning, Dry Needling, Electrical stimulation, Spinal manipulation, Spinal mobilization, Cryotherapy, Moist heat, and Manual  therapy  PLAN FOR NEXT SESSION:   Continue to improve LE strengthening and balance training.   JGwenlyn Saran PT, DPT Physical Therapist- [facility] 09/14/22, 5:22 PM

## 2022-09-15 ENCOUNTER — Telehealth: Payer: Self-pay | Admitting: *Deleted

## 2022-09-15 NOTE — Telephone Encounter (Signed)
Patient called reporting that he takes a monthly injection of Alumina for his psoriasis and is asking if that will interfere with his planned radiation therapy. Please return his call

## 2022-09-16 ENCOUNTER — Ambulatory Visit: Payer: Medicare Other

## 2022-09-16 DIAGNOSIS — R262 Difficulty in walking, not elsewhere classified: Secondary | ICD-10-CM | POA: Diagnosis not present

## 2022-09-16 DIAGNOSIS — M6281 Muscle weakness (generalized): Secondary | ICD-10-CM

## 2022-09-16 NOTE — Telephone Encounter (Signed)
Called patient to advise him per Dr. Baruch Gouty that it is ok to receive Alumina. Patient verbalized understanding.

## 2022-09-16 NOTE — Therapy (Signed)
OUTPATIENT PHYSICAL THERAPY NEURO TREATMENT/PHYSICAL THERAPY PROGRESS NOTE   Dates of reporting period  08/03/2022   to   09/16/2022    Patient Name: Brian Callahan MRN: [id number] DOB:05/18/1941, 82 y.o., male Today's Date: 09/16/2022   PCP: [name], MD REFERRING PROVIDER: [name], MD  END OF SESSION:   PT End of Session - 09/16/22 1150     Visit Number 10    Number of Visits 24    Date for PT Re-Evaluation 10/26/22    PT Start Time 1150    PT Stop Time 1230    PT Time Calculation (min) 40 min    Equipment Utilized During Treatment Gait belt    Activity Tolerance Patient tolerated treatment well    Behavior During Therapy [facility] for tasks assessed/performed                 Past Medical History:  Diagnosis Date   Anemia    pt denies 01/18/20   BP (high blood pressure) 05/23/2015   BPH with obstruction/lower urinary tract symptoms    COPD (chronic obstructive pulmonary disease) ([place])    Diabetes mellitus without complication ([place])    diet controlled   Dyspnea    Encounter for long-term (current) use of other high-risk medications    GERD (gastroesophageal reflux disease)    gastritis   GI bleed    Gout    H/O hernia repair mid-90s   x2   History of elevated PSA    History of [place] spotted fever    HLD (hyperlipidemia)    HTN (hypertension)    Hyperglycemia    Hyperuricemia    Incomplete bladder emptying    Macular degeneration    Obesity    Psoriasis    Psoriasis    Urinary frequency    Past Surgical History:  Procedure Laterality Date   APPENDECTOMY     BACK SURGERY     neck and spine 2007   CERVICAL SPINE SURGERY  2007   CHOLECYSTECTOMY N/A 10/18/2017   Procedure: LAPAROSCOPIC CHOLECYSTECTOMY;  Surgeon: [name], MD;  Location: [facility] ORS;  Service: General;  Laterality: N/A;   COLECTOMY  1998   partial, due to obstruction   COLONOSCOPY     lost 10 units of blood   COLONOSCOPY WITH PROPOFOL N/A 09/27/2019    Procedure: COLONOSCOPY WITH PROPOFOL;  Surgeon: [name], [name], MD;  Location: [facility] ENDOSCOPY;  Service: Gastroenterology;  Laterality: N/A;   CYSTOSCOPY WITH INSERTION OF UROLIFT N/A 01/23/2020   Procedure: CYSTOSCOPY WITH INSERTION OF UROLIFT;  Surgeon: [name], MD;  Location: [facility] ORS;  Service: Urology;  Laterality: N/A;   ESOPHAGOGASTRODUODENOSCOPY N/A 10/14/2016   Procedure: ESOPHAGOGASTRODUODENOSCOPY (EGD);  Surgeon: [name], MD;  Location: [facility] ENDOSCOPY;  Service: Endoscopy;  Laterality: N/A;   ESOPHAGOGASTRODUODENOSCOPY (EGD) WITH PROPOFOL N/A 09/27/2019   Procedure: ESOPHAGOGASTRODUODENOSCOPY (EGD) WITH PROPOFOL;  Surgeon: [name], [name], MD;  Location: [facility] ENDOSCOPY;  Service: Gastroenterology;  Laterality: N/A;   EYE SURGERY     bilateral cataracts   HERNIA REPAIR  mid-90s   x2  inguinal   PROSTATE BIOPSY  2013   SURGERY SCROTAL / TESTICULAR     for fibrous growth   Patient Active Problem List   Diagnosis Date Noted   Diabetes mellitus type 2 with complications ([place]) 0000000   Chest pain with moderate risk for cardiac etiology 11/29/2017   Positive cardiac stress test 11/29/2017   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 11/08/2017   Esophagitis,  reflux 11/08/2017   SOB (shortness of breath) on exertion 07/02/2017   BPH with obstruction/lower urinary tract symptoms 11/21/2015   History of elevated PSA 11/21/2015   H/O gastric ulcer 09/05/2015   Disorder of eyeball 05/23/2015   H/O infectious disease 05/23/2015   Blood glucose elevated 05/23/2015   BP (high blood pressure) 05/23/2015   HLD (hyperlipidemia) 05/23/2015   Psoriasis 05/23/2015   Absolute anemia 01/02/2014   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   Elevated prostate specific antigen (PSA) 01/02/2014   Elevated blood uric acid level 01/02/2014   Enlarged prostate without lower urinary tract symptoms (luts) 01/02/2014   Bleeding gastrointestinal 04/15/2012    ONSET DATE: ~10  years  REFERRING DIAG:  R26.89 (ICD-10-CM) - Balance problem  R26.9 (ICD-10-CM) - Gait abnormality    THERAPY DIAG:  Difficulty in walking, not elsewhere classified  Muscle weakness (generalized)  Rationale for Evaluation and Treatment: Rehabilitation   SUBJECTIVE:                                                                                                                                                                                             SUBJECTIVE STATEMENT:  Pt reports he is feeling much better than the last visit and is finally recovering from the sickness he experienced over the last several weeks.   Pt accompanied by: self  PERTINENT HISTORY:   Pt is 82 y.o. male who has experienced a decline in his overall functional mobility and stability since Covid and being unable to go back to the gym.  Pt notes that he has noticed it being increasingly difficult performing tasks without losing his balance and just feels generalized weakness.  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    PAIN: Are you having pain? No  PRECAUTIONS: None  WEIGHT BEARING RESTRICTIONS: No  FALLS: Has patient fallen in last 6 months? No  LIVING ENVIRONMENT: Lives with: lives with their spouse Lives in: House/apartment Stairs: Yes: External: 4 steps; on right going up Has following equipment at home: Single point cane, Walker - 2 wheeled, and Grab bars  PLOF: Independent; pt does state he has difficulty tying shoes, but he has slip on shoes that assist with that.  PATIENT GOALS: Pt wants to be more mobile and be able to exercise.  OBJECTIVE:   DIAGNOSTIC FINDINGS:   EXAM 05/29/22: MRI HEAD WITHOUT CONTRAST  IMPRESSION: 1. No acute intracranial abnormality. 2. 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 3. Small  chronic right cerebellar infarct.  COGNITION: Overall cognitive status: Within functional limits for tasks assessed   SENSATION: WFL  COORDINATION: WFL  POSTURE: rounded shoulders and forward head   LOWER EXTREMITY MMT:    MMT Right Eval Left Eval  Hip flexion 5 4+  Hip abduction 4+ 4+  Hip adduction 4+ 4+  Knee flexion 5 4+  Knee extension 5 4+  Ankle dorsiflexion 5 5  (Blank rows = not tested)   TRANSFERS: Assistive device utilized: Single point cane  Sit to stand: Modified independence Stand to sit: Modified independence Chair to chair: Modified independence   GAIT: Gait pattern: step through pattern, decreased arm swing- Right, decreased arm swing- Left, decreased stride length, and lateral lean- Left Distance walked: 53' Assistive device utilized: Single point cane Level of assistance: Modified independence with AD Comments: Pt able to ambulate fair with some abnormalities in his gait pattern.  FUNCTIONAL TESTS:  5 times sit to stand: 11.81 Timed up and go (TUG): 12.98 6 minute walk test: Not performed at evaluation 10 meter walk test: 0.84 m/s Berg Balance Scale: 47  PATIENT SURVEYS:  FOTO 58/58  TODAY'S TREATMENT: DATE: 09/16/22    Neuro:  Goal assessment performed as noted below:    PATIENT EDUCATION: Education details: Pt educated on role of PT and services provided during current POC, along with prognosis and information about the clinic.  Person educated: Patient Education method: Explanation Education comprehension: verbalized understanding  HOME EXERCISE PROGRAM:  Access Code: [email or web address] URL: [email or web address] Date: 08/17/2022 Prepared by: [place] with Resistance  - 2 x daily - 7 x weekly - 2 sets - 10 reps - Sidelying Reverse Clamshell with Resistance  - 2 x daily - 7 x weekly - 2 sets - 10 reps - Prone Hip Extension with Resistance Loop  - 2 x daily - 7 x weekly - 2 sets - 10  reps - Supine Bridge with Resistance Band  - 2 x daily - 7 x weekly - 2 sets - 10 reps - Side Stepping with Resistance at Thighs  - 2 x daily - 7 x weekly - 2 sets - 10 reps  GOALS:  Goals reviewed with patient? Yes  SHORT TERM GOALS: Target date: 08/31/2022  Pt will be independent with HEP in order to demonstrate increased ability to perform tasks related to occupation/hobbies. Baseline: Pt given HEP 08/13/22 and noted above Goal status: INITIAL   LONG TERM GOALS: Target date: 10/26/2022  1.  Patient (> 24 years old) will complete five times sit to stand test in < 10 seconds indicating an increased LE strength and improved balance. Baseline: 11.81 sec 09/16/22: Not attempted Goal status: INITIAL  2.  Patient will increase FOTO score to equal to or greater than 64 to demonstrate statistically significant improvement in mobility and quality of life.  Baseline: FOTO - 58 09/16/22: 56 Goal status: IN PROGRESS   3.  Patient will increase Berg Balance score by > 6 points to demonstrate decreased fall risk during functional activities. Baseline: 47 09/16/22: 54 Goal status: MET   4.  Patient will reduce timed up and go to <11 seconds to reduce fall risk and demonstrate improved transfer/gait ability. Baseline: 12.98 sec 09/16/22: 11.96 sec Goal status: IN PROGRESS  5.  Patient will increase 10 meter walk test to >1.19ms as to improve gait speed for better community ambulation and to reduce fall risk. Baseline: 11.95 sec/0.84 m/s 09/16/22: 9.68 sec/1.033 m/s  Goal status: MET  6.  Patient will increase six minute walk test distance to >1000 for progression to community ambulator and improve gait ability Baseline: 875 ft with [facility] 09/16/22: 917 ft with no AD Goal status: IN PROGRESS   7.  Patient will increase FGA score by > 4 points to demonstrate decreased fall risk during functional activities. Baseline:  to do at next visit Goal status: INITIAL   ASSESSMENT:  CLINICAL  IMPRESSION:  Pt is making good progress towards most goals, only regressing on FOTO at this time.  Pt able to either meet or improve on the other 5/6 goals.  Pt notes he is likely limited today still by the recovery process from his sickness that he experienced over the last several weeks.  Pt to continue with current strengthening measures and to improve overall balance moving forward in order to improve overall QoL.  Patient's condition has the potential to improve in response to therapy. Maximum improvement is yet to be obtained. The anticipated improvement is attainable and reasonable in a generally predictable time.   Pt will continue to benefit from skilled therapy to address remaining deficits in order to improve overall QoL and return to PLOF.        OBJECTIVE IMPAIRMENTS: Abnormal gait, decreased activity tolerance, decreased balance, decreased endurance, decreased mobility, difficulty walking, decreased strength, and decreased safety awareness.   ACTIVITY LIMITATIONS: carrying, lifting, bending, sitting, squatting, and locomotion level  PARTICIPATION LIMITATIONS: community activity and yard work  PERSONAL FACTORS: Age, Fitness, Past/current experiences, Time since onset of injury/illness/exacerbation, and 3+ comorbidities: DM2, Psoriasis, Prostate Cancer, GI complications, SOB on exertion  are also affecting patient's functional outcome.   REHAB POTENTIAL: Fair pt has significant influx of comorbidities that will likely impact their potential progress with PT.  CLINICAL DECISION MAKING: Stable/uncomplicated  EVALUATION COMPLEXITY: Moderate  PLAN:  PT FREQUENCY: 2x/week  PT DURATION: 12 weeks  PLANNED INTERVENTIONS: Therapeutic exercises, Therapeutic activity, Neuromuscular re-education, Balance training, Gait training, Patient/Family education, Self Care, Joint mobilization, Joint manipulation, Stair training, Vestibular training, Canalith repositioning, Dry Needling, Electrical  stimulation, Spinal manipulation, Spinal mobilization, Cryotherapy, Moist heat, and Manual therapy  PLAN FOR NEXT SESSION:   Perform FGA and 5xSTS for goals   Gwenlyn Saran, PT, DPT Physical Therapist- [facility]  09/16/22, 6:21 PM

## 2022-09-21 ENCOUNTER — Ambulatory Visit: Payer: Medicare Other

## 2022-09-21 ENCOUNTER — Telehealth: Payer: Self-pay

## 2022-09-21 DIAGNOSIS — R262 Difficulty in walking, not elsewhere classified: Secondary | ICD-10-CM | POA: Diagnosis not present

## 2022-09-21 DIAGNOSIS — M6281 Muscle weakness (generalized): Secondary | ICD-10-CM

## 2022-09-21 NOTE — Telephone Encounter (Signed)
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   We have sent the markers with him with instructions to bring to this appointment.   Thanks, Ranelle Oyster

## 2022-09-21 NOTE — Therapy (Signed)
OUTPATIENT PHYSICAL THERAPY NEURO TREATMENT   Patient Name: Brian Callahan MRN: [id number] DOB:01-19-1941, 82 y.o., male Today's Date: 09/21/2022   PCP: [name], MD REFERRING PROVIDER: [name], MD  END OF SESSION:   PT End of Session - 09/21/22 1144     Visit Number 11    Number of Visits 24    Date for PT Re-Evaluation 10/26/22    PT Start Time [id number]    PT Stop Time 63    PT Time Calculation (min) 45 min    Equipment Utilized During Treatment Gait belt    Activity Tolerance Patient tolerated treatment well    Behavior During Therapy [facility] for tasks assessed/performed              Past Medical History:  Diagnosis Date   Anemia    pt denies 01/18/20   BP (high blood pressure) 05/23/2015   BPH with obstruction/lower urinary tract symptoms    COPD (chronic obstructive pulmonary disease) ([place])    Diabetes mellitus without complication ([place])    diet controlled   Dyspnea    Encounter for long-term (current) use of other high-risk medications    GERD (gastroesophageal reflux disease)    gastritis   GI bleed    Gout    H/O hernia repair mid-90s   x2   History of elevated PSA    History of [place] spotted fever    HLD (hyperlipidemia)    HTN (hypertension)    Hyperglycemia    Hyperuricemia    Incomplete bladder emptying    Macular degeneration    Obesity    Psoriasis    Psoriasis    Urinary frequency    Past Surgical History:  Procedure Laterality Date   APPENDECTOMY     BACK SURGERY     neck and spine 2007   CERVICAL SPINE SURGERY  2007   CHOLECYSTECTOMY N/A 10/18/2017   Procedure: LAPAROSCOPIC CHOLECYSTECTOMY;  Surgeon: [name], MD;  Location: [facility] ORS;  Service: General;  Laterality: N/A;   COLECTOMY  1998   partial, due to obstruction   COLONOSCOPY     lost 10 units of blood   COLONOSCOPY WITH PROPOFOL N/A 09/27/2019   Procedure: COLONOSCOPY WITH PROPOFOL;  Surgeon: [name], [name], MD;  Location: [facility] ENDOSCOPY;   Service: Gastroenterology;  Laterality: N/A;   CYSTOSCOPY WITH INSERTION OF UROLIFT N/A 01/23/2020   Procedure: CYSTOSCOPY WITH INSERTION OF UROLIFT;  Surgeon: [name], MD;  Location: [facility] ORS;  Service: Urology;  Laterality: N/A;   ESOPHAGOGASTRODUODENOSCOPY N/A 10/14/2016   Procedure: ESOPHAGOGASTRODUODENOSCOPY (EGD);  Surgeon: [name], MD;  [facility] ENDOSCOPY;  Service: Endoscopy;  Laterality: N/A;   ESOPHAGOGASTRODUODENOSCOPY (EGD) WITH PROPOFOL N/A 09/27/2019   Procedure: ESOPHAGOGASTRODUODENOSCOPY (EGD) WITH PROPOFOL;  Surgeon: [name], [name], MD;  Location: [facility] ENDOSCOPY;  Service: Gastroenterology;  Laterality: N/A;   EYE SURGERY     bilateral cataracts   HERNIA REPAIR  mid-90s   x2  inguinal   PROSTATE BIOPSY  2013   SURGERY SCROTAL / TESTICULAR     for fibrous growth   Patient Active Problem List   Diagnosis Date Noted   Diabetes mellitus type 2 with complications ([place]) 0000000   Chest pain with moderate risk for cardiac etiology 11/29/2017   Positive cardiac stress test 11/29/2017   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 11/08/2017   Esophagitis, reflux 11/08/2017   SOB (shortness of breath) on exertion 07/02/2017   BPH with obstruction/lower urinary tract symptoms 11/21/2015  History of elevated PSA 11/21/2015   H/O gastric ulcer 09/05/2015   Disorder of eyeball 05/23/2015   H/O infectious disease 05/23/2015   Blood glucose elevated 05/23/2015   BP (high blood pressure) 05/23/2015   HLD (hyperlipidemia) 05/23/2015   Psoriasis 05/23/2015   Absolute anemia 01/02/2014   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   Elevated prostate specific antigen (PSA) 01/02/2014   Elevated blood uric acid level 01/02/2014   Enlarged prostate without lower urinary tract symptoms (luts) 01/02/2014   Bleeding gastrointestinal 04/15/2012    ONSET DATE: ~10 years  REFERRING DIAG:  R26.89 (ICD-10-CM) - Balance problem  R26.9 (ICD-10-CM) - Gait abnormality     THERAPY DIAG:  Difficulty in walking, not elsewhere classified  Muscle weakness (generalized)  Rationale for Evaluation and Treatment: Rehabilitation   SUBJECTIVE:                                                                                                                                                                                             SUBJECTIVE STATEMENT:  Pt reports he is feeling 98% of himself today.  Pt notes he didn't do much over the weekend other than watching the race.  Pt accompanied by: self  PERTINENT HISTORY:   Pt is 82 y.o. male who has experienced a decline in his overall functional mobility and stability since Covid and being unable to go back to the gym.  Pt notes that he has noticed it being increasingly difficult performing tasks without losing his balance and just feels generalized weakness.  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    PAIN: Are you having pain? No  PRECAUTIONS: None  WEIGHT BEARING RESTRICTIONS: No  FALLS: Has patient fallen in last 6 months? No  LIVING ENVIRONMENT: Lives with: lives with their spouse Lives in: House/apartment Stairs: Yes: External: 4 steps; on right going up Has following equipment at home: Single point cane, Walker - 2 wheeled, and Grab bars  PLOF: Independent; pt does state he has difficulty tying shoes, but he has slip on shoes that assist with that.  PATIENT GOALS: Pt wants to be more mobile and be able to exercise.  OBJECTIVE:   DIAGNOSTIC FINDINGS:   EXAM 05/29/22: MRI HEAD WITHOUT CONTRAST  IMPRESSION: 1. No acute intracranial abnormality. 2. 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 3. Small chronic right cerebellar infarct.  COGNITION: Overall cognitive status: Within functional limits for tasks  assessed   SENSATION: [facility]  COORDINATION: WFL  POSTURE: rounded shoulders and forward head   LOWER EXTREMITY MMT:    MMT Right Eval Left Eval  Hip flexion 5 4+  Hip abduction 4+ 4+  Hip adduction 4+ 4+  Knee flexion 5 4+  Knee extension 5 4+  Ankle dorsiflexion 5 5  (Blank rows = not tested)   TRANSFERS: Assistive device utilized: Single point cane  Sit to stand: Modified independence Stand to sit: Modified independence Chair to chair: Modified independence   GAIT: Gait pattern: step through pattern, decreased arm swing- Right, decreased arm swing- Left, decreased stride length, and lateral lean- Left Distance walked: 26' Assistive device utilized: Single point cane Level of assistance: Modified independence with AD Comments: Pt able to ambulate fair with some abnormalities in his gait pattern.  FUNCTIONAL TESTS:  5 times sit to stand: 11.81 Timed up and go (TUG): 12.98 6 minute walk test: Not performed at evaluation 10 meter walk test: 0.84 m/s Berg Balance Scale: 47  PATIENT SURVEYS:  FOTO 58/58  TODAY'S TREATMENT: DATE: 09/21/22  TherEx:   Seated LAQ with rainbow physioball between knees, 2x15 STS, 2x15 (5 standard, 5 staggered with each LE) Seated hamstring curls with BTB resistance applied, 2x15 each LE Minisquats with use of UE for support on back of chair, 2x10 Lateral walking in main hallway, 2x length of the hallway each LE leading Backwards walking utilized for increased hip extension exercises, x2 laps total  Neuro:  [employer], Practice mode, Extras, Bronze Set, x1, 45 herring caught, 6 min 48 sec Korebalance Tux Racer, 3 total race attempts, stability level 2, not able to pass the first race after the first 3 attempts   PATIENT EDUCATION: Education details: Pt educated on role of PT and services provided during current POC, along with prognosis and information about the clinic.  Person educated: Patient Education method:  Explanation Education comprehension: verbalized understanding  HOME EXERCISE PROGRAM:  Access Code: [email or web address] URL: [email or web address] Date: 08/17/2022 Prepared by: [place] with Resistance  - 2 x daily - 7 x weekly - 2 sets - 10 reps - Sidelying Reverse Clamshell with Resistance  - 2 x daily - 7 x weekly - 2 sets - 10 reps - Prone Hip Extension with Resistance Loop  - 2 x daily - 7 x weekly - 2 sets - 10 reps - Supine Bridge with Resistance Band  - 2 x daily - 7 x weekly - 2 sets - 10 reps - Side Stepping with Resistance at Thighs  - 2 x daily - 7 x weekly - 2 sets - 10 reps  GOALS:  Goals reviewed with patient? Yes  SHORT TERM GOALS: Target date: 08/31/2022  Pt will be independent with HEP in order to demonstrate increased ability to perform tasks related to occupation/hobbies. Baseline: Pt given HEP 08/13/22 and noted above Goal status: INITIAL   LONG TERM GOALS: Target date: 10/26/2022  1.  Patient (> 1 years old) will complete five times sit to stand test in < 10 seconds indicating an increased LE strength and improved balance. Baseline: 11.81 sec 09/16/22: Not attempted Goal status: INITIAL  2.  Patient will increase FOTO score to equal to or greater than 64 to demonstrate statistically significant improvement in mobility and quality of life.  Baseline: FOTO - 58 09/16/22: 56 Goal status: IN PROGRESS   3.  Patient will increase Berg Balance score by > 6 points to demonstrate decreased fall risk during functional activities. Baseline: 47  09/16/22: 54 Goal status: MET   4.  Patient will reduce timed up and go to <11 seconds to reduce fall risk and demonstrate improved transfer/gait ability. Baseline: 12.98 sec 09/16/22: 11.96 sec Goal status: IN PROGRESS  5.  Patient will increase 10 meter walk test to >1.9ms as to improve gait speed for better community ambulation and to reduce fall risk. Baseline: 11.95 sec/0.84 m/s 09/16/22:  9.68 sec/1.033 m/s Goal status: MET  6.  Patient will increase six minute walk test distance to >1000 for progression to community ambulator and improve gait ability [facility]: 917 ft with no AD Goal status: IN PROGRESS   7.  Patient will increase FGA score by > 4 points to demonstrate decreased fall risk during functional activities. Baseline:  to do at next visit Goal status: INITIAL   ASSESSMENT:  CLINICAL IMPRESSION:  Pt continues to perform well with all the tasks given and enjoys performing that balance and weight shifting exercises [facility].  Pt does still have some difficulty with lateral walking going to the L side due to the weakness in his left hip and the pain that he experiences at times.  Pt will continue to benefit from exercises that target the L hip in order to reduce the pain and increase the overall strength that he has in the region.   Pt will continue to benefit from skilled therapy to address remaining deficits in order to improve overall QoL and return to PLOF.    Pt needs to have FGA and 5xSTS added to goals at next visit.     OBJECTIVE IMPAIRMENTS: Abnormal gait, decreased activity tolerance, decreased balance, decreased endurance, decreased mobility, difficulty walking, decreased strength, and decreased safety awareness.   ACTIVITY LIMITATIONS: carrying, lifting, bending, sitting, squatting, and locomotion level  PARTICIPATION LIMITATIONS: community activity and yard work  PERSONAL FACTORS: Age, Fitness, Past/current experiences, Time since onset of injury/illness/exacerbation, and 3+ comorbidities: DM2, Psoriasis, Prostate Cancer, GI complications, SOB on exertion  are also affecting patient's functional outcome.   REHAB POTENTIAL: Fair pt has significant influx of comorbidities that will likely impact their potential progress with PT.  CLINICAL DECISION MAKING: Stable/uncomplicated  EVALUATION COMPLEXITY:  Moderate  PLAN:  PT FREQUENCY: 2x/week  PT DURATION: 12 weeks  PLANNED INTERVENTIONS: Therapeutic exercises, Therapeutic activity, Neuromuscular re-education, Balance training, Gait training, Patient/Family education, Self Care, Joint mobilization, Joint manipulation, Stair training, Vestibular training, Canalith repositioning, Dry Needling, Electrical stimulation, Spinal manipulation, Spinal mobilization, Cryotherapy, Moist heat, and Manual therapy  PLAN FOR NEXT SESSION:    Perform FGA and 5xSTS for goals   JGwenlyn Saran PT, DPT Physical Therapist- [facility] 09/21/22, 2:54 PM

## 2022-09-21 NOTE — Telephone Encounter (Signed)
Eligard PA via [facility] approved:  Dates 09/21/22 -09/22/23   Auth# [id number]

## 2022-09-23 ENCOUNTER — Ambulatory Visit: Payer: Medicare Other

## 2022-09-23 DIAGNOSIS — R262 Difficulty in walking, not elsewhere classified: Secondary | ICD-10-CM

## 2022-09-23 DIAGNOSIS — M6281 Muscle weakness (generalized): Secondary | ICD-10-CM

## 2022-09-23 NOTE — Therapy (Signed)
OUTPATIENT PHYSICAL THERAPY NEURO TREATMENT   Patient Name: Brian Callahan MRN: [id number] DOB:March 27, 1941, 82 y.o., male Today's Date: 09/23/2022   PCP: [name], MD REFERRING PROVIDER: [name], MD  END OF SESSION:   PT End of Session - 09/23/22 1146     Visit Number 12    Number of Visits 24    Date for PT Re-Evaluation 10/26/22    PT Start Time [id number]    PT Stop Time 26    PT Time Calculation (min) 46 min    Equipment Utilized During Treatment Gait belt    Activity Tolerance Patient tolerated treatment well    Behavior During Therapy [facility] for tasks assessed/performed               Past Medical History:  Diagnosis Date   Anemia    pt denies 01/18/20   BP (high blood pressure) 05/23/2015   BPH with obstruction/lower urinary tract symptoms    COPD (chronic obstructive pulmonary disease) ([place])    Diabetes mellitus without complication ([place])    diet controlled   Dyspnea    Encounter for long-term (current) use of other high-risk medications    GERD (gastroesophageal reflux disease)    gastritis   GI bleed    Gout    H/O hernia repair mid-90s   x2   History of elevated PSA    History of Rocky Mountain spotted fever    HLD (hyperlipidemia)    HTN (hypertension)    Hyperglycemia    Hyperuricemia    Incomplete bladder emptying    Macular degeneration    Obesity    Psoriasis    Psoriasis    Urinary frequency    Past Surgical History:  Procedure Laterality Date   APPENDECTOMY     BACK SURGERY     neck and spine 2007   CERVICAL SPINE SURGERY  2007   CHOLECYSTECTOMY N/A 10/18/2017   Procedure: LAPAROSCOPIC CHOLECYSTECTOMY;  Surgeon: [name], MD;  Location: [facility] ORS;  Service: General;  Laterality: N/A;   COLECTOMY  1998   partial, due to obstruction   COLONOSCOPY     lost 10 units of blood   COLONOSCOPY WITH PROPOFOL N/A 09/27/2019   Procedure: COLONOSCOPY WITH PROPOFOL;  Surgeon: [name], [name], MD;  Location: [facility] ENDOSCOPY;   Service: Gastroenterology;  Laterality: N/A;   CYSTOSCOPY WITH INSERTION OF UROLIFT N/A 01/23/2020   Procedure: CYSTOSCOPY WITH INSERTION OF UROLIFT;  Surgeon: [name], MD;  Location: [facility] ORS;  Service: Urology;  Laterality: N/A;   ESOPHAGOGASTRODUODENOSCOPY N/A 10/14/2016   Procedure: ESOPHAGOGASTRODUODENOSCOPY (EGD);  Surgeon: [name], MD;  Location: [facility] ENDOSCOPY;  Service: Endoscopy;  Laterality: N/A;   ESOPHAGOGASTRODUODENOSCOPY (EGD) WITH PROPOFOL N/A 09/27/2019   Procedure: ESOPHAGOGASTRODUODENOSCOPY (EGD) WITH PROPOFOL;  Surgeon: [name], [name], MD;  Location: [facility] ENDOSCOPY;  Service: Gastroenterology;  Laterality: N/A;   EYE SURGERY     bilateral cataracts   HERNIA REPAIR  mid-90s   x2  inguinal   PROSTATE BIOPSY  2013   SURGERY SCROTAL / TESTICULAR     for fibrous growth   Patient Active Problem List   Diagnosis Date Noted   Diabetes mellitus type 2 with complications ([place]) 0000000   Chest pain with moderate risk for cardiac etiology 11/29/2017   Positive cardiac stress test 11/29/2017   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 11/08/2017   Esophagitis, reflux 11/08/2017   SOB (shortness of breath) on exertion 07/02/2017   BPH with obstruction/lower urinary tract symptoms 11/21/2015  History of elevated PSA 11/21/2015   H/O gastric ulcer 09/05/2015   Disorder of eyeball 05/23/2015   H/O infectious disease 05/23/2015   Blood glucose elevated 05/23/2015   BP (high blood pressure) 05/23/2015   HLD (hyperlipidemia) 05/23/2015   Psoriasis 05/23/2015   Absolute anemia 01/02/2014   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   Elevated prostate specific antigen (PSA) 01/02/2014   Elevated blood uric acid level 01/02/2014   Enlarged prostate without lower urinary tract symptoms (luts) 01/02/2014   Bleeding gastrointestinal 04/15/2012    ONSET DATE: ~10 years  REFERRING DIAG:  R26.89 (ICD-10-CM) - Balance problem  R26.9 (ICD-10-CM) - Gait abnormality     THERAPY DIAG:  No diagnosis found.  Rationale for Evaluation and Treatment: Rehabilitation   SUBJECTIVE:                                                                                                                                                                                             SUBJECTIVE STATEMENT:  Pt reports that he finally was able to establish an appointment with the urologist.  The appointment is in April.       Pt accompanied by: self  PERTINENT HISTORY:   Pt is 82 y.o. male who has experienced a decline in his overall functional mobility and stability since Covid and being unable to go back to the gym.  Pt notes that he has noticed it being increasingly difficult performing tasks without losing his balance and just feels generalized weakness.  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    PAIN: Are you having pain? No  PRECAUTIONS: None  WEIGHT BEARING RESTRICTIONS: No  FALLS: Has patient fallen in last 6 months? No  LIVING ENVIRONMENT: Lives with: lives with their spouse Lives in: House/apartment Stairs: Yes: External: 4 steps; on right going up Has following equipment at home: Single point cane, Walker - 2 wheeled, and Grab bars  PLOF: Independent; pt does state he has difficulty tying shoes, but he has slip on shoes that assist with that.  PATIENT GOALS: Pt wants to be more mobile and be able to exercise.  OBJECTIVE:   DIAGNOSTIC FINDINGS:   EXAM 05/29/22: MRI HEAD WITHOUT CONTRAST  IMPRESSION: 1. No acute intracranial abnormality. 2. 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 3. Small chronic right cerebellar infarct.  COGNITION: Overall cognitive status: Within functional limits for tasks assessed   SENSATION: WFL  COORDINATION: WFL  POSTURE: rounded  shoulders  and forward head   LOWER EXTREMITY MMT:    MMT Right Eval Left Eval  Hip flexion 5 4+  Hip abduction 4+ 4+  Hip adduction 4+ 4+  Knee flexion 5 4+  Knee extension 5 4+  Ankle dorsiflexion 5 5  (Blank rows = not tested)   TRANSFERS: Assistive device utilized: Single point cane  Sit to stand: Modified independence Stand to sit: Modified independence Chair to chair: Modified independence   GAIT: Gait pattern: step through pattern, decreased arm swing- Right, decreased arm swing- Left, decreased stride length, and lateral lean- Left Distance walked: 74' Assistive device utilized: Single point cane Level of assistance: Modified independence with AD Comments: Pt able to ambulate fair with some abnormalities in his gait pattern.  FUNCTIONAL TESTS:  5 times sit to stand: 11.81 Timed up and go (TUG): 12.98 6 minute walk test: Not performed at evaluation 10 meter walk test: 0.84 m/s Berg Balance Scale: 47  PATIENT SURVEYS:  FOTO 58/58  TODAY'S TREATMENT: DATE: 09/23/22    TherEx:   Seated LAQ with rainbow physioball between knees, 2x15 STS, 2x15 (5 standard, 5 staggered with each LE) Seated hip adduction into physioball, 3-5 sec holds, 2x15 Forward lunges onto 12" box, 2x15  Lateral lunges onto 12" box, 2x15    Neuro:  [employer], Practice mode, Extras, , x1, 39 herring caught, 8 min 39 sec     Manual:  Supine long arc distraction applied to the L hip, x6 minutes total STM with TP release to gluteal region/ITB for pain modulation, x4 minutes     PATIENT EDUCATION: Education details: Pt educated on role of PT and services provided during current POC, along with prognosis and information about the clinic.  Person educated: Patient Education method: Explanation Education comprehension: verbalized understanding  HOME EXERCISE PROGRAM:  Access Code: [email or web address] URL: [email or web address] Date: 08/17/2022 Prepared by:  [place] with Resistance  - 2 x daily - 7 x weekly - 2 sets - 10 reps - Sidelying Reverse Clamshell with Resistance  - 2 x daily - 7 x weekly - 2 sets - 10 reps - Prone Hip Extension with Resistance Loop  - 2 x daily - 7 x weekly - 2 sets - 10 reps - Supine Bridge with Resistance Band  - 2 x daily - 7 x weekly - 2 sets - 10 reps - Side Stepping with Resistance at Thighs  - 2 x daily - 7 x weekly - 2 sets - 10 reps  GOALS:  Goals reviewed with patient? Yes  SHORT TERM GOALS: Target date: 08/31/2022  Pt will be independent with HEP in order to demonstrate increased ability to perform tasks related to occupation/hobbies. Baseline: Pt given HEP 08/13/22 and noted above Goal status: INITIAL   LONG TERM GOALS: Target date: 10/26/2022  1.  Patient (> 45 years old) will complete five times sit to stand test in < 10 seconds indicating an increased LE strength and improved balance. Baseline: 11.81 sec 09/16/22: Not attempted Goal status: INITIAL  2.  Patient will increase FOTO score to equal to or greater than 64 to demonstrate statistically significant improvement in mobility and quality of life.  Baseline: FOTO - 58 09/16/22: 56 Goal status: IN PROGRESS   3.  Patient will increase Berg Balance score by > 6 points to demonstrate decreased fall risk during functional activities. Baseline: 47 09/16/22: 54 Goal status: MET   4.  Patient will reduce timed up and go to <  11 seconds to reduce fall risk and demonstrate improved transfer/gait ability. Baseline: 12.98 sec 09/16/22: 11.96 sec Goal status: IN PROGRESS  5.  Patient will increase 10 meter walk test to >1.18ms as to improve gait speed for better community ambulation and to reduce fall risk. Baseline: 11.95 sec/0.84 m/s 09/16/22: 9.68 sec/1.033 m/s Goal status: MET  6.  Patient will increase six minute walk test distance to >1000 for progression to community ambulator and improve gait ability Baseline: 875 ft  [facility]: 917 ft with no AD Goal status: IN PROGRESS   7.  Patient will increase FGA score by > 4 points to demonstrate decreased fall risk during functional activities. Baseline:  to do at next visit Goal status: INITIAL   ASSESSMENT:  CLINICAL IMPRESSION: Pt limited by pain in the L hip and had difficulty ambulating due to the pain experienced.  Pt assisted with manual therapy in order to improve the ROM of the L hip and improve the pain level to tolerate exercises.  Pt did improve pain and was able to continue with therapy, but FGA was limited due to that.  Pt continues to improve in balance and related tasks, but will need FGA at future session as long as hip is in better condition.   Pt will continue to benefit from skilled therapy to address remaining deficits in order to improve overall QoL and return to PLOF.      OBJECTIVE IMPAIRMENTS: Abnormal gait, decreased activity tolerance, decreased balance, decreased endurance, decreased mobility, difficulty walking, decreased strength, and decreased safety awareness.   ACTIVITY LIMITATIONS: carrying, lifting, bending, sitting, squatting, and locomotion level  PARTICIPATION LIMITATIONS: community activity and yard work  PERSONAL FACTORS: Age, Fitness, Past/current experiences, Time since onset of injury/illness/exacerbation, and 3+ comorbidities: DM2, Psoriasis, Prostate Cancer, GI complications, SOB on exertion  are also affecting patient's functional outcome.   REHAB POTENTIAL: Fair pt has significant influx of comorbidities that will likely impact their potential progress with PT.  CLINICAL DECISION MAKING: Stable/uncomplicated  EVALUATION COMPLEXITY: Moderate  PLAN:  PT FREQUENCY: 2x/week  PT DURATION: 12 weeks  PLANNED INTERVENTIONS: Therapeutic exercises, Therapeutic activity, Neuromuscular re-education, Balance training, Gait training, Patient/Family education, Self Care, Joint mobilization, Joint manipulation,  Stair training, Vestibular training, Canalith repositioning, Dry Needling, Electrical stimulation, Spinal manipulation, Spinal mobilization, Cryotherapy, Moist heat, and Manual therapy  PLAN FOR NEXT SESSION:    Perform FGA , and continue with strengthening and balance as necessary.   JGwenlyn Saran PT, DPT Physical Therapist- [facility] 09/23/22, 11:47 AM

## 2022-09-28 ENCOUNTER — Ambulatory Visit: Payer: Medicare Other | Attending: Neurology

## 2022-09-28 DIAGNOSIS — M6281 Muscle weakness (generalized): Secondary | ICD-10-CM | POA: Diagnosis present

## 2022-09-28 DIAGNOSIS — R262 Difficulty in walking, not elsewhere classified: Secondary | ICD-10-CM | POA: Insufficient documentation

## 2022-09-28 NOTE — Therapy (Signed)
OUTPATIENT PHYSICAL THERAPY NEURO TREATMENT   Patient Name: Brian Callahan MRN: [id number] DOB:10-31-1940, 82 y.o., male Today's Date: 09/28/2022   PCP: [name], MD REFERRING PROVIDER: [name], MD  END OF SESSION:   PT End of Session - 09/28/22 1141     Visit Number 13    Number of Visits 24    Date for PT Re-Evaluation 10/26/22    PT Start Time 1147    PT Stop Time 1229    PT Time Calculation (min) 42 min    Equipment Utilized During Treatment Gait belt    Activity Tolerance Patient tolerated treatment well    Behavior During Therapy WFL for tasks assessed/performed                Past Medical History:  Diagnosis Date   Anemia    pt denies 01/18/20   BP (high blood pressure) 05/23/2015   BPH with obstruction/lower urinary tract symptoms    COPD (chronic obstructive pulmonary disease) ([place])    Diabetes mellitus without complication ([place])    diet controlled   Dyspnea    Encounter for long-term (current) use of other high-risk medications    GERD (gastroesophageal reflux disease)    gastritis   GI bleed    Gout    H/O hernia repair mid-90s   x2   History of elevated PSA    History of Rocky Mountain spotted fever    HLD (hyperlipidemia)    HTN (hypertension)    Hyperglycemia    Hyperuricemia    Incomplete bladder emptying    Macular degeneration    Obesity    Psoriasis    Psoriasis    Urinary frequency    Past Surgical History:  Procedure Laterality Date   APPENDECTOMY     BACK SURGERY     neck and spine 2007   CERVICAL SPINE SURGERY  2007   CHOLECYSTECTOMY N/A 10/18/2017   Procedure: LAPAROSCOPIC CHOLECYSTECTOMY;  Surgeon: [name], MD;  Location: [facility] ORS;  Service: General;  Laterality: N/A;   COLECTOMY  1998   partial, due to obstruction   COLONOSCOPY     lost 10 units of blood   COLONOSCOPY WITH PROPOFOL N/A 09/27/2019   Procedure: COLONOSCOPY WITH PROPOFOL;  Surgeon: [name], [name], MD;  Location: [facility] ENDOSCOPY;   Service: Gastroenterology;  Laterality: N/A;   CYSTOSCOPY WITH INSERTION OF UROLIFT N/A 01/23/2020   Procedure: CYSTOSCOPY WITH INSERTION OF UROLIFT;  Surgeon: [name], MD;  Location: [facility] ORS;  Service: Urology;  Laterality: N/A;   ESOPHAGOGASTRODUODENOSCOPY N/A 10/14/2016   Procedure: ESOPHAGOGASTRODUODENOSCOPY (EGD);  Surgeon: [name], MD;  Location: [facility] ENDOSCOPY;  Service: Endoscopy;  Laterality: N/A;   ESOPHAGOGASTRODUODENOSCOPY (EGD) WITH PROPOFOL N/A 09/27/2019   Procedure: ESOPHAGOGASTRODUODENOSCOPY (EGD) WITH PROPOFOL;  Surgeon: [name], [name], MD;  Location: [facility] ENDOSCOPY;  Service: Gastroenterology;  Laterality: N/A;   EYE SURGERY     bilateral cataracts   HERNIA REPAIR  mid-90s   x2  inguinal   PROSTATE BIOPSY  2013   SURGERY SCROTAL / TESTICULAR     for fibrous growth   Patient Active Problem List   Diagnosis Date Noted   Diabetes mellitus type 2 with complications ([place]) 0000000   Chest pain with moderate risk for cardiac etiology 11/29/2017   Positive cardiac stress test 11/29/2017   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 11/08/2017   Esophagitis, reflux 11/08/2017   SOB (shortness of breath) on exertion 07/02/2017   BPH with obstruction/lower urinary tract symptoms  11/21/2015   History of elevated PSA 11/21/2015   H/O gastric ulcer 09/05/2015   Disorder of eyeball 05/23/2015   H/O infectious disease 05/23/2015   Blood glucose elevated 05/23/2015   BP (high blood pressure) 05/23/2015   HLD (hyperlipidemia) 05/23/2015   Psoriasis 05/23/2015   Absolute anemia 01/02/2014   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   Elevated prostate specific antigen (PSA) 01/02/2014   Elevated blood uric acid level 01/02/2014   Enlarged prostate without lower urinary tract symptoms (luts) 01/02/2014   Bleeding gastrointestinal 04/15/2012    ONSET DATE: ~10 years  REFERRING DIAG:  R26.89 (ICD-10-CM) - Balance problem  R26.9 (ICD-10-CM) - Gait abnormality     THERAPY DIAG:  Difficulty in walking, not elsewhere classified  Muscle weakness (generalized)  Rationale for Evaluation and Treatment: Rehabilitation   SUBJECTIVE:                                                                                                                                                                                             SUBJECTIVE STATEMENT:  Pt reports he finally got a haircut, but this time he had to pay for it.  His wife has been the one who primarily cuts it.     Pt accompanied by: self  PERTINENT HISTORY:   Pt is 82 y.o. male who has experienced a decline in his overall functional mobility and stability since Covid and being unable to go back to the gym.  Pt notes that he has noticed it being increasingly difficult performing tasks without losing his balance and just feels generalized weakness.  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    PAIN: Are you having pain? No  PRECAUTIONS: None  WEIGHT BEARING RESTRICTIONS: No  FALLS: Has patient fallen in last 6 months? No  LIVING ENVIRONMENT: Lives with: lives with their spouse Lives in: House/apartment Stairs: Yes: External: 4 steps; on right going up Has following equipment at home: Single point cane, Walker - 2 wheeled, and Grab bars  PLOF: Independent; pt does state he has difficulty tying shoes, but he has slip on shoes that assist with that.  PATIENT GOALS: Pt wants to be more mobile and be able to exercise.  OBJECTIVE:   DIAGNOSTIC FINDINGS:   EXAM 05/29/22: MRI HEAD WITHOUT CONTRAST  IMPRESSION: 1. No acute intracranial abnormality. 2. 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 3. Small chronic right cerebellar infarct.  COGNITION: Overall cognitive status: Within functional limits  for tasks  assessed   SENSATION: WFL  COORDINATION: WFL  POSTURE: rounded shoulders and forward head   LOWER EXTREMITY MMT:    MMT Right Eval Left Eval  Hip flexion 5 4+  Hip abduction 4+ 4+  Hip adduction 4+ 4+  Knee flexion 5 4+  Knee extension 5 4+  Ankle dorsiflexion 5 5  (Blank rows = not tested)   TRANSFERS: Assistive device utilized: Single point cane  Sit to stand: Modified independence Stand to sit: Modified independence Chair to chair: Modified independence   GAIT: Gait pattern: step through pattern, decreased arm swing- Right, decreased arm swing- Left, decreased stride length, and lateral lean- Left Distance walked: 2' Assistive device utilized: Single point cane Level of assistance: Modified independence with AD Comments: Pt able to ambulate fair with some abnormalities in his gait pattern.  FUNCTIONAL TESTS:  5 times sit to stand: 11.81 Timed up and go (TUG): 12.98 6 minute walk test: Not performed at evaluation 10 meter walk test: 0.84 m/s Berg Balance Scale: 47  PATIENT SURVEYS:  FOTO 58/58  TODAY'S TREATMENT: DATE: 09/28/22   TherEx:  Seated hip adduction into physioball, 5 sec holds, 2x15 Seated LAQ with rainbow physioball between knees, 2x15 Forward lunges onto 12" box, x15  Lateral lunges onto 12" box, x15, unable to complete on the R side due to pain in the L hip Seated marches, x15 each LE  Manual:  Supine long arc distraction applied to the L hip, x4 minutes  Hooklying lateral distraction with use of mobilization belt, 30 sec bouts x8  Prone quad stretch, 30 sec bouts x4 Prone PA mobilization to L hip for increased ROM and pain modulation, Grade III, 30 sec bouts STM with TP release to gluteal region/ITB for pain modulation, x4 minutes   PATIENT EDUCATION: Education details: Pt educated on role of PT and services provided during current POC, along with prognosis and information about the clinic.  Person educated: Patient Education  method: Explanation Education comprehension: verbalized understanding  HOME EXERCISE PROGRAM:  Access Code: [email or web address] URL: [email or web address] Date: 08/17/2022 Prepared by: [place] with Resistance  - 2 x daily - 7 x weekly - 2 sets - 10 reps - Sidelying Reverse Clamshell with Resistance  - 2 x daily - 7 x weekly - 2 sets - 10 reps - Prone Hip Extension with Resistance Loop  - 2 x daily - 7 x weekly - 2 sets - 10 reps - Supine Bridge with Resistance Band  - 2 x daily - 7 x weekly - 2 sets - 10 reps - Side Stepping with Resistance at Thighs  - 2 x daily - 7 x weekly - 2 sets - 10 reps  GOALS:  Goals reviewed with patient? Yes  SHORT TERM GOALS: Target date: 08/31/2022  Pt will be independent with HEP in order to demonstrate increased ability to perform tasks related to occupation/hobbies. Baseline: Pt given HEP 08/13/22 and noted above Goal status: INITIAL   LONG TERM GOALS: Target date: 10/26/2022  1.  Patient (> 74 years old) will complete five times sit to stand test in < 10 seconds indicating an increased LE strength and improved balance. Baseline: 11.81 sec 09/16/22: Not attempted Goal status: INITIAL  2.  Patient will increase FOTO score to equal to or greater than 64 to demonstrate statistically significant improvement in mobility and quality of life.  Baseline: FOTO - 58 09/16/22: 56 Goal status: IN PROGRESS   3.  Patient will  increase Berg Balance score by > 6 points to demonstrate decreased fall risk during functional activities. Baseline: 47 09/16/22: 54 Goal status: MET   4.  Patient will reduce timed up and go to <11 seconds to reduce fall risk and demonstrate improved transfer/gait ability. Baseline: 12.98 sec 09/16/22: 11.96 sec Goal status: IN PROGRESS  5.  Patient will increase 10 meter walk test to >1.64ms as to improve gait speed for better community ambulation and to reduce fall risk. Baseline: 11.95 sec/0.84  m/s 09/16/22: 9.68 sec/1.033 m/s Goal status: MET  6.  Patient will increase six minute walk test distance to >1000 for progression to community ambulator and improve gait ability Baseline[facility]: 917 ft with no AD Goal status: IN PROGRESS   7.  Patient will increase FGA score by > 4 points to demonstrate decreased fall risk during functional activities. Baseline:  to do at next visit Goal status: INITIAL   ASSESSMENT:  CLINICAL IMPRESSION:  Pt still limited during the treatment session today due to the pain in the L hip.  Pt unable to perform some of the exercises as noted above due to the pain in the L hip.  Pt does report some relief with the manual therapy approaches, however does not last for prolonged period of time.  Pt notes the pain to be catching at times when ambulating which caused him to nearly fall occasionally.  Pt always able to self-correct.   Pt will continue to benefit from skilled therapy to address remaining deficits in order to improve overall QoL and return to PLOF.       OBJECTIVE IMPAIRMENTS: Abnormal gait, decreased activity tolerance, decreased balance, decreased endurance, decreased mobility, difficulty walking, decreased strength, and decreased safety awareness.   ACTIVITY LIMITATIONS: carrying, lifting, bending, sitting, squatting, and locomotion level  PARTICIPATION LIMITATIONS: community activity and yard work  PERSONAL FACTORS: Age, Fitness, Past/current experiences, Time since onset of injury/illness/exacerbation, and 3+ comorbidities: DM2, Psoriasis, Prostate Cancer, GI complications, SOB on exertion  are also affecting patient's functional outcome.   REHAB POTENTIAL: Fair pt has significant influx of comorbidities that will likely impact their potential progress with PT.  CLINICAL DECISION MAKING: Stable/uncomplicated  EVALUATION COMPLEXITY: Moderate  PLAN:  PT FREQUENCY: 2x/week  PT DURATION: 12 weeks  PLANNED  INTERVENTIONS: Therapeutic exercises, Therapeutic activity, Neuromuscular re-education, Balance training, Gait training, Patient/Family education, Self Care, Joint mobilization, Joint manipulation, Stair training, Vestibular training, Canalith repositioning, Dry Needling, Electrical stimulation, Spinal manipulation, Spinal mobilization, Cryotherapy, Moist heat, and Manual therapy  PLAN FOR NEXT SESSION:   Perform FGA if hip is cooperating , and continue with strengthening and balance as necessary.   JGwenlyn Saran PT, DPT Physical Therapist- [facility] 09/28/22, 1:43 PM

## 2022-09-30 ENCOUNTER — Ambulatory Visit: Payer: Medicare Other | Admitting: Physical Therapy

## 2022-09-30 DIAGNOSIS — M6281 Muscle weakness (generalized): Secondary | ICD-10-CM

## 2022-09-30 DIAGNOSIS — R262 Difficulty in walking, not elsewhere classified: Secondary | ICD-10-CM | POA: Diagnosis not present

## 2022-09-30 NOTE — Therapy (Signed)
OUTPATIENT PHYSICAL THERAPY NEURO TREATMENT   Patient Name: Brian Callahan MRN: [id number] DOB:11/13/1940, 82 y.o., male Today's Date: 09/30/2022   PCP: [name], MD REFERRING PROVIDER: [name], MD  END OF SESSION:   PT End of Session - 09/30/22 1232     Visit Number 14    Number of Visits 24    Date for PT Re-Evaluation 10/26/22    PT Start Time 1150    PT Stop Time 1230    PT Time Calculation (min) 40 min    Equipment Utilized During Treatment Gait belt    Activity Tolerance Patient tolerated treatment well    Behavior During Therapy WFL for tasks assessed/performed                 Past Medical History:  Diagnosis Date   Anemia    pt denies 01/18/20   BP (high blood pressure) 05/23/2015   BPH with obstruction/lower urinary tract symptoms    COPD (chronic obstructive pulmonary disease) ([facility])    Diabetes mellitus without complication ([place])    diet controlled   Dyspnea    Encounter for long-term (current) use of other high-risk medications    GERD (gastroesophageal reflux disease)    gastritis   GI bleed    Gout    H/O hernia repair mid-90s   x2   History of elevated PSA    History of [place] spotted fever    HLD (hyperlipidemia)    HTN (hypertension)    Hyperglycemia    Hyperuricemia    Incomplete bladder emptying    Macular degeneration    Obesity    Psoriasis    Psoriasis    Urinary frequency    Past Surgical History:  Procedure Laterality Date   APPENDECTOMY     BACK SURGERY     neck and spine 2007   CERVICAL SPINE SURGERY  2007   CHOLECYSTECTOMY N/A 10/18/2017   Procedure: LAPAROSCOPIC CHOLECYSTECTOMY;  Surgeon: [name], MD;  Location: [facility] ORS;  Service: General;  Laterality: N/A;   COLECTOMY  1998   partial, due to obstruction   COLONOSCOPY     lost 10 units of blood   COLONOSCOPY WITH PROPOFOL N/A 09/27/2019   Procedure: COLONOSCOPY WITH PROPOFOL;  Surgeon: [name], [name], MD;  Location: [facility]  ENDOSCOPY;  Service: Gastroenterology;  Laterality: N/A;   CYSTOSCOPY WITH INSERTION OF UROLIFT N/A 01/23/2020   Procedure: CYSTOSCOPY WITH INSERTION OF UROLIFT;  Surgeon: [name], MD;  Location: [facility] ORS;  Service: Urology;  Laterality: N/A;   ESOPHAGOGASTRODUODENOSCOPY N/A 10/14/2016   Procedure: ESOPHAGOGASTRODUODENOSCOPY (EGD);  Surgeon: [name], MD;  Location[facility] ENDOSCOPY;  Service: Endoscopy;  Laterality: N/A;   ESOPHAGOGASTRODUODENOSCOPY (EGD) WITH PROPOFOL N/A 09/27/2019   Procedure: ESOPHAGOGASTRODUODENOSCOPY (EGD) WITH PROPOFOL;  Surgeon: [name], [name], MD;  Location: [facility] ENDOSCOPY;  Service: Gastroenterology;  Laterality: N/A;   EYE SURGERY     bilateral cataracts   HERNIA REPAIR  mid-90s   x2  inguinal   PROSTATE BIOPSY  2013   SURGERY SCROTAL / TESTICULAR     for fibrous growth   Patient Active Problem List   Diagnosis Date Noted   Diabetes mellitus type 2 with complications ([place]) 0000000   Chest pain with moderate risk for cardiac etiology 11/29/2017   Positive cardiac stress test 11/29/2017   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 11/08/2017   Esophagitis, reflux 11/08/2017   SOB (shortness of breath) on exertion 07/02/2017   BPH with obstruction/lower urinary tract  symptoms 11/21/2015   History of elevated PSA 11/21/2015   H/O gastric ulcer 09/05/2015   Disorder of eyeball 05/23/2015   H/O infectious disease 05/23/2015   Blood glucose elevated 05/23/2015   BP (high blood pressure) 05/23/2015   HLD (hyperlipidemia) 05/23/2015   Psoriasis 05/23/2015   Absolute anemia 01/02/2014   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   Elevated prostate specific antigen (PSA) 01/02/2014   Elevated blood uric acid level 01/02/2014   Enlarged prostate without lower urinary tract symptoms (luts) 01/02/2014   Bleeding gastrointestinal 04/15/2012    ONSET DATE: ~10 years  REFERRING DIAG:  R26.89 (ICD-10-CM) - Balance problem  R26.9 (ICD-10-CM) - Gait  abnormality    THERAPY DIAG:  Difficulty in walking, not elsewhere classified  Muscle weakness (generalized)  Rationale for Evaluation and Treatment: Rehabilitation   SUBJECTIVE:                                                                                                                                                                                             SUBJECTIVE STATEMENT:  Pt reports mild pain in the LLE in the Hip and Knee. Pt reports new/worsening swelling on BLE over the last couple days. Pt reports deceased pain in the L hip following treatment session   Pt accompanied by: self  PERTINENT HISTORY:   Pt is 82 y.o. male who has experienced a decline in his overall functional mobility and stability since Covid and being unable to go back to the gym.  Pt notes that he has noticed it being increasingly difficult performing tasks without losing his balance and just feels generalized weakness.  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    PAIN: Are you having pain? No  PRECAUTIONS: None  WEIGHT BEARING RESTRICTIONS: No  FALLS: Has patient fallen in last 6 months? No  LIVING ENVIRONMENT: Lives with: lives with their spouse Lives in: House/apartment Stairs: Yes: External: 4 steps; on right going up Has following equipment at home: Single point cane, Walker - 2 wheeled, and Grab bars  PLOF: Independent; pt does state he has difficulty tying shoes, but he has slip on shoes that assist with that.  PATIENT GOALS: Pt wants to be more mobile and be able to exercise.  OBJECTIVE:   DIAGNOSTIC FINDINGS:   EXAM 05/29/22: MRI HEAD WITHOUT CONTRAST  IMPRESSION: 1. No acute intracranial abnormality. 2. 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 3. Small chronic right cerebellar  infarct.  COGNITION: Overall cognitive status: Within functional limits for tasks assessed   SENSATION: WFL  COORDINATION: WFL  POSTURE: rounded shoulders and forward head   LOWER EXTREMITY MMT:    MMT Right Eval Left Eval  Hip flexion 5 4+  Hip abduction 4+ 4+  Hip adduction 4+ 4+  Knee flexion 5 4+  Knee extension 5 4+  Ankle dorsiflexion 5 5  (Blank rows = not tested)   TRANSFERS: Assistive device utilized: Single point cane  Sit to stand: Modified independence Stand to sit: Modified independence Chair to chair: Modified independence   GAIT: Gait pattern: step through pattern, decreased arm swing- Right, decreased arm swing- Left, decreased stride length, and lateral lean- Left Distance walked: 24' Assistive device utilized: Single point cane Level of assistance: Modified independence with AD Comments: Pt able to ambulate fair with some abnormalities in his gait pattern.  FUNCTIONAL TESTS:  5 times sit to stand: 11.81 Timed up and go (TUG): 12.98 6 minute walk test: Not performed at evaluation 10 meter walk test: 0.84 m/s Berg Balance Scale: 47  PATIENT SURVEYS:  FOTO 58/58  TODAY'S TREATMENT: DATE: 09/30/22   TherEx:  Supine SLR x 12  Supine clam shell x 12 GTB with slow eccentric movement.  Bridge x 12 with 3 sec hold  Sidelying hip ER with GTB x 12 BLE  Forward lunge on 9 inch step for gluteal/hip flexor stretch 2 x 35 sec bil  Instruction from PT for decreased speed and hold at end range.   Manual:  Supine long arc distraction applied to the L hip, 5 x 1 min neutral abduction x 2 bout and ~20deg abduction x 3 bouts   STM to L side glute max and piriformis as well as TFL/ITB; TP release performed throughout muscle tissue as needed.   PATIENT EDUCATION: Education details: Pt educated on role of PT and services provided during current POC, along with prognosis and information about the clinic.  Person educated: Patient Education method:  Explanation Education comprehension: verbalized understanding  HOME EXERCISE PROGRAM:  Access Code: [email or web address] URL: [email or web address] Date: 08/17/2022 Prepared by: [place] with Resistance  - 2 x daily - 7 x weekly - 2 sets - 10 reps - Sidelying Reverse Clamshell with Resistance  - 2 x daily - 7 x weekly - 2 sets - 10 reps - Prone Hip Extension with Resistance Loop  - 2 x daily - 7 x weekly - 2 sets - 10 reps - Supine Bridge with Resistance Band  - 2 x daily - 7 x weekly - 2 sets - 10 reps - Side Stepping with Resistance at Thighs  - 2 x daily - 7 x weekly - 2 sets - 10 reps  GOALS:  Goals reviewed with patient? Yes  SHORT TERM GOALS: Target date: 08/31/2022  Pt will be independent with HEP in order to demonstrate increased ability to perform tasks related to occupation/hobbies. Baseline: Pt given HEP 08/13/22 and noted above Goal status: INITIAL   LONG TERM GOALS: Target date: 10/26/2022  1.  Patient (> 57 years old) will complete five times sit to stand test in < 10 seconds indicating an increased LE strength and improved balance. Baseline: 11.81 sec 09/16/22: Not attempted Goal status: INITIAL  2.  Patient will increase FOTO score to equal to or greater than 64 to demonstrate statistically significant improvement in mobility and quality of life.  Baseline: FOTO - 58 09/16/22: 56 Goal status: IN PROGRESS   3.  Patient will increase Berg Balance score by > 6 points to demonstrate decreased fall risk during functional activities. Baseline: 47 09/16/22: 54 Goal status: MET   4.  Patient will reduce timed up and go to <11 seconds to reduce fall risk and demonstrate improved transfer/gait ability. Baseline: 12.98 sec 09/16/22: 11.96 sec Goal status: IN PROGRESS  5.  Patient will increase 10 meter walk test to >1.34ms as to improve gait speed for better community ambulation and to reduce fall risk. Baseline: 11.95 sec/0.84 m/s 09/16/22:  9.68 sec/1.033 m/s Goal status: MET  6.  Patient will increase six minute walk test distance to >1000 for progression to community ambulator and improve gait ability [facility]: 917 ft with no AD Goal status: IN PROGRESS   7.  Patient will increase FGA score by > 4 points to demonstrate decreased fall risk during functional activities. Baseline:  to do at next visit Goal status: INITIAL   ASSESSMENT:  CLINICAL IMPRESSION:  Pt limited due to L hip pain on this day, but mild improvement from prior session. PT treatment focused on manual therapy and strengthening L hip for pain modulation. Pt reports improved ROM and decreased pain the L hip upon completion of PT treatment on this day. Will attempt to redirect treatment focus to strengthening if L hip pain has subsided on subsequent sessions.    Pt will continue to benefit from skilled therapy to address remaining deficits in order to improve overall QoL and return to PLOF.       OBJECTIVE IMPAIRMENTS: Abnormal gait, decreased activity tolerance, decreased balance, decreased endurance, decreased mobility, difficulty walking, decreased strength, and decreased safety awareness.   ACTIVITY LIMITATIONS: carrying, lifting, bending, sitting, squatting, and locomotion level  PARTICIPATION LIMITATIONS: community activity and yard work  PERSONAL FACTORS: Age, Fitness, Past/current experiences, Time since onset of injury/illness/exacerbation, and 3+ comorbidities: DM2, Psoriasis, Prostate Cancer, GI complications, SOB on exertion  are also affecting patient's functional outcome.   REHAB POTENTIAL: Fair pt has significant influx of comorbidities that will likely impact their potential progress with PT.  CLINICAL DECISION MAKING: Stable/uncomplicated  EVALUATION COMPLEXITY: Moderate  PLAN:  PT FREQUENCY: 2x/week  PT DURATION: 12 weeks  PLANNED INTERVENTIONS: Therapeutic exercises, Therapeutic activity, Neuromuscular  re-education, Balance training, Gait training, Patient/Family education, Self Care, Joint mobilization, Joint manipulation, Stair training, Vestibular training, Canalith repositioning, Dry Needling, Electrical stimulation, Spinal manipulation, Spinal mobilization, Cryotherapy, Moist heat, and Manual therapy  PLAN FOR NEXT SESSION:   Perform FGA if hip is cooperating , and continue with strengthening and balance as necessary.  ABarrie FolkPT, DPT  Physical Therapist - [facility] 12:38 PM 09/30/22

## 2022-10-05 ENCOUNTER — Ambulatory Visit: Payer: Medicare Other | Admitting: Physical Therapy

## 2022-10-05 DIAGNOSIS — M6281 Muscle weakness (generalized): Secondary | ICD-10-CM

## 2022-10-05 DIAGNOSIS — R262 Difficulty in walking, not elsewhere classified: Secondary | ICD-10-CM

## 2022-10-05 NOTE — Therapy (Signed)
OUTPATIENT PHYSICAL THERAPY NEURO TREATMENT   Patient Name: WAREN Callahan MRN: [id number] DOB:1941/07/15, 82 y.o., male Today's Date: 10/05/2022   PCP: [name], MD REFERRING PROVIDER: [name], MD  END OF SESSION:   PT End of Session - 10/05/22 1156     Visit Number 15    Number of Visits 24    Date for PT Re-Evaluation 10/26/22    PT Start Time [id number]    PT Stop Time 1234    PT Time Calculation (min) 40 min    Equipment Utilized During Treatment Gait belt    Activity Tolerance Patient tolerated treatment well    Behavior During Therapy WFL for tasks assessed/performed                  Past Medical History:  Diagnosis Date   Anemia    pt denies 01/18/20   BP (high blood pressure) 05/23/2015   BPH with obstruction/lower urinary tract symptoms    COPD (chronic obstructive pulmonary disease) ([place])    Diabetes mellitus without complication ([place])    diet controlled   Dyspnea    Encounter for long-term (current) use of other high-risk medications    GERD (gastroesophageal reflux disease)    gastritis   GI bleed    Gout    H/O hernia repair mid-90s   x2   History of elevated PSA    History of [place] spotted fever    HLD (hyperlipidemia)    HTN (hypertension)    Hyperglycemia    Hyperuricemia    Incomplete bladder emptying    Macular degeneration    Obesity    Psoriasis    Psoriasis    Urinary frequency    Past Surgical History:  Procedure Laterality Date   APPENDECTOMY     BACK SURGERY     neck and spine 2007   CERVICAL SPINE SURGERY  2007   CHOLECYSTECTOMY N/A 10/18/2017   Procedure: LAPAROSCOPIC CHOLECYSTECTOMY;  Surgeon: [name], MD;  Location: [facility] ORS;  Service: General;  Laterality: N/A;   COLECTOMY  1998   partial, due to obstruction   COLONOSCOPY     lost 10 units of blood   COLONOSCOPY WITH PROPOFOL N/A 09/27/2019   Procedure: COLONOSCOPY WITH PROPOFOL;  Surgeon: [name], [name], MD;  Location: [facility]  ENDOSCOPY;  Service: Gastroenterology;  Laterality: N/A;   CYSTOSCOPY WITH INSERTION OF UROLIFT N/A 01/23/2020   Procedure: CYSTOSCOPY WITH INSERTION OF UROLIFT;  Surgeon: [name], MD;  Location: [facility] ORS;  Service: Urology;  Laterality: N/A;   ESOPHAGOGASTRODUODENOSCOPY N/A 10/14/2016   Procedure: ESOPHAGOGASTRODUODENOSCOPY (EGD);  Surgeon: [name], MD;  Location: [facility] ENDOSCOPY;  Service: Endoscopy;  Laterality: N/A;   ESOPHAGOGASTRODUODENOSCOPY (EGD) WITH PROPOFOL N/A 09/27/2019   Procedure: ESOPHAGOGASTRODUODENOSCOPY (EGD) WITH PROPOFOL;  Surgeon: [name], [name], MD;  Location: [facility] ENDOSCOPY;  Service: Gastroenterology;  Laterality: N/A;   EYE SURGERY     bilateral cataracts   HERNIA REPAIR  mid-90s   x2  inguinal   PROSTATE BIOPSY  2013   SURGERY SCROTAL / TESTICULAR     for fibrous growth   Patient Active Problem List   Diagnosis Date Noted   Diabetes mellitus type 2 with complications ([place]) 0000000   Chest pain with moderate risk for cardiac etiology 11/29/2017   Positive cardiac stress test 11/29/2017   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 11/08/2017   Esophagitis, reflux 11/08/2017   SOB (shortness of breath) on exertion 07/02/2017   BPH with obstruction/lower urinary  tract symptoms 11/21/2015   History of elevated PSA 11/21/2015   H/O gastric ulcer 09/05/2015   Disorder of eyeball 05/23/2015   H/O infectious disease 05/23/2015   Blood glucose elevated 05/23/2015   BP (high blood pressure) 05/23/2015   HLD (hyperlipidemia) 05/23/2015   Psoriasis 05/23/2015   Absolute anemia 01/02/2014   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   Elevated prostate specific antigen (PSA) 01/02/2014   Elevated blood uric acid level 01/02/2014   Enlarged prostate without lower urinary tract symptoms (luts) 01/02/2014   Bleeding gastrointestinal 04/15/2012    ONSET DATE: ~10 years  REFERRING DIAG:  R26.89 (ICD-10-CM) - Balance problem  R26.9 (ICD-10-CM) - Gait  abnormality    THERAPY DIAG:  Difficulty in walking, not elsewhere classified  Muscle weakness (generalized)  Rationale for Evaluation and Treatment: Rehabilitation   SUBJECTIVE:                                                                                                                                                                                             SUBJECTIVE STATEMENT:  Pt reports that L hip is feeling better than It was last week. Pain now has been present in L shoulder off and on for the approx 2 weeks. Pt states that he has an MD appointment in the near future where he reports that he will request xrays if pain continues.   Pt accompanied by: self  PERTINENT HISTORY:   Pt is 82 y.o. male who has experienced a decline in his overall functional mobility and stability since Covid and being unable to go back to the gym.  Pt notes that he has noticed it being increasingly difficult performing tasks without losing his balance and just feels generalized weakness.  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    PAIN: Are you having pain? No  PRECAUTIONS: None  WEIGHT BEARING RESTRICTIONS: No  FALLS: Has patient fallen in last 6 months? No  LIVING ENVIRONMENT: Lives with: lives with their spouse Lives in: House/apartment Stairs: Yes: External: 4 steps; on right going up Has following equipment at home: Single point cane, Walker - 2 wheeled, and Grab bars  PLOF: Independent; pt does state he has difficulty tying shoes, but he has slip on shoes that assist with that.  PATIENT GOALS: Pt wants to be more mobile and be able to exercise.  OBJECTIVE:   DIAGNOSTIC FINDINGS:   EXAM 05/29/22: MRI HEAD WITHOUT CONTRAST  IMPRESSION: 1. No acute intracranial  abnormality. 2. 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 3. Small chronic right cerebellar infarct.  COGNITION: Overall cognitive status: Within functional limits for tasks assessed   SENSATION: WFL  COORDINATION: WFL  POSTURE: rounded shoulders and forward head   LOWER EXTREMITY MMT:    MMT Right Eval Left Eval  Hip flexion 5 4+  Hip abduction 4+ 4+  Hip adduction 4+ 4+  Knee flexion 5 4+  Knee extension 5 4+  Ankle dorsiflexion 5 5  (Blank rows = not tested)   TRANSFERS: Assistive device utilized: Single point cane  Sit to stand: Modified independence Stand to sit: Modified independence Chair to chair: Modified independence   GAIT: Gait pattern: step through pattern, decreased arm swing- Right, decreased arm swing- Left, decreased stride length, and lateral lean- Left Distance walked: 37' Assistive device utilized: Single point cane Level of assistance: Modified independence with AD Comments: Pt able to ambulate fair with some abnormalities in his gait pattern.  FUNCTIONAL TESTS:  5 times sit to stand: 11.81 Timed up and go (TUG): 12.98 6 minute walk test: Not performed at evaluation 10 meter walk test: 0.84 m/s Berg Balance Scale: 47 Functional gait assessment: 15 on 3/11    [facility] PT Assessment - 10/05/22 0001       Functional Gait  Assessment   Gait Level Surface Walks 20 ft in less than 7 sec but greater than 5.5 sec, uses assistive device, slower speed, mild gait deviations, or deviates 6-10 in outside of the 12 in walkway width.    Change in Gait Speed Able to change speed, demonstrates mild gait deviations, deviates 6-10 in outside of the 12 in walkway width, or no gait deviations, unable to achieve a major change in velocity, or uses a change in velocity, or uses an assistive device.    Gait with Horizontal Head Turns Performs head turns smoothly with slight change in gait velocity (eg, minor disruption to smooth gait path), deviates 6-10 in outside 12 in walkway width, or uses an assistive  device.    Gait with Vertical Head Turns Performs task with moderate change in gait velocity, slows down, deviates 10-15 in outside 12 in walkway width but recovers, can continue to walk.    Gait and Pivot Turn Pivot turns safely in greater than 3 sec and stops with no loss of balance, or pivot turns safely within 3 sec and stops with mild imbalance, requires small steps to catch balance.    Step Over Obstacle Is able to step over one shoe box (4.5 in total height) but must slow down and adjust steps to clear box safely. May require verbal cueing.    Gait with Narrow Base of Support Ambulates less than 4 steps heel to toe or cannot perform without assistance.    Gait with Eyes Closed Walks 20 ft, uses assistive device, slower speed, mild gait deviations, deviates 6-10 in outside 12 in walkway width. Ambulates 20 ft in less than 9 sec but greater than 7 sec.    Ambulating Backwards Walks 20 ft, uses assistive device, slower speed, mild gait deviations, deviates 6-10 in outside 12 in walkway width.    Steps Two feet to a stair, must use rail.    Total Score 15             PATIENT SURVEYS:  FOTO 58/58  TODAY'S TREATMENT: DATE: 10/05/22   Gait training without AD x 53f with supervision assist. Antalgic gait pattern on the L side  throughout, but pt reports pain in reduced from prior PT treatments.   PT instructed pt in FGA Patient demonstrates increased fall risk as noted by score of 15/30 on  Functional Gait Assessment.   <22/30 = predictive of falls, <20/30 = fall in 6 months, <18/30 = predictive of falls in PD MCID: 5 points stroke population, 4 points geriatric population (ANPTA Core Set of Outcome Measures for Adults with Neurologic Conditions, 2018) Pt required multiple seated rest breaks due to pain in the LLE.    TherEx:  Seated hip abduction x 12, GTB Seated hip flexion x 12 BLE, GTB Hip extension to press down through foot x 12 GTB Instruction for full ROM and decreased  speed of eccentric movement.   PATIENT EDUCATION: Education details: Pt educated on role of PT and services provided during current POC, along with prognosis and information about the clinic.  Person educated: Patient Education method: Explanation Education comprehension: verbalized understanding  HOME EXERCISE PROGRAM:  Access Code: [email or web address] URL: [email or web address] Date: 08/17/2022 Prepared by: [place] with Resistance  - 2 x daily - 7 x weekly - 2 sets - 10 reps - Sidelying Reverse Clamshell with Resistance  - 2 x daily - 7 x weekly - 2 sets - 10 reps - Prone Hip Extension with Resistance Loop  - 2 x daily - 7 x weekly - 2 sets - 10 reps - Supine Bridge with Resistance Band  - 2 x daily - 7 x weekly - 2 sets - 10 reps - Side Stepping with Resistance at Thighs  - 2 x daily - 7 x weekly - 2 sets - 10 reps  GOALS:  Goals reviewed with patient? Yes  SHORT TERM GOALS: Target date: 08/31/2022  Pt will be independent with HEP in order to demonstrate increased ability to perform tasks related to occupation/hobbies. Baseline: Pt given HEP 08/13/22 and noted above Goal status: INITIAL   LONG TERM GOALS: Target date: 10/26/2022  1.  Patient (> 84 years old) will complete five times sit to stand test in < 10 seconds indicating an increased LE strength and improved balance. Baseline: 11.81 sec 09/16/22: Not attempted Goal status: INITIAL  2.  Patient will increase FOTO score to equal to or greater than 64 to demonstrate statistically significant improvement in mobility and quality of life.  Baseline: FOTO - 58 09/16/22: 56 Goal status: IN PROGRESS   3.  Patient will increase Berg Balance score by > 6 points to demonstrate decreased fall risk during functional activities. Baseline: 47 09/16/22: 54 Goal status: MET   4.  Patient will reduce timed up and go to <11 seconds to reduce fall risk and demonstrate improved transfer/gait  ability. Baseline: 12.98 sec 09/16/22: 11.96 sec Goal status: IN PROGRESS  5.  Patient will increase 10 meter walk test to >1.61ms as to improve gait speed for better community ambulation and to reduce fall risk. Baseline: 11.95 sec/0.84 m/s 09/16/22: 9.68 sec/1.033 m/s Goal status: MET  6.  Patient will increase six minute walk test distance to >1000 for progression to community ambulator and improve gait ability [facility] 917 ft with no AD Goal status: IN PROGRESS   7.  Patient will increase FGA score by > 4 points to demonstrate decreased fall risk during functional activities. Baseline: 15/30 Goal status: INITIAL   ASSESSMENT:  CLINICAL IMPRESSION:  Pt put forth good effort throughout PT treatment, but contiues to be mildly limited by pain in  the L hip.  Pt demonstrates increased fall risk to due to 15/30 on FGA. Able to tolerate strengthening exercises without increased pain on this day.  Pt will continue to benefit from skilled therapy to address remaining deficits in order to improve overall QoL and return to PLOF.       OBJECTIVE IMPAIRMENTS: Abnormal gait, decreased activity tolerance, decreased balance, decreased endurance, decreased mobility, difficulty walking, decreased strength, and decreased safety awareness.   ACTIVITY LIMITATIONS: carrying, lifting, bending, sitting, squatting, and locomotion level  PARTICIPATION LIMITATIONS: community activity and yard work  PERSONAL FACTORS: Age, Fitness, Past/current experiences, Time since onset of injury/illness/exacerbation, and 3+ comorbidities: DM2, Psoriasis, Prostate Cancer, GI complications, SOB on exertion  are also affecting patient's functional outcome.   REHAB POTENTIAL: Fair pt has significant influx of comorbidities that will likely impact their potential progress with PT.  CLINICAL DECISION MAKING: Stable/uncomplicated  EVALUATION COMPLEXITY: Moderate  PLAN:  PT FREQUENCY:  2x/week  PT DURATION: 12 weeks  PLANNED INTERVENTIONS: Therapeutic exercises, Therapeutic activity, Neuromuscular re-education, Balance training, Gait training, Patient/Family education, Self Care, Joint mobilization, Joint manipulation, Stair training, Vestibular training, Canalith repositioning, Dry Needling, Electrical stimulation, Spinal manipulation, Spinal mobilization, Cryotherapy, Moist heat, and Manual therapy  PLAN FOR NEXT SESSION:   Perform FGA if hip is cooperating , and continue with strengthening and balance as necessary.  Barrie Folk PT, DPT  Physical Therapist - [facility]  1:02 PM 10/05/22

## 2022-10-07 ENCOUNTER — Ambulatory Visit: Payer: Medicare Other

## 2022-10-07 DIAGNOSIS — R262 Difficulty in walking, not elsewhere classified: Secondary | ICD-10-CM | POA: Diagnosis not present

## 2022-10-07 NOTE — Therapy (Signed)
OUTPATIENT PHYSICAL THERAPY NEURO TREATMENT   Patient Name: Brian Callahan MRN: [id number] DOB:May 08, 1941, 82 y.o., male Today's Date: 10/07/2022   PCP: [name], MD REFERRING PROVIDER: [name], MD  END OF SESSION:   PT End of Session - 10/07/22 1201     Visit Number 16    Number of Visits 24    Date for PT Re-Evaluation 10/26/22    Authorization Type [employer] Medicare    Progress Note Due on Visit 20    PT Start Time 1153    PT Stop Time 1225    PT Time Calculation (min) 32 min    Equipment Utilized During Treatment Gait belt    Activity Tolerance Patient tolerated treatment well;No increased pain    Behavior During Therapy [facility] for tasks assessed/performed                  Past Medical History:  Diagnosis Date   Anemia    pt denies 01/18/20   BP (high blood pressure) 05/23/2015   BPH with obstruction/lower urinary tract symptoms    COPD (chronic obstructive pulmonary disease) ([place])    Diabetes mellitus without complication (HCC)    diet controlled   Dyspnea    Encounter for long-term (current) use of other high-risk medications    GERD (gastroesophageal reflux disease)    gastritis   GI bleed    Gout    H/O hernia repair mid-90s   x2   History of elevated PSA    History of Rocky Mountain spotted fever    HLD (hyperlipidemia)    HTN (hypertension)    Hyperglycemia    Hyperuricemia    Incomplete bladder emptying    Macular degeneration    Obesity    Psoriasis    Psoriasis    Urinary frequency    Past Surgical History:  Procedure Laterality Date   APPENDECTOMY     BACK SURGERY     neck and spine 2007   CERVICAL SPINE SURGERY  2007   CHOLECYSTECTOMY N/A 10/18/2017   Procedure: LAPAROSCOPIC CHOLECYSTECTOMY;  Surgeon: [name], MD;  Location: [facility] ORS;  Service: General;  Laterality: N/A;   COLECTOMY  1998   partial, due to obstruction   COLONOSCOPY     lost 10 units of blood   COLONOSCOPY WITH PROPOFOL N/A 09/27/2019    Procedure: COLONOSCOPY WITH PROPOFOL;  Surgeon: [name], [name], MD;  Location: [facility] ENDOSCOPY;  Service: Gastroenterology;  Laterality: N/A;   CYSTOSCOPY WITH INSERTION OF UROLIFT N/A 01/23/2020   Procedure: CYSTOSCOPY WITH INSERTION OF UROLIFT;  Surgeon: [name], MD;  Location: [facility] ORS;  Service: Urology;  Laterality: N/A;   ESOPHAGOGASTRODUODENOSCOPY N/A 10/14/2016   Procedure: ESOPHAGOGASTRODUODENOSCOPY (EGD);  Surgeon: [name], MD;  Location: [facility] ENDOSCOPY;  Service: Endoscopy;  Laterality: N/A;   ESOPHAGOGASTRODUODENOSCOPY (EGD) WITH PROPOFOL N/A 09/27/2019   Procedure: ESOPHAGOGASTRODUODENOSCOPY (EGD) WITH PROPOFOL;  Surgeon: [name], [name], MD;  Location: [facility] ENDOSCOPY;  Service: Gastroenterology;  Laterality: N/A;   EYE SURGERY     bilateral cataracts   HERNIA REPAIR  mid-90s   x2  inguinal   PROSTATE BIOPSY  2013   SURGERY SCROTAL / TESTICULAR     for fibrous growth   Patient Active Problem List   Diagnosis Date Noted   Diabetes mellitus type 2 with complications ([place]) 0000000   Chest pain with moderate risk for cardiac etiology 11/29/2017   Positive cardiac stress test 11/29/2017   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 11/08/2017  Esophagitis, reflux 11/08/2017   SOB (shortness of breath) on exertion 07/02/2017   BPH with obstruction/lower urinary tract symptoms 11/21/2015   History of elevated PSA 11/21/2015   H/O gastric ulcer 09/05/2015   Disorder of eyeball 05/23/2015   H/O infectious disease 05/23/2015   Blood glucose elevated 05/23/2015   BP (high blood pressure) 05/23/2015   HLD (hyperlipidemia) 05/23/2015   Psoriasis 05/23/2015   Absolute anemia 01/02/2014   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   Elevated prostate specific antigen (PSA) 01/02/2014   Elevated blood uric acid level 01/02/2014   Enlarged prostate without lower urinary tract symptoms (luts) 01/02/2014   Bleeding gastrointestinal 04/15/2012    ONSET DATE: ~10  years  REFERRING DIAG:  R26.89 (ICD-10-CM) - Balance problem  R26.9 (ICD-10-CM) - Gait abnormality    THERAPY DIAG:  Difficulty in walking, not elsewhere classified  Rationale for Evaluation and Treatment: Rehabilitation   SUBJECTIVE:                                                                                                                                                                                             SUBJECTIVE STATEMENT: Pt doing alright, typical left shoulder pain. Pain in left hip is intermittent when he pivots or steps wrong. Dr. Lanney Gins started him on a new nebulizer medication therapy. HEP has been difficult due to Left hip pain.   Pt accompanied by: self  PERTINENT HISTORY:  Pt is 82 y.o. male who has experienced a decline in his overall functional mobility and stability since Covid and being unable to go back to the gym.  Pt notes that he has noticed it being increasingly difficult performing tasks without losing his balance and just feels generalized weakness.  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    PAIN: Are you having pain? No  PRECAUTIONS: None  WEIGHT BEARING RESTRICTIONS: No  FALLS: Has patient fallen in last 6 months? No  PLOF: Independent; pt does state he has difficulty tying shoes, but he has slip on shoes that assist with that.  PATIENT GOALS: Pt wants to be more mobile and be able to exercise.  OBJECTIVE:  TODAY'S TREATMENT: DATE: 10/07/22  -Nustep x5 minute,s level 1, seat 8, arms 10 SPM in 50s (well tolerated in shoulder and hip)   -Hookllying on plinth LLE LAD 1x2 minutes with mobiliation belt -AA/ROM LLE marcing, AR/ROM LLE extension x10 each  -Left hip Flexion stretch 1x30degrees, FABER stretch 1x30sec (gentle due to  knee intolerance to torque)  -Left knee fulcrum  distraction mobilization x30sec   -STS from elevated surface x10, hands free -standing marching 1x20 alteranting -STS from elevated surface x10, hands free -standing marching 1x20 alteranting   PATIENT EDUCATION: Education details: Pt educated on role of PT and services provided during current POC, along with prognosis and information about the clinic.  Person educated: Patient Education method: Explanation Education comprehension: verbalized understanding  HOME EXERCISE PROGRAM:  Access Code: [email or web address] URL: [email or web address] Date: 08/17/2022 Prepared by: [place] with Resistance  - 2 x daily - 7 x weekly - 2 sets - 10 reps - Sidelying Reverse Clamshell with Resistance  - 2 x daily - 7 x weekly - 2 sets - 10 reps - Prone Hip Extension with Resistance Loop  - 2 x daily - 7 x weekly - 2 sets - 10 reps - Supine Bridge with Resistance Band  - 2 x daily - 7 x weekly - 2 sets - 10 reps - Side Stepping with Resistance at Thighs  - 2 x daily - 7 x weekly - 2 sets - 10 reps  GOALS:  Goals reviewed with patient? Yes  SHORT TERM GOALS: Target date: 08/31/2022  Pt will be independent with HEP in order to demonstrate increased ability to perform tasks related to occupation/hobbies. Baseline: Pt given HEP 08/13/22 and noted above Goal status: INITIAL   LONG TERM GOALS: Target date: 10/26/2022  1.  Patient (> 79 years old) will complete five times sit to stand test in < 10 seconds indicating an increased LE strength and improved balance. Baseline: 11.81 sec 09/16/22: Not attempted Goal status: INITIAL  2.  Patient will increase FOTO score to equal to or greater than 64 to demonstrate statistically significant improvement in mobility and quality of life.  Baseline: FOTO - 58 09/16/22: 56 Goal status: IN PROGRESS   3.  Patient will increase Berg Balance score by > 6 points to demonstrate decreased fall risk during functional  activities. Baseline: 47 09/16/22: 54 Goal status: MET   4.  Patient will reduce timed up and go to <11 seconds to reduce fall risk and demonstrate improved transfer/gait ability. Baseline: 12.98 sec 09/16/22: 11.96 sec Goal status: IN PROGRESS  5.  Patient will increase 10 meter walk test to >1.65ms as to improve gait speed for better community ambulation and to reduce fall risk. Baseline: 11.95 sec/0.84 m/s 09/16/22: 9.68 sec/1.033 m/s Goal status: MET  6.  Patient will increase six minute walk test distance to >1000 for progression to community ambulator and improve gait ability [facility] 917 ft with no AD Goal status: IN PROGRESS   7.  Patient will increase FGA score by > 4 points to demonstrate decreased fall risk during functional activities. Baseline: 15/30 Goal status: INITIAL   ASSESSMENT:  CLINICAL IMPRESSION: Pt tolerates session well today. Manual therapies directed at improving tolerance to session in Left hip and left knee DJD. Pt reports improved pain in both byt end of session. Used this opportunity to partake in weightbearing exercise. Pt Continues to progress toward goals of care overall, but still warrants skilled PT interventions to get the job done.         OBJECTIVE IMPAIRMENTS: Abnormal gait, decreased activity tolerance, decreased balance, decreased endurance, decreased mobility, difficulty walking, decreased strength, and decreased safety awareness.   ACTIVITY LIMITATIONS: carrying, lifting, bending, sitting, squatting, and locomotion level  PARTICIPATION LIMITATIONS: community activity and yard work  PERSONAL FACTORS: Age, Fitness, Past/current experiences, Time since onset of injury/illness/exacerbation, and 3+ comorbidities: DM2, Psoriasis, Prostate Cancer, GI complications, SOB on exertion  are also affecting patient's functional outcome.   REHAB POTENTIAL: Fair pt has significant influx of comorbidities that will likely  impact their potential progress with PT.  CLINICAL DECISION MAKING: Stable/uncomplicated  EVALUATION COMPLEXITY: Moderate  PLAN:  PT FREQUENCY: 2x/week  PT DURATION: 12 weeks  PLANNED INTERVENTIONS: Therapeutic exercises, Therapeutic activity, Neuromuscular re-education, Balance training, Gait training, Patient/Family education, Self Care, Joint mobilization, Joint manipulation, Stair training, Vestibular training, Canalith repositioning, Dry Needling, Electrical stimulation, Spinal manipulation, Spinal mobilization, Cryotherapy, Moist heat, and Manual therapy  PLAN FOR NEXT SESSION:   Perform FGA if hip is cooperating , and continue with strengthening and balance as necessary.  12:04 PM, 10/07/22 Etta Grandchild, PT, DPT Physical Therapist - [place] [phone]      12:04 PM 10/07/22

## 2022-10-12 ENCOUNTER — Ambulatory Visit: Payer: Medicare Other | Admitting: Physical Therapy

## 2022-10-12 DIAGNOSIS — M6281 Muscle weakness (generalized): Secondary | ICD-10-CM

## 2022-10-12 DIAGNOSIS — R262 Difficulty in walking, not elsewhere classified: Secondary | ICD-10-CM | POA: Diagnosis not present

## 2022-10-12 NOTE — Therapy (Signed)
OUTPATIENT PHYSICAL THERAPY NEURO TREATMENT   Patient Name: Brian Callahan MRN: [id number] DOB:[birth year], 82 y.o., male Today's Date: 10/12/2022   PCP: [name], MD REFERRING PROVIDER: [name], MD  END OF SESSION:   PT End of Session - 10/12/22 1156     Visit Number 17    Number of Visits 24    Date for PT Re-Evaluation 10/26/22    Authorization Type UHC Medicare    Progress Note Due on Visit 20    PT Start Time 1151    PT Stop Time 1230    PT Time Calculation (min) 39 min    Equipment Utilized During Treatment Gait belt    Activity Tolerance Patient tolerated treatment well;No increased pain    Behavior During Therapy [facility] for tasks assessed/performed                   Past Medical History:  Diagnosis Date   Anemia    pt denies 01/18/20   BP (high blood pressure) 05/23/2015   BPH with obstruction/lower urinary tract symptoms    COPD (chronic obstructive pulmonary disease) ([place])    Diabetes mellitus without complication (HCC)    diet controlled   Dyspnea    Encounter for long-term (current) use of other high-risk medications    GERD (gastroesophageal reflux disease)    gastritis   GI bleed    Gout    H/O hernia repair mid-90s   x2   History of elevated PSA    History of Rocky Mountain spotted fever    HLD (hyperlipidemia)    HTN (hypertension)    Hyperglycemia    Hyperuricemia    Incomplete bladder emptying    Macular degeneration    Obesity    Psoriasis    Psoriasis    Urinary frequency    Past Surgical History:  Procedure Laterality Date   APPENDECTOMY     BACK SURGERY     neck and spine 2007   CERVICAL SPINE SURGERY  2007   CHOLECYSTECTOMY N/A 10/18/2017   Procedure: LAPAROSCOPIC CHOLECYSTECTOMY;  Surgeon: [name], MD;  Location: [facility] ORS;  Service: General;  Laterality: N/A;   COLECTOMY  1998   partial, due to obstruction   COLONOSCOPY     lost 10 units of blood   COLONOSCOPY WITH PROPOFOL N/A 09/27/2019    Procedure: COLONOSCOPY WITH PROPOFOL;  Surgeon: [name], [name], MD;  Location: [facility] ENDOSCOPY;  Service: Gastroenterology;  Laterality: N/A;   CYSTOSCOPY WITH INSERTION OF UROLIFT N/A 01/23/2020   Procedure: CYSTOSCOPY WITH INSERTION OF UROLIFT;  Surgeon: [name], MD;  Location: [facility] ORS;  Service: Urology;  Laterality: N/A;   ESOPHAGOGASTRODUODENOSCOPY N/A 10/14/2016   Procedure: ESOPHAGOGASTRODUODENOSCOPY (EGD);  Surgeon: [name], MD;  Location: [facility] ENDOSCOPY;  Service: Endoscopy;  Laterality: N/A;   ESOPHAGOGASTRODUODENOSCOPY (EGD) WITH PROPOFOL N/A 09/27/2019   Procedure: ESOPHAGOGASTRODUODENOSCOPY (EGD) WITH PROPOFOL;  Surgeon: [name], [name], MD;  Location: [facility] ENDOSCOPY;  Service: Gastroenterology;  Laterality: N/A;   EYE SURGERY     bilateral cataracts   HERNIA REPAIR  mid-90s   x2  inguinal   PROSTATE BIOPSY  2013   SURGERY SCROTAL / TESTICULAR     for fibrous growth   Patient Active Problem List   Diagnosis Date Noted   Diabetes mellitus type 2 with complications ([place]) 0000000   Chest pain with moderate risk for cardiac etiology 11/29/2017   Positive cardiac stress test 11/29/2017   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 11/08/2017  Esophagitis, reflux 11/08/2017   SOB (shortness of breath) on exertion 07/02/2017   BPH with obstruction/lower urinary tract symptoms 11/21/2015   History of elevated PSA 11/21/2015   H/O gastric ulcer 09/05/2015   Disorder of eyeball 05/23/2015   H/O infectious disease 05/23/2015   Blood glucose elevated 05/23/2015   BP (high blood pressure) 05/23/2015   HLD (hyperlipidemia) 05/23/2015   Psoriasis 05/23/2015   Absolute anemia 01/02/2014   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   Elevated prostate specific antigen (PSA) 01/02/2014   Elevated blood uric acid level 01/02/2014   Enlarged prostate without lower urinary tract symptoms (luts) 01/02/2014   Bleeding gastrointestinal 04/15/2012    ONSET DATE: ~10  years  REFERRING DIAG:  R26.89 (ICD-10-CM) - Balance problem  R26.9 (ICD-10-CM) - Gait abnormality    THERAPY DIAG:  Difficulty in walking, not elsewhere classified  Muscle weakness (generalized)  Rationale for Evaluation and Treatment: Rehabilitation   SUBJECTIVE:                                                                                                                                                                                             SUBJECTIVE STATEMENT: Pt doing alright. Reports pain in the L shoulder over the last could days that has been radiating into bicep intermittently. Pt also reports hurting the R thumb with nail breaking into cuticle.   Pt accompanied by: self  PERTINENT HISTORY:  Pt is 82 y.o. male who has experienced a decline in his overall functional mobility and stability since Covid and being unable to go back to the gym.  Pt notes that he has noticed it being increasingly difficult performing tasks without losing his balance and just feels generalized weakness.  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    PAIN: Are you having pain? No  PRECAUTIONS: None  WEIGHT BEARING RESTRICTIONS: No  FALLS: Has patient fallen in last 6 months? No  PLOF: Independent; pt does state he has difficulty tying shoes, but he has slip on shoes that assist with that.  PATIENT GOALS: Pt wants to be more mobile and be able to exercise.  OBJECTIVE:  TODAY'S TREATMENT: DATE: 10/12/22  Therex: -Nustep x5 minute,s level 3, seat 7, arms  SPM in 64s (well tolerated in shoulder and hip) cues for neutral hip ER/IR on the LLE with mild improvement of L hip position   Dynamic gait training in parallel bars side stepping R and L 64ft x 3bil.  Lateral partial lunge up to bosu 2 x 12 BLE.  Partial forward lunge  up to bosu 2x  12 BLE Isometric hip adduction 2 x 12; 3 sec hold  Sit<>stand with ball between thighs  2 x 8  no UE support. Mild tactile cues for adequate anterior weight shift.   For all therex, performed with CGA from PT for safety due to mild hip and knee pain in the LLE.   Performed gait training I nrehab gym x 87ft without AD and noted to have decreased antalgic gait pattern following therex.    PATIENT EDUCATION: Education details: Pt educated on role of PT and services provided during current POC, along with prognosis and information about the clinic.  Person educated: Patient Education method: Explanation Education comprehension: verbalized understanding  HOME EXERCISE PROGRAM:  Access Code: [email or web address] URL: [email or web address] Date: 08/17/2022 Prepared by: [place] with Resistance  - 2 x daily - 7 x weekly - 2 sets - 10 reps - Sidelying Reverse Clamshell with Resistance  - 2 x daily - 7 x weekly - 2 sets - 10 reps - Prone Hip Extension with Resistance Loop  - 2 x daily - 7 x weekly - 2 sets - 10 reps - Supine Bridge with Resistance Band  - 2 x daily - 7 x weekly - 2 sets - 10 reps - Side Stepping with Resistance at Thighs  - 2 x daily - 7 x weekly - 2 sets - 10 reps  GOALS:  Goals reviewed with patient? Yes  SHORT TERM GOALS: Target date: 08/31/2022  Pt will be independent with HEP in order to demonstrate increased ability to perform tasks related to occupation/hobbies. Baseline: Pt given HEP 08/13/22 and noted above Goal status: IN PROGRESS   LONG TERM GOALS: Target date: 10/26/2022  1.  Patient (> 52 years old) will complete five times sit to stand test in < 10 seconds indicating an increased LE strength and improved balance. Baseline: 11.81 sec 09/16/22: Not attempted Goal status: INITIAL  2.  Patient will increase FOTO score to equal to or greater than 64 to demonstrate statistically significant improvement in mobility and quality of  life.  Baseline: FOTO - 58 09/16/22: 56 Goal status: IN PROGRESS   3.  Patient will increase Berg Balance score by > 6 points to demonstrate decreased fall risk during functional activities. Baseline: 47 09/16/22: 54 Goal status: MET   4.  Patient will reduce timed up and go to <11 seconds to reduce fall risk and demonstrate improved transfer/gait ability. Baseline: 12.98 sec 09/16/22: 11.96 sec Goal status: IN PROGRESS  5.  Patient will increase 10 meter walk test to >1.78m/s as to improve gait speed for better community ambulation and to reduce fall risk. Baseline: 11.95 sec/0.84 m/s 09/16/22: 9.68 sec/1.033 m/s Goal status: MET  6.  Patient will increase six minute walk test distance to >1000 for progression to community ambulator and improve gait ability Baseline: 875 ft with [facility] 09/16/22: 917 ft with no AD Goal status: IN PROGRESS   7.  Patient will increase FGA score by > 4 points to demonstrate decreased fall risk during functional activities. Baseline: 15/30 Goal status: INITIAL   ASSESSMENT:  CLINICAL IMPRESSION: Pt tolerates session well today. Decreased pain in the L hip and knee allows improved focus on BLE strengthening for this PT sessions. Pt reports mild decrease in L knee pain and reduced antalgic gait pattern at end of session. Reports that he is following  up with urology and oncology April 1st and 3rd respectively. Pt Continues to progress toward goals of care overall, but still warrants skilled PT interventions to reduce pain, improve QoL and allow return to PLOF.         OBJECTIVE IMPAIRMENTS: Abnormal gait, decreased activity tolerance, decreased balance, decreased endurance, decreased mobility, difficulty walking, decreased strength, and decreased safety awareness.   ACTIVITY LIMITATIONS: carrying, lifting, bending, sitting, squatting, and locomotion level  PARTICIPATION LIMITATIONS: community activity and yard work  PERSONAL FACTORS: Age, Fitness,  Past/current experiences, Time since onset of injury/illness/exacerbation, and 3+ comorbidities: DM2, Psoriasis, Prostate Cancer, GI complications, SOB on exertion  are also affecting patient's functional outcome.   REHAB POTENTIAL: Fair pt has significant influx of comorbidities that will likely impact their potential progress with PT.  CLINICAL DECISION MAKING: Stable/uncomplicated  EVALUATION COMPLEXITY: Moderate  PLAN:  PT FREQUENCY: 2x/week  PT DURATION: 12 weeks  PLANNED INTERVENTIONS: Therapeutic exercises, Therapeutic activity, Neuromuscular re-education, Balance training, Gait training, Patient/Family education, Self Care, Joint mobilization, Joint manipulation, Stair training, Vestibular training, Canalith repositioning, Dry Needling, Electrical stimulation, Spinal manipulation, Spinal mobilization, Cryotherapy, Moist heat, and Manual therapy  PLAN FOR NEXT SESSION:   Continue to address strength and balance deficits and manual therapy for pain in the L hip as appropriate.   Barrie Folk PT, DPT  Physical Therapist - [facility]  1:08 PM 10/12/22

## 2022-10-14 ENCOUNTER — Ambulatory Visit: Payer: Medicare Other | Admitting: Physical Therapy

## 2022-10-15 ENCOUNTER — Other Ambulatory Visit: Payer: Self-pay | Admitting: Urology

## 2022-10-15 DIAGNOSIS — N138 Other obstructive and reflux uropathy: Secondary | ICD-10-CM

## 2022-10-19 ENCOUNTER — Ambulatory Visit: Payer: Medicare Other | Admitting: Physical Therapy

## 2022-10-19 DIAGNOSIS — R262 Difficulty in walking, not elsewhere classified: Secondary | ICD-10-CM

## 2022-10-19 DIAGNOSIS — M6281 Muscle weakness (generalized): Secondary | ICD-10-CM

## 2022-10-19 NOTE — Therapy (Signed)
OUTPATIENT PHYSICAL THERAPY NEURO TREATMENT   Patient Name: Brian Callahan MRN: [id number] DOB:[birth year], 82 y.o., male Today's Date: 10/19/2022   PCP: [name], MD REFERRING PROVIDER: [name], MD  END OF SESSION:   PT End of Session - 10/19/22 1151     Visit Number 18    Number of Visits 24    Date for PT Re-Evaluation 10/26/22    Authorization Type [employer] Medicare    Progress Note Due on Visit 20    PT Start Time 1151    PT Stop Time 1230    PT Time Calculation (min) 39 min    Equipment Utilized During Treatment Gait belt    Activity Tolerance Patient tolerated treatment well;No increased pain    Behavior During Therapy [facility] for tasks assessed/performed                   Past Medical History:  Diagnosis Date   Anemia    pt denies 01/18/20   BP (high blood pressure) 05/23/2015   BPH with obstruction/lower urinary tract symptoms    COPD (chronic obstructive pulmonary disease) ([place])    Diabetes mellitus without complication (HCC)    diet controlled   Dyspnea    Encounter for long-term (current) use of other high-risk medications    GERD (gastroesophageal reflux disease)    gastritis   GI bleed    Gout    H/O hernia repair mid-90s   x2   History of elevated PSA    History of Rocky Mountain spotted fever    HLD (hyperlipidemia)    HTN (hypertension)    Hyperglycemia    Hyperuricemia    Incomplete bladder emptying    Macular degeneration    Obesity    Psoriasis    Psoriasis    Urinary frequency    Past Surgical History:  Procedure Laterality Date   APPENDECTOMY     BACK SURGERY     neck and spine 2007   CERVICAL SPINE SURGERY  2007   CHOLECYSTECTOMY N/A 10/18/2017   Procedure: LAPAROSCOPIC CHOLECYSTECTOMY;  Surgeon: [name], MD;  Location: [facility] ORS;  Service: General;  Laterality: N/A;   COLECTOMY  1998   partial, due to obstruction   COLONOSCOPY     lost 10 units of blood   COLONOSCOPY WITH PROPOFOL N/A 09/27/2019    Procedure: COLONOSCOPY WITH PROPOFOL;  Surgeon: [name], [name], MD;  Location: [facility] ENDOSCOPY;  Service: Gastroenterology;  Laterality: N/A;   CYSTOSCOPY WITH INSERTION OF UROLIFT N/A 01/23/2020   Procedure: CYSTOSCOPY WITH INSERTION OF UROLIFT;  Surgeon: [name], MD;  Location: [facility] ORS;  Service: Urology;  Laterality: N/A;   ESOPHAGOGASTRODUODENOSCOPY N/A 10/14/2016   Procedure: ESOPHAGOGASTRODUODENOSCOPY (EGD);  Surgeon: [name], MD;  Location: [facility] ENDOSCOPY;  Service: Endoscopy;  Laterality: N/A;   ESOPHAGOGASTRODUODENOSCOPY (EGD) WITH PROPOFOL N/A 09/27/2019   Procedure: ESOPHAGOGASTRODUODENOSCOPY (EGD) WITH PROPOFOL;  Surgeon: [name], [name], MD;  Location: [facility] ENDOSCOPY;  Service: Gastroenterology;  Laterality: N/A;   EYE SURGERY     bilateral cataracts   HERNIA REPAIR  mid-90s   x2  inguinal   PROSTATE BIOPSY  2013   SURGERY SCROTAL / TESTICULAR     for fibrous growth   Patient Active Problem List   Diagnosis Date Noted   Diabetes mellitus type 2 with complications ([place]) 0000000   Chest pain with moderate risk for cardiac etiology 11/29/2017   Positive cardiac stress test 11/29/2017   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 11/08/2017  Esophagitis, reflux 11/08/2017   SOB (shortness of breath) on exertion 07/02/2017   BPH with obstruction/lower urinary tract symptoms 11/21/2015   History of elevated PSA 11/21/2015   H/O gastric ulcer 09/05/2015   Disorder of eyeball 05/23/2015   H/O infectious disease 05/23/2015   Blood glucose elevated 05/23/2015   BP (high blood pressure) 05/23/2015   HLD (hyperlipidemia) 05/23/2015   Psoriasis 05/23/2015   Absolute anemia 01/02/2014   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   Elevated prostate specific antigen (PSA) 01/02/2014   Elevated blood uric acid level 01/02/2014   Enlarged prostate without lower urinary tract symptoms (luts) 01/02/2014   Bleeding gastrointestinal 04/15/2012    ONSET DATE: ~10  years  REFERRING DIAG:  R26.89 (ICD-10-CM) - Balance problem  R26.9 (ICD-10-CM) - Gait abnormality    THERAPY DIAG:  Difficulty in walking, not elsewhere classified  Muscle weakness (generalized)  Rationale for Evaluation and Treatment: Rehabilitation   SUBJECTIVE:                                                                                                                                                                                             SUBJECTIVE STATEMENT: Pt reports that he is doing well with increased pain in the R shoulder yesterday, but improved slightly today. Reports decreased BLE pain on this day.  Pt reports that he will appointment with urology on 4/1 and then with CA MD on 4/3. States that he will have daily radiation once treatments begin.    Pt accompanied by: Brian Callahan  PERTINENT HISTORY:  Pt is 82 y.o. male who has experienced a decline in his overall functional mobility and stability since Covid and being unable to go back to the gym.  Pt notes that he has noticed it being increasingly difficult performing tasks without losing his balance and just feels generalized weakness.  Pt Brian Callahan-reports having prostate cancer and is undergoing testing to find out which cancer it is (PET scan coming soon).  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    PAIN: Are you having pain? No  PRECAUTIONS: None  WEIGHT BEARING RESTRICTIONS: No  FALLS: Has patient fallen in last 6 months? No  PLOF: Independent; pt does state he has difficulty tying shoes, but he has slip on shoes that assist with that.  PATIENT GOALS: Pt wants to be more mobile and be able to exercise.  OBJECTIVE:  TODAY'S TREATMENT: DATE: 10/19/22  Therex: -Nustep x5 minutes level 4, BLE only with cues for neutral hip position.   Standing In parallel bars.  Rocker board AP weight shift with UE support  2 x 15, without UE support 2 x 15. Static standing for AP contorl x 45  sec. Lateral weight shift with BUE support 2 x 15, without UE support 2 x 15, lateral control for static stande 2 x 30 sec.  Partial squat x10 with intermittent UE support on rails  Step down 5"step with emphasis on eccentric control 2 x8 bil. Side stepping in parallel bars over 3 canes on the floor x 4 bil     Supervision assist for all gait through rehab gym with [facility] or no AD. Mild L antalgic gait pattern.   PATIENT EDUCATION: Education details: Pt educated on role of PT and services provided during current POC, along with prognosis and information about the clinic.  Person educated: Patient Education method: Explanation Education comprehension: verbalized understanding  HOME EXERCISE PROGRAM:  Access Code: [email or web address] URL: [email or web address] Date: 08/17/2022 Prepared by: [place] with Resistance  - 2 x daily - 7 x weekly - 2 sets - 10 reps - Sidelying Reverse Clamshell with Resistance  - 2 x daily - 7 x weekly - 2 sets - 10 reps - Prone Hip Extension with Resistance Loop  - 2 x daily - 7 x weekly - 2 sets - 10 reps - Supine Bridge with Resistance Band  - 2 x daily - 7 x weekly - 2 sets - 10 reps - Side Stepping with Resistance at Thighs  - 2 x daily - 7 x weekly - 2 sets - 10 reps  GOALS:  Goals reviewed with patient? Yes  SHORT TERM GOALS: Target date: 08/31/2022  Pt will be independent with HEP in order to demonstrate increased ability to perform tasks related to occupation/hobbies. Baseline: Pt given HEP 08/13/22 and noted above Goal status: IN PROGRESS   LONG TERM GOALS: Target date: 10/26/2022  1.  Patient (> 8 years old) will complete five times sit to stand test in < 10 seconds indicating an increased LE strength and improved balance. Baseline: 11.81 sec 09/16/22: Not attempted Goal status: INITIAL  2.  Patient will increase FOTO score to equal to or greater than 64 to demonstrate statistically significant improvement in  mobility and quality of life.  Baseline: FOTO - 58 09/16/22: 56 Goal status: IN PROGRESS   3.  Patient will increase Berg Balance score by > 6 points to demonstrate decreased fall risk during functional activities. Baseline: 47 09/16/22: 54 Goal status: MET   4.  Patient will reduce timed up and go to <11 seconds to reduce fall risk and demonstrate improved transfer/gait ability. Baseline: 12.98 sec 09/16/22: 11.96 sec Goal status: IN PROGRESS  5.  Patient will increase 10 meter walk test to >1.91m/s as to improve gait speed for better community ambulation and to reduce fall risk. Baseline: 11.95 sec/0.84 m/s 09/16/22: 9.68 sec/1.033 m/s Goal status: MET  6.  Patient will increase six minute walk test distance to >1000 for progression to community ambulator and improve gait ability Baseline: 875 ft with [facility] 09/16/22: 917 ft with no AD Goal status: IN PROGRESS   7.  Patient will increase FGA score by > 4 points to demonstrate decreased fall risk during functional activities. Baseline: 15/30 Goal status: INITIAL   ASSESSMENT:  CLINICAL IMPRESSION: Pt tolerates session well today. Able to focus PT treatment on balance and strengthening due to continued reduction of pain in the LLE.  Continues to progress toward goals of care overall, but still warrants skilled PT interventions to reduce pain, improve QoL  and allow return to PLOF.         OBJECTIVE IMPAIRMENTS: Abnormal gait, decreased activity tolerance, decreased balance, decreased endurance, decreased mobility, difficulty walking, decreased strength, and decreased safety awareness.   ACTIVITY LIMITATIONS: carrying, lifting, bending, sitting, squatting, and locomotion level  PARTICIPATION LIMITATIONS: community activity and yard work  PERSONAL FACTORS: Age, Fitness, Past/current experiences, Time since onset of injury/illness/exacerbation, and 3+ comorbidities: DM2, Psoriasis, Prostate Cancer, GI complications, SOB on exertion   are also affecting patient's functional outcome.   REHAB POTENTIAL: Fair pt has significant influx of comorbidities that will likely impact their potential progress with PT.  CLINICAL DECISION MAKING: Stable/uncomplicated  EVALUATION COMPLEXITY: Moderate  PLAN:  PT FREQUENCY: 2x/week  PT DURATION: 12 weeks  PLANNED INTERVENTIONS: Therapeutic exercises, Therapeutic activity, Neuromuscular re-education, Balance training, Gait training, Patient/Family education, Brian Callahan Care, Joint mobilization, Joint manipulation, Stair training, Vestibular training, Canalith repositioning, Dry Needling, Electrical stimulation, Spinal manipulation, Spinal mobilization, Cryotherapy, Moist heat, and Manual therapy  PLAN FOR NEXT SESSION:   Continue to address strength and balance deficits and manual therapy for pain in the L hip as appropriate.   Barrie Folk PT, DPT  Physical Therapist - [facility]  12:35 PM 10/19/22

## 2022-10-22 ENCOUNTER — Ambulatory Visit: Payer: Medicare Other | Admitting: Physical Therapy

## 2022-10-22 DIAGNOSIS — M6281 Muscle weakness (generalized): Secondary | ICD-10-CM

## 2022-10-22 DIAGNOSIS — R262 Difficulty in walking, not elsewhere classified: Secondary | ICD-10-CM

## 2022-10-22 NOTE — Therapy (Signed)
OUTPATIENT PHYSICAL THERAPY NEURO TREATMENT   Patient Name: Brian Callahan MRN: [id number] DOB:Jul 03, 1941, 82 y.o., male Today's Date: 10/22/2022   PCP: [name], MD REFERRING PROVIDER: [name], MD  END OF SESSION:   PT End of Session - 10/22/22 1101     Visit Number 19    Number of Visits 24    Date for PT Re-Evaluation 10/26/22    Authorization Type UHC Medicare    Progress Note Due on Visit 20    PT Start Time 1105    PT Stop Time 1145    PT Time Calculation (min) 40 min    Equipment Utilized During Treatment Gait belt    Activity Tolerance Patient tolerated treatment well;No increased pain    Behavior During Therapy [facility] for tasks assessed/performed                   Past Medical History:  Diagnosis Date   Anemia    pt denies 01/18/20   BP (high blood pressure) 05/23/2015   BPH with obstruction/lower urinary tract symptoms    COPD (chronic obstructive pulmonary disease) ([place])    Diabetes mellitus without complication (HCC)    diet controlled   Dyspnea    Encounter for long-term (current) use of other high-risk medications    GERD (gastroesophageal reflux disease)    gastritis   GI bleed    Gout    H/O hernia repair mid-90s   x2   History of elevated PSA    History of Rocky Mountain spotted fever    HLD (hyperlipidemia)    HTN (hypertension)    Hyperglycemia    Hyperuricemia    Incomplete bladder emptying    Macular degeneration    Obesity    Psoriasis    Psoriasis    Urinary frequency    Past Surgical History:  Procedure Laterality Date   APPENDECTOMY     BACK SURGERY     neck and spine 2007   CERVICAL SPINE SURGERY  2007   CHOLECYSTECTOMY N/A 10/18/2017   Procedure: LAPAROSCOPIC CHOLECYSTECTOMY;  Surgeon: [name], MD;  Location: [facility] ORS;  Service: General;  Laterality: N/A;   COLECTOMY  1998   partial, due to obstruction   COLONOSCOPY     lost 10 units of blood   COLONOSCOPY WITH PROPOFOL N/A 09/27/2019    Procedure: COLONOSCOPY WITH PROPOFOL;  Surgeon: [name], [name], MD;  Location: [facility] ENDOSCOPY;  Service: Gastroenterology;  Laterality: N/A;   CYSTOSCOPY WITH INSERTION OF UROLIFT N/A 01/23/2020   Procedure: CYSTOSCOPY WITH INSERTION OF UROLIFT;  Surgeon: [name], MD;  Location: [facility] ORS;  Service: Urology;  Laterality: N/A;   ESOPHAGOGASTRODUODENOSCOPY N/A 10/14/2016   Procedure: ESOPHAGOGASTRODUODENOSCOPY (EGD);  Surgeon: [name], MD;  Location: [facility] ENDOSCOPY;  Service: Endoscopy;  Laterality: N/A;   ESOPHAGOGASTRODUODENOSCOPY (EGD) WITH PROPOFOL N/A 09/27/2019   Procedure: ESOPHAGOGASTRODUODENOSCOPY (EGD) WITH PROPOFOL;  Surgeon: [name], [name], MD;  Location: [facility] ENDOSCOPY;  Service: Gastroenterology;  Laterality: N/A;   EYE SURGERY     bilateral cataracts   HERNIA REPAIR  mid-90s   x2  inguinal   PROSTATE BIOPSY  2013   SURGERY SCROTAL / TESTICULAR     for fibrous growth   Patient Active Problem List   Diagnosis Date Noted   Diabetes mellitus type 2 with complications ([place]) 0000000   Chest pain with moderate risk for cardiac etiology 11/29/2017   Positive cardiac stress test 11/29/2017   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 11/08/2017  Esophagitis, reflux 11/08/2017   SOB (shortness of breath) on exertion 07/02/2017   BPH with obstruction/lower urinary tract symptoms 11/21/2015   History of elevated PSA 11/21/2015   H/O gastric ulcer 09/05/2015   Disorder of eyeball 05/23/2015   H/O infectious disease 05/23/2015   Blood glucose elevated 05/23/2015   BP (high blood pressure) 05/23/2015   HLD (hyperlipidemia) 05/23/2015   Psoriasis 05/23/2015   Absolute anemia 01/02/2014   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   Elevated prostate specific antigen (PSA) 01/02/2014   Elevated blood uric acid level 01/02/2014   Enlarged prostate without lower urinary tract symptoms (luts) 01/02/2014   Bleeding gastrointestinal 04/15/2012    ONSET DATE: ~10  years  REFERRING DIAG:  R26.89 (ICD-10-CM) - Balance problem  R26.9 (ICD-10-CM) - Gait abnormality    THERAPY DIAG:  Difficulty in walking, not elsewhere classified  Muscle weakness (generalized)  Rationale for Evaluation and Treatment: Rehabilitation   SUBJECTIVE:                                                                                                                                                                                             SUBJECTIVE STATEMENT: Pt reports that he is doing "alright." No real complaints on this day. Reports that is excited and nervous about MD appointments next week and to find out the treatment plan for prostate CA. Pt reports that he may miss one or both treatments next week depending on how MD appointments go.     Pt accompanied by: self  PERTINENT HISTORY:  Pt is 82 y.o. male who has experienced a decline in his overall functional mobility and stability since Covid and being unable to go back to the gym.  Pt notes that he has noticed it being increasingly difficult performing tasks without losing his balance and just feels generalized weakness.  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    PAIN: Are you having pain? No  PRECAUTIONS: None  WEIGHT BEARING RESTRICTIONS: No  FALLS: Has patient fallen in last 6 months? No  PLOF: Independent; pt does state he has difficulty tying shoes, but he has slip on shoes that assist with that.  PATIENT GOALS: Pt wants to be more mobile and be able to exercise.  OBJECTIVE:  TODAY'S TREATMENT: DATE: 10/22/22  Therex: -Nustep x5 minutes level 4, BLE only with cues for neutral hip position.   Pt performed standing therex performed at rail on wall :  Partial squat 2  x 12 Standing hip flexor/calf stretch 3 x 15 sec  bil Side lunge 2 x 5 bil   Posterior step down 2 x8 bil Cues for decreased speed of eccentric movement throughout.    69mWT: 869ft with SPC TUG: 12.01 sec, 10.8sec, performed with SPC  improved from 11.96 on 2/21  Pt left at end of session in bathroom.   PATIENT EDUCATION: Education details: Pt educated on role of PT and services provided during current POC, along with prognosis and information about the clinic. Pt educated throughout session about proper posture and technique with exercises. Improved exercise technique, movement at target joints, use of target muscles after min to mod verbal, visual, tactile cues.   Person educated: Patient Education method: Explanation Education comprehension: verbalized understanding  HOME EXERCISE PROGRAM:  Access Code: [email or web address] URL: [email or web address] Date: 08/17/2022 Prepared by: [place] with Resistance  - 2 x daily - 7 x weekly - 2 sets - 10 reps - Sidelying Reverse Clamshell with Resistance  - 2 x daily - 7 x weekly - 2 sets - 10 reps - Prone Hip Extension with Resistance Loop  - 2 x daily - 7 x weekly - 2 sets - 10 reps - Supine Bridge with Resistance Band  - 2 x daily - 7 x weekly - 2 sets - 10 reps - Side Stepping with Resistance at Thighs  - 2 x daily - 7 x weekly - 2 sets - 10 reps  GOALS:  Goals reviewed with patient? Yes  SHORT TERM GOALS: Target date: 08/31/2022  Pt will be independent with HEP in order to demonstrate increased ability to perform tasks related to occupation/hobbies. Baseline: Pt given HEP 08/13/22 and noted above Goal status: IN PROGRESS   LONG TERM GOALS: Target date: 10/26/2022  1.  Patient (> 39 years old) will complete five times sit to stand test in < 10 seconds indicating an increased LE strength and improved balance. Baseline: 11.81 sec 09/16/22: Not attempted Goal status: INITIAL  2.  Patient will increase FOTO score to equal to or greater than 64 to demonstrate statistically  significant improvement in mobility and quality of life.  Baseline: FOTO - 58 09/16/22: 56 Goal status: IN PROGRESS   3.  Patient will increase Berg Balance score by > 6 points to demonstrate decreased fall risk during functional activities. Baseline: 47 09/16/22: 54 Goal status: MET   4.  Patient will reduce timed up and go to <11 seconds to reduce fall risk and demonstrate improved transfer/gait ability. Baseline: 12.98 sec 09/16/22: 11.96 sec Goal status: IN PROGRESS  5.  Patient will increase 10 meter walk test to >1.14m/s as to improve gait speed for better community ambulation and to reduce fall risk. Baseline: 11.95 sec/0.84 m/s 09/16/22: 9.68 sec/1.033 m/s Goal status: MET  6.  Patient will increase six minute walk test distance to >1000 for progression to community ambulator and improve gait ability Baseline: 875 ft with [facility] 09/16/22: 917 ft with no AD Goal status: IN PROGRESS   7.  Patient will increase FGA score by > 4 points to demonstrate decreased fall risk during functional activities. Baseline: 15/30 Goal status: INITIAL   ASSESSMENT:  CLINICAL IMPRESSION: Pt tolerates session well today. Pt continues to have reduced pain in the LLE allowing improved focus on dynamic strengthening. Pt reports MD visit next week to establish CA treatment plan. Pt will reach out to therapy office if conflicts arise with current treatment plan. Continues to progress toward  goals of care overall, but still warrants skilled PT interventions to reduce pain, improve QoL and allow return to PLOF.         OBJECTIVE IMPAIRMENTS: Abnormal gait, decreased activity tolerance, decreased balance, decreased endurance, decreased mobility, difficulty walking, decreased strength, and decreased safety awareness.   ACTIVITY LIMITATIONS: carrying, lifting, bending, sitting, squatting, and locomotion level  PARTICIPATION LIMITATIONS: community activity and yard work  PERSONAL FACTORS: Age, Fitness,  Past/current experiences, Time since onset of injury/illness/exacerbation, and 3+ comorbidities: DM2, Psoriasis, Prostate Cancer, GI complications, SOB on exertion  are also affecting patient's functional outcome.   REHAB POTENTIAL: Fair pt has significant influx of comorbidities that will likely impact their potential progress with PT.  CLINICAL DECISION MAKING: Stable/uncomplicated  EVALUATION COMPLEXITY: Moderate  PLAN:  PT FREQUENCY: 2x/week  PT DURATION: 12 weeks  PLANNED INTERVENTIONS: Therapeutic exercises, Therapeutic activity, Neuromuscular re-education, Balance training, Gait training, Patient/Family education, Self Care, Joint mobilization, Joint manipulation, Stair training, Vestibular training, Canalith repositioning, Dry Needling, Electrical stimulation, Spinal manipulation, Spinal mobilization, Cryotherapy, Moist heat, and Manual therapy  PLAN FOR NEXT SESSION:   Continue to address strength and balance deficits and manual therapy for pain in the L hip as appropriate.   Barrie Folk PT, DPT  Physical Therapist - [facility]  11:47 AM 10/22/22

## 2022-10-26 ENCOUNTER — Ambulatory Visit (INDEPENDENT_AMBULATORY_CARE_PROVIDER_SITE_OTHER): Payer: Medicare Other | Admitting: Urology

## 2022-10-26 ENCOUNTER — Encounter: Payer: Self-pay | Admitting: Urology

## 2022-10-26 VITALS — BP 150/81 | HR 84 | Ht 67.0 in | Wt 190.0 lb

## 2022-10-26 DIAGNOSIS — C61 Malignant neoplasm of prostate: Secondary | ICD-10-CM | POA: Diagnosis not present

## 2022-10-26 MED ORDER — GENTAMICIN SULFATE 40 MG/ML IJ SOLN
80.0000 mg | Freq: Once | INTRAMUSCULAR | Status: AC
  Administered 2022-10-26: 80 mg via INTRAMUSCULAR

## 2022-10-26 MED ORDER — LEVOFLOXACIN 500 MG PO TABS
500.0000 mg | ORAL_TABLET | Freq: Once | ORAL | Status: AC
  Administered 2022-10-26: 500 mg via ORAL

## 2022-10-26 MED ORDER — LEUPROLIDE ACETATE (6 MONTH) 45 MG ~~LOC~~ KIT
45.0000 mg | PACK | Freq: Once | SUBCUTANEOUS | Status: AC
  Administered 2022-10-26: 45 mg via SUBCUTANEOUS

## 2022-10-26 NOTE — Progress Notes (Signed)
10/26/22  CC: gold fiducial marker placement  HPI: 82 y.o. male with prostate cancer who presents today for placement of fiducial seed markers in anticipation of his upcoming IMRT with Dr. Baruch Gouty.  Prostate Gold fiducial Marker Placement Procedure   Informed consent was obtained after discussing risks/benefits of the procedure.  A time out was performed to ensure correct patient identity.  Pre-Procedure: - Gentamicin given prophylactically - PO Levaquin 500 mg also given today  Procedure: - Lidocaine jelly was administered per rectum - Rectal ultrasound probe was placed without difficulty and the prostate visualized - Prostatic block performed with 10 mL 1% Xylocaine - 3 fiducial gold seed markers placed, one at right base, one at left base, one at apex of prostate gland under transrectal ultrasound guidance  Post-Procedure: - Patient tolerated the procedure well - He was counseled to seek immediate medical attention if experiences any severe pain, significant bleeding, or fevers    John Giovanni, MD

## 2022-10-26 NOTE — Progress Notes (Signed)
Eligard SubQ Injection   Due to Prostate Cancer patient is present today for a Eligard Injection.  Medication: Eligard 6 month Dose: 45 mg  Location: left Lot: [id number] Exp: 03/20/2024  Patient tolerated well, no complications were noted  Performed by: Gaspar Cola CMA  Patient  on Vitamin D 800-1000iu and Calcium 1000-1200mg  daily while on Androgen Deprivation Therapy.  PA approval dates:

## 2022-10-26 NOTE — Patient Instructions (Addendum)
Patient  on Vitamin D 800-1000iu and Calcium 1000-1200mg 

## 2022-10-27 ENCOUNTER — Ambulatory Visit: Payer: Medicare Other | Attending: Neurology | Admitting: Physical Therapy

## 2022-10-27 DIAGNOSIS — R262 Difficulty in walking, not elsewhere classified: Secondary | ICD-10-CM | POA: Diagnosis present

## 2022-10-27 DIAGNOSIS — M6281 Muscle weakness (generalized): Secondary | ICD-10-CM

## 2022-10-27 NOTE — Therapy (Addendum)
OUTPATIENT PHYSICAL THERAPY NEURO TREATMENT PHYSICAL THERAPY PROGRESS NOTE/Re-cert   Dates of reporting period  09/16/22   to   10/27/22    Patient Name: Brian Callahan MRN: [id number] DOB:08-Jun-1941, 82 y.o., male Today's Date: 10/27/2022   PCP: Farrel Gobble, MD REFERRING PROVIDER: Theora Master, MD  END OF SESSION:   PT End of Session - 10/27/22 1432     Visit Number 20    Number of Visits 44   Date for PT Re-Evaluation 01/19/2023   Authorization Type [employer] Medicare    Progress Note Due on Visit 20    PT Start Time 1348    PT Stop Time 1430    PT Time Calculation (min) 42 min    Equipment Utilized During Treatment Gait belt    Activity Tolerance Patient tolerated treatment well;No increased pain    Behavior During Therapy [facility] for tasks assessed/performed                   Past Medical History:  Diagnosis Date   Anemia    pt denies 01/18/20   BP (high blood pressure) 05/23/2015   BPH with obstruction/lower urinary tract symptoms    COPD (chronic obstructive pulmonary disease)    Diabetes mellitus without complication    diet controlled   Dyspnea    Encounter for long-term (current) use of other high-risk medications    GERD (gastroesophageal reflux disease)    gastritis   GI bleed    Gout    H/O hernia repair mid-90s   x2   History of elevated PSA    History of Rocky Mountain spotted fever    HLD (hyperlipidemia)    HTN (hypertension)    Hyperglycemia    Hyperuricemia    Incomplete bladder emptying    Macular degeneration    Obesity    Psoriasis    Psoriasis    Urinary frequency    Past Surgical History:  Procedure Laterality Date   APPENDECTOMY     BACK SURGERY     neck and spine 2007   CERVICAL SPINE SURGERY  2007   CHOLECYSTECTOMY N/A 10/18/2017   Procedure: LAPAROSCOPIC CHOLECYSTECTOMY;  Surgeon: Carolan Shiver, MD;  Location: [facility] ORS;  Service: General;  Laterality: N/A;   COLECTOMY  1998   partial, due to obstruction    COLONOSCOPY     lost 10 units of blood   COLONOSCOPY WITH PROPOFOL N/A 09/27/2019   Procedure: COLONOSCOPY WITH PROPOFOL;  Surgeon: [name], Boykin Nearing, MD;  Location: [facility] ENDOSCOPY;  Service: Gastroenterology;  Laterality: N/A;   CYSTOSCOPY WITH INSERTION OF UROLIFT N/A 01/23/2020   Procedure: CYSTOSCOPY WITH INSERTION OF UROLIFT;  Surgeon: Riki Altes, MD;  Location: [facility] ORS;  Service: Urology;  Laterality: N/A;   ESOPHAGOGASTRODUODENOSCOPY N/A 10/14/2016   Procedure: ESOPHAGOGASTRODUODENOSCOPY (EGD);  Surgeon: Scot Jun, MD;  Location: [facility] ENDOSCOPY;  Service: Endoscopy;  Laterality: N/A;   ESOPHAGOGASTRODUODENOSCOPY (EGD) WITH PROPOFOL N/A 09/27/2019   Procedure: ESOPHAGOGASTRODUODENOSCOPY (EGD) WITH PROPOFOL;  Surgeon: [name], Boykin Nearing, MD;  Location: [facility] ENDOSCOPY;  Service: Gastroenterology;  Laterality: N/A;   EYE SURGERY     bilateral cataracts   HERNIA REPAIR  mid-90s   x2  inguinal   PROSTATE BIOPSY  2013   SURGERY SCROTAL / TESTICULAR     for fibrous growth   Patient Active Problem List   Diagnosis Date Noted   Diabetes mellitus type 2 with complications 03/23/2018   Chest pain with moderate risk for cardiac etiology 11/29/2017  Positive cardiac stress test 11/29/2017   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 11/08/2017   Esophagitis, reflux 11/08/2017   SOB (shortness of breath) on exertion 07/02/2017   BPH with obstruction/lower urinary tract symptoms 11/21/2015   History of elevated PSA 11/21/2015   H/O gastric ulcer 09/05/2015   Disorder of eyeball 05/23/2015   H/O infectious disease 05/23/2015   Blood glucose elevated 05/23/2015   BP (high blood pressure) 05/23/2015   HLD (hyperlipidemia) 05/23/2015   Psoriasis 05/23/2015   Absolute anemia 01/02/2014   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   Elevated prostate specific antigen (PSA) 01/02/2014   Elevated blood uric acid level 01/02/2014   Enlarged prostate without lower urinary tract symptoms (luts)  01/02/2014   Bleeding gastrointestinal 04/15/2012    ONSET DATE: ~10 years  REFERRING DIAG:  R26.89 (ICD-10-CM) - Balance problem  R26.9 (ICD-10-CM) - Gait abnormality    THERAPY DIAG:  Difficulty in walking, not elsewhere classified  Muscle weakness (generalized)  Rationale for Evaluation and Treatment: Rehabilitation   SUBJECTIVE:                                                                                                                                                                                             SUBJECTIVE STATEMENT: Pt reports that he is in severe pain 8/10 from having to lie on L side for extended period of time during procedure on 4/1.    Pt accompanied by: self  PERTINENT HISTORY:  Pt is 82 y.o. male who has experienced a decline in his overall functional mobility and stability since Covid and being unable to go back to the gym.  Pt notes that he has noticed it being increasingly difficult performing tasks without losing his balance and just feels generalized weakness.  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    PAIN: Are you having pain? No  PRECAUTIONS: None  WEIGHT BEARING RESTRICTIONS: No  FALLS: Has patient fallen in last 6 months? No  PLOF: Independent; pt does state he has difficulty tying shoes, but he has slip on shoes that assist with that.  PATIENT GOALS: Pt wants to be more mobile and be able to exercise.  OBJECTIVE:  Noted to have severely antalgic gait on this day on the L hip.   TODAY'S TREATMENT: DATE: 10/27/22    PT performed manual therapy for pain modulation in to LLE Longitudinal distraction 2 x 45 sec.  Band assisted Grade 2-3 L hip lateral mobs 2 x  45 sec  Sustained gluteal stretch 1 x 45 sec and piriformis stretch 2 x 1 min   Therex: -Nustep x5 minutes level 4,  BUE/BLE only with cues for neutral hip position. Supine therex:  SAQ 2 x 15 LTR x 20 Bridge with 3 sec hold x 10  Heel slide 2 x 10 BLE  Gait with SPC x 26700ft with decreased pain and reduce antalgic gait.  Standing hip flexor stretch 3 x 15sec bil.  Hip extension x 12 LLE only  Hip abduction x 12 LLE only.   Pt reports reduced pain and stiffness upon completion of PT treatment.   PATIENT EDUCATION: Education details: Pt educated on role of PT and services provided during current POC, along with prognosis and information about the clinic. Pt educated throughout session about proper posture and technique with exercises. Improved exercise technique, movement at target joints, use of target muscles after min to mod verbal, visual, tactile cues.   Person educated: Patient Education method: Explanation Education comprehension: verbalized understanding  HOME EXERCISE PROGRAM:  Access Code: [email or web address] URL: [email or web address] Date: 08/17/2022 Prepared by: Tomasa HoseJosh Robbins  Exercises - Clamshell with Resistance  - 2 x daily - 7 x weekly - 2 sets - 10 reps - Sidelying Reverse Clamshell with Resistance  - 2 x daily - 7 x weekly - 2 sets - 10 reps - Prone Hip Extension with Resistance Loop  - 2 x daily - 7 x weekly - 2 sets - 10 reps - Supine Bridge with Resistance Band  - 2 x daily - 7 x weekly - 2 sets - 10 reps - Side Stepping with Resistance at Thighs  - 2 x daily - 7 x weekly - 2 sets - 10 reps  GOALS:  Goals reviewed with patient? Yes  SHORT TERM GOALS: Target date: 08/31/2022  Pt will be independent with HEP in order to demonstrate increased ability to perform tasks related to occupation/hobbies. Baseline: Pt given HEP 08/13/22 and noted above Goal status: MET   LONG TERM GOALS: Target date: 01/19/2023  1.  Patient (> 80 years old) will complete five times sit to stand test in < 10 seconds indicating an increased LE strength and improved balance. Baseline: 11.81  sec 09/16/22: Not attempted Goal status: INITIAL  2.  Patient will increase FOTO score to equal to or greater than 64 to demonstrate statistically significant improvement in mobility and quality of life.  Baseline: FOTO - 58 09/16/22: 56 Goal status: IN PROGRESS   3.  Patient will increase Berg Balance score by > 6 points to demonstrate decreased fall risk during functional activities. Baseline: 47 09/16/22: 54 Goal status: MET   4.  Patient will reduce timed up and go to <11 seconds to reduce fall risk and demonstrate improved transfer/gait ability. Baseline: 12.98 sec 09/16/22: 11.96 sec 3/28 11.45 Goal status: IN PROGRESS  5.  Patient will increase 10 meter walk test to >1.1266m/s as to improve gait speed for better community ambulation and to reduce fall risk. Baseline: 11.95 sec/0.84 m/s 09/16/22: 9.68 sec/1.033 m/s Goal status: MET  6.  Patient will increase six minute walk test distance to >1000 for progression to community ambulator and improve gait ability Baseline: 875 ft with [facility] 09/16/22: 917 ft with no AD 3/28: 82605ft  Goal status: IN PROGRESS   7.  Patient will increase FGA score by > 4 points to demonstrate decreased fall risk during functional activities. Baseline: 15/30 Goal status: INITIAL   ASSESSMENT:  CLINICAL IMPRESSION: Pt  tolerates session well today. Pt with increased pain after pt required to be on L side for procedure. PT treatment focused on pain modulation and L hip strength/ROM. Notes decreased pain following treatment. Patient's condition has the potential to improve in response to therapy. Maximum improvement is yet to be obtained. The anticipated improvement is attainable and reasonable in a generally predictable time. Continues to progress toward goals of care overall, but still warrants skilled PT interventions to reduce pain, improve QoL and allow return to PLOF.         OBJECTIVE IMPAIRMENTS: Abnormal gait, decreased activity tolerance, decreased  balance, decreased endurance, decreased mobility, difficulty walking, decreased strength, and decreased safety awareness.   ACTIVITY LIMITATIONS: carrying, lifting, bending, sitting, squatting, and locomotion level  PARTICIPATION LIMITATIONS: community activity and yard work  PERSONAL FACTORS: Age, Fitness, Past/current experiences, Time since onset of injury/illness/exacerbation, and 3+ comorbidities: DM2, Psoriasis, Prostate Cancer, GI complications, SOB on exertion  are also affecting patient's functional outcome.   REHAB POTENTIAL: Fair pt has significant influx of comorbidities that will likely impact their potential progress with PT.  CLINICAL DECISION MAKING: Stable/uncomplicated  EVALUATION COMPLEXITY: Moderate  PLAN:  PT FREQUENCY: 2x/week  PT DURATION: 12 weeks  PLANNED INTERVENTIONS: Therapeutic exercises, Therapeutic activity, Neuromuscular re-education, Balance training, Gait training, Patient/Family education, Self Care, Joint mobilization, Joint manipulation, Stair training, Vestibular training, Canalith repositioning, Dry Needling, Electrical stimulation, Spinal manipulation, Spinal mobilization, Cryotherapy, Moist heat, and Manual therapy  PLAN FOR NEXT SESSION:   Continue to address strength and balance deficits and manual therapy for pain in the L hip as appropriate.   Grier Rocher PT, DPT  Physical Therapist - [place]  [facility]  2:37 PM 10/27/22

## 2022-10-28 ENCOUNTER — Ambulatory Visit: Payer: Medicare Other

## 2022-10-29 ENCOUNTER — Ambulatory Visit: Payer: Medicare Other

## 2022-10-29 ENCOUNTER — Ambulatory Visit: Payer: Medicare Other | Admitting: Physical Therapy

## 2022-11-02 ENCOUNTER — Ambulatory Visit
Admission: RE | Admit: 2022-11-02 | Discharge: 2022-11-02 | Disposition: A | Discharge status: Home / Self Care | Payer: Medicare Other | Source: Ambulatory Visit | Attending: Radiation Oncology | Admitting: Radiation Oncology

## 2022-11-02 DIAGNOSIS — R351 Nocturia: Secondary | ICD-10-CM | POA: Diagnosis not present

## 2022-11-02 DIAGNOSIS — Z87891 Personal history of nicotine dependence: Secondary | ICD-10-CM | POA: Diagnosis not present

## 2022-11-02 DIAGNOSIS — Z79899 Other long term (current) drug therapy: Secondary | ICD-10-CM | POA: Diagnosis not present

## 2022-11-02 DIAGNOSIS — I10 Essential (primary) hypertension: Secondary | ICD-10-CM | POA: Insufficient documentation

## 2022-11-02 DIAGNOSIS — J449 Chronic obstructive pulmonary disease, unspecified: Secondary | ICD-10-CM | POA: Diagnosis not present

## 2022-11-02 DIAGNOSIS — K219 Gastro-esophageal reflux disease without esophagitis: Secondary | ICD-10-CM | POA: Diagnosis not present

## 2022-11-02 DIAGNOSIS — Z7984 Long term (current) use of oral hypoglycemic drugs: Secondary | ICD-10-CM | POA: Insufficient documentation

## 2022-11-02 DIAGNOSIS — E785 Hyperlipidemia, unspecified: Secondary | ICD-10-CM | POA: Diagnosis not present

## 2022-11-02 DIAGNOSIS — E669 Obesity, unspecified: Secondary | ICD-10-CM | POA: Insufficient documentation

## 2022-11-02 DIAGNOSIS — C61 Malignant neoplasm of prostate: Secondary | ICD-10-CM | POA: Diagnosis not present

## 2022-11-03 ENCOUNTER — Ambulatory Visit: Payer: Medicare Other | Admitting: Physical Therapy

## 2022-11-03 DIAGNOSIS — M6281 Muscle weakness (generalized): Secondary | ICD-10-CM

## 2022-11-03 DIAGNOSIS — R262 Difficulty in walking, not elsewhere classified: Secondary | ICD-10-CM

## 2022-11-03 NOTE — Therapy (Signed)
OUTPATIENT PHYSICAL THERAPY NEURO TREATMENT /RE-CERT    Patient Name: Brian Callahan MRN: [id number] DOB:1941/06/13, 82 y.o., male Today's Date: 10/27/2022   PCP: Farrel Gobble, MD REFERRING PROVIDER: Theora Master, MD  END OF SESSION:   PT End of Session - 11/03/22 1416     Visit Number 21    Number of Visits 45    Date for PT Re-Evaluation 01/26/23    Authorization Type [employer] Medicare    Progress Note Due on Visit 30    PT Start Time 1346    Equipment Utilized During Treatment Gait belt    Activity Tolerance Patient tolerated treatment well;No increased pain    Behavior During Therapy [facility] for tasks assessed/performed                   Past Medical History:  Diagnosis Date   Anemia    pt denies 01/18/20   BP (high blood pressure) 05/23/2015   BPH with obstruction/lower urinary tract symptoms    COPD (chronic obstructive pulmonary disease)    Diabetes mellitus without complication    diet controlled   Dyspnea    Encounter for long-term (current) use of other high-risk medications    GERD (gastroesophageal reflux disease)    gastritis   GI bleed    Gout    H/O hernia repair mid-90s   x2   History of elevated PSA    History of Rocky Mountain spotted fever    HLD (hyperlipidemia)    HTN (hypertension)    Hyperglycemia    Hyperuricemia    Incomplete bladder emptying    Macular degeneration    Obesity    Psoriasis    Psoriasis    Urinary frequency    Past Surgical History:  Procedure Laterality Date   APPENDECTOMY     BACK SURGERY     neck and spine 2007   CERVICAL SPINE SURGERY  2007   CHOLECYSTECTOMY N/A 10/18/2017   Procedure: LAPAROSCOPIC CHOLECYSTECTOMY;  Surgeon: Carolan Shiver, MD;  Location: [facility] ORS;  Service: General;  Laterality: N/A;   COLECTOMY  1998   partial, due to obstruction   COLONOSCOPY     lost 10 units of blood   COLONOSCOPY WITH PROPOFOL N/A 09/27/2019   Procedure: COLONOSCOPY WITH PROPOFOL;  Surgeon: [name], Boykin Nearing, MD;  Location: [facility] ENDOSCOPY;  Service: Gastroenterology;  Laterality: N/A;   CYSTOSCOPY WITH INSERTION OF UROLIFT N/A 01/23/2020   Procedure: CYSTOSCOPY WITH INSERTION OF UROLIFT;  Surgeon: Riki Altes, MD;  Location: [facility] ORS;  Service: Urology;  Laterality: N/A;   ESOPHAGOGASTRODUODENOSCOPY N/A 10/14/2016   Procedure: ESOPHAGOGASTRODUODENOSCOPY (EGD);  Surgeon: Scot Jun, MD;  Location: [facility] ENDOSCOPY;  Service: Endoscopy;  Laterality: N/A;   ESOPHAGOGASTRODUODENOSCOPY (EGD) WITH PROPOFOL N/A 09/27/2019   Procedure: ESOPHAGOGASTRODUODENOSCOPY (EGD) WITH PROPOFOL;  Surgeon: [name], Boykin Nearing, MD;  Location: [facility] ENDOSCOPY;  Service: Gastroenterology;  Laterality: N/A;   EYE SURGERY     bilateral cataracts   HERNIA REPAIR  mid-90s   x2  inguinal   PROSTATE BIOPSY  2013   SURGERY SCROTAL / TESTICULAR     for fibrous growth   Patient Active Problem List   Diagnosis Date Noted   Diabetes mellitus type 2 with complications 03/23/2018   Chest pain with moderate risk for cardiac etiology 11/29/2017   Positive cardiac stress test 11/29/2017   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 11/08/2017   Esophagitis, reflux 11/08/2017   SOB (shortness of breath) on exertion 07/02/2017   BPH with  obstruction/lower urinary tract symptoms 11/21/2015   History of elevated PSA 11/21/2015   H/O gastric ulcer 09/05/2015   Disorder of eyeball 05/23/2015   H/O infectious disease 05/23/2015   Blood glucose elevated 05/23/2015   BP (high blood pressure) 05/23/2015   HLD (hyperlipidemia) 05/23/2015   Psoriasis 05/23/2015   Absolute anemia 01/02/2014   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   Elevated prostate specific antigen (PSA) 01/02/2014   Elevated blood uric acid level 01/02/2014   Enlarged prostate without lower urinary tract symptoms (luts) 01/02/2014   Bleeding gastrointestinal 04/15/2012    ONSET DATE: ~10 years  REFERRING DIAG:  R26.89 (ICD-10-CM) - Balance problem  R26.9 (ICD-10-CM)  - Gait abnormality    THERAPY DIAG:  Difficulty in walking, not elsewhere classified  Muscle weakness (generalized)  Rationale for Evaluation and Treatment: Rehabilitation   SUBJECTIVE:                                                                                                                                                                                             SUBJECTIVE STATEMENT: Pt reports no pain on this day except when he presses on tender spot L gluteal region from injection. Looking forward to starting CA treatment next week. Wanting to adjust schedule to allow 1 trip to [facility] per day.    Pt accompanied by: self  PERTINENT HISTORY:  Pt is 82 y.o. male who has experienced a decline in his overall functional mobility and stability since Covid and being unable to go back to the gym.  Pt notes that he has noticed it being increasingly difficult performing tasks without losing his balance and just feels generalized weakness.  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    PAIN: Are you having pain? No  PRECAUTIONS: None  WEIGHT BEARING RESTRICTIONS: No  FALLS: Has patient fallen in last 6 months? No  PLOF: Independent; pt does state he has difficulty tying shoes, but he has slip on shoes that assist with that.  PATIENT GOALS: Pt wants to be more mobile and be able to exercise.  OBJECTIVE:    DIAGNOSTIC FINDINGS:    EXAM 05/29/22: MRI HEAD WITHOUT CONTRAST   IMPRESSION: 1. No acute intracranial abnormality. 2. 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 3. Small chronic right cerebellar infarct.   COGNITION: Overall cognitive status: Within functional limits for tasks assessed             SENSATION: [facility]  COORDINATION: WFL   POSTURE: rounded shoulders and forward head     LOWER  EXTREMITY MMT:     MMT Right Eval Left Eval  Hip flexion 5 4+  Hip abduction 4+ 4+  Hip adduction 4+ 4+  Knee flexion 5 4+  Knee extension 5 4+  Ankle dorsiflexion 5 5  (Blank rows = not tested)     TRANSFERS: Assistive device utilized: Single point cane  Sit to stand: Modified independence Stand to sit: Modified independence Chair to chair: Modified independence     GAIT: Gait pattern: step through pattern, decreased arm swing- Right, decreased arm swing- Left, decreased stride length, and lateral lean- Left Distance walked: 1860' Assistive device utilized: Single point cane Level of assistance: Modified independence with AD Comments: Pt able to ambulate fair with some abnormalities in his gait pattern.   FUNCTIONAL TESTS:  5 times sit to stand: 11.81. 4/9 11.7 Timed up and go (TUG): 12.98 4/9 10.4 6 minute walk test: Not performed at evaluation: 3/28 805 10 meter walk test: 0.84 m/s  Berg Balance Scale: 47  FGA15/30 4/9 18/30    PATIENT SURVEYS:  FOTO 58. 59    TODAY'S TREATMENT: DATE: 10/27/22   NUstep reciprocal movement training. X 6 min level 0-2 with cues for full ROM on the LLE. Pt reports decreased pain with increased time on the nustep in the L knee.   Patient demonstrates increased fall risk as noted by score of 18/30 on  Functional Gait Assessment.   <22/30 = predictive of falls, <20/30 = fall in 6 months, <18/30 = predictive of falls in PD MCID: 5 points stroke population, 4 points geriatric population (ANPTA Core Set of Outcome Measures for Adults with Neurologic Conditions, 2018)  PT instructed pt in TUG: 10.4 sec (average of 3 trials; >13.5 sec indicates increased fall risk)  Pt performed 5 time sit<>stand (5xSTS): 11.7 sec (>15 sec indicates increased fall risk)    PATIENT EDUCATION: Education details: Pt educated on role of PT and services provided during current POC, along with prognosis and information about the clinic. Pt educated throughout  session about proper posture and technique with exercises. Improved exercise technique, movement at target joints, use of target muscles after min to mod verbal, visual, tactile cues.   Person educated: Patient Education method: Explanation Education comprehension: verbalized understanding  HOME EXERCISE PROGRAM:  Access Code: [email or web address] URL: [email or web address] Date: 08/17/2022 Prepared by: Tomasa HoseJosh Robbins  Exercises - Clamshell with Resistance  - 2 x daily - 7 x weekly - 2 sets - 10 reps - Sidelying Reverse Clamshell with Resistance  - 2 x daily - 7 x weekly - 2 sets - 10 reps - Prone Hip Extension with Resistance Loop  - 2 x daily - 7 x weekly - 2 sets - 10 reps - Supine Bridge with Resistance Band  - 2 x daily - 7 x weekly - 2 sets - 10 reps - Side Stepping with Resistance at Thighs  - 2 x daily - 7 x weekly - 2 sets - 10 reps  GOALS:  Goals reviewed with patient? Yes  SHORT TERM GOALS: Target date: 08/31/2022  Pt will be independent with HEP in order to demonstrate increased ability to perform tasks related to occupation/hobbies. Baseline: Pt given HEP 08/13/22 and noted above Goal status: IN PROGRESS   LONG TERM GOALS: Target date: 01/26/2023    1.  Patient (> 80 years old) will complete five times sit to stand test in < 10 seconds  indicating an increased LE strength and improved balance. Baseline: 11.81 sec 09/16/22: Not attempted 4/9: 11.7sec no UE sUpport Goal status: INITIAL  2.  Patient will increase FOTO score to equal to or greater than 64 to demonstrate statistically significant improvement in mobility and quality of life.  Baseline: FOTO - 58 09/16/22: 56. 11/03/22: 59   Goal status: IN PROGRESS   3.  Patient will increase Berg Balance score by > 6 points to demonstrate decreased fall risk during functional activities. Baseline: 47 09/16/22: 54 Goal status: MET   4.  Patient will reduce timed up and go to <11 seconds to reduce fall risk and  demonstrate improved transfer/gait ability. Baseline: 12.98 sec 09/16/22: 11.96 sec 3/28 11.45 4/9 10.4sec with SPC Goal status: MET  5.  Patient will increase 10 meter walk test to >1.55m/s as to improve gait speed for better community ambulation and to reduce fall risk. Baseline: 11.95 sec/0.84 m/s 09/16/22: 9.68 sec/1.033 m/s Goal status: MET  6.  Patient will increase six minute walk test distance to >1000 for progression to community ambulator and improve gait ability Baseline: 875 ft with [facility] 09/16/22: 917 ft with no AD 3/28: 857ft  Goal status: IN PROGRESS   7.  Patient will increase FGA score by > 4 points to demonstrate decreased fall risk during functional activities. Baseline: 15/30 4/9: 18/30 Goal status:In Progress.    ASSESSMENT:  CLINICAL IMPRESSION: Pt tolerates session well today. Pt repports decreased pain in the LLE on this day allowing PT treatment to focus on dynamic balance and functional assessment. Pt demonstrating improved function with reduced time 5xSTS to 11., on TUG meeting goal of <11sec. Improved score on FGA to 18/30, but still below goal of 19/30 to demonstrate reduce fall risk. Pt performed 6 min walk test with reduced distance on prior session than initial Eval, and still maxing progress towards FOTO goal of >64.  Maximum improvement is yet to be obtained. The anticipated improvement is attainable and reasonable in a generally predictable time. Continues to progress toward goals of care overall, but still warrants skilled PT interventions to reduce pain, improve QoL and allow return to PLOF.         OBJECTIVE IMPAIRMENTS: Abnormal gait, decreased activity tolerance, decreased balance, decreased endurance, decreased mobility, difficulty walking, decreased strength, and decreased safety awareness.   ACTIVITY LIMITATIONS: carrying, lifting, bending, sitting, squatting, and locomotion level  PARTICIPATION LIMITATIONS: community activity and yard  work  PERSONAL FACTORS: Age, Fitness, Past/current experiences, Time since onset of injury/illness/exacerbation, and 3+ comorbidities: DM2, Psoriasis, Prostate Cancer, GI complications, SOB on exertion  are also affecting patient's functional outcome.   REHAB POTENTIAL: Fair pt has significant influx of comorbidities that will likely impact their potential progress with PT.  CLINICAL DECISION MAKING: Stable/uncomplicated  EVALUATION COMPLEXITY: Moderate  PLAN:  PT FREQUENCY: 2x/week  PT DURATION: 12 weeks  PLANNED INTERVENTIONS: Therapeutic exercises, Therapeutic activity, Neuromuscular re-education, Balance training, Gait training, Patient/Family education, Self Care, Joint mobilization, Joint manipulation, Stair training, Vestibular training, Canalith repositioning, Dry Needling, Electrical stimulation, Spinal manipulation, Spinal mobilization, Cryotherapy, Moist heat, and Manual therapy  PLAN FOR NEXT SESSION:   Continue to address strength and balance deficits and manual therapy for pain in the L hip as appropriate.   Grier Rocher PT, DPT  Physical Therapist - [place]  [facility]  2:37 PM 10/27/22

## 2022-11-05 ENCOUNTER — Ambulatory Visit: Payer: Medicare Other | Admitting: Physical Therapy

## 2022-11-05 DIAGNOSIS — R262 Difficulty in walking, not elsewhere classified: Secondary | ICD-10-CM | POA: Diagnosis not present

## 2022-11-05 DIAGNOSIS — M6281 Muscle weakness (generalized): Secondary | ICD-10-CM

## 2022-11-05 NOTE — Addendum Note (Signed)
Addended by: Golden Pop on: 11/05/2022 08:44 AM   Modules accepted: Orders

## 2022-11-05 NOTE — Therapy (Signed)
OUTPATIENT PHYSICAL THERAPY NEURO TREATMENT     Patient Name: Brian Callahan MRN: [id number] DOB:10/01/1940, 82 y.o., male Today's Date: 10/27/2022   PCP: Farrel GobbleJeffrey Spark, MD REFERRING PROVIDER: Theora MasterZachary Potter, MD  END OF SESSION:   PT End of Session - 11/05/22 1032     Visit Number 22    Number of Visits 44    Date for PT Re-Evaluation 01/19/23    Authorization Type [employer]    Progress Note Due on Visit 30    PT Start Time 1028    PT Stop Time 1103    PT Time Calculation (min) 35 min    Equipment Utilized During Treatment Gait belt    Activity Tolerance Patient tolerated treatment well;No increased pain    Behavior During Therapy [facility] for tasks assessed/performed                   Past Medical History:  Diagnosis Date   Anemia    pt denies 01/18/20   BP (high blood pressure) 05/23/2015   BPH with obstruction/lower urinary tract symptoms    COPD (chronic obstructive pulmonary disease)    Diabetes mellitus without complication    diet controlled   Dyspnea    Encounter for long-term (current) use of other high-risk medications    GERD (gastroesophageal reflux disease)    gastritis   GI bleed    Gout    H/O hernia repair mid-90s   x2   History of elevated PSA    History of [place] spotted fever    HLD (hyperlipidemia)    HTN (hypertension)    Hyperglycemia    Hyperuricemia    Incomplete bladder emptying    Macular degeneration    Obesity    Psoriasis    Psoriasis    Urinary frequency    Past Surgical History:  Procedure Laterality Date   APPENDECTOMY     BACK SURGERY     neck and spine 2007   CERVICAL SPINE SURGERY  2007   CHOLECYSTECTOMY N/A 10/18/2017   Procedure: LAPAROSCOPIC CHOLECYSTECTOMY;  Surgeon: Carolan Shiverintron-Diaz, Edgardo, MD;  Location: [facility] ORS;  Service: General;  Laterality: N/A;   COLECTOMY  1998   partial, due to obstruction   COLONOSCOPY     lost 10 units of blood   COLONOSCOPY WITH PROPOFOL N/A 09/27/2019   Procedure:  COLONOSCOPY WITH PROPOFOL;  Surgeon: [name], Boykin Nearingeodoro K, MD;  Location: [facility] ENDOSCOPY;  Service: Gastroenterology;  Laterality: N/A;   CYSTOSCOPY WITH INSERTION OF UROLIFT N/A 01/23/2020   Procedure: CYSTOSCOPY WITH INSERTION OF UROLIFT;  Surgeon: Riki AltesStoioff, Scott C, MD;  Location: [facility] ORS;  Service: Urology;  Laterality: N/A;   ESOPHAGOGASTRODUODENOSCOPY N/A 10/14/2016   Procedure: ESOPHAGOGASTRODUODENOSCOPY (EGD);  Surgeon: Scot Junobert T Elliott, MD;  [facility];  Service: Endoscopy;  Laterality: N/A;   ESOPHAGOGASTRODUODENOSCOPY (EGD) WITH PROPOFOL N/A 09/27/2019   Procedure: ESOPHAGOGASTRODUODENOSCOPY (EGD) WITH PROPOFOL;  Surgeon: [name], Boykin Nearingeodoro K, MD;  Location: [facility] ENDOSCOPY;  Service: Gastroenterology;  Laterality: N/A;   EYE SURGERY     bilateral cataracts   HERNIA REPAIR  mid-90s   x2  inguinal   PROSTATE BIOPSY  2013   SURGERY SCROTAL / TESTICULAR     for fibrous growth   Patient Active Problem List   Diagnosis Date Noted   Diabetes mellitus type 2 with complications 03/23/2018   Chest pain with moderate risk for cardiac etiology 11/29/2017   Positive cardiac stress test 11/29/2017   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 11/08/2017  Esophagitis, reflux 11/08/2017   SOB (shortness of breath) on exertion 07/02/2017   BPH with obstruction/lower urinary tract symptoms 11/21/2015   History of elevated PSA 11/21/2015   H/O gastric ulcer 09/05/2015   Disorder of eyeball 05/23/2015   H/O infectious disease 05/23/2015   Blood glucose elevated 05/23/2015   BP (high blood pressure) 05/23/2015   HLD (hyperlipidemia) 05/23/2015   Psoriasis 05/23/2015   Absolute anemia 01/02/2014   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   Elevated prostate specific antigen (PSA) 01/02/2014   Elevated blood uric acid level 01/02/2014   Enlarged prostate without lower urinary tract symptoms (luts) 01/02/2014   Bleeding gastrointestinal 04/15/2012    ONSET DATE: ~10 years  REFERRING DIAG:   R26.89 (ICD-10-CM) - Balance problem  R26.9 (ICD-10-CM) - Gait abnormality    THERAPY DIAG:  Difficulty in walking, not elsewhere classified  Muscle weakness (generalized)  Rationale for Evaluation and Treatment: Rehabilitation   SUBJECTIVE:                                                                                                                                                                                             SUBJECTIVE STATEMENT: Pt reports that he is in a little pain today in the L knee and hip. But reports that he had a good night's sleep after taking multiple pain meds and sleeping aides. Mild "grogginess" after taking sleep aides this morning    Pt accompanied by: self  PERTINENT HISTORY:  Pt is 82 y.o. male who has experienced a decline in his overall functional mobility and stability since Covid and being unable to go back to the gym.  Pt notes that he has noticed it being increasingly difficult performing tasks without losing his balance and just feels generalized weakness.  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    PAIN: Are you having pain? No  PRECAUTIONS: None  WEIGHT BEARING RESTRICTIONS: No  FALLS: Has patient fallen in last 6 months? No  PLOF: Independent; pt does state he has difficulty tying shoes, but he has slip on shoes that assist with that.  PATIENT GOALS: Pt wants to be more mobile and be able to exercise.  OBJECTIVE:    DIAGNOSTIC FINDINGS:    EXAM 05/29/22: MRI HEAD WITHOUT CONTRAST   IMPRESSION: 1. No acute intracranial abnormality. 2. 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 3. Small chronic right cerebellar infarct.   COGNITION: Overall cognitive status: Within functional limits for  tasks assessed             SENSATION: WFL   COORDINATION: WFL    POSTURE: rounded shoulders and forward head     LOWER EXTREMITY MMT:     MMT Right Eval Left Eval  Hip flexion 5 4+  Hip abduction 4+ 4+  Hip adduction 4+ 4+  Knee flexion 5 4+  Knee extension 5 4+  Ankle dorsiflexion 5 5  (Blank rows = not tested)     TRANSFERS: Assistive device utilized: Single point cane  Sit to stand: Modified independence Stand to sit: Modified independence Chair to chair: Modified independence     GAIT: Gait pattern: step through pattern, decreased arm swing- Right, decreased arm swing- Left, decreased stride length, and lateral lean- Left Distance walked: 72' Assistive device utilized: Single point cane Level of assistance: Modified independence with AD Comments: Pt able to ambulate fair with some abnormalities in his gait pattern.   FUNCTIONAL TESTS:  5 times sit to stand: 11.81. 4/9 11.7 Timed up and go (TUG): 12.98 4/9 10.4 6 minute walk test: Not performed at evaluation: 3/28 805 10 meter walk test: 0.84 m/s  Berg Balance Scale: 47  FGA15/30 4/9 18/30    PATIENT SURVEYS:  FOTO 58. 59    TODAY'S TREATMENT: DATE: 10/27/22   NUstep reciprocal movement training. X 4 min level 0-2 with cues for full ROM on the LLE. Pain in the L knee limits increased time on nustep on this day.   Manual therapy.  LLE longitudinal distraction x 1 min at hip, 1 min at knee.  Lateral grade 2-3 mobs to the hip in 45 deg flexion x 45 sec then 20 deg flexion x 45 sec AP grade 2-3 mobs to the L knee 3 x 30 sec  PA grade 2-3 mobs to the L knee 2 x 30 sec   Supine therex Isometric hip adduction 2x 12 with 3 sec hold Bridge with isometric adduction x 6 Bridge with hip abduction x 6 GTB  Hip abduction GTB 2 x 12  Sitting LAQ 4# ankle weight x 8 bil.   Pt reports pain in the L knee and hip have resolved at end or treatment.    PATIENT EDUCATION: Education details: Pt educated on role of PT and services provided during current POC, along with prognosis and  information about the clinic. Pt educated throughout session about proper posture and technique with exercises. Improved exercise technique, movement at target joints, use of target muscles after min to mod verbal, visual, tactile cues.   Person educated: Patient Education method: Explanation Education comprehension: verbalized understanding  HOME EXERCISE PROGRAM:  Access Code: [email or web address] URL: [email or web address] Date: 08/17/2022 Prepared by: Tomasa Hose  Exercises - Clamshell with Resistance  - 2 x daily - 7 x weekly - 2 sets - 10 reps - Sidelying Reverse Clamshell with Resistance  - 2 x daily - 7 x weekly - 2 sets - 10 reps - Prone Hip Extension with Resistance Loop  - 2 x daily - 7 x weekly - 2 sets - 10 reps - Supine Bridge with Resistance Band  - 2 x daily - 7 x weekly - 2 sets - 10 reps - Side Stepping with Resistance at Thighs  - 2 x daily - 7 x weekly - 2 sets - 10 reps  GOALS:  Goals reviewed with patient? Yes  SHORT TERM GOALS: Target date: 08/31/2022  Pt will be independent with HEP in order to demonstrate increased ability  to perform tasks related to occupation/hobbies. Baseline: Pt given HEP 08/13/22 and noted above Goal status: IN PROGRESS   LONG TERM GOALS: Target date: 01/26/2023    1.  Patient (> 78 years old) will complete five times sit to stand test in < 10 seconds indicating an increased LE strength and improved balance. Baseline: 11.81 sec 09/16/22: Not attempted 4/9: 11.7sec no UE sUpport Goal status: INITIAL  2.  Patient will increase FOTO score to equal to or greater than 64 to demonstrate statistically significant improvement in mobility and quality of life.  Baseline: FOTO - 58 09/16/22: 56. 11/03/22: 59   Goal status: IN PROGRESS   3.  Patient will increase Berg Balance score by > 6 points to demonstrate decreased fall risk during functional activities. Baseline: 47 09/16/22: 54 Goal status: MET   4.  Patient will reduce timed up  and go to <11 seconds to reduce fall risk and demonstrate improved transfer/gait ability. Baseline: 12.98 sec 09/16/22: 11.96 sec 3/28 11.45 4/9 10.4sec with SPC Goal status: MET  5.  Patient will increase 10 meter walk test to >1.84m/s as to improve gait speed for better community ambulation and to reduce fall risk. Baseline: 11.95 sec/0.84 m/s 09/16/22: 9.68 sec/1.033 m/s Goal status: MET  6.  Patient will increase six minute walk test distance to >1000 for progression to community ambulator and improve gait ability Baseline: 875 ft with [facility] 09/16/22: 917 ft with no AD 3/28: 834ft  Goal status: IN PROGRESS   7.  Patient will increase FGA score by > 4 points to demonstrate decreased fall risk during functional activities. Baseline: 15/30 4/9: 18/30 Goal status:In Progress.    ASSESSMENT:  CLINICAL IMPRESSION: Pt tolerates session well today. Pt reports increased L hip and knee pain today, limiting dynamic balance training. PT treatment focused on pain control and BLE strengthening. Pt reports decreased pain in the hip and no pain in the knee at end of session.  Continues to progress toward goals of care overall, but still warrants skilled PT interventions to reduce pain, improve QoL and allow return to PLOF.         OBJECTIVE IMPAIRMENTS: Abnormal gait, decreased activity tolerance, decreased balance, decreased endurance, decreased mobility, difficulty walking, decreased strength, and decreased safety awareness.   ACTIVITY LIMITATIONS: carrying, lifting, bending, sitting, squatting, and locomotion level  PARTICIPATION LIMITATIONS: community activity and yard work  PERSONAL FACTORS: Age, Fitness, Past/current experiences, Time since onset of injury/illness/exacerbation, and 3+ comorbidities: DM2, Psoriasis, Prostate Cancer, GI complications, SOB on exertion  are also affecting patient's functional outcome.   REHAB POTENTIAL: Fair pt has significant influx of comorbidities that will  likely impact their potential progress with PT.  CLINICAL DECISION MAKING: Stable/uncomplicated  EVALUATION COMPLEXITY: Moderate  PLAN:  PT FREQUENCY: 2x/week  PT DURATION: 12 weeks  PLANNED INTERVENTIONS: Therapeutic exercises, Therapeutic activity, Neuromuscular re-education, Balance training, Gait training, Patient/Family education, Self Care, Joint mobilization, Joint manipulation, Stair training, Vestibular training, Canalith repositioning, Dry Needling, Electrical stimulation, Spinal manipulation, Spinal mobilization, Cryotherapy, Moist heat, and Manual therapy  PLAN FOR NEXT SESSION:   Continue to address strength and balance deficits and manual therapy for pain in the L hip as appropriate.   Grier Rocher PT, DPT  Physical Therapist - [place]  [facility]  2:37 PM 10/27/22

## 2022-11-06 ENCOUNTER — Other Ambulatory Visit: Payer: Self-pay | Admitting: *Deleted

## 2022-11-06 DIAGNOSIS — C61 Malignant neoplasm of prostate: Secondary | ICD-10-CM

## 2022-11-09 ENCOUNTER — Ambulatory Visit: Payer: Medicare Other

## 2022-11-09 ENCOUNTER — Ambulatory Visit
Admission: RE | Admit: 2022-11-09 | Discharge: 2022-11-09 | Disposition: A | Discharge status: Home / Self Care | Payer: Medicare Other | Source: Ambulatory Visit | Attending: Radiation Oncology | Admitting: Radiation Oncology

## 2022-11-10 ENCOUNTER — Ambulatory Visit: Payer: Medicare Other | Admitting: Physical Therapy

## 2022-11-10 ENCOUNTER — Ambulatory Visit: Payer: Medicare Other

## 2022-11-10 DIAGNOSIS — R262 Difficulty in walking, not elsewhere classified: Secondary | ICD-10-CM

## 2022-11-10 DIAGNOSIS — M6281 Muscle weakness (generalized): Secondary | ICD-10-CM

## 2022-11-10 NOTE — Therapy (Addendum)
OUTPATIENT PHYSICAL THERAPY NEURO TREATMENT     Patient Name: Brian Callahan MRN: [id number] DOB:Sep 06, 1940, 82 y.o., male Today's Date: 11/10/2022   PCP: Farrel Gobble, MD REFERRING PROVIDER: Theora Master, MD  END OF SESSION:   PT End of Session -     Visit Number 22    Number of Visits 44    Date for PT Re-Evaluation 01/19/2023    Authorization Type [employer]    Progress Note Due on Visit 30    PT Start Time PT end Time  1431  1514   Equipment Utilized During Treatment Gait belt    Activity Tolerance Patient tolerated treatment well;No increased pain    Behavior During Therapy [facility] for tasks assessed/performed           Past Medical History:  Diagnosis Date   Anemia    pt denies 01/18/20   BP (high blood pressure) 05/23/2015   BPH with obstruction/lower urinary tract symptoms    COPD (chronic obstructive pulmonary disease)    Diabetes mellitus without complication    diet controlled   Dyspnea    Encounter for long-term (current) use of other high-risk medications    GERD (gastroesophageal reflux disease)    gastritis   GI bleed    Gout    H/O hernia repair mid-90s   x2   History of elevated PSA    History of [place] spotted fever    HLD (hyperlipidemia)    HTN (hypertension)    Hyperglycemia    Hyperuricemia    Incomplete bladder emptying    Macular degeneration    Obesity    Psoriasis    Psoriasis    Urinary frequency    Past Surgical History:  Procedure Laterality Date   APPENDECTOMY     BACK SURGERY     neck and spine 2007   CERVICAL SPINE SURGERY  2007   CHOLECYSTECTOMY N/A 10/18/2017   Procedure: LAPAROSCOPIC CHOLECYSTECTOMY;  Surgeon: Carolan Shiver, MD;  Location: [facility] ORS;  Service: General;  Laterality: N/A;   COLECTOMY  1998   partial, due to obstruction   COLONOSCOPY     lost 10 units of blood   COLONOSCOPY WITH PROPOFOL N/A 09/27/2019   Procedure: COLONOSCOPY WITH PROPOFOL;  Surgeon: [name], Boykin Nearing, MD;   Location: [facility] ENDOSCOPY;  Service: Gastroenterology;  Laterality: N/A;   CYSTOSCOPY WITH INSERTION OF UROLIFT N/A 01/23/2020   Procedure: CYSTOSCOPY WITH INSERTION OF UROLIFT;  Surgeon: Riki Altes, MD;  Location: [facility] OR[facility];  Service: Urology;  Laterality: N/A;   ESOPHAGOGASTRODUODENOSCOPY N/A 10/14/2016   Procedure: ESOPHAGOGASTRODUODENOSCOPY (EGD);  Surgeon: Scot Jun, MD;  Location: [facility];  Service: Endoscopy;  Laterality: N/A;   ESOPHAGOGASTRODUODENOSCOPY (EGD) WITH PROPOFOL N/A 09/27/2019   Procedure: ESOPHAGOGASTRODUODENOSCOPY (EGD) WITH PROPOFOL;  Surgeon: [name], Boykin Nearing, MD;  Location: [facility] ENDOSCOPY;  Service: Gastroenterology;  Laterality: N/A;   EYE SURGERY     bilateral cataracts   HERNIA REPAIR  mid-90s   x2  inguinal   PROSTATE BIOPSY  2013   SURGERY SCROTAL / TESTICULAR     for fibrous growth   Patient Active Problem List   Diagnosis Date Noted   Diabetes mellitus type 2 with complications 03/23/2018   Chest pain with moderate risk for cardiac etiology 11/29/2017   Positive cardiac stress test 11/29/2017   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 11/08/2017   Esophagitis, reflux 11/08/2017   SOB (shortness of breath) on exertion 07/02/2017   BPH with obstruction/lower urinary tract symptoms  11/21/2015   History of elevated PSA 11/21/2015   H/O gastric ulcer 09/05/2015   Disorder of eyeball 05/23/2015   H/O infectious disease 05/23/2015   Blood glucose elevated 05/23/2015   BP (high blood pressure) 05/23/2015   HLD (hyperlipidemia) 05/23/2015   Psoriasis 05/23/2015   Absolute anemia 01/02/2014   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   Elevated prostate specific antigen (PSA) 01/02/2014   Elevated blood uric acid level 01/02/2014   Enlarged prostate without lower urinary tract symptoms (luts) 01/02/2014   Bleeding gastrointestinal 04/15/2012    ONSET DATE: ~10 years  REFERRING DIAG:  R26.89 (ICD-10-CM) - Balance problem  R26.9 (ICD-10-CM) - Gait  abnormality    THERAPY DIAG:  Difficulty in walking, not elsewhere classified  Muscle weakness (generalized)  Rationale for Evaluation and Treatment: Rehabilitation   SUBJECTIVE:                                                                                                                                                                                             SUBJECTIVE STATEMENT: Pt reports that he is in a little pain today in the L knee and hip. But reports that he had a good night's sleep after taking multiple pain meds and sleeping aides. Mild "grogginess" after taking sleep aides this morning    Pt accompanied by: self  PERTINENT HISTORY:  Pt is 82 y.o. male who has experienced a decline in his overall functional mobility and stability since Covid and being unable to go back to the gym.  Pt notes that he has noticed it being increasingly difficult performing tasks without losing his balance and just feels generalized weakness.  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    PAIN: Are you having pain? No  PRECAUTIONS: None  WEIGHT BEARING RESTRICTIONS: No  FALLS: Has patient fallen in last 6 months? No  PLOF: Independent; pt does state he has difficulty tying shoes, but he has slip on shoes that assist with that.  PATIENT GOALS: Pt wants to be more mobile and be able to exercise.  OBJECTIVE:    DIAGNOSTIC FINDINGS:    EXAM 05/29/22: MRI HEAD WITHOUT CONTRAST   IMPRESSION: 1. No acute intracranial abnormality. 2. 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 3. Small chronic right cerebellar infarct.   COGNITION: Overall cognitive status: Within functional limits for tasks assessed             SENSATION: WFL   COORDINATION: [facility]  POSTURE: rounded shoulders and forward head     LOWER  EXTREMITY MMT:     MMT Right Eval Left Eval  Hip flexion 5 4+  Hip abduction 4+ 4+  Hip adduction 4+ 4+  Knee flexion 5 4+  Knee extension 5 4+  Ankle dorsiflexion 5 5  (Blank rows = not tested)     TRANSFERS: Assistive device utilized: Single point cane  Sit to stand: Modified independence Stand to sit: Modified independence Chair to chair: Modified independence     GAIT: Gait pattern: step through pattern, decreased arm swing- Right, decreased arm swing- Left, decreased stride length, and lateral lean- Left Distance walked: 68' Assistive device utilized: Single point cane Level of assistance: Modified independence with AD Comments: Pt able to ambulate fair with some abnormalities in his gait pattern.   FUNCTIONAL TESTS:  5 times sit to stand: 11.81. 4/9 11.7 Timed up and go (TUG): 12.98 4/9 10.4 6 minute walk test: Not performed at evaluation: 3/28 805 10 meter walk test: 0.84 m/s  Berg Balance Scale: 47  FGA15/30 4/9 18/30    PATIENT SURVEYS:  FOTO 58. 59    TODAY'S TREATMENT: DATE: 11/10/22  TE NUstep reciprocal movement training. X 6 min level 0-2 with cues for full ROM on the LLE. Pain in the L knee limits increased time on nustep on this day.   Manual therapy.  LLE longitudinal distraction x 1 min at hip, 1 min at knee.  Lateral grade 2-3 mobs to the hip in 45 deg flexion x 45 sec then 20 deg flexion x 45 sec AP grade 2-3 mobs to the L knee 2 x 30 sec  PA grade 2-3 mobs to the L knee 2 x 30 sec    NMR Heel raises 3 x 10 no UE, near LOB on first set but improved balance with practice.  1 LE on floor 1 on step 2 x 45 sec ea LE  Standing on rocker board 3 x 45 seconds   Pt required occasional rest breaks to prevent excessive pressure and stress to L knee and hip.    Pt reports pain in the L knee pain is much better post treatment per usual.   PATIENT EDUCATION: Education details: Pt educated on role of PT and services provided during current POC,  along with prognosis and information about the clinic. Pt educated throughout session about proper posture and technique with exercises. Improved exercise technique, movement at target joints, use of target muscles after min to mod verbal, visual, tactile cues.   Person educated: Patient Education method: Explanation Education comprehension: verbalized understanding  HOME EXERCISE PROGRAM:  Access Code: [email or web address] URL: [email or web address] Date: 08/17/2022 Prepared by: Tomasa Hose  Exercises - Clamshell with Resistance  - 2 x daily - 7 x weekly - 2 sets - 10 reps - Sidelying Reverse Clamshell with Resistance  - 2 x daily - 7 x weekly - 2 sets - 10 reps - Prone Hip Extension with Resistance Loop  - 2 x daily - 7 x weekly - 2 sets - 10 reps - Supine Bridge with Resistance Band  - 2 x daily - 7 x weekly - 2 sets - 10 reps - Side Stepping with Resistance at Thighs  - 2 x daily - 7 x weekly - 2 sets - 10 reps  GOALS:  Goals reviewed with patient? Yes  SHORT TERM GOALS: Target date: 08/31/2022  Pt will be independent with HEP in order to demonstrate increased ability to perform  tasks related to occupation/hobbies. Baseline: Pt given HEP 08/13/22 and noted above Goal status: IN PROGRESS   LONG TERM GOALS: Target date: 01/26/2023    1.  Patient (> 33 years old) will complete five times sit to stand test in < 10 seconds indicating an increased LE strength and improved balance. Baseline: 11.81 sec 09/16/22: Not attempted 4/9: 11.7sec no UE sUpport Goal status: INITIAL  2.  Patient will increase FOTO score to equal to or greater than 64 to demonstrate statistically significant improvement in mobility and quality of life.  Baseline: FOTO - 58 09/16/22: 56. 11/03/22: 59   Goal status: IN PROGRESS   3.  Patient will increase Berg Balance score by > 6 points to demonstrate decreased fall risk during functional activities. Baseline: 47 09/16/22: 54 Goal status: MET   4.   Patient will reduce timed up and go to <11 seconds to reduce fall risk and demonstrate improved transfer/gait ability. Baseline: 12.98 sec 09/16/22: 11.96 sec 3/28 11.45 4/9 10.4sec with SPC Goal status: MET  5.  Patient will increase 10 meter walk test to >1.53m/s as to improve gait speed for better community ambulation and to reduce fall risk. Baseline: 11.95 sec/0.84 m/s 09/16/22: 9.68 sec/1.033 m/s Goal status: MET  6.  Patient will increase six minute walk test distance to >1000 for progression to community ambulator and improve gait ability Baseline: 875 ft with [facility] 09/16/22: 917 ft with no AD 3/28: 858ft  Goal status: IN PROGRESS   7.  Patient will increase FGA score by > 4 points to demonstrate decreased fall risk during functional activities. Baseline: 15/30 4/9: 18/30 Goal status:In Progress.    ASSESSMENT:  CLINICAL IMPRESSION: Pt tolerates session well today. Pt has pain in the L knee but not too bad today. Pt progresses with standing balance activities as he can tolerate today with rest breaks interspersed to prevent onset of knee or hip pain. Pt will continue to benefit from skilled physical therapy intervention to address impairments, improve QOL, and attain therapy goals. Pt continues to feel improvement in pain in his knee and hip post PT interventions.         OBJECTIVE IMPAIRMENTS: Abnormal gait, decreased activity tolerance, decreased balance, decreased endurance, decreased mobility, difficulty walking, decreased strength, and decreased safety awareness.   ACTIVITY LIMITATIONS: carrying, lifting, bending, sitting, squatting, and locomotion level  PARTICIPATION LIMITATIONS: community activity and yard work  PERSONAL FACTORS: Age, Fitness, Past/current experiences, Time since onset of injury/illness/exacerbation, and 3+ comorbidities: DM2, Psoriasis, Prostate Cancer, GI complications, SOB on exertion  are also affecting patient's functional outcome.   REHAB  POTENTIAL: Fair pt has significant influx of comorbidities that will likely impact their potential progress with PT.  CLINICAL DECISION MAKING: Stable/uncomplicated  EVALUATION COMPLEXITY: Moderate  PLAN:  PT FREQUENCY: 2x/week  PT DURATION: 12 weeks  PLANNED INTERVENTIONS: Therapeutic exercises, Therapeutic activity, Neuromuscular re-education, Balance training, Gait training, Patient/Family education, Self Care, Joint mobilization, Joint manipulation, Stair training, Vestibular training, Canalith repositioning, Dry Needling, Electrical stimulation, Spinal manipulation, Spinal mobilization, Cryotherapy, Moist heat, and Manual therapy  PLAN FOR NEXT SESSION:   Continue to address strength and balance deficits and manual therapy for pain in the L hip as appropriate.   Norman Herrlich PT ,DPT Physical Therapist- [facility]    2:35 PM 11/10/22

## 2022-11-11 ENCOUNTER — Ambulatory Visit: Payer: Medicare Other

## 2022-11-11 DIAGNOSIS — C61 Malignant neoplasm of prostate: Secondary | ICD-10-CM | POA: Diagnosis not present

## 2022-11-12 ENCOUNTER — Ambulatory Visit: Payer: Medicare Other

## 2022-11-12 ENCOUNTER — Ambulatory Visit: Payer: Medicare Other | Admitting: Physical Therapy

## 2022-11-12 ENCOUNTER — Encounter: Payer: Self-pay | Admitting: Physical Therapy

## 2022-11-12 ENCOUNTER — Ambulatory Visit
Admission: RE | Admit: 2022-11-12 | Discharge: 2022-11-12 | Disposition: A | Discharge status: Home / Self Care | Payer: Medicare Other | Source: Ambulatory Visit | Attending: Radiation Oncology | Admitting: Radiation Oncology

## 2022-11-12 ENCOUNTER — Other Ambulatory Visit: Payer: Self-pay

## 2022-11-12 DIAGNOSIS — R262 Difficulty in walking, not elsewhere classified: Secondary | ICD-10-CM | POA: Diagnosis not present

## 2022-11-12 DIAGNOSIS — C61 Malignant neoplasm of prostate: Secondary | ICD-10-CM | POA: Diagnosis not present

## 2022-11-12 DIAGNOSIS — M6281 Muscle weakness (generalized): Secondary | ICD-10-CM

## 2022-11-12 LAB — RAD ONC ARIA SESSION SUMMARY
Course Elapsed Days: 0
Plan Fractions Treated to Date: 1
Plan Prescribed Dose Per Fraction: 2.6 Gy
Plan Total Fractions Prescribed: 28
Plan Total Prescribed Dose: 72.8 Gy
Reference Point Dosage Given to Date: 2.6 Gy
Reference Point Session Dosage Given: 2.6 Gy
Session Number: 1

## 2022-11-12 NOTE — Therapy (Signed)
OUTPATIENT PHYSICAL THERAPY NEURO TREATMENT     Patient Name: Brian Callahan MRN: [id number] DOB:[birth year], 82 y.o., male Today's Date: 11/12/2022   PCP: Farrel Gobble, MD REFERRING PROVIDER: Theora Master, MD  END OF SESSION:   PT End of Session - 11/12/22 1139     Visit Number 24    Number of Visits 44    Date for PT Re-Evaluation 01/19/23    Authorization Type [employer]    Progress Note Due on Visit 30    PT Start Time 1143    PT Stop Time 1225    PT Time Calculation (min) 42 min    Equipment Utilized During Treatment Gait belt    Activity Tolerance Patient tolerated treatment well;No increased pain    Behavior During Therapy [facility] for tasks assessed/performed                    Past Medical History:  Diagnosis Date   Anemia    pt denies 01/18/20   BP (high blood pressure) 05/23/2015   BPH with obstruction/lower urinary tract symptoms    COPD (chronic obstructive pulmonary disease)    Diabetes mellitus without complication    diet controlled   Dyspnea    Encounter for long-term (current) use of other high-risk medications    GERD (gastroesophageal reflux disease)    gastritis   GI bleed    Gout    H/O hernia repair mid-90s   x2   History of elevated PSA    History of [place] spotted fever    HLD (hyperlipidemia)    HTN (hypertension)    Hyperglycemia    Hyperuricemia    Incomplete bladder emptying    Macular degeneration    Obesity    Psoriasis    Psoriasis    Urinary frequency    Past Surgical History:  Procedure Laterality Date   APPENDECTOMY     BACK SURGERY     neck and spine 2007   CERVICAL SPINE SURGERY  2007   CHOLECYSTECTOMY N/A 10/18/2017   Procedure: LAPAROSCOPIC CHOLECYSTECTOMY;  Surgeon: Carolan Shiver, MD;  Location: [facility] ORS;  Service: General;  Laterality: N/A;   COLECTOMY  1998   partial, due to obstruction   COLONOSCOPY     lost 10 units of blood   COLONOSCOPY WITH PROPOFOL N/A 09/27/2019   Procedure:  COLONOSCOPY WITH PROPOFOL;  Surgeon: [name], Boykin Nearing, MD;  Location: [facility] ENDOSCOPY;  Service: Gastroenterology;  Laterality: N/A;   CYSTOSCOPY WITH INSERTION OF UROLIFT N/A 01/23/2020   Procedure: CYSTOSCOPY WITH INSERTION OF UROLIFT;  Surgeon: Riki Altes, MD;  Location: [facility] ORS;  Service: Urology;  Laterality: N/A;   ESOPHAGOGASTRODUODENOSCOPY N/A 10/14/2016   Procedure: ESOPHAGOGASTRODUODENOSCOPY (EGD);  Surgeon: Scot Jun, MD;  Location: [facility] ENDOS[facility];  Service: Endoscopy;  Laterality: N/A;   ESOPHAGOGASTRODUODENOSCOPY (EGD) WITH PROPOFOL N/A 09/27/2019   Procedure: ESOPHAGOGASTRODUODENOSCOPY (EGD) WITH PROPOFOL;  Surgeon: [name], Boykin Nearing, MD;  Location: [facility] ENDOSCOPY;  Service: Gastroenterology;  Laterality: N/A;   EYE SURGERY     bilateral cataracts   HERNIA REPAIR  mid-90s   x2  inguinal   PROSTATE BIOPSY  2013   SURGERY SCROTAL / TESTICULAR     for fibrous growth   Patient Active Problem List   Diagnosis Date Noted   Diabetes mellitus type 2 with complications 03/23/2018   Chest pain with moderate risk for cardiac etiology 11/29/2017   Positive cardiac stress test 11/29/2017   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 11/08/2017  Esophagitis, reflux 11/08/2017   SOB (shortness of breath) on exertion 07/02/2017   BPH with obstruction/lower urinary tract symptoms 11/21/2015   History of elevated PSA 11/21/2015   H/O gastric ulcer 09/05/2015   Disorder of eyeball 05/23/2015   H/O infectious disease 05/23/2015   Blood glucose elevated 05/23/2015   BP (high blood pressure) 05/23/2015   HLD (hyperlipidemia) 05/23/2015   Psoriasis 05/23/2015   Absolute anemia 01/02/2014   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   Elevated prostate specific antigen (PSA) 01/02/2014   Elevated blood uric acid level 01/02/2014   Enlarged prostate without lower urinary tract symptoms (luts) 01/02/2014   Bleeding gastrointestinal 04/15/2012    ONSET DATE: ~10 years  REFERRING DIAG:   R26.89 (ICD-10-CM) - Balance problem  R26.9 (ICD-10-CM) - Gait abnormality    THERAPY DIAG:  Difficulty in walking, not elsewhere classified  Muscle weakness (generalized)  Rationale for Evaluation and Treatment: Rehabilitation   SUBJECTIVE:                                                                                                                                                                                             SUBJECTIVE STATEMENT:  Pt reports some pain in the hip in the day following treatment last session. No other changes and pain has subsided at this time.   Pt accompanied by: self  PERTINENT HISTORY:  Pt is 82 y.o. male who has experienced a decline in his overall functional mobility and stability since Covid and being unable to go back to the gym.  Pt notes that he has noticed it being increasingly difficult performing tasks without losing his balance and just feels generalized weakness.  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    PAIN: Are you having pain? No  PRECAUTIONS: None  WEIGHT BEARING RESTRICTIONS: No  FALLS: Has patient fallen in last 6 months? No  PLOF: Independent; pt does state he has difficulty tying shoes, but he has slip on shoes that assist with that.  PATIENT GOALS: Pt wants to be more mobile and be able to exercise.  OBJECTIVE:    DIAGNOSTIC FINDINGS:    EXAM 05/29/22: MRI HEAD WITHOUT CONTRAST   IMPRESSION: 1. No acute intracranial abnormality. 2. 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 3. Small chronic right cerebellar infarct.   COGNITION: Overall cognitive status: Within functional limits for tasks assessed             SENSATION: [facility]  COORDINATION: WFL   POSTURE: rounded shoulders and forward head     LOWER EXTREMITY MMT:     MMT  Right Eval Left Eval  Hip flexion 5 4+  Hip abduction 4+ 4+  Hip adduction 4+ 4+  Knee flexion 5 4+  Knee extension 5 4+  Ankle dorsiflexion 5 5  (Blank rows = not tested)     TRANSFERS: Assistive device utilized: Single point cane  Sit to stand: Modified independence Stand to sit: Modified independence Chair to chair: Modified independence     GAIT: Gait pattern: step through pattern, decreased arm swing- Right, decreased arm swing- Left, decreased stride length, and lateral lean- Left Distance walked: 51' Assistive device utilized: Single point cane Level of assistance: Modified independence with AD Comments: Pt able to ambulate fair with some abnormalities in his gait pattern.   FUNCTIONAL TESTS:  5 times sit to stand: 11.81. 4/9 11.7 Timed up and go (TUG): 12.98 4/9 10.4 6 minute walk test: Not performed at evaluation: 3/28 805 10 meter walk test: 0.84 m/s  Berg Balance Scale: 47  FGA15/30 4/9 18/30    PATIENT SURVEYS:  FOTO 58. 59    TODAY'S TREATMENT: DATE: 11/12/22  TE Ambulation style endurance training  2 rounds x 150 feet with 3# AW donned.  1 round x 300 ft ( around 225 feet visible deterioration of gait with decreased stance time on the L LE and slightly hunched posture   Heel raises seated with toes on 1/2 foam roller 2 x 10 with   Seated adduction ball squeeze 2 x 10 reps   NMR  1 LE on airex 1 on step 2 x 45 sec ea LE   Standing on rocker board 3 x 45 seconds   Heel raises without UE assist 3 x 12 reps   Slow march with focus on control of stance on ea LE   Pt required occasional rest breaks to prevent excessive pressure and stress to L knee and hip.     PATIENT EDUCATION:  Education details: Pt educated on role of PT and services provided during current POC, along with prognosis and information about the clinic. Pt educated throughout session about proper posture and technique with exercises. Improved exercise technique, movement at  target joints, use of target muscles after min to mod verbal, visual, tactile cues.   Person educated: Patient Education method: Explanation Education comprehension: verbalized understanding  HOME EXERCISE PROGRAM:  Access Code: [email or web address] URL: [email or web address] Date: 08/17/2022 Prepared by: Tomasa Hose  Exercises - Clamshell with Resistance  - 2 x daily - 7 x weekly - 2 sets - 10 reps - Sidelying Reverse Clamshell with Resistance  - 2 x daily - 7 x weekly - 2 sets - 10 reps - Prone Hip Extension with Resistance Loop  - 2 x daily - 7 x weekly - 2 sets - 10 reps - Supine Bridge with Resistance Band  - 2 x daily - 7 x weekly - 2 sets - 10 reps - Side Stepping with Resistance at Thighs  - 2 x daily - 7 x weekly - 2 sets - 10 reps  GOALS:  Goals reviewed with patient? Yes  SHORT TERM GOALS: Target date: 08/31/2022  Pt will be independent with HEP in order to demonstrate increased ability to perform tasks related to occupation/hobbies. Baseline: Pt given HEP 08/13/22 and noted above Goal status: IN PROGRESS   LONG TERM GOALS: Target date: 01/26/2023    1.  Patient (> 60 years  old) will complete five times sit to stand test in < 10 seconds indicating an increased LE strength and improved balance. Baseline: 11.81 sec 09/16/22: Not attempted 4/9: 11.7sec no UE sUpport Goal status: INITIAL  2.  Patient will increase FOTO score to equal to or greater than 64 to demonstrate statistically significant improvement in mobility and quality of life.  Baseline: FOTO - 58 09/16/22: 56. 11/03/22: 59   Goal status: IN PROGRESS   3.  Patient will increase Berg Balance score by > 6 points to demonstrate decreased fall risk during functional activities. Baseline: 47 09/16/22: 54 Goal status: MET   4.  Patient will reduce timed up and go to <11 seconds to reduce fall risk and demonstrate improved transfer/gait ability. Baseline: 12.98 sec 09/16/22: 11.96 sec 3/28 11.45 4/9 10.4sec  with SPC Goal status: MET  5.  Patient will increase 10 meter walk test to >1.27m/s as to improve gait speed for better community ambulation and to reduce fall risk. Baseline: 11.95 sec/0.84 m/s 09/16/22: 9.68 sec/1.033 m/s Goal status: MET  6.  Patient will increase six minute walk test distance to >1000 for progression to community ambulator and improve gait ability Baseline: 875 ft with [facility] 09/16/22: 917 ft with no AD 3/28: 884ft  Goal status: IN PROGRESS   7.  Patient will increase FGA score by > 4 points to demonstrate decreased fall risk during functional activities. Baseline: 15/30 4/9: 18/30 Goal status:In Progress.    ASSESSMENT:  CLINICAL IMPRESSION:   Patient presents with good motivation for completion of physical therapy activities.  Patient progresses with lower extremity strengthening and balance exercises with frequent check-in's on hip pain to ensure exacerbation is not cause during session.  Patient continues to have decreased stance time on the left lower extremity likely secondary to pain.  Patient is able to overcome this with slow marching without increase in pain. Pt will continue to benefit from skilled physical therapy intervention to address impairments, improve QOL, and attain therapy goals.         OBJECTIVE IMPAIRMENTS: Abnormal gait, decreased activity tolerance, decreased balance, decreased endurance, decreased mobility, difficulty walking, decreased strength, and decreased safety awareness.   ACTIVITY LIMITATIONS: carrying, lifting, bending, sitting, squatting, and locomotion level  PARTICIPATION LIMITATIONS: community activity and yard work  PERSONAL FACTORS: Age, Fitness, Past/current experiences, Time since onset of injury/illness/exacerbation, and 3+ comorbidities: DM2, Psoriasis, Prostate Cancer, GI complications, SOB on exertion  are also affecting patient's functional outcome.   REHAB POTENTIAL: Fair pt has significant influx of comorbidities  that will likely impact their potential progress with PT.  CLINICAL DECISION MAKING: Stable/uncomplicated  EVALUATION COMPLEXITY: Moderate  PLAN:  PT FREQUENCY: 2x/week  PT DURATION: 12 weeks  PLANNED INTERVENTIONS: Therapeutic exercises, Therapeutic activity, Neuromuscular re-education, Balance training, Gait training, Patient/Family education, Self Care, Joint mobilization, Joint manipulation, Stair training, Vestibular training, Canalith repositioning, Dry Needling, Electrical stimulation, Spinal manipulation, Spinal mobilization, Cryotherapy, Moist heat, and Manual therapy  PLAN FOR NEXT SESSION:   Continue to address strength and balance deficits and manual therapy for pain in the L hip as appropriate.   Norman Herrlich PT ,DPT Physical Therapist- [facility]    11:40 AM 11/12/22

## 2022-11-13 ENCOUNTER — Ambulatory Visit: Payer: Medicare Other

## 2022-11-13 ENCOUNTER — Other Ambulatory Visit: Payer: Self-pay

## 2022-11-13 ENCOUNTER — Ambulatory Visit
Admission: RE | Admit: 2022-11-13 | Discharge: 2022-11-13 | Disposition: A | Discharge status: Home / Self Care | Payer: Medicare Other | Source: Ambulatory Visit | Attending: Radiation Oncology | Admitting: Radiation Oncology

## 2022-11-13 DIAGNOSIS — C61 Malignant neoplasm of prostate: Secondary | ICD-10-CM | POA: Diagnosis not present

## 2022-11-13 LAB — RAD ONC ARIA SESSION SUMMARY
Course Elapsed Days: 1
Plan Fractions Treated to Date: 2
Plan Prescribed Dose Per Fraction: 2.6 Gy
Plan Total Fractions Prescribed: 28
Plan Total Prescribed Dose: 72.8 Gy
Reference Point Dosage Given to Date: 5.2 Gy
Reference Point Session Dosage Given: 2.6 Gy
Session Number: 2

## 2022-11-16 ENCOUNTER — Inpatient Hospital Stay: Payer: Medicare Other

## 2022-11-16 ENCOUNTER — Ambulatory Visit
Admission: RE | Admit: 2022-11-16 | Discharge: 2022-11-16 | Disposition: A | Discharge status: Home / Self Care | Payer: Medicare Other | Source: Ambulatory Visit | Attending: Radiation Oncology | Admitting: Radiation Oncology

## 2022-11-16 ENCOUNTER — Ambulatory Visit: Payer: Medicare Other

## 2022-11-16 ENCOUNTER — Other Ambulatory Visit: Payer: Self-pay

## 2022-11-16 DIAGNOSIS — C61 Malignant neoplasm of prostate: Secondary | ICD-10-CM | POA: Insufficient documentation

## 2022-11-16 LAB — RAD ONC ARIA SESSION SUMMARY
Course Elapsed Days: 4
Plan Fractions Treated to Date: 3
Plan Prescribed Dose Per Fraction: 2.6 Gy
Plan Total Fractions Prescribed: 28
Plan Total Prescribed Dose: 72.8 Gy
Reference Point Dosage Given to Date: 7.8 Gy
Reference Point Session Dosage Given: 2.6 Gy
Session Number: 3

## 2022-11-16 LAB — CBC (CANCER CENTER ONLY)
HCT: 38.6 % — ABNORMAL LOW (ref 39.0–52.0)
Hemoglobin: 12.7 g/dL — ABNORMAL LOW (ref 13.0–17.0)
MCH: 32.5 pg (ref 26.0–34.0)
MCHC: 32.9 g/dL (ref 30.0–36.0)
MCV: 98.7 fL (ref 80.0–100.0)
Platelet Count: 186 10*3/uL (ref 150–400)
RBC: 3.91 MIL/uL — ABNORMAL LOW (ref 4.22–5.81)
RDW: 15 % (ref 11.5–15.5)
WBC Count: 6.6 10*3/uL (ref 4.0–10.5)
nRBC: 0 % (ref 0.0–0.2)

## 2022-11-17 ENCOUNTER — Other Ambulatory Visit: Payer: Self-pay

## 2022-11-17 ENCOUNTER — Ambulatory Visit: Payer: Medicare Other

## 2022-11-17 ENCOUNTER — Ambulatory Visit
Admission: RE | Admit: 2022-11-17 | Discharge: 2022-11-17 | Disposition: A | Discharge status: Home / Self Care | Payer: Medicare Other | Source: Ambulatory Visit | Attending: Radiation Oncology | Admitting: Radiation Oncology

## 2022-11-17 ENCOUNTER — Ambulatory Visit: Payer: Medicare Other | Admitting: Physical Therapy

## 2022-11-17 DIAGNOSIS — C61 Malignant neoplasm of prostate: Secondary | ICD-10-CM | POA: Diagnosis not present

## 2022-11-17 LAB — RAD ONC ARIA SESSION SUMMARY
Course Elapsed Days: 5
Plan Fractions Treated to Date: 4
Plan Prescribed Dose Per Fraction: 2.6 Gy
Plan Total Fractions Prescribed: 28
Plan Total Prescribed Dose: 72.8 Gy
Reference Point Dosage Given to Date: 10.4 Gy
Reference Point Session Dosage Given: 2.6 Gy
Session Number: 4

## 2022-11-18 ENCOUNTER — Ambulatory Visit
Admission: RE | Admit: 2022-11-18 | Discharge: 2022-11-18 | Disposition: A | Discharge status: Home / Self Care | Payer: Medicare Other | Source: Ambulatory Visit | Attending: Radiation Oncology | Admitting: Radiation Oncology

## 2022-11-18 ENCOUNTER — Other Ambulatory Visit: Payer: Self-pay

## 2022-11-18 ENCOUNTER — Ambulatory Visit: Payer: Medicare Other

## 2022-11-18 DIAGNOSIS — C61 Malignant neoplasm of prostate: Secondary | ICD-10-CM | POA: Diagnosis not present

## 2022-11-18 LAB — RAD ONC ARIA SESSION SUMMARY
Course Elapsed Days: 6
Plan Fractions Treated to Date: 5
Plan Prescribed Dose Per Fraction: 2.6 Gy
Plan Total Fractions Prescribed: 28
Plan Total Prescribed Dose: 72.8 Gy
Reference Point Dosage Given to Date: 13 Gy
Reference Point Session Dosage Given: 2.6 Gy
Session Number: 5

## 2022-11-19 ENCOUNTER — Other Ambulatory Visit: Payer: Self-pay

## 2022-11-19 ENCOUNTER — Ambulatory Visit: Payer: Medicare Other

## 2022-11-19 ENCOUNTER — Ambulatory Visit
Admission: RE | Admit: 2022-11-19 | Discharge: 2022-11-19 | Disposition: A | Discharge status: Home / Self Care | Payer: Medicare Other | Source: Ambulatory Visit | Attending: Radiation Oncology | Admitting: Radiation Oncology

## 2022-11-19 ENCOUNTER — Ambulatory Visit: Payer: Medicare Other | Admitting: Physical Therapy

## 2022-11-19 DIAGNOSIS — M6281 Muscle weakness (generalized): Secondary | ICD-10-CM

## 2022-11-19 DIAGNOSIS — C61 Malignant neoplasm of prostate: Secondary | ICD-10-CM | POA: Diagnosis not present

## 2022-11-19 DIAGNOSIS — R262 Difficulty in walking, not elsewhere classified: Secondary | ICD-10-CM

## 2022-11-19 LAB — RAD ONC ARIA SESSION SUMMARY
Course Elapsed Days: 7
Plan Fractions Treated to Date: 6
Plan Prescribed Dose Per Fraction: 2.6 Gy
Plan Total Fractions Prescribed: 28
Plan Total Prescribed Dose: 72.8 Gy
Reference Point Dosage Given to Date: 15.6 Gy
Reference Point Session Dosage Given: 2.6 Gy
Session Number: 6

## 2022-11-19 NOTE — Therapy (Signed)
OUTPATIENT PHYSICAL THERAPY NEURO TREATMENT     Patient Name: Brian Callahan MRN: [id number] DOB:[birth year], 82 y.o., male Today's Date: 11/19/2022   PCP: Farrel Gobble, MD REFERRING PROVIDER: Theora Master, MD  END OF SESSION:   PT End of Session - 11/19/22 1152     Visit Number 25    Number of Visits 44    Date for PT Re-Evaluation 01/19/23    Authorization Type [employer] Medicare    Progress Note Due on Visit 30    PT Start Time 1150    Equipment Utilized During Treatment Gait belt    Activity Tolerance Patient tolerated treatment well;No increased pain    Behavior During Therapy [facility] for tasks assessed/performed                    Past Medical History:  Diagnosis Date   Anemia    pt denies 01/18/20   BP (high blood pressure) 05/23/2015   BPH with obstruction/lower urinary tract symptoms    COPD (chronic obstructive pulmonary disease)    Diabetes mellitus without complication    diet controlled   Dyspnea    Encounter for long-term (current) use of other high-risk medications    GERD (gastroesophageal reflux disease)    gastritis   GI bleed    Gout    H/O hernia repair mid-90s   x2   History of elevated PSA    History of [place] spotted fever    HLD (hyperlipidemia)    HTN (hypertension)    Hyperglycemia    Hyperuricemia    Incomplete bladder emptying    Macular degeneration    Obesity    Psoriasis    Psoriasis    Urinary frequency    Past Surgical History:  Procedure Laterality Date   APPENDECTOMY     BACK SURGERY     neck and spine 2007   CERVICAL SPINE SURGERY  2007   CHOLECYSTECTOMY N/A 10/18/2017   Procedure: LAPAROSCOPIC CHOLECYSTECTOMY;  Surgeon: Carolan Shiver, MD;  Location: [facility] ORS;  Service: General;  Laterality: N/A;   COLECTOMY  1998   partial, due to obstruction   COLONOSCOPY     lost 10 units of blood   COLONOSCOPY WITH PROPOFOL N/A 09/27/2019   Procedure: COLONOSCOPY WITH PROPOFOL;  Surgeon: [name], Boykin Nearing,  MD;  Location: [facility] ENDOSCOPY;  Service: Gastroenterology;  Laterality: N/A;   CYSTOSCOPY WITH INSERTION OF UROLIFT N/A 01/23/2020   Procedure: CYSTOSCOPY WITH INSERTION OF UROLIFT;  Surgeon: Riki Altes, MD;  Location: [facility] OR[facility];  Service: Urology;  Laterality: N/A;   ESOPHAGOGASTRODUODENOSCOPY N/A 10/14/2016   Procedure: ESOPHAGOGASTRODUODENOSCOPY (EGD);  Surgeon: Scot Jun, MD;  Location: [facility] ENDOSCOPY;  Service: Endoscopy;  Laterality: N/A;   ESOPHAGOGASTRODUODENOSCOPY (EGD) WITH PROPOFOL N/A 09/27/2019   Procedure: ESOPHAGOGASTRODUODENOSCOPY (EGD) WITH PROPOFOL;  Surgeon: [name], Boykin Nearing, MD;  Location: [facility] ENDOSCOPY;  Service: Gastroenterology;  Laterality: N/A;   EYE SURGERY     bilateral cataracts   HERNIA REPAIR  mid-90s   x2  inguinal   PROSTATE BIOPSY  2013   SURGERY SCROTAL / TESTICULAR     for fibrous growth   Patient Active Problem List   Diagnosis Date Noted   Diabetes mellitus type 2 with complications 03/23/2018   Chest pain with moderate risk for cardiac etiology 11/29/2017   Positive cardiac stress test 11/29/2017   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 11/08/2017   Esophagitis, reflux 11/08/2017   SOB (shortness of breath) on exertion 07/02/2017   BPH  with obstruction/lower urinary tract symptoms 11/21/2015   History of elevated PSA 11/21/2015   H/O gastric ulcer 09/05/2015   Disorder of eyeball 05/23/2015   H/O infectious disease 05/23/2015   Blood glucose elevated 05/23/2015   BP (high blood pressure) 05/23/2015   HLD (hyperlipidemia) 05/23/2015   Psoriasis 05/23/2015   Absolute anemia 01/02/2014   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   Elevated prostate specific antigen (PSA) 01/02/2014   Elevated blood uric acid level 01/02/2014   Enlarged prostate without lower urinary tract symptoms (luts) 01/02/2014   Bleeding gastrointestinal 04/15/2012    ONSET DATE: ~10 years  REFERRING DIAG:  R26.89 (ICD-10-CM) - Balance problem  R26.9 (ICD-10-CM) -  Gait abnormality    THERAPY DIAG:  Difficulty in walking, not elsewhere classified  Muscle weakness (generalized)  Rationale for Evaluation and Treatment: Rehabilitation   SUBJECTIVE:                                                                                                                                                                                             SUBJECTIVE STATEMENT:  Pt reports that he is not doing well. Has started CA treatment, and schedule has resulted in multiple side effects causing him to need to urinate  frequently as well as loose stool. Only mild discomrt reported in the L hip on this day.   Pt accompanied by: self  PERTINENT HISTORY:  Pt is 82 y.o. male who has experienced a decline in his overall functional mobility and stability since Covid and being unable to go back to the gym.  Pt notes that he has noticed it being increasingly difficult performing tasks without losing his balance and just feels generalized weakness.  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    PAIN: Are you having pain? No  PRECAUTIONS: None  WEIGHT BEARING RESTRICTIONS: No  FALLS: Has patient fallen in last 6 months? No  PLOF: Independent; pt does state he has difficulty tying shoes, but he has slip on shoes that assist with that.  PATIENT GOALS: Pt wants to be more mobile and be able to exercise.  OBJECTIVE:    DIAGNOSTIC FINDINGS:    EXAM 05/29/22: MRI HEAD WITHOUT CONTRAST   IMPRESSION: 1. No acute intracranial abnormality. 2. 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 3. Small chronic right cerebellar infarct.   COGNITION: Overall cognitive status: Within functional limits for tasks assessed  SENSATION: WFL   COORDINATION: WFL   POSTURE: rounded shoulders and forward  head     LOWER EXTREMITY MMT:     MMT Right Eval Left Eval  Hip flexion 5 4+  Hip abduction 4+ 4+  Hip adduction 4+ 4+  Knee flexion 5 4+  Knee extension 5 4+  Ankle dorsiflexion 5 5  (Blank rows = not tested)     TRANSFERS: Assistive device utilized: Single point cane  Sit to stand: Modified independence Stand to sit: Modified independence Chair to chair: Modified independence     GAIT: Gait pattern: step through pattern, decreased arm swing- Right, decreased arm swing- Left, decreased stride length, and lateral lean- Left Distance walked: 30' Assistive device utilized: Single point cane Level of assistance: Modified independence with AD Comments: Pt able to ambulate fair with some abnormalities in his gait pattern.   FUNCTIONAL TESTS:  5 times sit to stand: 11.81. 4/9 11.7 Timed up and go (TUG): 12.98 4/9 10.4 6 minute walk test: Not performed at evaluation: 3/28 805 10 meter walk test: 0.84 m/s  Berg Balance Scale: 47  FGA15/30 4/9 18/30    PATIENT SURVEYS:  FOTO 58. 59    TODAY'S TREATMENT: DATE: 11/19/22  Mild antalgia on the LLE with gait into rehab gym/ Sit>supine without assist.   Supine manual therapy. Longitudinal distraction to L hip 2 x 1 min  PROM hip flexion 2 x 1 min  Grade 2 lateral mobilization to the L hip 2 x 30 sec.  Figure 4 stretch 2 x 30 sec   Supine therex:  Bridge x 6  SAQ 4# ankle weight x 8  SLR AROM x 10  Hip abduction AROM  x 10 Heel slide x 10   Standing therex.  Calf raise/heel raise x 10  Standing hip flexor stretch 2 x 30 sec Pt reports need for urination. Transported to bathroom without assist, able to void per pt.  Standing march x 10 BLE  HS curl standing at rail x 10 BLE      PATIENT EDUCATION:  Education details: Pt educated on role of PT and services provided during current POC, along with prognosis and information about the clinic.  Pt educated throughout session about proper posture and technique with  exercises. Improved exercise technique, movement at target joints, use of target muscles after min to mod verbal, visual, tactile cues.   Person educated: Patient Education method: Explanation Education comprehension: verbalized understanding  HOME EXERCISE PROGRAM:  Access Code: [email or web address] URL: [email or web address] Date: 08/17/2022 Prepared by: Tomasa Hose  Exercises - Clamshell with Resistance  - 2 x daily - 7 x weekly - 2 sets - 10 reps - Sidelying Reverse Clamshell with Resistance  - 2 x daily - 7 x weekly - 2 sets - 10 reps - Prone Hip Extension with Resistance Loop  - 2 x daily - 7 x weekly - 2 sets - 10 reps - Supine Bridge with Resistance Band  - 2 x daily - 7 x weekly - 2 sets - 10 reps - Side Stepping with Resistance at Thighs  - 2 x daily - 7 x weekly - 2 sets - 10 reps  GOALS:  Goals reviewed with patient? Yes  SHORT TERM GOALS: Target date: 08/31/2022  Pt will be independent with HEP in order to demonstrate increased ability to perform tasks related to occupation/hobbies. Baseline: Pt given HEP 08/13/22 and noted above Goal status: IN PROGRESS   LONG TERM GOALS: Target date: 01/26/2023  1.  Patient (> 62 years old) will complete five times sit to stand test in < 10 seconds indicating an increased LE strength and improved balance. Baseline: 11.81 sec 09/16/22: Not attempted 4/9: 11.7sec no UE sUpport Goal status: INITIAL  2.  Patient will increase FOTO score to equal to or greater than 64 to demonstrate statistically significant improvement in mobility and quality of life.  Baseline: FOTO - 58 09/16/22: 56. 11/03/22: 59   Goal status: IN PROGRESS   3.  Patient will increase Berg Balance score by > 6 points to demonstrate decreased fall risk during functional activities. Baseline: 47 09/16/22: 54 Goal status: MET   4.  Patient will reduce timed up and go to <11 seconds to reduce fall risk and demonstrate improved transfer/gait ability. Baseline:  12.98 sec 09/16/22: 11.96 sec 3/28 11.45 4/9 10.4sec with SPC Goal status: MET  5.  Patient will increase 10 meter walk test to >1.53m/s as to improve gait speed for better community ambulation and to reduce fall risk. Baseline: 11.95 sec/0.84 m/s 09/16/22: 9.68 sec/1.033 m/s Goal status: MET  6.  Patient will increase six minute walk test distance to >1000 for progression to community ambulator and improve gait ability Baseline: 875 ft with [facility] 09/16/22: 917 ft with no AD 3/28: 881ft  Goal status: IN PROGRESS   7.  Patient will increase FGA score by > 4 points to demonstrate decreased fall risk during functional activities. Baseline: 15/30 4/9: 18/30 Goal status:In Progress.    ASSESSMENT:  CLINICAL IMPRESSION:   Patient presents with good motivation for completion of physical therapy activities.  Mild pain in the LLE limits session to AROM therex. Reports decreased pain in L hip following manual therapy. Pt will continue to benefit from skilled physical therapy intervention to address impairments, improve QOL, and attain therapy goals.         OBJECTIVE IMPAIRMENTS: Abnormal gait, decreased activity tolerance, decreased balance, decreased endurance, decreased mobility, difficulty walking, decreased strength, and decreased safety awareness.   ACTIVITY LIMITATIONS: carrying, lifting, bending, sitting, squatting, and locomotion level  PARTICIPATION LIMITATIONS: community activity and yard work  PERSONAL FACTORS: Age, Fitness, Past/current experiences, Time since onset of injury/illness/exacerbation, and 3+ comorbidities: DM2, Psoriasis, Prostate Cancer, GI complications, SOB on exertion  are also affecting patient's functional outcome.   REHAB POTENTIAL: Fair pt has significant influx of comorbidities that will likely impact their potential progress with PT.  CLINICAL DECISION MAKING: Stable/uncomplicated  EVALUATION COMPLEXITY: Moderate  PLAN:  PT FREQUENCY: 2x/week  PT  DURATION: 12 weeks  PLANNED INTERVENTIONS: Therapeutic exercises, Therapeutic activity, Neuromuscular re-education, Balance training, Gait training, Patient/Family education, Self Care, Joint mobilization, Joint manipulation, Stair training, Vestibular training, Canalith repositioning, Dry Needling, Electrical stimulation, Spinal manipulation, Spinal mobilization, Cryotherapy, Moist heat, and Manual therapy  PLAN FOR NEXT SESSION:   Continue to address strength and balance deficits and manual therapy for pain in the L hip as appropriate.    Golden Pop PT ,DPT Physical Therapist- [facility]    11:53 AM 11/19/22

## 2022-11-20 ENCOUNTER — Ambulatory Visit: Payer: Medicare Other

## 2022-11-20 ENCOUNTER — Other Ambulatory Visit: Payer: Self-pay

## 2022-11-20 ENCOUNTER — Ambulatory Visit
Admission: RE | Admit: 2022-11-20 | Discharge: 2022-11-20 | Disposition: A | Discharge status: Home / Self Care | Payer: Medicare Other | Source: Ambulatory Visit | Attending: Radiation Oncology | Admitting: Radiation Oncology

## 2022-11-20 DIAGNOSIS — C61 Malignant neoplasm of prostate: Secondary | ICD-10-CM | POA: Diagnosis not present

## 2022-11-20 LAB — RAD ONC ARIA SESSION SUMMARY
Course Elapsed Days: 8
Plan Fractions Treated to Date: 7
Plan Prescribed Dose Per Fraction: 2.6 Gy
Plan Total Fractions Prescribed: 28
Plan Total Prescribed Dose: 72.8 Gy
Reference Point Dosage Given to Date: 18.2 Gy
Reference Point Session Dosage Given: 2.6 Gy
Session Number: 7

## 2022-11-23 ENCOUNTER — Ambulatory Visit: Payer: Medicare Other | Admitting: Physical Therapy

## 2022-11-23 ENCOUNTER — Ambulatory Visit
Admission: RE | Admit: 2022-11-23 | Discharge: 2022-11-23 | Disposition: A | Discharge status: Home / Self Care | Payer: Medicare Other | Source: Ambulatory Visit | Attending: Radiation Oncology | Admitting: Radiation Oncology

## 2022-11-23 ENCOUNTER — Other Ambulatory Visit: Payer: Self-pay

## 2022-11-23 ENCOUNTER — Ambulatory Visit: Payer: Medicare Other

## 2022-11-23 DIAGNOSIS — C61 Malignant neoplasm of prostate: Secondary | ICD-10-CM | POA: Diagnosis not present

## 2022-11-23 DIAGNOSIS — R262 Difficulty in walking, not elsewhere classified: Secondary | ICD-10-CM | POA: Diagnosis not present

## 2022-11-23 DIAGNOSIS — M6281 Muscle weakness (generalized): Secondary | ICD-10-CM

## 2022-11-23 LAB — RAD ONC ARIA SESSION SUMMARY
Course Elapsed Days: 11
Plan Fractions Treated to Date: 8
Plan Prescribed Dose Per Fraction: 2.6 Gy
Plan Total Fractions Prescribed: 28
Plan Total Prescribed Dose: 72.8 Gy
Reference Point Dosage Given to Date: 20.8 Gy
Reference Point Session Dosage Given: 2.6 Gy
Session Number: 8

## 2022-11-23 NOTE — Therapy (Signed)
OUTPATIENT PHYSICAL THERAPY NEURO TREATMENT     Patient Name: Brian Callahan MRN: [id number] DOB:06/10/1941, 82 y.o., male Today's Date: 11/23/2022   PCP: Farrel Gobble, MD REFERRING PROVIDER: Theora Master, MD  END OF SESSION:   PT End of Session - 11/23/22 1446     Visit Number 26    Number of Visits 44    Date for PT Re-Evaluation 01/19/23    Authorization Type [employer]    Progress Note Due on Visit 30    PT Start Time 1435    PT Stop Time 1515    PT Time Calculation (min) 40 min    Equipment Utilized During Treatment Gait belt    Activity Tolerance Patient tolerated treatment well;No increased pain    Behavior During Therapy [facility] for tasks assessed/performed                    Past Medical History:  Diagnosis Date   Anemia    pt denies 01/18/20   BP (high blood pressure) 05/23/2015   BPH with obstruction/lower urinary tract symptoms    COPD (chronic obstructive pulmonary disease) (HCC)    Diabetes mellitus without complication (HCC)    diet controlled   Dyspnea    Encounter for long-term (current) use of other high-risk medications    GERD (gastroesophageal reflux disease)    gastritis   GI bleed    Gout    H/O hernia repair mid-90s   x2   History of elevated PSA    History of Rocky Mountain spotted fever    HLD (hyperlipidemia)    HTN (hypertension)    Hyperglycemia    Hyperuricemia    Incomplete bladder emptying    Macular degeneration    Obesity    Psoriasis    Psoriasis    Urinary frequency    Past Surgical History:  Procedure Laterality Date   APPENDECTOMY     BACK SURGERY     neck and spine 2007   CERVICAL SPINE SURGERY  2007   CHOLECYSTECTOMY N/A 10/18/2017   Procedure: LAPAROSCOPIC CHOLECYSTECTOMY;  Surgeon: Carolan Shiver, MD;  Location: [facility] ORS;  Service: General;  Laterality: N/A;   COLECTOMY  1998   partial, due to obstruction   COLONOSCOPY     lost 10 units of blood   COLONOSCOPY WITH PROPOFOL N/A 09/27/2019    Procedure: COLONOSCOPY WITH PROPOFOL;  Surgeon: [name], Boykin Nearing, MD;  Location: [facility] ENDOSCOPY;  Service: Gastroenterology;  Laterality: N/A;   CYSTOSCOPY WITH INSERTION OF UROLIFT N/A 01/23/2020   Procedure: CYSTOSCOPY WITH INSERTION OF UROLIFT;  Surgeon: Riki Altes, MD;  Location: [facility] ORS;  Service: Urology;  Laterality: N/A;   ESOPHAGOGASTRODUODENOSCOPY N/A 10/14/2016   Procedure: ESOPHAGOGASTRODUODENOSCOPY (EGD);  Surgeon: Scot Jun, MD;  Location: [facility] ENDOSCOPY;  Service: Endoscopy;  Laterality: N/A;   ESOPHAGOGASTRODUODENOSCOPY (EGD) WITH PROPOFOL N/A 09/27/2019   Procedure: ESOPHAGOGASTRODUODENOSCOPY (EGD) WITH PROPOFOL;  Surgeon: [name], Boykin Nearing, MD;  Location: [facility] ENDOSCOPY;  Service: Gastroenterology;  Laterality: N/A;   EYE SURGERY     bilateral cataracts   HERNIA REPAIR  mid-90s   x2  inguinal   PROSTATE BIOPSY  2013   SURGERY SCROTAL / TESTICULAR     for fibrous growth   Patient Active Problem List   Diagnosis Date Noted   Diabetes mellitus type 2 with complications (HCC) 03/23/2018   Chest pain with moderate risk for cardiac etiology 11/29/2017   Positive cardiac stress test 11/29/2017   Chronic gastritis without  bleeding 11/08/2017   Esophagitis, reflux 11/08/2017   SOB (shortness of breath) on exertion 07/02/2017   BPH with obstruction/lower urinary tract symptoms 11/21/2015   History of elevated PSA 11/21/2015   H/O gastric ulcer 09/05/2015   Disorder of eyeball 05/23/2015   H/O infectious disease 05/23/2015   Blood glucose elevated 05/23/2015   BP (high blood pressure) 05/23/2015   HLD (hyperlipidemia) 05/23/2015   Psoriasis 05/23/2015   Absolute anemia 01/02/2014   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   Elevated prostate specific antigen (PSA) 01/02/2014   Elevated blood uric acid level 01/02/2014   Enlarged prostate without lower urinary tract symptoms (luts) 01/02/2014   Bleeding gastrointestinal 04/15/2012    ONSET DATE: ~10  years  REFERRING DIAG:  R26.89 (ICD-10-CM) - Balance problem  R26.9 (ICD-10-CM) - Gait abnormality    THERAPY DIAG:  Difficulty in walking, not elsewhere classified  Muscle weakness (generalized)  Rationale for Evaluation and Treatment: Rehabilitation   SUBJECTIVE:                                                                                                                                                                                             SUBJECTIVE STATEMENT:  Pt reports that that he is doing well today. No excessive pain reported in hip or knee. States, that he was sore in the L hip yesterday, but has been doing better today. Some CA treatment s/s have slightly resolved.      Pt accompanied by: self  PERTINENT HISTORY:  Pt is 82 y.o. male who has experienced a decline in his overall functional mobility and stability since Covid and being unable to go back to the gym.  Pt notes that he has noticed it being increasingly difficult performing tasks without losing his balance and just feels generalized weakness.  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    PAIN: Are you having pain? No  PRECAUTIONS: None  WEIGHT BEARING RESTRICTIONS: No  FALLS: Has patient fallen in last 6 months? No  PLOF: Independent; pt does state he has difficulty tying shoes, but he has slip on shoes that assist with that.  PATIENT GOALS: Pt wants to be more mobile and be able to exercise.  OBJECTIVE:    DIAGNOSTIC FINDINGS:    EXAM 05/29/22: MRI HEAD WITHOUT CONTRAST   IMPRESSION: 1. No acute intracranial abnormality. 2. 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 3. Small chronic right cerebellar infarct.   COGNITION: Overall  cognitive status: Within functional limits for tasks assessed              SENSATION: WFL   COORDINATION: WFL   POSTURE: rounded shoulders and forward head     LOWER EXTREMITY MMT:     MMT Right Eval Left Eval  Hip flexion 5 4+  Hip abduction 4+ 4+  Hip adduction 4+ 4+  Knee flexion 5 4+  Knee extension 5 4+  Ankle dorsiflexion 5 5  (Blank rows = not tested)     TRANSFERS: Assistive device utilized: Single point cane  Sit to stand: Modified independence Stand to sit: Modified independence Chair to chair: Modified independence     GAIT: Gait pattern: step through pattern, decreased arm swing- Right, decreased arm swing- Left, decreased stride length, and lateral lean- Left Distance walked: 41' Assistive device utilized: Single point cane Level of assistance: Modified independence with AD Comments: Pt able to ambulate fair with some abnormalities in his gait pattern.   FUNCTIONAL TESTS:  5 times sit to stand: 11.81. 4/9 11.7 Timed up and go (TUG): 12.98 4/9 10.4 6 minute walk test: Not performed at evaluation: 3/28 805 10 meter walk test: 0.84 m/s  Berg Balance Scale: 47  FGA15/30 4/9 18/30    PATIENT SURVEYS:  FOTO 58. 59    TODAY'S TREATMENT: DATE: 11/23/22   Standing therex:  Hip abduction 2 x 10  Standing march 2 x 10  Calf raise 2 x 15 with forefoot on airex pad to increase ROM  Partial squat 2 x 10   NMR Static stance wide BOS 3 x 1 min  Static stance narrow BOS 2 x 30 sec  Eyes open/eyes closed 3 x 10 sec  Narrow base eyes closed 2 x 10sec  Mild discomfort in L hip and knee intermittently, to resolved with therapeutic rest breaks.       PATIENT EDUCATION:  Education details: Pt educated on role of PT and services provided during current POC, along with prognosis and information about the clinic.  Pt educated throughout session about proper posture and technique with exercises. Improved exercise technique, movement at target joints, use of target muscles after min to mod verbal, visual, tactile cues.   Person  educated: Patient Education method: Explanation Education comprehension: verbalized understanding  HOME EXERCISE PROGRAM:  Access Code: [email or web address] URL: [email or web address] Date: 08/17/2022 Prepared by: Tomasa Hose  Exercises - Clamshell with Resistance  - 2 x daily - 7 x weekly - 2 sets - 10 reps - Sidelying Reverse Clamshell with Resistance  - 2 x daily - 7 x weekly - 2 sets - 10 reps - Prone Hip Extension with Resistance Loop  - 2 x daily - 7 x weekly - 2 sets - 10 reps - Supine Bridge with Resistance Band  - 2 x daily - 7 x weekly - 2 sets - 10 reps - Side Stepping with Resistance at Thighs  - 2 x daily - 7 x weekly - 2 sets - 10 reps  GOALS:  Goals reviewed with patient? Yes  SHORT TERM GOALS: Target date: 08/31/2022  Pt will be independent with HEP in order to demonstrate increased ability to perform tasks related to occupation/hobbies. Baseline: Pt given HEP 08/13/22 and noted above Goal status: IN PROGRESS   LONG TERM GOALS: Target date: 01/26/2023    1.  Patient (> 3 years old) will complete five times sit to stand test in < 10 seconds indicating an increased LE strength and improved balance. Baseline:  11.81 sec 09/16/22: Not attempted 4/9: 11.7sec no UE sUpport Goal status: INITIAL  2.  Patient will increase FOTO score to equal to or greater than 64 to demonstrate statistically significant improvement in mobility and quality of life.  Baseline: FOTO - 58 09/16/22: 56. 11/03/22: 59   Goal status: IN PROGRESS   3.  Patient will increase Berg Balance score by > 6 points to demonstrate decreased fall risk during functional activities. Baseline: 47 09/16/22: 54 Goal status: MET   4.  Patient will reduce timed up and go to <11 seconds to reduce fall risk and demonstrate improved transfer/gait ability. Baseline: 12.98 sec 09/16/22: 11.96 sec 3/28 11.45 4/9 10.4sec with SPC Goal status: MET  5.  Patient will increase 10 meter walk test to >1.45m/s as to  improve gait speed for better community ambulation and to reduce fall risk. Baseline: 11.95 sec/0.84 m/s 09/16/22: 9.68 sec/1.033 m/s Goal status: MET  6.  Patient will increase six minute walk test distance to >1000 for progression to community ambulator and improve gait ability Baseline: 875 ft with [facility] 09/16/22: 917 ft with no AD 3/28: 823ft  Goal status: IN PROGRESS   7.  Patient will increase FGA score by > 4 points to demonstrate decreased fall risk during functional activities. Baseline: 15/30 4/9: 18/30 Goal status:In Progress.    ASSESSMENT:  CLINICAL IMPRESSION:   Patient presents with good motivation for completion of physical therapy activities.  PT treatment focused on BLE therex to improve hip and thigh strength, as well as improve standing balance. Noted to have mild L hip pain that resolved with therapeutic rest breaks, Pt will continue to benefit from skilled physical therapy intervention to address impairments, improve QOL, and attain therapy goals.         OBJECTIVE IMPAIRMENTS: Abnormal gait, decreased activity tolerance, decreased balance, decreased endurance, decreased mobility, difficulty walking, decreased strength, and decreased safety awareness.   ACTIVITY LIMITATIONS: carrying, lifting, bending, sitting, squatting, and locomotion level  PARTICIPATION LIMITATIONS: community activity and yard work  PERSONAL FACTORS: Age, Fitness, Past/current experiences, Time since onset of injury/illness/exacerbation, and 3+ comorbidities: DM2, Psoriasis, Prostate Cancer, GI complications, SOB on exertion  are also affecting patient's functional outcome.   REHAB POTENTIAL: Fair pt has significant influx of comorbidities that will likely impact their potential progress with PT.  CLINICAL DECISION MAKING: Stable/uncomplicated  EVALUATION COMPLEXITY: Moderate  PLAN:  PT FREQUENCY: 2x/week  PT DURATION: 12 weeks  PLANNED INTERVENTIONS: Therapeutic exercises,  Therapeutic activity, Neuromuscular re-education, Balance training, Gait training, Patient/Family education, Self Care, Joint mobilization, Joint manipulation, Stair training, Vestibular training, Canalith repositioning, Dry Needling, Electrical stimulation, Spinal manipulation, Spinal mobilization, Cryotherapy, Moist heat, and Manual therapy  PLAN FOR NEXT SESSION:   Continue to address strength and balance deficits and manual therapy for pain in the L hip as appropriate.    Golden Pop PT ,DPT Physical Therapist- [place]  [facility]    5:02 PM 11/23/22

## 2022-11-24 ENCOUNTER — Ambulatory Visit: Payer: Medicare Other

## 2022-11-24 ENCOUNTER — Ambulatory Visit
Admission: RE | Admit: 2022-11-24 | Discharge: 2022-11-24 | Disposition: A | Discharge status: Home / Self Care | Payer: Medicare Other | Source: Ambulatory Visit | Attending: Radiation Oncology | Admitting: Radiation Oncology

## 2022-11-24 ENCOUNTER — Other Ambulatory Visit: Payer: Self-pay

## 2022-11-24 DIAGNOSIS — C61 Malignant neoplasm of prostate: Secondary | ICD-10-CM | POA: Diagnosis not present

## 2022-11-24 LAB — RAD ONC ARIA SESSION SUMMARY
Course Elapsed Days: 12
Plan Fractions Treated to Date: 9
Plan Prescribed Dose Per Fraction: 2.6 Gy
Plan Total Fractions Prescribed: 28
Plan Total Prescribed Dose: 72.8 Gy
Reference Point Dosage Given to Date: 23.4 Gy
Reference Point Session Dosage Given: 2.6 Gy
Session Number: 9

## 2022-11-25 ENCOUNTER — Ambulatory Visit: Payer: Medicare Other

## 2022-11-25 ENCOUNTER — Other Ambulatory Visit: Payer: Self-pay

## 2022-11-25 ENCOUNTER — Ambulatory Visit
Admission: RE | Admit: 2022-11-25 | Discharge: 2022-11-25 | Disposition: A | Discharge status: Home / Self Care | Payer: Medicare Other | Source: Ambulatory Visit | Attending: Radiation Oncology | Admitting: Radiation Oncology

## 2022-11-25 DIAGNOSIS — J449 Chronic obstructive pulmonary disease, unspecified: Secondary | ICD-10-CM | POA: Diagnosis not present

## 2022-11-25 DIAGNOSIS — K219 Gastro-esophageal reflux disease without esophagitis: Secondary | ICD-10-CM | POA: Diagnosis not present

## 2022-11-25 DIAGNOSIS — E785 Hyperlipidemia, unspecified: Secondary | ICD-10-CM | POA: Insufficient documentation

## 2022-11-25 DIAGNOSIS — E669 Obesity, unspecified: Secondary | ICD-10-CM | POA: Insufficient documentation

## 2022-11-25 DIAGNOSIS — I10 Essential (primary) hypertension: Secondary | ICD-10-CM | POA: Insufficient documentation

## 2022-11-25 DIAGNOSIS — R351 Nocturia: Secondary | ICD-10-CM | POA: Diagnosis not present

## 2022-11-25 DIAGNOSIS — Z7984 Long term (current) use of oral hypoglycemic drugs: Secondary | ICD-10-CM | POA: Diagnosis not present

## 2022-11-25 DIAGNOSIS — Z79899 Other long term (current) drug therapy: Secondary | ICD-10-CM | POA: Diagnosis not present

## 2022-11-25 DIAGNOSIS — Z87891 Personal history of nicotine dependence: Secondary | ICD-10-CM | POA: Insufficient documentation

## 2022-11-25 DIAGNOSIS — C61 Malignant neoplasm of prostate: Secondary | ICD-10-CM | POA: Insufficient documentation

## 2022-11-25 LAB — RAD ONC ARIA SESSION SUMMARY
Course Elapsed Days: 13
Plan Fractions Treated to Date: 10
Plan Prescribed Dose Per Fraction: 2.6 Gy
Plan Total Fractions Prescribed: 28
Plan Total Prescribed Dose: 72.8 Gy
Reference Point Dosage Given to Date: 26 Gy
Reference Point Session Dosage Given: 2.6 Gy
Session Number: 10

## 2022-11-26 ENCOUNTER — Other Ambulatory Visit: Payer: Self-pay

## 2022-11-26 ENCOUNTER — Ambulatory Visit: Payer: Medicare Other

## 2022-11-26 ENCOUNTER — Ambulatory Visit
Admission: RE | Admit: 2022-11-26 | Discharge: 2022-11-26 | Disposition: A | Discharge status: Home / Self Care | Payer: Medicare Other | Source: Ambulatory Visit | Attending: Radiation Oncology | Admitting: Radiation Oncology

## 2022-11-26 DIAGNOSIS — C61 Malignant neoplasm of prostate: Secondary | ICD-10-CM | POA: Diagnosis not present

## 2022-11-26 LAB — RAD ONC ARIA SESSION SUMMARY
Course Elapsed Days: 14
Plan Fractions Treated to Date: 11
Plan Prescribed Dose Per Fraction: 2.6 Gy
Plan Total Fractions Prescribed: 28
Plan Total Prescribed Dose: 72.8 Gy
Reference Point Dosage Given to Date: 28.6 Gy
Reference Point Session Dosage Given: 2.6 Gy
Session Number: 11

## 2022-11-27 ENCOUNTER — Ambulatory Visit
Admission: RE | Admit: 2022-11-27 | Discharge: 2022-11-27 | Disposition: A | Discharge status: Home / Self Care | Payer: Medicare Other | Source: Ambulatory Visit | Attending: Radiation Oncology | Admitting: Radiation Oncology

## 2022-11-27 ENCOUNTER — Other Ambulatory Visit: Payer: Self-pay

## 2022-11-27 ENCOUNTER — Ambulatory Visit: Payer: Medicare Other

## 2022-11-27 DIAGNOSIS — C61 Malignant neoplasm of prostate: Secondary | ICD-10-CM | POA: Diagnosis not present

## 2022-11-27 LAB — RAD ONC ARIA SESSION SUMMARY
Course Elapsed Days: 15
Plan Fractions Treated to Date: 12
Plan Prescribed Dose Per Fraction: 2.6 Gy
Plan Total Fractions Prescribed: 28
Plan Total Prescribed Dose: 72.8 Gy
Reference Point Dosage Given to Date: 31.2 Gy
Reference Point Session Dosage Given: 2.6 Gy
Session Number: 12

## 2022-11-30 ENCOUNTER — Inpatient Hospital Stay: Payer: Medicare Other

## 2022-11-30 ENCOUNTER — Other Ambulatory Visit: Payer: Self-pay

## 2022-11-30 ENCOUNTER — Ambulatory Visit
Admission: RE | Admit: 2022-11-30 | Discharge: 2022-11-30 | Disposition: A | Discharge status: Home / Self Care | Payer: Medicare Other | Source: Ambulatory Visit | Attending: Radiation Oncology | Admitting: Radiation Oncology

## 2022-11-30 ENCOUNTER — Ambulatory Visit: Payer: Medicare Other

## 2022-11-30 DIAGNOSIS — C61 Malignant neoplasm of prostate: Secondary | ICD-10-CM

## 2022-11-30 DIAGNOSIS — R300 Dysuria: Secondary | ICD-10-CM | POA: Insufficient documentation

## 2022-11-30 DIAGNOSIS — R42 Dizziness and giddiness: Secondary | ICD-10-CM | POA: Insufficient documentation

## 2022-11-30 LAB — CBC (CANCER CENTER ONLY)
HCT: 36.3 % — ABNORMAL LOW (ref 39.0–52.0)
Hemoglobin: 11.9 g/dL — ABNORMAL LOW (ref 13.0–17.0)
MCH: 32.2 pg (ref 26.0–34.0)
MCHC: 32.8 g/dL (ref 30.0–36.0)
MCV: 98.1 fL (ref 80.0–100.0)
Platelet Count: 257 10*3/uL (ref 150–400)
RBC: 3.7 MIL/uL — ABNORMAL LOW (ref 4.22–5.81)
RDW: 14.5 % (ref 11.5–15.5)
WBC Count: 7.4 10*3/uL (ref 4.0–10.5)
nRBC: 0 % (ref 0.0–0.2)

## 2022-11-30 LAB — RAD ONC ARIA SESSION SUMMARY
Course Elapsed Days: 18
Plan Fractions Treated to Date: 13
Plan Prescribed Dose Per Fraction: 2.6 Gy
Plan Total Fractions Prescribed: 28
Plan Total Prescribed Dose: 72.8 Gy
Reference Point Dosage Given to Date: 33.8 Gy
Reference Point Session Dosage Given: 2.6 Gy
Session Number: 13

## 2022-12-01 ENCOUNTER — Ambulatory Visit: Payer: Medicare Other

## 2022-12-01 ENCOUNTER — Ambulatory Visit: Payer: Medicare Other | Admitting: Physical Therapy

## 2022-12-01 ENCOUNTER — Other Ambulatory Visit: Payer: Self-pay

## 2022-12-01 ENCOUNTER — Ambulatory Visit
Admission: RE | Admit: 2022-12-01 | Discharge: 2022-12-01 | Disposition: A | Discharge status: Home / Self Care | Payer: Medicare Other | Source: Ambulatory Visit | Attending: Radiation Oncology | Admitting: Radiation Oncology

## 2022-12-01 DIAGNOSIS — C61 Malignant neoplasm of prostate: Secondary | ICD-10-CM | POA: Diagnosis not present

## 2022-12-01 LAB — RAD ONC ARIA SESSION SUMMARY
Course Elapsed Days: 19
Plan Fractions Treated to Date: 14
Plan Prescribed Dose Per Fraction: 2.6 Gy
Plan Total Fractions Prescribed: 28
Plan Total Prescribed Dose: 72.8 Gy
Reference Point Dosage Given to Date: 36.4 Gy
Reference Point Session Dosage Given: 2.6 Gy
Session Number: 14

## 2022-12-01 NOTE — Therapy (Deleted)
OUTPATIENT PHYSICAL THERAPY NEURO TREATMENT     Patient Name: Brian Callahan MRN: [id number] DOB:20-Feb-1941, 82 y.o., male Today's Date: 12/01/2022   PCP: Farrel Gobble, MD REFERRING PROVIDER: Theora Master, MD  END OF SESSION:            Past Medical History:  Diagnosis Date   Anemia    pt denies 01/18/20   BP (high blood pressure) 05/23/2015   BPH with obstruction/lower urinary tract symptoms    COPD (chronic obstructive pulmonary disease) (HCC)    Diabetes mellitus without complication (HCC)    diet controlled   Dyspnea    Encounter for long-term (current) use of other high-risk medications    GERD (gastroesophageal reflux disease)    gastritis   GI bleed    Gout    H/O hernia repair mid-90s   x2   History of elevated PSA    History of Rocky Mountain spotted fever    HLD (hyperlipidemia)    HTN (hypertension)    Hyperglycemia    Hyperuricemia    Incomplete bladder emptying    Macular degeneration    Obesity    Psoriasis    Psoriasis    Urinary frequency    Past Surgical History:  Procedure Laterality Date   APPENDECTOMY     BACK SURGERY     neck and spine 2007   CERVICAL SPINE SURGERY  2007   CHOLECYSTECTOMY N/A 10/18/2017   Procedure: LAPAROSCOPIC CHOLECYSTECTOMY;  Surgeon: Carolan Shiver, MD;  Location: [facility] ORS;  Service: General;  Laterality: N/A;   COLECTOMY  1998   partial, due to obstruction   COLONOSCOPY     lost 10 units of blood   COLONOSCOPY WITH PROPOFOL N/A 09/27/2019   Procedure: COLONOSCOPY WITH PROPOFOL;  Surgeon: [name], Boykin Nearing, MD;  Location: [facility] ENDOSCOPY;  Service: Gastroenterology;  Laterality: N/A;   CYSTOSCOPY WITH INSERTION OF UROLIFT N/A 01/23/2020   Procedure: CYSTOSCOPY WITH INSERTION OF UROLIFT;  Surgeon: Riki Altes, MD;  Location: [facility] ORS;  Service: Urology;  Laterality: N/A;   ESOPHAGOGASTRODUODENOSCOPY N/A 10/14/2016   Procedure: ESOPHAGOGASTRODUODENOSCOPY (EGD);  Surgeon: Scot Jun, MD;   Location: [facility] ENDOSCOPY;  Service: Endoscopy;  Laterality: N/A;   ESOPHAGOGASTRODUODENOSCOPY (EGD) WITH PROPOFOL N/A 09/27/2019   Procedure: ESOPHAGOGASTRODUODENOSCOPY (EGD) WITH PROPOFOL;  Surgeon: [name], Boykin Nearing, MD;  Location: [facility] ENDOSCOPY;  Service: Gastroenterology;  Laterality: N/A;   EYE SURGERY     bilateral cataracts   HERNIA REPAIR  mid-90s   x2  inguinal   PROSTATE BIOPSY  2013   SURGERY SCROTAL / TESTICULAR     for fibrous growth   Patient Active Problem List   Diagnosis Date Noted   Diabetes mellitus type 2 with complications (HCC) 03/23/2018   Chest pain with moderate risk for cardiac etiology 11/29/2017   Positive cardiac stress test 11/29/2017   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 11/08/2017   Esophagitis, reflux 11/08/2017   SOB (shortness of breath) on exertion 07/02/2017   BPH with obstruction/lower urinary tract symptoms 11/21/2015   History of elevated PSA 11/21/2015   H/O gastric ulcer 09/05/2015   Disorder of eyeball 05/23/2015   H/O infectious disease 05/23/2015   Blood glucose elevated 05/23/2015   BP (high blood pressure) 05/23/2015   HLD (hyperlipidemia) 05/23/2015   Psoriasis 05/23/2015   Absolute anemia 01/02/2014   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   Elevated prostate specific antigen (PSA) 01/02/2014   Elevated blood uric acid level 01/02/2014   Enlarged prostate without lower urinary tract  symptoms (luts) 01/02/2014   Bleeding gastrointestinal 04/15/2012    ONSET DATE: ~10 years  REFERRING DIAG:  R26.89 (ICD-10-CM) - Balance problem  R26.9 (ICD-10-CM) - Gait abnormality    THERAPY DIAG:  Difficulty in walking, not elsewhere classified  Muscle weakness (generalized)  Rationale for Evaluation and Treatment: Rehabilitation   SUBJECTIVE:                                                                                                                                                                                              SUBJECTIVE STATEMENT:  Pt reports that that he is doing well today. No excessive pain reported in hip or knee. States, that he was sore in the L hip yesterday, but has been doing better today. Some CA treatment s/s have slightly resolved.      Pt accompanied by: self  PERTINENT HISTORY:  Pt is 82 y.o. male who has experienced a decline in his overall functional mobility and stability since Covid and being unable to go back to the gym.  Pt notes that he has noticed it being increasingly difficult performing tasks without losing his balance and just feels generalized weakness.  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    PAIN: Are you having pain? No  PRECAUTIONS: None  WEIGHT BEARING RESTRICTIONS: No  FALLS: Has patient fallen in last 6 months? No  PLOF: Independent; pt does state he has difficulty tying shoes, but he has slip on shoes that assist with that.  PATIENT GOALS: Pt wants to be more mobile and be able to exercise.  OBJECTIVE:    DIAGNOSTIC FINDINGS:    EXAM 05/29/22: MRI HEAD WITHOUT CONTRAST   IMPRESSION: 1. No acute intracranial abnormality. 2. 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 3. Small chronic right cerebellar infarct.   COGNITION: Overall cognitive status: Within functional limits for tasks assessed             SENSATION: WFL   COORDINATION: WFL   POSTURE: rounded shoulders and forward head     LOWER EXTREMITY MMT:     MMT Right Eval Left Eval  Hip flexion 5 4+  Hip abduction 4+ 4+  Hip adduction 4+ 4+  Knee flexion 5 4+  Knee extension 5 4+  Ankle dorsiflexion 5 5  (Blank rows = not tested)     TRANSFERS: Assistive device utilized: Single point cane  Sit to stand: Modified independence Stand to sit: Modified independence  Chair to chair: Modified  independence     GAIT: Gait pattern: step through pattern, decreased arm swing- Right, decreased arm swing- Left, decreased stride length, and lateral lean- Left Distance walked: 19' Assistive device utilized: Single point cane Level of assistance: Modified independence with AD Comments: Pt able to ambulate fair with some abnormalities in his gait pattern.   FUNCTIONAL TESTS:  5 times sit to stand: 11.81. 4/9 11.7 Timed up and go (TUG): 12.98 4/9 10.4 6 minute walk test: Not performed at evaluation: 3/28 805 10 meter walk test: 0.84 m/s  Berg Balance Scale: 47  FGA15/30 4/9 18/30    PATIENT SURVEYS:  FOTO 58. 59    TODAY'S TREATMENT: DATE: 12/01/22   Standing therex:  Hip abduction 2 x 10  Standing march 2 x 10  Calf raise 2 x 15 with forefoot on airex pad to increase ROM  Partial squat 2 x 10   NMR Static stance wide BOS 3 x 1 min  Static stance narrow BOS 2 x 30 sec  Eyes open/eyes closed 3 x 10 sec  Narrow base eyes closed 2 x 10sec  Mild discomfort in L hip and knee intermittently, to resolved with therapeutic rest breaks.       PATIENT EDUCATION:  Education details: Pt educated on role of PT and services provided during current POC, along with prognosis and information about the clinic.  Pt educated throughout session about proper posture and technique with exercises. Improved exercise technique, movement at target joints, use of target muscles after min to mod verbal, visual, tactile cues.   Person educated: Patient Education method: Explanation Education comprehension: verbalized understanding  HOME EXERCISE PROGRAM:  Access Code: [email or web address] URL: [email or web address] Date: 08/17/2022 Prepared by: Tomasa Hose  Exercises - Clamshell with Resistance  - 2 x daily - 7 x weekly - 2 sets - 10 reps - Sidelying Reverse Clamshell with Resistance  - 2 x daily - 7 x weekly - 2 sets - 10 reps - Prone Hip Extension with Resistance Loop  - 2 x daily  - 7 x weekly - 2 sets - 10 reps - Supine Bridge with Resistance Band  - 2 x daily - 7 x weekly - 2 sets - 10 reps - Side Stepping with Resistance at Thighs  - 2 x daily - 7 x weekly - 2 sets - 10 reps  GOALS:  Goals reviewed with patient? Yes  SHORT TERM GOALS: Target date: 08/31/2022  Pt will be independent with HEP in order to demonstrate increased ability to perform tasks related to occupation/hobbies. Baseline: Pt given HEP 08/13/22 and noted above Goal status: IN PROGRESS   LONG TERM GOALS: Target date: 01/26/2023    1.  Patient (> 67 years old) will complete five times sit to stand test in < 10 seconds indicating an increased LE strength and improved balance. Baseline: 11.81 sec 09/16/22: Not attempted 4/9: 11.7sec no UE sUpport Goal status: INITIAL  2.  Patient will increase FOTO score to equal to or greater than 64 to demonstrate statistically significant improvement in mobility and quality of life.  Baseline: FOTO - 58 09/16/22: 56. 11/03/22: 59   Goal status: IN PROGRESS   3.  Patient will increase Berg Balance score by > 6 points to demonstrate decreased fall risk during functional activities. Baseline: 47 09/16/22: 54 Goal status: MET   4.  Patient will reduce timed up and go to <11 seconds to reduce fall risk and demonstrate improved transfer/gait ability. Baseline: 12.98  sec 09/16/22: 11.96 sec 3/28 11.45 4/9 10.4sec with SPC Goal status: MET  5.  Patient will increase 10 meter walk test to >1.21m/s as to improve gait speed for better community ambulation and to reduce fall risk. Baseline: 11.95 sec/0.84 m/s 09/16/22: 9.68 sec/1.033 m/s Goal status: MET  6.  Patient will increase six minute walk test distance to >1000 for progression to community ambulator and improve gait ability Baseline: 875 ft with [facility] 09/16/22: 917 ft with no AD 3/28: 864ft  Goal status: IN PROGRESS   7.  Patient will increase FGA score by > 4 points to demonstrate decreased fall risk during  functional activities. Baseline: 15/30 4/9: 18/30 Goal status:In Progress.    ASSESSMENT:  CLINICAL IMPRESSION:   Patient presents with good motivation for completion of physical therapy activities.  PT treatment focused on BLE therex to improve hip and thigh strength, as well as improve standing balance. Noted to have mild L hip pain that resolved with therapeutic rest breaks, Pt will continue to benefit from skilled physical therapy intervention to address impairments, improve QOL, and attain therapy goals.         OBJECTIVE IMPAIRMENTS: Abnormal gait, decreased activity tolerance, decreased balance, decreased endurance, decreased mobility, difficulty walking, decreased strength, and decreased safety awareness.   ACTIVITY LIMITATIONS: carrying, lifting, bending, sitting, squatting, and locomotion level  PARTICIPATION LIMITATIONS: community activity and yard work  PERSONAL FACTORS: Age, Fitness, Past/current experiences, Time since onset of injury/illness/exacerbation, and 3+ comorbidities: DM2, Psoriasis, Prostate Cancer, GI complications, SOB on exertion  are also affecting patient's functional outcome.   REHAB POTENTIAL: Fair pt has significant influx of comorbidities that will likely impact their potential progress with PT.  CLINICAL DECISION MAKING: Stable/uncomplicated  EVALUATION COMPLEXITY: Moderate  PLAN:  PT FREQUENCY: 2x/week  PT DURATION: 12 weeks  PLANNED INTERVENTIONS: Therapeutic exercises, Therapeutic activity, Neuromuscular re-education, Balance training, Gait training, Patient/Family education, Self Care, Joint mobilization, Joint manipulation, Stair training, Vestibular training, Canalith repositioning, Dry Needling, Electrical stimulation, Spinal manipulation, Spinal mobilization, Cryotherapy, Moist heat, and Manual therapy  PLAN FOR NEXT SESSION:   Continue to address strength and balance deficits and manual therapy for pain in the L hip as appropriate.     Norman Herrlich PT ,DPT Physical Therapist- [place]  [facility]    2:21 PM 12/01/22

## 2022-12-02 ENCOUNTER — Ambulatory Visit
Admission: RE | Admit: 2022-12-02 | Discharge: 2022-12-02 | Disposition: A | Discharge status: Home / Self Care | Payer: Medicare Other | Source: Ambulatory Visit | Attending: Radiation Oncology | Admitting: Radiation Oncology

## 2022-12-02 ENCOUNTER — Other Ambulatory Visit: Payer: Self-pay

## 2022-12-02 ENCOUNTER — Encounter: Payer: Medicare Other | Admitting: Physical Therapy

## 2022-12-02 ENCOUNTER — Ambulatory Visit: Payer: Medicare Other

## 2022-12-02 DIAGNOSIS — C61 Malignant neoplasm of prostate: Secondary | ICD-10-CM | POA: Diagnosis not present

## 2022-12-02 LAB — RAD ONC ARIA SESSION SUMMARY
Course Elapsed Days: 20
Plan Fractions Treated to Date: 15
Plan Prescribed Dose Per Fraction: 2.6 Gy
Plan Total Fractions Prescribed: 28
Plan Total Prescribed Dose: 72.8 Gy
Reference Point Dosage Given to Date: 39 Gy
Reference Point Session Dosage Given: 2.6 Gy
Session Number: 15

## 2022-12-03 ENCOUNTER — Other Ambulatory Visit: Payer: Self-pay

## 2022-12-03 ENCOUNTER — Ambulatory Visit: Payer: Medicare Other | Admitting: Physical Therapy

## 2022-12-03 ENCOUNTER — Ambulatory Visit: Payer: Medicare Other

## 2022-12-03 ENCOUNTER — Ambulatory Visit
Admission: RE | Admit: 2022-12-03 | Discharge: 2022-12-03 | Disposition: A | Discharge status: Home / Self Care | Payer: Medicare Other | Source: Ambulatory Visit | Attending: Radiation Oncology | Admitting: Radiation Oncology

## 2022-12-03 DIAGNOSIS — C61 Malignant neoplasm of prostate: Secondary | ICD-10-CM | POA: Diagnosis not present

## 2022-12-03 LAB — RAD ONC ARIA SESSION SUMMARY
Course Elapsed Days: 21
Plan Fractions Treated to Date: 16
Plan Prescribed Dose Per Fraction: 2.6 Gy
Plan Total Fractions Prescribed: 28
Plan Total Prescribed Dose: 72.8 Gy
Reference Point Dosage Given to Date: 41.6 Gy
Reference Point Session Dosage Given: 2.6 Gy
Session Number: 16

## 2022-12-04 ENCOUNTER — Ambulatory Visit
Admission: RE | Admit: 2022-12-04 | Discharge: 2022-12-04 | Disposition: A | Discharge status: Home / Self Care | Payer: Medicare Other | Source: Ambulatory Visit | Attending: Radiation Oncology | Admitting: Radiation Oncology

## 2022-12-04 ENCOUNTER — Other Ambulatory Visit: Payer: Self-pay

## 2022-12-04 ENCOUNTER — Ambulatory Visit: Payer: Medicare Other

## 2022-12-04 DIAGNOSIS — C61 Malignant neoplasm of prostate: Secondary | ICD-10-CM | POA: Diagnosis not present

## 2022-12-04 LAB — RAD ONC ARIA SESSION SUMMARY
Course Elapsed Days: 22
Plan Fractions Treated to Date: 17
Plan Prescribed Dose Per Fraction: 2.6 Gy
Plan Total Fractions Prescribed: 28
Plan Total Prescribed Dose: 72.8 Gy
Reference Point Dosage Given to Date: 44.2 Gy
Reference Point Session Dosage Given: 2.6 Gy
Session Number: 17

## 2022-12-07 ENCOUNTER — Ambulatory Visit: Payer: Medicare Other | Attending: Neurology

## 2022-12-07 ENCOUNTER — Other Ambulatory Visit: Payer: Self-pay

## 2022-12-07 ENCOUNTER — Ambulatory Visit
Admission: RE | Admit: 2022-12-07 | Discharge: 2022-12-07 | Disposition: A | Discharge status: Home / Self Care | Payer: Medicare Other | Source: Ambulatory Visit | Attending: Radiation Oncology | Admitting: Radiation Oncology

## 2022-12-07 ENCOUNTER — Ambulatory Visit: Payer: Medicare Other

## 2022-12-07 DIAGNOSIS — M6281 Muscle weakness (generalized): Secondary | ICD-10-CM | POA: Diagnosis present

## 2022-12-07 DIAGNOSIS — C61 Malignant neoplasm of prostate: Secondary | ICD-10-CM | POA: Diagnosis not present

## 2022-12-07 DIAGNOSIS — R262 Difficulty in walking, not elsewhere classified: Secondary | ICD-10-CM | POA: Diagnosis present

## 2022-12-07 LAB — RAD ONC ARIA SESSION SUMMARY
Course Elapsed Days: 25
Plan Fractions Treated to Date: 18
Plan Prescribed Dose Per Fraction: 2.6 Gy
Plan Total Fractions Prescribed: 28
Plan Total Prescribed Dose: 72.8 Gy
Reference Point Dosage Given to Date: 46.8 Gy
Reference Point Session Dosage Given: 2.6 Gy
Session Number: 18

## 2022-12-07 NOTE — Therapy (Signed)
OUTPATIENT PHYSICAL THERAPY NEURO TREATMENT     Patient Name: TORRE CARULLI MRN: [id number] DOB:03-14-1941, 82 y.o., male Today's Date: 12/07/2022   PCP: Farrel Gobble, MD REFERRING PROVIDER: Theora Master, MD  END OF SESSION:   PT End of Session - 12/07/22 1138     Visit Number 27    Number of Visits 44    Date for PT Re-Evaluation 01/19/23    Authorization Type [employer] Medicare    Progress Note Due on Visit 30    PT Start Time 1138    PT Stop Time 1230    PT Time Calculation (min) 52 min    Equipment Utilized During Treatment Gait belt    Activity Tolerance Patient tolerated treatment well;No increased pain    Behavior During Therapy [facility] for tasks assessed/performed                     Past Medical History:  Diagnosis Date   Anemia    pt denies 01/18/20   BP (high blood pressure) 05/23/2015   BPH with obstruction/lower urinary tract symptoms    COPD (chronic obstructive pulmonary disease) (HCC)    Diabetes mellitus without complication (HCC)    diet controlled   Dyspnea    Encounter for long-term (current) use of other high-risk medications    GERD (gastroesophageal reflux disease)    gastritis   GI bleed    Gout    H/O hernia repair mid-90s   x2   History of elevated PSA    History of Rocky Mountain spotted fever    HLD (hyperlipidemia)    HTN (hypertension)    Hyperglycemia    Hyperuricemia    Incomplete bladder emptying    Macular degeneration    Obesity    Psoriasis    Psoriasis    Urinary frequency    Past Surgical History:  Procedure Laterality Date   APPENDECTOMY     BACK SURGERY     neck and spine 2007   CERVICAL SPINE SURGERY  2007   CHOLECYSTECTOMY N/A 10/18/2017   Procedure: LAPAROSCOPIC CHOLECYSTECTOMY;  Surgeon: Carolan Shiver, MD;  Location: [facility] ORS;  Service: General;  Laterality: N/A;   COLECTOMY  1998   partial, due to obstruction   COLONOSCOPY     lost 10 units of blood   COLONOSCOPY WITH PROPOFOL N/A 09/27/2019    Procedure: COLONOSCOPY WITH PROPOFOL;  Surgeon: [name], Boykin Nearing, MD;  Location: [facility] ENDOSCOPY;  Service: Gastroenterology;  Laterality: N/A;   CYSTOSCOPY WITH INSERTION OF UROLIFT N/A 01/23/2020   Procedure: CYSTOSCOPY WITH INSERTION OF UROLIFT;  Surgeon: Riki Altes, MD;  Location: [facility] ORS;  Service: Urology;  Laterality: N/A;   ESOPHAGOGASTRODUODENOSCOPY N/A 10/14/2016   Procedure: ESOPHAGOGASTRODUODENOSCOPY (EGD);  Surgeon: Scot Jun, MD;  Location: [facility] ENDOSCOPY;  Service: Endoscopy;  Laterality: N/A;   ESOPHAGOGASTRODUODENOSCOPY (EGD) WITH PROPOFOL N/A 09/27/2019   Procedure: ESOPHAGOGASTRODUODENOSCOPY (EGD) WITH PROPOFOL;  Surgeon: [name], Boykin Nearing, MD;  Location: [facility] ENDOSCOPY;  Service: Gastroenterology;  Laterality: N/A;   EYE SURGERY     bilateral cataracts   HERNIA REPAIR  mid-90s   x2  inguinal   PROSTATE BIOPSY  2013   SURGERY SCROTAL / TESTICULAR     for fibrous growth   Patient Active Problem List   Diagnosis Date Noted   Diabetes mellitus type 2 with complications (HCC) 03/23/2018   Chest pain with moderate risk for cardiac etiology 11/29/2017   Positive cardiac stress test 11/29/2017   Chronic gastritis  without bleeding 11/08/2017   Esophagitis, reflux 11/08/2017   SOB (shortness of breath) on exertion 07/02/2017   BPH with obstruction/lower urinary tract symptoms 11/21/2015   History of elevated PSA 11/21/2015   H/O gastric ulcer 09/05/2015   Disorder of eyeball 05/23/2015   H/O infectious disease 05/23/2015   Blood glucose elevated 05/23/2015   BP (high blood pressure) 05/23/2015   HLD (hyperlipidemia) 05/23/2015   Psoriasis 05/23/2015   Absolute anemia 01/02/2014   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   Elevated prostate specific antigen (PSA) 01/02/2014   Elevated blood uric acid level 01/02/2014   Enlarged prostate without lower urinary tract symptoms (luts) 01/02/2014   Bleeding gastrointestinal 04/15/2012    ONSET DATE: ~10  years  REFERRING DIAG:  R26.89 (ICD-10-CM) - Balance problem  R26.9 (ICD-10-CM) - Gait abnormality    THERAPY DIAG:  Difficulty in walking, not elsewhere classified  Muscle weakness (generalized)  Rationale for Evaluation and Treatment: Rehabilitation   SUBJECTIVE:                                                                                                                                                                                             SUBJECTIVE STATEMENT:  Patient reports doing well this week with no pain today.      Pt accompanied by: self  PERTINENT HISTORY:  Pt is 82 y.o. male who has experienced a decline in his overall functional mobility and stability since Covid and being unable to go back to the gym.  Pt notes that he has noticed it being increasingly difficult performing tasks without losing his balance and just feels generalized weakness.  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    PAIN: Are you having pain? No  PRECAUTIONS: None  WEIGHT BEARING RESTRICTIONS: No  FALLS: Has patient fallen in last 6 months? No  PLOF: Independent; pt does state he has difficulty tying shoes, but he has slip on shoes that assist with that.  PATIENT GOALS: Pt wants to be more mobile and be able to exercise.  OBJECTIVE:    DIAGNOSTIC FINDINGS:    EXAM 05/29/22: MRI HEAD WITHOUT CONTRAST   IMPRESSION: 1. No acute intracranial abnormality. 2. 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 3. Small chronic right cerebellar infarct.   COGNITION: Overall cognitive status: Within functional limits for tasks assessed             SENSATION: WFL   COORDINATION: WFL   POSTURE:  rounded shoulders and forward head     LOWER EXTREMITY MMT:     MMT Right Eval Left Eval  Hip flexion 5 4+  Hip  abduction 4+ 4+  Hip adduction 4+ 4+  Knee flexion 5 4+  Knee extension 5 4+  Ankle dorsiflexion 5 5  (Blank rows = not tested)     TRANSFERS: Assistive device utilized: Single point cane  Sit to stand: Modified independence Stand to sit: Modified independence Chair to chair: Modified independence     GAIT: Gait pattern: step through pattern, decreased arm swing- Right, decreased arm swing- Left, decreased stride length, and lateral lean- Left Distance walked: 58' Assistive device utilized: Single point cane Level of assistance: Modified independence with AD Comments: Pt able to ambulate fair with some abnormalities in his gait pattern.   FUNCTIONAL TESTS:  5 times sit to stand: 11.81. 4/9 11.7 Timed up and go (TUG): 12.98 4/9 10.4 6 minute walk test: Not performed at evaluation: 3/28 805 10 meter walk test: 0.84 m/s  Berg Balance Scale: 47  FGA15/30 4/9 18/30    PATIENT SURVEYS:  FOTO 58. 59    TODAY'S TREATMENT: DATE: 12/07/22   THEREX:   Scap retraction with GTB 2 sets of 10  Seated LAQ with 3# AW- 2 sets of 10 reps Ambulation with 3# AW- and SPC- 175 feet- x 2 trials= O2 sat=95% and HR= 102 bpm  Standing Shoulder horizontal Abd with GTB- 2 sets of 10 reps Step tap with 3# AW onto 1st step without UE support- 2 sets of 12 reps Ambulation with 3# AW- and SPC- 175 feet- x 2 trials  Shoulder flex 3# BUE  2 sets x 10 reps Sit to stand with arms crossed 2 sets x 10 reps        PATIENT EDUCATION:  Education details: Pt educated on role of PT and services provided during current POC, along with prognosis and information about the clinic.  Pt educated throughout session about proper posture and technique with exercises. Improved exercise technique, movement at target joints, use of target muscles after min to mod verbal, visual, tactile cues.   Person educated: Patient Education method: Explanation Education comprehension: verbalized understanding  HOME  EXERCISE PROGRAM:  Access Code: [email or web address] URL: [email or web address] Date: 08/17/2022 Prepared by: Tomasa Hose  Exercises - Clamshell with Resistance  - 2 x daily - 7 x weekly - 2 sets - 10 reps - Sidelying Reverse Clamshell with Resistance  - 2 x daily - 7 x weekly - 2 sets - 10 reps - Prone Hip Extension with Resistance Loop  - 2 x daily - 7 x weekly - 2 sets - 10 reps - Supine Bridge with Resistance Band  - 2 x daily - 7 x weekly - 2 sets - 10 reps - Side Stepping with Resistance at Thighs  - 2 x daily - 7 x weekly - 2 sets - 10 reps  GOALS:  Goals reviewed with patient? Yes  SHORT TERM GOALS: Target date: 08/31/2022  Pt will be independent with HEP in order to demonstrate increased ability to perform tasks related to occupation/hobbies. Baseline: Pt given HEP 08/13/22 and noted above Goal status: IN PROGRESS   LONG TERM GOALS: Target date: 01/26/2023    1.  Patient (> 41 years old) will complete five times sit to stand test in < 10 seconds indicating an increased LE strength and improved balance. Baseline: 11.81 sec 09/16/22: Not attempted 4/9: 11.7sec no UE sUpport Goal status: INITIAL  2.  Patient will increase FOTO score to equal to or greater than 64 to demonstrate statistically significant improvement in mobility and quality of life.  Baseline: FOTO - 58 09/16/22: 56. 11/03/22: 59   Goal status: IN PROGRESS   3.  Patient will increase Berg Balance score by > 6 points to demonstrate decreased fall risk during functional activities. Baseline: 47 09/16/22: 54 Goal status: MET   4.  Patient will reduce timed up and go to <11 seconds to reduce fall risk and demonstrate improved transfer/gait ability. Baseline: 12.98 sec 09/16/22: 11.96 sec 3/28 11.45 4/9 10.4sec with SPC Goal status: MET  5.  Patient will increase 10 meter walk test to >1.85m/s as to improve gait speed for better community ambulation and to reduce fall risk. Baseline: 11.95 sec/0.84  m/s 09/16/22: 9.68 sec/1.033 m/s Goal status: MET  6.  Patient will increase six minute walk test distance to >1000 for progression to community ambulator and improve gait ability Baseline: 875 ft with [facility] 09/16/22: 917 ft with no AD 3/28: 827ft  Goal status: IN PROGRESS   7.  Patient will increase FGA score by > 4 points to demonstrate decreased fall risk during functional activities. Baseline: 15/30 4/9: 18/30 Goal status:In Progress.    ASSESSMENT:  CLINICAL IMPRESSION:   Treatment continued per plan of care - focusing on posture/LE strengthening along with endurance with walking (resistive today). Patient performed well overall today- able to complete activities with circuit style workout plan.  Pt will continue to benefit from skilled physical therapy intervention to address impairments, improve QOL, and attain therapy goals.         OBJECTIVE IMPAIRMENTS: Abnormal gait, decreased activity tolerance, decreased balance, decreased endurance, decreased mobility, difficulty walking, decreased strength, and decreased safety awareness.   ACTIVITY LIMITATIONS: carrying, lifting, bending, sitting, squatting, and locomotion level  PARTICIPATION LIMITATIONS: community activity and yard work  PERSONAL FACTORS: Age, Fitness, Past/current experiences, Time since onset of injury/illness/exacerbation, and 3+ comorbidities: DM2, Psoriasis, Prostate Cancer, GI complications, SOB on exertion  are also affecting patient's functional outcome.   REHAB POTENTIAL: Fair pt has significant influx of comorbidities that will likely impact their potential progress with PT.  CLINICAL DECISION MAKING: Stable/uncomplicated  EVALUATION COMPLEXITY: Moderate  PLAN:  PT FREQUENCY: 2x/week  PT DURATION: 12 weeks  PLANNED INTERVENTIONS: Therapeutic exercises, Therapeutic activity, Neuromuscular re-education, Balance training, Gait training, Patient/Family education, Self Care, Joint mobilization, Joint  manipulation, Stair training, Vestibular training, Canalith repositioning, Dry Needling, Electrical stimulation, Spinal manipulation, Spinal mobilization, Cryotherapy, Moist heat, and Manual therapy  PLAN FOR NEXT SESSION:   Continue to address strength and balance deficits and manual therapy for pain in the L hip as appropriate.    Lenda Kelp PT  Physical Therapist- [facility]    12:32 PM 12/07/22

## 2022-12-08 ENCOUNTER — Ambulatory Visit: Payer: Medicare Other

## 2022-12-08 ENCOUNTER — Other Ambulatory Visit: Payer: Self-pay

## 2022-12-08 ENCOUNTER — Ambulatory Visit
Admission: RE | Admit: 2022-12-08 | Discharge: 2022-12-08 | Disposition: A | Discharge status: Home / Self Care | Payer: Medicare Other | Source: Ambulatory Visit | Attending: Radiation Oncology | Admitting: Radiation Oncology

## 2022-12-08 DIAGNOSIS — C61 Malignant neoplasm of prostate: Secondary | ICD-10-CM | POA: Diagnosis not present

## 2022-12-08 LAB — RAD ONC ARIA SESSION SUMMARY
Course Elapsed Days: 26
Plan Fractions Treated to Date: 19
Plan Prescribed Dose Per Fraction: 2.6 Gy
Plan Total Fractions Prescribed: 28
Plan Total Prescribed Dose: 72.8 Gy
Reference Point Dosage Given to Date: 49.4 Gy
Reference Point Session Dosage Given: 2.6 Gy
Session Number: 19

## 2022-12-09 ENCOUNTER — Ambulatory Visit: Payer: Medicare Other

## 2022-12-09 ENCOUNTER — Other Ambulatory Visit: Payer: Self-pay

## 2022-12-09 ENCOUNTER — Ambulatory Visit
Admission: RE | Admit: 2022-12-09 | Discharge: 2022-12-09 | Disposition: A | Discharge status: Home / Self Care | Payer: Medicare Other | Source: Ambulatory Visit | Attending: Radiation Oncology | Admitting: Radiation Oncology

## 2022-12-09 ENCOUNTER — Encounter: Payer: Self-pay | Admitting: Physical Therapy

## 2022-12-09 ENCOUNTER — Ambulatory Visit: Payer: Medicare Other | Admitting: Physical Therapy

## 2022-12-09 DIAGNOSIS — R262 Difficulty in walking, not elsewhere classified: Secondary | ICD-10-CM | POA: Diagnosis not present

## 2022-12-09 DIAGNOSIS — M6281 Muscle weakness (generalized): Secondary | ICD-10-CM

## 2022-12-09 DIAGNOSIS — C61 Malignant neoplasm of prostate: Secondary | ICD-10-CM | POA: Diagnosis not present

## 2022-12-09 LAB — RAD ONC ARIA SESSION SUMMARY
Course Elapsed Days: 27
Plan Fractions Treated to Date: 20
Plan Prescribed Dose Per Fraction: 2.6 Gy
Plan Total Fractions Prescribed: 28
Plan Total Prescribed Dose: 72.8 Gy
Reference Point Dosage Given to Date: 52 Gy
Reference Point Session Dosage Given: 2.6 Gy
Session Number: 20

## 2022-12-09 NOTE — Therapy (Signed)
OUTPATIENT PHYSICAL THERAPY NEURO TREATMENT     Patient Name: Brian Callahan MRN: [id number] DOB:October 09, 1940, 82 y.o., male Today's Date: 12/09/2022   PCP: Farrel Gobble, MD REFERRING PROVIDER: Theora Master, MD  END OF SESSION:   PT End of Session - 12/09/22 1146     Visit Number 28    Number of Visits 44    Date for PT Re-Evaluation 01/19/23    Authorization Type [employer] Medicare    Progress Note Due on Visit 30    PT Start Time 1145    PT Stop Time 1227    PT Time Calculation (min) 42 min    Equipment Utilized During Treatment Gait belt    Activity Tolerance Patient tolerated treatment well;No increased pain    Behavior During Therapy [facility] for tasks assessed/performed                      Past Medical History:  Diagnosis Date   Anemia    pt denies 01/18/20   BP (high blood pressure) 05/23/2015   BPH with obstruction/lower urinary tract symptoms    COPD (chronic obstructive pulmonary disease) (HCC)    Diabetes mellitus without complication (HCC)    diet controlled   Dyspnea    Encounter for long-term (current) use of other high-risk medications    GERD (gastroesophageal reflux disease)    gastritis   GI bleed    Gout    H/O hernia repair mid-90s   x2   History of elevated PSA    History of Rocky Mountain spotted fever    HLD (hyperlipidemia)    HTN (hypertension)    Hyperglycemia    Hyperuricemia    Incomplete bladder emptying    Macular degeneration    Obesity    Psoriasis    Psoriasis    Urinary frequency    Past Surgical History:  Procedure Laterality Date   APPENDECTOMY     BACK SURGERY     neck and spine 2007   CERVICAL SPINE SURGERY  2007   CHOLECYSTECTOMY N/A 10/18/2017   Procedure: LAPAROSCOPIC CHOLECYSTECTOMY;  Surgeon: Carolan Shiver, MD;  Location: [facility] ORS;  Service: General;  Laterality: N/A;   COLECTOMY  1998   partial, due to obstruction   COLONOSCOPY     lost 10 units of blood   COLONOSCOPY WITH PROPOFOL N/A  09/27/2019   Procedure: COLONOSCOPY WITH PROPOFOL;  Surgeon: [name], Boykin Nearing, MD;  Location: [facility] ENDOSCOPY;  Service: Gastroenterology;  Laterality: N/A;   CYSTOSCOPY WITH INSERTION OF UROLIFT N/A 01/23/2020   Procedure: CYSTOSCOPY WITH INSERTION OF UROLIFT;  Surgeon: Riki Altes, MD;  Location: [facility] ORS;  Service: Urology;  Laterality: N/A;   ESOPHAGOGASTRODUODENOSCOPY N/A 10/14/2016   Procedure: ESOPHAGOGASTRODUODENOSCOPY (EGD);  Surgeon: Scot Jun, MD;  Location: [facility] ENDOSCOPY;  Service: Endoscopy;  Laterality: N/A;   ESOPHAGOGASTRODUODENOSCOPY (EGD) WITH PROPOFOL N/A 09/27/2019   Procedure: ESOPHAGOGASTRODUODENOSCOPY (EGD) WITH PROPOFOL;  Surgeon: [name], Boykin Nearing, MD;  Location: [facility] ENDOSCOPY;  Service: Gastroenterology;  Laterality: N/A;   EYE SURGERY     bilateral cataracts   HERNIA REPAIR  mid-90s   x2  inguinal   PROSTATE BIOPSY  2013   SURGERY SCROTAL / TESTICULAR     for fibrous growth   Patient Active Problem List   Diagnosis Date Noted   Diabetes mellitus type 2 with complications (HCC) 03/23/2018   Chest pain with moderate risk for cardiac etiology 11/29/2017   Positive cardiac stress test 11/29/2017   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 11/08/2017   Esophagitis, reflux 11/08/2017   SOB (shortness of breath) on exertion 07/02/2017   BPH with obstruction/lower urinary tract symptoms 11/21/2015   History of elevated PSA 11/21/2015   H/O gastric ulcer 09/05/2015   Disorder of eyeball 05/23/2015   H/O infectious disease 05/23/2015   Blood glucose elevated 05/23/2015   BP (high blood pressure) 05/23/2015   HLD (hyperlipidemia) 05/23/2015   Psoriasis 05/23/2015   Absolute anemia 01/02/2014   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   Elevated prostate specific antigen (PSA) 01/02/2014   Elevated blood uric acid level 01/02/2014   Enlarged prostate without lower urinary tract symptoms (luts) 01/02/2014   Bleeding gastrointestinal 04/15/2012    ONSET DATE: ~10  years  REFERRING DIAG:  R26.89 (ICD-10-CM) - Balance problem  R26.9 (ICD-10-CM) - Gait abnormality    THERAPY DIAG:  Difficulty in walking, not elsewhere classified  Muscle weakness (generalized)  Rationale for Evaluation and Treatment: Rehabilitation   SUBJECTIVE:                                                                                                                                                                                             SUBJECTIVE STATEMENT:  Patient reports doing well this week with no pain today. He got a lot of sleep the night before but he is still tired today. No hip pain to note.      Pt accompanied by: self  PERTINENT HISTORY:  Pt is 82 y.o. male who has experienced a decline in his overall functional mobility and stability since Covid and being unable to go back to the gym.  Pt notes that he has noticed it being increasingly difficult performing tasks without losing his balance and just feels generalized weakness.  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    PAIN: Are you having pain? No  PRECAUTIONS: None  WEIGHT BEARING RESTRICTIONS: No  FALLS: Has patient fallen in last 6 months? No  PLOF: Independent; pt does state he has difficulty tying shoes, but he has slip on shoes that assist with that.  PATIENT GOALS: Pt wants to be more mobile and be able to exercise.  OBJECTIVE:    DIAGNOSTIC FINDINGS:    EXAM 05/29/22: MRI HEAD WITHOUT CONTRAST   IMPRESSION: 1. No acute intracranial abnormality. 2. 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 3. Small chronic right cerebellar infarct.   COGNITION: Overall cognitive status: Within functional limits for tasks assessed  SENSATION: WFL   COORDINATION: WFL   POSTURE: rounded shoulders and forward  head     LOWER EXTREMITY MMT:     MMT Right Eval Left Eval  Hip flexion 5 4+  Hip abduction 4+ 4+  Hip adduction 4+ 4+  Knee flexion 5 4+  Knee extension 5 4+  Ankle dorsiflexion 5 5  (Blank rows = not tested)     TRANSFERS: Assistive device utilized: Single point cane  Sit to stand: Modified independence Stand to sit: Modified independence Chair to chair: Modified independence     GAIT: Gait pattern: step through pattern, decreased arm swing- Right, decreased arm swing- Left, decreased stride length, and lateral lean- Left Distance walked: 66' Assistive device utilized: Single point cane Level of assistance: Modified independence with AD Comments: Pt able to ambulate fair with some abnormalities in his gait pattern.   FUNCTIONAL TESTS:  5 times sit to stand: 11.81. 4/9 11.7 Timed up and go (TUG): 12.98 4/9 10.4 6 minute walk test: Not performed at evaluation: 3/28 805 10 meter walk test: 0.84 m/s  Berg Balance Scale: 47  FGA15/30 4/9 18/30    PATIENT SURVEYS:  FOTO 58. 59    TODAY'S TREATMENT: DATE: 12/09/22   THEREX:   Scap retraction with GTB 2 sets of 10  Seated March with 2.5# AW- 2 sets of 10 reps Ambulation with 2.5# AW- and SPC- 175 feet- x 2 trials  Standing Shoulder horizontal Abd with GTB- 2 sets of 10 reps LAQ with 2.5#- 2 sets of 12 reps Ambulation with 2.5# AW- and SPC- 175 feet- x 2 trials  Standing step taps to 6 inch step with 2.5# AW 2 x 10 ea LE        PATIENT EDUCATION:  Education details: Pt educated on role of PT and services provided during current POC, along with prognosis and information about the clinic.  Pt educated throughout session about proper posture and technique with exercises. Improved exercise technique, movement at target joints, use of target muscles after min to mod verbal, visual, tactile cues.   Person educated: Patient Education method: Explanation Education comprehension: verbalized understanding  HOME  EXERCISE PROGRAM:  Access Code: [email or web address] URL: [email or web address] Date: 08/17/2022 Prepared by: Tomasa Hose  Exercises - Clamshell with Resistance  - 2 x daily - 7 x weekly - 2 sets - 10 reps - Sidelying Reverse Clamshell with Resistance  - 2 x daily - 7 x weekly - 2 sets - 10 reps - Prone Hip Extension with Resistance Loop  - 2 x daily - 7 x weekly - 2 sets - 10 reps - Supine Bridge with Resistance Band  - 2 x daily - 7 x weekly - 2 sets - 10 reps - Side Stepping with Resistance at Thighs  - 2 x daily - 7 x weekly - 2 sets - 10 reps  GOALS:  Goals reviewed with patient? Yes  SHORT TERM GOALS: Target date: 08/31/2022  Pt will be independent with HEP in order to demonstrate increased ability to perform tasks related to occupation/hobbies. Baseline: Pt given HEP 08/13/22 and noted above Goal status: IN PROGRESS   LONG TERM GOALS: Target date: 01/26/2023    1.  Patient (> 75 years old) will complete five times sit to stand test in < 10 seconds indicating an increased LE strength and improved balance. Baseline: 11.81 sec 09/16/22: Not attempted 4/9: 11.7sec no UE sUpport Goal status: INITIAL  2.  Patient will increase FOTO score to equal to  or greater than 64 to demonstrate statistically significant improvement in mobility and quality of life.  Baseline: FOTO - 58 09/16/22: 56. 11/03/22: 59   Goal status: IN PROGRESS   3.  Patient will increase Berg Balance score by > 6 points to demonstrate decreased fall risk during functional activities. Baseline: 47 09/16/22: 54 Goal status: MET   4.  Patient will reduce timed up and go to <11 seconds to reduce fall risk and demonstrate improved transfer/gait ability. Baseline: 12.98 sec 09/16/22: 11.96 sec 3/28 11.45 4/9 10.4sec with SPC Goal status: MET  5.  Patient will increase 10 meter walk test to >1.85m/s as to improve gait speed for better community ambulation and to reduce fall risk. Baseline: 11.95 sec/0.84  m/s 09/16/22: 9.68 sec/1.033 m/s Goal status: MET  6.  Patient will increase six minute walk test distance to >1000 for progression to community ambulator and improve gait ability Baseline: 875 ft with [facility] 09/16/22: 917 ft with no AD 3/28: 859ft  Goal status: IN PROGRESS   7.  Patient will increase FGA score by > 4 points to demonstrate decreased fall risk during functional activities. Baseline: 15/30 4/9: 18/30 Goal status:In Progress.    ASSESSMENT:  CLINICAL IMPRESSION:   Continued with current plan of care as laid out in evaluation and recent prior sessions. Pt remains motivated to advance progress toward goals in order to maximize independence and safety at home.Pt having increased fatigue this date despite good night of sleep. Pt continues with interval style training with resisted walking interspersed with good tolerance. Pt continues to demonstrate progress toward goals AEB progression of interventions this date either in volume or intensity.         OBJECTIVE IMPAIRMENTS: Abnormal gait, decreased activity tolerance, decreased balance, decreased endurance, decreased mobility, difficulty walking, decreased strength, and decreased safety awareness.   ACTIVITY LIMITATIONS: carrying, lifting, bending, sitting, squatting, and locomotion level  PARTICIPATION LIMITATIONS: community activity and yard work  PERSONAL FACTORS: Age, Fitness, Past/current experiences, Time since onset of injury/illness/exacerbation, and 3+ comorbidities: DM2, Psoriasis, Prostate Cancer, GI complications, SOB on exertion  are also affecting patient's functional outcome.   REHAB POTENTIAL: Fair pt has significant influx of comorbidities that will likely impact their potential progress with PT.  CLINICAL DECISION MAKING: Stable/uncomplicated  EVALUATION COMPLEXITY: Moderate  PLAN:  PT FREQUENCY: 2x/week  PT DURATION: 12 weeks  PLANNED INTERVENTIONS: Therapeutic exercises, Therapeutic activity,  Neuromuscular re-education, Balance training, Gait training, Patient/Family education, Self Care, Joint mobilization, Joint manipulation, Stair training, Vestibular training, Canalith repositioning, Dry Needling, Electrical stimulation, Spinal manipulation, Spinal mobilization, Cryotherapy, Moist heat, and Manual therapy  PLAN FOR NEXT SESSION:   Continue to address strength and balance deficits and manual therapy for pain in the L hip as appropriate.    Norman Herrlich PT  Physical Therapist- [facility]    12:58 PM 12/09/22

## 2022-12-10 ENCOUNTER — Other Ambulatory Visit: Payer: Self-pay

## 2022-12-10 ENCOUNTER — Ambulatory Visit: Payer: Medicare Other

## 2022-12-10 ENCOUNTER — Ambulatory Visit
Admission: RE | Admit: 2022-12-10 | Discharge: 2022-12-10 | Disposition: A | Discharge status: Home / Self Care | Payer: Medicare Other | Source: Ambulatory Visit | Attending: Radiation Oncology | Admitting: Radiation Oncology

## 2022-12-10 DIAGNOSIS — C61 Malignant neoplasm of prostate: Secondary | ICD-10-CM | POA: Diagnosis not present

## 2022-12-10 LAB — RAD ONC ARIA SESSION SUMMARY
Course Elapsed Days: 28
Plan Fractions Treated to Date: 21
Plan Prescribed Dose Per Fraction: 2.6 Gy
Plan Total Fractions Prescribed: 28
Plan Total Prescribed Dose: 72.8 Gy
Reference Point Dosage Given to Date: 54.6 Gy
Reference Point Session Dosage Given: 2.6 Gy
Session Number: 21

## 2022-12-11 ENCOUNTER — Ambulatory Visit
Admission: RE | Admit: 2022-12-11 | Discharge: 2022-12-11 | Disposition: A | Discharge status: Home / Self Care | Payer: Medicare Other | Source: Ambulatory Visit | Attending: Radiation Oncology | Admitting: Radiation Oncology

## 2022-12-11 ENCOUNTER — Ambulatory Visit: Payer: Medicare Other

## 2022-12-11 ENCOUNTER — Other Ambulatory Visit: Payer: Self-pay

## 2022-12-11 DIAGNOSIS — C61 Malignant neoplasm of prostate: Secondary | ICD-10-CM | POA: Diagnosis not present

## 2022-12-11 LAB — RAD ONC ARIA SESSION SUMMARY
Course Elapsed Days: 29
Plan Fractions Treated to Date: 22
Plan Prescribed Dose Per Fraction: 2.6 Gy
Plan Total Fractions Prescribed: 28
Plan Total Prescribed Dose: 72.8 Gy
Reference Point Dosage Given to Date: 57.2 Gy
Reference Point Session Dosage Given: 2.6 Gy
Session Number: 22

## 2022-12-14 ENCOUNTER — Ambulatory Visit: Payer: Medicare Other

## 2022-12-14 ENCOUNTER — Inpatient Hospital Stay: Payer: Medicare Other

## 2022-12-14 ENCOUNTER — Ambulatory Visit
Admission: RE | Admit: 2022-12-14 | Discharge: 2022-12-14 | Disposition: A | Discharge status: Home / Self Care | Payer: Medicare Other | Source: Ambulatory Visit | Attending: Radiation Oncology | Admitting: Radiation Oncology

## 2022-12-14 ENCOUNTER — Other Ambulatory Visit: Payer: Self-pay

## 2022-12-14 DIAGNOSIS — C61 Malignant neoplasm of prostate: Secondary | ICD-10-CM

## 2022-12-14 LAB — RAD ONC ARIA SESSION SUMMARY
Course Elapsed Days: 32
Plan Fractions Treated to Date: 23
Plan Prescribed Dose Per Fraction: 2.6 Gy
Plan Total Fractions Prescribed: 28
Plan Total Prescribed Dose: 72.8 Gy
Reference Point Dosage Given to Date: 59.8 Gy
Reference Point Session Dosage Given: 2.6 Gy
Session Number: 23

## 2022-12-14 LAB — CBC (CANCER CENTER ONLY)
HCT: 37.6 % — ABNORMAL LOW (ref 39.0–52.0)
Hemoglobin: 12.2 g/dL — ABNORMAL LOW (ref 13.0–17.0)
MCH: 32.4 pg (ref 26.0–34.0)
MCHC: 32.4 g/dL (ref 30.0–36.0)
MCV: 99.7 fL (ref 80.0–100.0)
Platelet Count: 233 10*3/uL (ref 150–400)
RBC: 3.77 MIL/uL — ABNORMAL LOW (ref 4.22–5.81)
RDW: 13.9 % (ref 11.5–15.5)
WBC Count: 5.9 10*3/uL (ref 4.0–10.5)
nRBC: 0 % (ref 0.0–0.2)

## 2022-12-15 ENCOUNTER — Ambulatory Visit: Payer: Medicare Other

## 2022-12-15 ENCOUNTER — Other Ambulatory Visit: Payer: Self-pay

## 2022-12-15 ENCOUNTER — Ambulatory Visit: Payer: Medicare Other | Admitting: Physical Therapy

## 2022-12-15 ENCOUNTER — Ambulatory Visit
Admission: RE | Admit: 2022-12-15 | Discharge: 2022-12-15 | Disposition: A | Discharge status: Home / Self Care | Payer: Medicare Other | Source: Ambulatory Visit | Attending: Radiation Oncology | Admitting: Radiation Oncology

## 2022-12-15 DIAGNOSIS — C61 Malignant neoplasm of prostate: Secondary | ICD-10-CM | POA: Diagnosis not present

## 2022-12-15 LAB — RAD ONC ARIA SESSION SUMMARY
Course Elapsed Days: 33
Plan Fractions Treated to Date: 24
Plan Prescribed Dose Per Fraction: 2.6 Gy
Plan Total Fractions Prescribed: 28
Plan Total Prescribed Dose: 72.8 Gy
Reference Point Dosage Given to Date: 62.4 Gy
Reference Point Session Dosage Given: 2.6 Gy
Session Number: 24

## 2022-12-16 ENCOUNTER — Other Ambulatory Visit: Payer: Self-pay

## 2022-12-16 ENCOUNTER — Ambulatory Visit: Payer: Medicare Other

## 2022-12-16 ENCOUNTER — Ambulatory Visit
Admission: RE | Admit: 2022-12-16 | Discharge: 2022-12-16 | Disposition: A | Discharge status: Home / Self Care | Payer: Medicare Other | Source: Ambulatory Visit | Attending: Radiation Oncology | Admitting: Radiation Oncology

## 2022-12-16 DIAGNOSIS — C61 Malignant neoplasm of prostate: Secondary | ICD-10-CM | POA: Diagnosis not present

## 2022-12-16 LAB — RAD ONC ARIA SESSION SUMMARY
Course Elapsed Days: 34
Plan Fractions Treated to Date: 25
Plan Prescribed Dose Per Fraction: 2.6 Gy
Plan Total Fractions Prescribed: 28
Plan Total Prescribed Dose: 72.8 Gy
Reference Point Dosage Given to Date: 65 Gy
Reference Point Session Dosage Given: 2.6 Gy
Session Number: 25

## 2022-12-17 ENCOUNTER — Other Ambulatory Visit: Payer: Self-pay

## 2022-12-17 ENCOUNTER — Ambulatory Visit: Payer: Medicare Other

## 2022-12-17 ENCOUNTER — Ambulatory Visit
Admission: RE | Admit: 2022-12-17 | Discharge: 2022-12-17 | Disposition: A | Discharge status: Home / Self Care | Payer: Medicare Other | Source: Ambulatory Visit | Attending: Radiation Oncology | Admitting: Radiation Oncology

## 2022-12-17 DIAGNOSIS — C61 Malignant neoplasm of prostate: Secondary | ICD-10-CM | POA: Diagnosis not present

## 2022-12-17 LAB — RAD ONC ARIA SESSION SUMMARY
Course Elapsed Days: 35
Plan Fractions Treated to Date: 26
Plan Prescribed Dose Per Fraction: 2.6 Gy
Plan Total Fractions Prescribed: 28
Plan Total Prescribed Dose: 72.8 Gy
Reference Point Dosage Given to Date: 67.6 Gy
Reference Point Session Dosage Given: 2.6 Gy
Session Number: 26

## 2022-12-18 ENCOUNTER — Ambulatory Visit
Admission: RE | Admit: 2022-12-18 | Discharge: 2022-12-18 | Disposition: A | Discharge status: Home / Self Care | Payer: Medicare Other | Source: Ambulatory Visit | Attending: Radiation Oncology | Admitting: Radiation Oncology

## 2022-12-18 ENCOUNTER — Encounter: Payer: Self-pay | Admitting: Medical Oncology

## 2022-12-18 ENCOUNTER — Other Ambulatory Visit: Payer: Self-pay

## 2022-12-18 ENCOUNTER — Ambulatory Visit: Payer: Medicare Other

## 2022-12-18 ENCOUNTER — Inpatient Hospital Stay (HOSPITAL_BASED_OUTPATIENT_CLINIC_OR_DEPARTMENT_OTHER): Payer: Medicare Other | Admitting: Medical Oncology

## 2022-12-18 ENCOUNTER — Inpatient Hospital Stay: Payer: Medicare Other

## 2022-12-18 VITALS — BP 103/55 | HR 99 | Temp 97.2°F | Wt 184.6 lb

## 2022-12-18 DIAGNOSIS — R42 Dizziness and giddiness: Secondary | ICD-10-CM | POA: Diagnosis not present

## 2022-12-18 DIAGNOSIS — C61 Malignant neoplasm of prostate: Secondary | ICD-10-CM | POA: Diagnosis not present

## 2022-12-18 DIAGNOSIS — N400 Enlarged prostate without lower urinary tract symptoms: Secondary | ICD-10-CM

## 2022-12-18 LAB — RAD ONC ARIA SESSION SUMMARY
Course Elapsed Days: 36
Plan Fractions Treated to Date: 27
Plan Prescribed Dose Per Fraction: 2.6 Gy
Plan Total Fractions Prescribed: 28
Plan Total Prescribed Dose: 72.8 Gy
Reference Point Dosage Given to Date: 70.2 Gy
Reference Point Session Dosage Given: 2.6 Gy
Session Number: 27

## 2022-12-18 LAB — CBC WITH DIFFERENTIAL/PLATELET
Abs Immature Granulocytes: 0.01 10*3/uL (ref 0.00–0.07)
Basophils Absolute: 0 10*3/uL (ref 0.0–0.1)
Basophils Relative: 0 %
Eosinophils Absolute: 0.3 10*3/uL (ref 0.0–0.5)
Eosinophils Relative: 6 %
HCT: 37.2 % — ABNORMAL LOW (ref 39.0–52.0)
Hemoglobin: 12.1 g/dL — ABNORMAL LOW (ref 13.0–17.0)
Immature Granulocytes: 0 %
Lymphocytes Relative: 14 %
Lymphs Abs: 0.8 10*3/uL (ref 0.7–4.0)
MCH: 31.9 pg (ref 26.0–34.0)
MCHC: 32.5 g/dL (ref 30.0–36.0)
MCV: 98.2 fL (ref 80.0–100.0)
Monocytes Absolute: 0.5 10*3/uL (ref 0.1–1.0)
Monocytes Relative: 10 %
Neutro Abs: 3.7 10*3/uL (ref 1.7–7.7)
Neutrophils Relative %: 70 %
Platelets: 227 10*3/uL (ref 150–400)
RBC: 3.79 MIL/uL — ABNORMAL LOW (ref 4.22–5.81)
RDW: 13.2 % (ref 11.5–15.5)
WBC: 5.4 10*3/uL (ref 4.0–10.5)
nRBC: 0 % (ref 0.0–0.2)

## 2022-12-18 LAB — COMPREHENSIVE METABOLIC PANEL
ALT: 25 U/L (ref 0–44)
AST: 29 U/L (ref 15–41)
Albumin: 3.8 g/dL (ref 3.5–5.0)
Alkaline Phosphatase: 47 U/L (ref 38–126)
Anion gap: 10 (ref 5–15)
BUN: 14 mg/dL (ref 8–23)
CO2: 27 mmol/L (ref 22–32)
Calcium: 9.7 mg/dL (ref 8.9–10.3)
Chloride: 100 mmol/L (ref 98–111)
Creatinine, Ser: 0.77 mg/dL (ref 0.61–1.24)
GFR, Estimated: >60 mL/min (ref >60)
Glucose, Bld: 156 mg/dL — ABNORMAL HIGH (ref 70–99)
Potassium: 4.1 mmol/L (ref 3.5–5.1)
Sodium: 137 mmol/L (ref 135–145)
Total Bilirubin: 0.5 mg/dL (ref 0.3–1.2)
Total Protein: 7 g/dL (ref 6.5–8.1)

## 2022-12-18 LAB — MAGNESIUM: Magnesium: 1.7 mg/dL (ref 1.7–2.4)

## 2022-12-18 NOTE — Progress Notes (Signed)
Symptom Management Clinic [facility] [place] [facility] Telephone:([phone] Fax:([phone]  Patient Care Team: Marguarite Arbour, MD as PCP - General (Internal Medicine) Antonieta Iba, MD as PCP - Cardiology (Cardiology)   Name of the patient: Brian Callahan  [id number]  [birth year]   Oncological History: Prostate Cancer- managed by Urology Dr. Marvel Plan  Prostate cancer -Unfavorable intermediate prostate cancer -PSA (03/2022) 5.7 -prostate MRI (04/2022) - PI-RADS category 5 lesion of the right anterior peripheral zone and anterior fibromuscular stroma at the apex - Transcapsular spread:  Absent - Seminal vesicle involvement: Absent - Neurovascular bundle involvement: Absent - Pelvic adenopathy: No pathologic adenopathy. Small perirectal lymph nodes, 0.5 cm bilaterally - Bone metastasis: Absent -fusion prostate biopsy (06/2022) -prostate, ROI-4: Prostatic adenocarcinoma, Gleason score 4+3 equal 7 (grade group 3) involves 50% of one of the 4 cores (pattern 4 equals 95%) -nm PET (PSMA) (07/2022) - Focal intense activity in the RIGHT prostate gland apex consistent primary prostate adenocarcinoma - No evidence metastatic adenopathy in the pelvis or periaortic retroperitoneum. - No evidence of visceral metastasis or skeletal metastasis.  Current Treatment: IMRT- Dr. Rushie Chestnut   Date of visit: 12/18/22  Reason for Consult: Brian Callahan is a 82 y.o. male who mentioned that he was having some episodes of occasional dizziness. XRT team offered [facility] visit.   Today he reports that yesterday he noticed that he felt a bit lightheaded on occasion. He did not have any episodes of falls, chest pains, SOB, neuro changes, fever, cough. Currently asymptomatic but did have some similar symptoms this morning. He has been eating well but has significantly reduced his hydration due to the dysuria he gets from his IMRT treatments. AZO has been helping but he has not increase his fluid  intake back up yet. He has also tried cranberry juice which has helped. He wants IVF fluids but does not want to do anything that would cause him to have to use the bathroom more frequently.   PAST MEDICAL HISTORY: Past Medical History:  Diagnosis Date   Anemia    pt denies 01/18/20   BP (high blood pressure) 05/23/2015   BPH with obstruction/lower urinary tract symptoms    COPD (chronic obstructive pulmonary disease) (HCC)    Diabetes mellitus without complication (HCC)    diet controlled   Dyspnea    Encounter for long-term (current) use of other high-risk medications    GERD (gastroesophageal reflux disease)    gastritis   GI bleed    Gout    H/O hernia repair mid-90s   x2   History of elevated PSA    History of Rocky Mountain spotted fever    HLD (hyperlipidemia)    HTN (hypertension)    Hyperglycemia    Hyperuricemia    Incomplete bladder emptying    Macular degeneration    Obesity    Psoriasis    Psoriasis    Urinary frequency     PAST SURGICAL HISTORY:  Past Surgical History:  Procedure Laterality Date   APPENDECTOMY     BACK SURGERY     neck and spine 2007   CERVICAL SPINE SURGERY  2007   CHOLECYSTECTOMY N/A 10/18/2017   Procedure: LAPAROSCOPIC CHOLECYSTECTOMY;  Surgeon: Carolan Shiver, MD;  Location: [facility] ORS;  Service: General;  Laterality: N/A;   COLECTOMY  1998   partial, due to obstruction   COLONOSCOPY     lost 10 units of blood   COLONOSCOPY WITH PROPOFOL N/A 09/27/2019   Procedure:  COLONOSCOPY WITH PROPOFOL;  Surgeon: [name], Boykin Nearing, MD;  Location: [facility] ENDOSCOPY;  Service: Gastroenterology;  Laterality: N/A;   CYSTOSCOPY WITH INSERTION OF UROLIFT N/A 01/23/2020   Procedure: CYSTOSCOPY WITH INSERTION OF UROLIFT;  Surgeon: Riki Altes, MD;  Location: [facility] ORS;  Service: Urology;  Laterality: N/A;   ESOPHAGOGASTRODUODENOSCOPY N/A 10/14/2016   Procedure: ESOPHAGOGASTRODUODENOSCOPY (EGD);  Surgeon: Scot Jun, MD;  Location: [facility]  ENDOSCOPY;  Service: Endoscopy;  Laterality: N/A;   ESOPHAGOGASTRODUODENOSCOPY (EGD) WITH PROPOFOL N/A 09/27/2019   Procedure: ESOPHAGOGASTRODUODENOSCOPY (EGD) WITH PROPOFOL;  Surgeon: [name], Boykin Nearing, MD;  Location: [facility] ENDOSCOPY;  Service: Gastroenterology;  Laterality: N/A;   EYE SURGERY     bilateral cataracts   HERNIA REPAIR  mid-90s   x2  inguinal   PROSTATE BIOPSY  2013   SURGERY SCROTAL / TESTICULAR     for fibrous growth    HEMATOLOGY/ONCOLOGY HISTORY:  Oncology History   No history exists.    ALLERGIES:  is allergic to isosorbide nitrate, shellfish allergy, and trazodone and nefazodone.  MEDICATIONS:  Current Outpatient Medications  Medication Sig Dispense Refill   acetaminophen (TYLENOL) 500 MG tablet Take 500-1,000 mg by mouth every 6 (six) hours as needed (for headache/minor pain.).     clobetasol ointment (TEMOVATE) 0.05 % Apply 1 application topically daily as needed (psoriasis).      cyanocobalamin (VITAMIN B12) 1000 MCG tablet Take 1,000 mcg by mouth daily.     enalapril (VASOTEC) 5 MG tablet Take 1 tablet (5 mg total) by mouth 2 (two) times daily.     ezetimibe (ZETIA) 10 MG tablet Take 10 mg by mouth daily.     LORazepam (ATIVAN) 0.5 MG tablet Take 0.5 mg by mouth at bedtime as needed.     Melatonin 10 MG TABS Take 10 mg by mouth at bedtime as needed (sleeo).     metFORMIN (GLUCOPHAGE) 500 MG tablet Take 1 tablet by mouth 2 (two) times daily.     metoprolol succinate (TOPROL-XL) 50 MG 24 hr tablet Take 1.5 tablets by mouth daily.     Misc Natural Products (TART CHERRY ADVANCED PO) Take 12 mg by mouth daily.      tamsulosin (FLOMAX) 0.4 MG CAPS capsule TAKE 1 CAPSULE BY MOUTH TWICE  DAILY 200 capsule 2   tildrakizumab-asmn (ILUMYA) 100 MG/ML subcutaneous injection Inject 100 mg into the skin once.     No current facility-administered medications for this visit.    VITAL SIGNS: BP (!) 103/55 (BP Location: Left Arm, Patient Position: Sitting, Cuff Size: Normal)    Pulse 99   Temp (!) 97.2 F (36.2 C) (Tympanic)   Wt 184 lb 9.6 oz (83.7 kg)   BMI 28.91 kg/m  Filed Weights   12/18/22 1331  Weight: 184 lb 9.6 oz (83.7 kg)    Estimated body mass index is 28.91 kg/m as calculated from the following:   Height as of 10/26/22: 5\' 7"  (1.702 m).   Weight as of this encounter: 184 lb 9.6 oz (83.7 kg).  LABS: CBC:    Component Value Date/Time   WBC 5.4 12/18/2022 1313   HGB 12.1 (L) 12/18/2022 1313   HGB 12.2 (L) 12/14/2022 1242   HGB 7.3 (L) 12/06/2013 1059   HCT 37.2 (L) 12/18/2022 1313   HCT 21.6 (L) 12/06/2013 1059   PLT 227 12/18/2022 1313   PLT 233 12/14/2022 1242   PLT 188 12/06/2013 1059   MCV 98.2 12/18/2022 1313   MCV 92 12/06/2013 1059   NEUTROABS  3.7 12/18/2022 1313   NEUTROABS 3.8 12/06/2013 1059   LYMPHSABS 0.8 12/18/2022 1313   LYMPHSABS 1.1 12/06/2013 1059   MONOABS 0.5 12/18/2022 1313   MONOABS 0.5 12/06/2013 1059   EOSABS 0.3 12/18/2022 1313   EOSABS 0.3 12/06/2013 1059   BASOSABS 0.0 12/18/2022 1313   BASOSABS 0.0 12/06/2013 1059   Comprehensive Metabolic Panel:    Component Value Date/Time   NA 137 12/18/2022 1313   NA 141 12/02/2013 0404   K 4.1 12/18/2022 1313   K 4.0 12/02/2013 0404   CL 100 12/18/2022 1313   CL 110 (H) 12/02/2013 0404   CO2 27 12/18/2022 1313   CO2 28 12/02/2013 0404   BUN 14 12/18/2022 1313   BUN 12 12/02/2013 0404   CREATININE 0.77 12/18/2022 1313   CREATININE 0.88 12/02/2013 0404   GLUCOSE 156 (H) 12/18/2022 1313   GLUCOSE 172 (H) 12/02/2013 0404   CALCIUM 9.7 12/18/2022 1313   CALCIUM 7.9 (L) 12/02/2013 0404   AST 29 12/18/2022 1313   AST 25 12/01/2013 2158   ALT 25 12/18/2022 1313   ALT 21 12/01/2013 2158   ALKPHOS 47 12/18/2022 1313   ALKPHOS 64 12/01/2013 2158   BILITOT 0.5 12/18/2022 1313   BILITOT 0.2 12/01/2013 2158   PROT 7.0 12/18/2022 1313   PROT 7.3 12/01/2013 2158   ALBUMIN 3.8 12/18/2022 1313   ALBUMIN 3.6 12/01/2013 2158    RADIOGRAPHIC STUDIES: No results  found.  PERFORMANCE STATUS (ECOG) : 0 - Asymptomatic  Review of Systems Unless otherwise noted, a complete review of systems is negative.  Physical Exam General: NAD. No slurred speech  Cardiovascular: regular rate and rhythm, no peripheral edema Pulmonary: clear ant fields Abdomen: soft, nontender, + bowel sounds GU: no suprapubic tenderness Extremities: no edema, no joint deformities Skin: no rashes Neurological: Cranial nerves appear intact. Gait is normal  Assessment and Plan- Patient is a 82 y.o. male    Encounter Diagnosis  Name Primary?   Prostate cancer [facility]) Yes    Patient has prostate cancer which is followed by Dr. Marvel Plan and Dr. Aggie Cosier. His BP is a bit soft likely secondary to decreased oral hydration. He reports that he is able to drink more fluids easily and is not having any GI loss. Discussed risks/benefits of IVF vs oral replenishment. At this time he would prefer oral rehydration as he feels this would be easier for him to manage with his chronic dysuria. I have reviewed red flag signs and symptoms and asked that he follow up with PCP/Urologist if dizziness continues despite oral rehydration.    Patient expressed understanding and was in agreement with this plan. He also understands that He can call clinic at any time with any questions, concerns, or complaints.   Thank you for allowing me to participate in the care of this very pleasant patient.   Time Total: 15  Visit consisted of counseling and education dealing with the complex and emotionally intense issues of symptom management in the setting of serious illness.Greater than 50%  of this time was spent counseling and coordinating care related to the above assessment and plan.  Signed by: Clent Jacks, PA-C

## 2022-12-18 NOTE — Progress Notes (Signed)
Pt is feeling lightheaded and some dizziness

## 2022-12-22 ENCOUNTER — Ambulatory Visit: Payer: Medicare Other

## 2022-12-22 ENCOUNTER — Other Ambulatory Visit: Payer: Self-pay

## 2022-12-22 ENCOUNTER — Ambulatory Visit
Admission: RE | Admit: 2022-12-22 | Discharge: 2022-12-22 | Disposition: A | Discharge status: Home / Self Care | Payer: Medicare Other | Source: Ambulatory Visit | Attending: Radiation Oncology | Admitting: Radiation Oncology

## 2022-12-22 DIAGNOSIS — C61 Malignant neoplasm of prostate: Secondary | ICD-10-CM | POA: Diagnosis not present

## 2022-12-22 LAB — RAD ONC ARIA SESSION SUMMARY
Course Elapsed Days: 40
Plan Fractions Treated to Date: 28
Plan Prescribed Dose Per Fraction: 2.6 Gy
Plan Total Fractions Prescribed: 28
Plan Total Prescribed Dose: 72.8 Gy
Reference Point Dosage Given to Date: 72.8 Gy
Reference Point Session Dosage Given: 2.6 Gy
Session Number: 28

## 2022-12-23 ENCOUNTER — Ambulatory Visit: Payer: Medicare Other

## 2022-12-24 ENCOUNTER — Ambulatory Visit: Payer: Medicare Other

## 2022-12-25 ENCOUNTER — Ambulatory Visit: Payer: Medicare Other

## 2022-12-28 ENCOUNTER — Ambulatory Visit: Payer: Medicare Other

## 2022-12-28 ENCOUNTER — Inpatient Hospital Stay: Payer: Medicare Other

## 2022-12-29 ENCOUNTER — Ambulatory Visit: Payer: Medicare Other

## 2022-12-30 ENCOUNTER — Ambulatory Visit: Payer: Medicare Other

## 2022-12-31 ENCOUNTER — Ambulatory Visit: Payer: Medicare Other

## 2023-01-01 ENCOUNTER — Ambulatory Visit: Payer: Medicare Other

## 2023-01-04 ENCOUNTER — Ambulatory Visit: Payer: Medicare Other

## 2023-01-04 ENCOUNTER — Ambulatory Visit: Payer: Medicare Other | Admitting: Physical Therapy

## 2023-01-05 ENCOUNTER — Ambulatory Visit: Payer: Medicare Other

## 2023-01-06 ENCOUNTER — Ambulatory Visit: Payer: Medicare Other

## 2023-01-06 ENCOUNTER — Ambulatory Visit: Payer: Medicare Other | Attending: Neurology

## 2023-01-06 DIAGNOSIS — R2681 Unsteadiness on feet: Secondary | ICD-10-CM | POA: Diagnosis present

## 2023-01-06 DIAGNOSIS — M6281 Muscle weakness (generalized): Secondary | ICD-10-CM | POA: Diagnosis present

## 2023-01-06 DIAGNOSIS — R278 Other lack of coordination: Secondary | ICD-10-CM | POA: Diagnosis present

## 2023-01-06 DIAGNOSIS — R262 Difficulty in walking, not elsewhere classified: Secondary | ICD-10-CM | POA: Diagnosis present

## 2023-01-06 NOTE — Therapy (Signed)
OUTPATIENT PHYSICAL THERAPY NEURO TREATMENT     Patient Name: Brian Callahan MRN: [id number] DOB:October 25, 1940, 82 y.o., male Today's Date: 01/06/2023   PCP: Farrel Gobble, MD REFERRING PROVIDER: Theora Master, MD  END OF SESSION:   PT End of Session - 01/06/23 0922     Visit Number 29    Number of Visits 44    Date for PT Re-Evaluation 01/19/23    Authorization Type [employer] Medicare    Progress Note Due on Visit 30    PT Start Time 0930    PT Stop Time 1014    PT Time Calculation (min) 44 min    Equipment Utilized During Treatment Gait belt    Activity Tolerance Patient tolerated treatment well;No increased pain    Behavior During Therapy [facility] for tasks assessed/performed                       Past Medical History:  Diagnosis Date   Anemia    pt denies 01/18/20   BP (high blood pressure) 05/23/2015   BPH with obstruction/lower urinary tract symptoms    COPD (chronic obstructive pulmonary disease) (HCC)    Diabetes mellitus without complication (HCC)    diet controlled   Dyspnea    Encounter for long-term (current) use of other high-risk medications    GERD (gastroesophageal reflux disease)    gastritis   GI bleed    Gout    H/O hernia repair mid-90s   x2   History of elevated PSA    History of Rocky Mountain spotted fever    HLD (hyperlipidemia)    HTN (hypertension)    Hyperglycemia    Hyperuricemia    Incomplete bladder emptying    Macular degeneration    Obesity    Psoriasis    Psoriasis    Urinary frequency    Past Surgical History:  Procedure Laterality Date   APPENDECTOMY     BACK SURGERY     neck and spine 2007   CERVICAL SPINE SURGERY  2007   CHOLECYSTECTOMY N/A 10/18/2017   Procedure: LAPAROSCOPIC CHOLECYSTECTOMY;  Surgeon: Carolan Shiver, MD;  Location: [facility] OR[facility];  Service: General;  Laterality: N/A;   COLECTOMY  1998   partial, due to obstruction   COLONOSCOPY     lost 10 units of blood   COLONOSCOPY WITH PROPOFOL N/A  09/27/2019   Procedure: COLONOSCOPY WITH PROPOFOL;  Surgeon: [name], Boykin Nearing, MD;  Location: [facility] ENDOS[facility];  Service: Gastroenterology;  Laterality: N/A;   CYSTOSCOPY WITH INSERTION OF UROLIFT N/A 01/23/2020   Procedure: CYSTOSCOPY WITH INSERTION OF UROLIFT;  Surgeon: Riki Altes, MD;  Location: [facility];  Service: Urology;  Laterality: N/A;   ESOPHAGOGASTRODUODENOSCOPY N/A 10/14/2016   Procedure: ESOPHAGOGASTRODUODENOSCOPY (EGD);  Surgeon: Scot Jun, MD;  [facility] END[facility];  Service: Endoscopy;  Laterality: N/A;   ESOPHAGOGASTRODUODENOSCOPY (EGD) WITH PROPOFOL N/A 09/27/2019   Procedure: ESOPHAGOGASTRODUODENOSCOPY (EGD) WITH PROPOFOL;  Surgeon: [name], Boykin Nearing, MD;  Location: [facility] END[facility];  Service: Gastroenterology;  Laterality: N/A;   EYE SURGERY     bilateral cataracts   HERNIA REPAIR  mid-90s   x2  inguinal   PROSTATE BIOPSY  2013   SURGERY SCROTAL / TESTICULAR     for fibrous growth   Patient Active Problem List   Diagnosis Date Noted   Diabetes mellitus type 2 with complications (HCC) 03/23/2018   Chest pain with moderate risk for cardiac etiology 11/29/2017   Positive cardiac stress test 11/29/2017  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 11/08/2017   Esophagitis, reflux 11/08/2017   SOB (shortness of breath) on exertion 07/02/2017   BPH with obstruction/lower urinary tract symptoms 11/21/2015   History of elevated PSA 11/21/2015   H/O gastric ulcer 09/05/2015   Disorder of eyeball 05/23/2015   H/O infectious disease 05/23/2015   Blood glucose elevated 05/23/2015   BP (high blood pressure) 05/23/2015   HLD (hyperlipidemia) 05/23/2015   Psoriasis 05/23/2015   Absolute anemia 01/02/2014   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   Elevated prostate specific antigen (PSA) 01/02/2014   Elevated blood uric acid level 01/02/2014   Enlarged prostate without lower urinary tract symptoms (luts) 01/02/2014   Bleeding gastrointestinal 04/15/2012    ONSET DATE: ~10  years  REFERRING DIAG:  R26.89 (ICD-10-CM) - Balance problem  R26.9 (ICD-10-CM) - Gait abnormality    THERAPY DIAG:  Difficulty in walking, not elsewhere classified  Muscle weakness (generalized)  Rationale for Evaluation and Treatment: Rehabilitation   SUBJECTIVE:                                                                                                                                                                                             SUBJECTIVE STATEMENT: Patient reports 5/10 pain in L side of body.  Patient has not been seen in a month, has been getting treatment for prostate cancer. Goes to doctor tomorrow due to not being able to eat or drink   Pt accompanied by: self  PERTINENT HISTORY:  Pt is 82 y.o. male who has experienced a decline in his overall functional mobility and stability since Covid and being unable to go back to the gym.  Pt notes that he has noticed it being increasingly difficult performing tasks without losing his balance and just feels generalized weakness.  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    PAIN: Are you having pain? No  PRECAUTIONS: None  WEIGHT BEARING RESTRICTIONS: No  FALLS: Has patient fallen in last 6 months? No  PLOF: Independent; pt does state he has difficulty tying shoes, but he has slip on shoes that assist with that.  PATIENT GOALS: Pt wants to be more mobile and be able to exercise.  OBJECTIVE:    DIAGNOSTIC FINDINGS:    EXAM 05/29/22: MRI HEAD WITHOUT CONTRAST   IMPRESSION: 1. No acute intracranial abnormality. 2. 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 3. Small chronic right cerebellar infarct.   COGNITION: Overall cognitive status: Within  functional limits for tasks assessed             SENSATION: WFL   COORDINATION: WFL    POSTURE: rounded shoulders and forward head     LOWER EXTREMITY MMT:     MMT Right Eval Left Eval  Hip flexion 5 4+  Hip abduction 4+ 4+  Hip adduction 4+ 4+  Knee flexion 5 4+  Knee extension 5 4+  Ankle dorsiflexion 5 5  (Blank rows = not tested)     TRANSFERS: Assistive device utilized: Single point cane  Sit to stand: Modified independence Stand to sit: Modified independence Chair to chair: Modified independence     GAIT: Gait pattern: step through pattern, decreased arm swing- Right, decreased arm swing- Left, decreased stride length, and lateral lean- Left Distance walked: 41' Assistive device utilized: Single point cane Level of assistance: Modified independence with AD Comments: Pt able to ambulate fair with some abnormalities in his gait pattern.   FUNCTIONAL TESTS:  5 times sit to stand: 11.81. 4/9 11.7 Timed up and go (TUG): 12.98 4/9 10.4 6 minute walk test: Not performed at evaluation: 3/28 805 10 meter walk test: 0.84 m/s  Berg Balance Scale: 47  FGA15/30 4/9 18/30    PATIENT SURVEYS:  FOTO 58. 59    TODAY'S TREATMENT: DATE: 01/06/23   THEREX:   Scap retraction with GTB 2 sets of 10  Seated March with 2.5# AW- 2 sets of 10 reps Ambulation with 2.5# AW- and SPC- 175 feet- x 2 trials  Standing Shoulder horizontal Abd with GTB- 2 sets of 10 reps LAQ with 2.5#- 2 sets of 12 reps Ambulation with 2.5# AW- and SPC- 175 feet- x 2 trials  Standing step taps to 6 inch step with 2.5# AW 2 x 10 ea LE    Standing:  Ambulate 150 ft with #2 ankle weight and Spc; patient reports medium With UE support:  -march with #2 ankle weights  10x each LE ; cue for upright posture ; patient reports easy  -6" step toe taps 10x each LE -terminated due to pain in L hip. Pt advised to address L hip pain at appt tomorrow  Seated:  #2 ankle weights : -LAQ soccer ball kicks x2 minutes -B adduction w/ GTB around thighs x 10 -B abduction w/ GTB around thighs x 10 -B  hip ER w/ GTB around feet x 10 -scapular retractions x 10, VC to prevent shoulder elevation  -B biceps curls w/ 2# bar x 10 -B triceps pushdowns 2, " -witches brew stirring with 2# bar 10 x both directions  -lateral step overs w/ hedgehog, x 10 B -pedaling for DF w/ hedgehog x 10 B -hamstring push downs w/ hedgehog x 10 x 3 sec B     PATIENT EDUCATION:  Education details: Pt educated on role of PT and services provided during current POC, along with prognosis and information about the clinic.  Pt educated throughout session about proper posture and technique with exercises. Improved exercise technique, movement at target joints, use of target muscles after min to mod verbal, visual, tactile cues.   Person educated: Patient Education method: Explanation Education comprehension: verbalized understanding  HOME EXERCISE PROGRAM:  Access Code: [email or web address] URL: [email or web address] Date: 08/17/2022 Prepared by: Tomasa Hose  Exercises - Clamshell with Resistance  - 2 x daily - 7 x weekly - 2 sets - 10 reps - Sidelying Reverse Clamshell with Resistance  - 2 x daily - 7 x weekly - 2 sets -  10 reps - Prone Hip Extension with Resistance Loop  - 2 x daily - 7 x weekly - 2 sets - 10 reps - Supine Bridge with Resistance Band  - 2 x daily - 7 x weekly - 2 sets - 10 reps - Side Stepping with Resistance at Thighs  - 2 x daily - 7 x weekly - 2 sets - 10 reps  GOALS:  Goals reviewed with patient? Yes  SHORT TERM GOALS: Target date: 08/31/2022  Pt will be independent with HEP in order to demonstrate increased ability to perform tasks related to occupation/hobbies. Baseline: Pt given HEP 08/13/22 and noted above Goal status: IN PROGRESS   LONG TERM GOALS: Target date: 01/26/2023    1.  Patient (> 76 years old) will complete five times sit to stand test in < 10 seconds indicating an increased LE strength and improved balance. Baseline: 11.81 sec 09/16/22: Not attempted 4/9:  11.7sec no UE sUpport Goal status: INITIAL  2.  Patient will increase FOTO score to equal to or greater than 64 to demonstrate statistically significant improvement in mobility and quality of life.  Baseline: FOTO - 58 09/16/22: 56. 11/03/22: 59   Goal status: IN PROGRESS   3.  Patient will increase Berg Balance score by > 6 points to demonstrate decreased fall risk during functional activities. Baseline: 47 09/16/22: 54 Goal status: MET   4.  Patient will reduce timed up and go to <11 seconds to reduce fall risk and demonstrate improved transfer/gait ability. Baseline: 12.98 sec 09/16/22: 11.96 sec 3/28 11.45 4/9 10.4sec with SPC Goal status: MET  5.  Patient will increase 10 meter walk test to >1.58m/s as to improve gait speed for better community ambulation and to reduce fall risk. Baseline: 11.95 sec/0.84 m/s 09/16/22: 9.68 sec/1.033 m/s Goal status: MET  6.  Patient will increase six minute walk test distance to >1000 for progression to community ambulator and improve gait ability Baseline: 875 ft with [facility] 09/16/22: 917 ft with no AD 3/28: 837ft  Goal status: IN PROGRESS   7.  Patient will increase FGA score by > 4 points to demonstrate decreased fall risk during functional activities. Baseline: 15/30 4/9: 18/30 Goal status:In Progress.    ASSESSMENT:  CLINICAL IMPRESSION:  Patient is returning to PT after absence for radiation for breast cancer. Gentle return to exercise tolerated well with rest breaks. He is fatigued with interventions and does require termination of a task due to L hip pain. Patient encouraged to follow up with physician for L hip pain onset and issue regarding steps. Patient agreeable.  Pt continues to demonstrate progress toward goals;  progression of interventions this date either in volume or intensity.         OBJECTIVE IMPAIRMENTS: Abnormal gait, decreased activity tolerance, decreased balance, decreased endurance, decreased mobility, difficulty  walking, decreased strength, and decreased safety awareness.   ACTIVITY LIMITATIONS: carrying, lifting, bending, sitting, squatting, and locomotion level  PARTICIPATION LIMITATIONS: community activity and yard work  PERSONAL FACTORS: Age, Fitness, Past/current experiences, Time since onset of injury/illness/exacerbation, and 3+ comorbidities: DM2, Psoriasis, Prostate Cancer, GI complications, SOB on exertion  are also affecting patient's functional outcome.   REHAB POTENTIAL: Fair pt has significant influx of comorbidities that will likely impact their potential progress with PT.  CLINICAL DECISION MAKING: Stable/uncomplicated  EVALUATION COMPLEXITY: Moderate  PLAN:  PT FREQUENCY: 2x/week  PT DURATION: 12 weeks  PLANNED INTERVENTIONS: Therapeutic exercises, Therapeutic activity, Neuromuscular re-education, Balance training, Gait training, Patient/Family education, Self Care,  Joint mobilization, Joint manipulation, Stair training, Vestibular training, Canalith repositioning, Dry Needling, Electrical stimulation, Spinal manipulation, Spinal mobilization, Cryotherapy, Moist heat, and Manual therapy  PLAN FOR NEXT SESSION:   Continue to address strength and balance deficits and manual therapy for pain in the L hip as appropriate.    Precious Bard PT  Physical Therapist- [place]  [facility]    10:44 AM 01/06/23

## 2023-01-07 ENCOUNTER — Ambulatory Visit: Payer: Medicare Other

## 2023-01-11 ENCOUNTER — Ambulatory Visit: Payer: Medicare Other | Admitting: Physical Therapy

## 2023-01-12 ENCOUNTER — Ambulatory Visit: Payer: Medicare Other

## 2023-01-12 DIAGNOSIS — R2681 Unsteadiness on feet: Secondary | ICD-10-CM

## 2023-01-12 DIAGNOSIS — R262 Difficulty in walking, not elsewhere classified: Secondary | ICD-10-CM | POA: Diagnosis not present

## 2023-01-12 DIAGNOSIS — M6281 Muscle weakness (generalized): Secondary | ICD-10-CM

## 2023-01-12 DIAGNOSIS — R278 Other lack of coordination: Secondary | ICD-10-CM

## 2023-01-12 NOTE — Therapy (Signed)
OUTPATIENT PHYSICAL THERAPY NEURO TREATMENT/Physical Therapy Progress Note   Dates of reporting period  10/27/2022   to   01/12/2023      Patient Name: Brian Callahan MRN: [id number] DOB:January 30, 1941, 82 y.o., male Today's Date: 01/12/2023   PCP: Farrel Gobble, MD REFERRING PROVIDER: Theora Master, MD  END OF SESSION:   PT End of Session - 01/12/23 0931     Visit Number 30    Number of Visits 44    Date for PT Re-Evaluation 01/19/23    Authorization Type [employer]    Progress Note Due on Visit 30    PT Start Time 0932    PT Stop Time 1014    PT Time Calculation (min) 42 min    Equipment Utilized During Treatment Gait belt    Activity Tolerance Patient tolerated treatment well;No increased pain    Behavior During Therapy [facility] for tasks assessed/performed                       Past Medical History:  Diagnosis Date   Anemia    pt denies 01/18/20   BP (high blood pressure) 05/23/2015   BPH with obstruction/lower urinary tract symptoms    COPD (chronic obstructive pulmonary disease) (HCC)    Diabetes mellitus without complication (HCC)    diet controlled   Dyspnea    Encounter for long-term (current) use of other high-risk medications    GERD (gastroesophageal reflux disease)    gastritis   GI bleed    Gout    H/O hernia repair mid-90s   x2   History of elevated PSA    History of [place] spotted fever    HLD (hyperlipidemia)    HTN (hypertension)    Hyperglycemia    Hyperuricemia    Incomplete bladder emptying    Macular degeneration    Obesity    Psoriasis    Psoriasis    Urinary frequency    Past Surgical History:  Procedure Laterality Date   APPENDECTOMY     BACK SURGERY     neck and spine 2007   CERVICAL SPINE SURGERY  2007   CHOLECYSTECTOMY N/A 10/18/2017   Procedure: LAPAROSCOPIC CHOLECYSTECTOMY;  Surgeon: Carolan Shiver, MD;  Location: [facility];  Service: General;  Laterality: N/A;   COLECTOMY  1998   partial, due to  obstruction   COLONOSCOPY     lost 10 units of blood   COLONOSCOPY WITH PROPOFOL N/A 09/27/2019   Procedure: COLONOSCOPY WITH PROPOFOL;  Surgeon: [name], Boykin Nearing, MD;  Location: [facility] END[facility];  Service: Gastroenterology;  Laterality: N/A;   CYSTOSCOPY WITH INSERTION OF UROLIFT N/A 01/23/2020   Procedure: CYSTOSCOPY WITH INSERTION OF UROLIFT;  Surgeon: Riki Altes, MD;  Location: [facility];  Service: Urology;  Laterality: N/A;   ESOPHAGOGASTRODUODENOSCOPY N/A 10/14/2016   Procedure: ESOPHAGOGASTRODUODENOSCOPY (EGD);  Surgeon: Scot Jun, MD;  Location: [facility];  Service: Endoscopy;  Laterality: N/A;   ESOPHAGOGASTRODUODENOSCOPY (EGD) WITH PROPOFOL N/A 09/27/2019   Procedure: ESOPHAGOGASTRODUODENOSCOPY (EGD) WITH PROPOFOL;  Surgeon: [name], Boykin Nearing, MD;  Location: [facility] ENDOSCOPY;  Service: Gastroenterology;  Laterality: N/A;   EYE SURGERY     bilateral cataracts   HERNIA REPAIR  mid-90s   x2  inguinal   PROSTATE BIOPSY  2013   SURGERY SCROTAL / TESTICULAR     for fibrous growth   Patient Active Problem List   Diagnosis Date Noted   Diabetes mellitus type 2 with complications (HCC) 03/23/2018  Chest pain with moderate risk for cardiac etiology 11/29/2017   Positive cardiac stress test 11/29/2017   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 11/08/2017   Esophagitis, reflux 11/08/2017   SOB (shortness of breath) on exertion 07/02/2017   BPH with obstruction/lower urinary tract symptoms 11/21/2015   History of elevated PSA 11/21/2015   H/O gastric ulcer 09/05/2015   Disorder of eyeball 05/23/2015   H/O infectious disease 05/23/2015   Blood glucose elevated 05/23/2015   BP (high blood pressure) 05/23/2015   HLD (hyperlipidemia) 05/23/2015   Psoriasis 05/23/2015   Absolute anemia 01/02/2014   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   Elevated prostate specific antigen (PSA) 01/02/2014   Elevated blood uric acid level 01/02/2014   Enlarged prostate without lower urinary tract  symptoms (luts) 01/02/2014   Bleeding gastrointestinal 04/15/2012    ONSET DATE: ~10 years  REFERRING DIAG:  R26.89 (ICD-10-CM) - Balance problem  R26.9 (ICD-10-CM) - Gait abnormality    THERAPY DIAG:  Difficulty in walking, not elsewhere classified  Muscle weakness (generalized)  Unsteadiness on feet  Other lack of coordination  Rationale for Evaluation and Treatment: Rehabilitation   SUBJECTIVE:                                                                                                                                                                                             SUBJECTIVE STATEMENT: Pt reports continued L shoulder pain. He reports no falls, no other updates.   Pt accompanied by: self  PERTINENT HISTORY:  Pt is 82 y.o. male who has experienced a decline in his overall functional mobility and stability since Covid and being unable to go back to the gym.  Pt notes that he has noticed it being increasingly difficult performing tasks without losing his balance and just feels generalized weakness.  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    PAIN: Are you having pain? No  PRECAUTIONS: None  WEIGHT BEARING RESTRICTIONS: No  FALLS: Has patient fallen in last 6 months? No  PLOF: Independent; pt does state he has difficulty tying shoes, but he has slip on shoes that assist with that.  PATIENT GOALS: Pt wants to be more mobile and be able to exercise.  OBJECTIVE:    DIAGNOSTIC FINDINGS:    EXAM 05/29/22: MRI HEAD WITHOUT CONTRAST   IMPRESSION: 1. No acute intracranial abnormality. 2. 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 3. Small chronic right cerebellar infarct.   COGNITION: Overall cognitive  status: Within functional limits for tasks assessed             SENSATION: WFL    COORDINATION: WFL   POSTURE: rounded shoulders and forward head     LOWER EXTREMITY MMT:     MMT Right Eval Left Eval  Hip flexion 5 4+  Hip abduction 4+ 4+  Hip adduction 4+ 4+  Knee flexion 5 4+  Knee extension 5 4+  Ankle dorsiflexion 5 5  (Blank rows = not tested)     TRANSFERS: Assistive device utilized: Single point cane  Sit to stand: Modified independence Stand to sit: Modified independence Chair to chair: Modified independence     GAIT: Gait pattern: step through pattern, decreased arm swing- Right, decreased arm swing- Left, decreased stride length, and lateral lean- Left Distance walked: 24' Assistive device utilized: Single point cane Level of assistance: Modified independence with AD Comments: Pt able to ambulate fair with some abnormalities in his gait pattern.   FUNCTIONAL TESTS:  5 times sit to stand: 11.81. 4/9 11.7 Timed up and go (TUG): 12.98 4/9 10.4 6 minute walk test: Not performed at evaluation: 3/28 805 10 meter walk test: 0.84 m/s  Berg Balance Scale: 47  FGA15/30 4/9 18/30    PATIENT SURVEYS:  FOTO 58. 59    TODAY'S TREATMENT: DATE: 01/12/23   TA: Goal testing completed on this date for progress note. Please refer to goal section below for details. PT instructed pt in technique and indications of goal performance throughout.    PATIENT EDUCATION:  Education details:   Pt educated throughout session about proper posture and technique with exercises. Improved exercise technique, movement at target joints, use of target muscles after min to mod verbal, visual, tactile cues. Goals, goal performance, plan   Person educated: Patient Education method: Explanation, Demonstration, and Verbal cues Education comprehension: verbalized understanding and returned demonstration  HOME EXERCISE PROGRAM:  Continue HEP as previously given Access Code: [email or web address] URL: [email or web address] Date: 08/17/2022 Prepared by: Tomasa Hose  Exercises - Clamshell with Resistance  - 2 x daily - 7 x weekly - 2 sets - 10 reps - Sidelying Reverse Clamshell with Resistance  - 2 x daily - 7 x weekly - 2 sets - 10 reps - Prone Hip Extension with Resistance Loop  - 2 x daily - 7 x weekly - 2 sets - 10 reps - Supine Bridge with Resistance Band  - 2 x daily - 7 x weekly - 2 sets - 10 reps - Side Stepping with Resistance at Thighs  - 2 x daily - 7 x weekly - 2 sets - 10 reps  GOALS:  Goals reviewed with patient? Yes  SHORT TERM GOALS: Target date: 08/31/2022  Pt will be independent with HEP in order to demonstrate increased ability to perform tasks related to occupation/hobbies. Baseline: Pt given HEP 08/13/22 and noted above; 01/12/23: Pt states he is not "doing good" with his home exercises due to hip problems, pt instructed not to perform intervention causing pain Goal status: IN PROGRESS   LONG TERM GOALS: Target date: 01/26/2023    1.  Patient (> 28 years old) will complete five times sit to stand test in < 10 seconds indicating an increased LE strength and improved balance. Baseline: 11.81 sec 09/16/22: Not attempted 4/9: 11.7sec no UE sUpport; 6/18: 11 seconds Goal status: INITIAL  2.  Patient will increase FOTO score to equal to or greater than 64 to demonstrate statistically significant improvement in mobility and  quality of life.  Baseline: FOTO - 58 09/16/22: 56. 11/03/22: 59; 01/12/23: 64  Goal status: GOAL MET    3.  Patient will increase Berg Balance score by > 6 points to demonstrate decreased fall risk during functional activities. Baseline: 47 09/16/22: 54 Goal status: MET   4.  Patient will reduce timed up and go to <11 seconds to reduce fall risk and demonstrate improved transfer/gait ability. Baseline: 12.98 sec 09/16/22: 11.96 sec 3/28 11.45 4/9 10.4sec with SPC Goal status: MET  5.  Patient will increase 10 meter walk test to >1.66m/s as to improve gait speed for better community ambulation and to reduce  fall risk. Baseline: 11.95 sec/0.84 m/s 09/16/22: 9.68 sec/1.033 m/s Goal status: MET  6.  Patient will increase six minute walk test distance to >1000 for progression to community ambulator and improve gait ability Baseline: 875 ft with [facility] 09/16/22: 917 ft with no AD 3/28: 871ft ; 01/12/23: 765 ft  Goal status: IN PROGRESS   7.  Patient will increase FGA score by > 4 points to demonstrate decreased fall risk during functional activities. Baseline: 15/30 4/9: 18/30; 6/18:  18/30 Goal status:In Progress.    ASSESSMENT:  CLINICAL IMPRESSION: Goals reassessed for progress note. Pt with similar scores compared to previous assessment, likely due to prolonged absence from PT for CA treatment. However, pt did show progress by meeting FOTO goal, indicating increased perception of functional mobility and QOL. The pt will benefit from further skilled PT to address deficits in balance, strength, mobility and gait in order to increase QOL and decrease fall risk.   OBJECTIVE IMPAIRMENTS: Abnormal gait, decreased activity tolerance, decreased balance, decreased endurance, decreased mobility, difficulty walking, decreased strength, and decreased safety awareness.   ACTIVITY LIMITATIONS: carrying, lifting, bending, sitting, squatting, and locomotion level  PARTICIPATION LIMITATIONS: community activity and yard work  PERSONAL FACTORS: Age, Fitness, Past/current experiences, Time since onset of injury/illness/exacerbation, and 3+ comorbidities: DM2, Psoriasis, Prostate Cancer, GI complications, SOB on exertion  are also affecting patient's functional outcome.   REHAB POTENTIAL: Fair pt has significant influx of comorbidities that will likely impact their potential progress with PT.  CLINICAL DECISION MAKING: Stable/uncomplicated  EVALUATION COMPLEXITY: Moderate  PLAN:  PT FREQUENCY: 2x/week  PT DURATION: 12 weeks  PLANNED INTERVENTIONS: Therapeutic exercises, Therapeutic activity, Neuromuscular  re-education, Balance training, Gait training, Patient/Family education, Self Care, Joint mobilization, Joint manipulation, Stair training, Vestibular training, Canalith repositioning, Dry Needling, Electrical stimulation, Spinal manipulation, Spinal mobilization, Cryotherapy, Moist heat, and Manual therapy  PLAN FOR NEXT SESSION:   Continue to address strength and balance deficits and manual therapy for pain in the L hip as appropriate. Continue plan   Baird Kay PT  Physical Therapist- [facility]    2:15 PM 01/12/23

## 2023-01-13 ENCOUNTER — Ambulatory Visit: Payer: Medicare Other | Admitting: Physical Therapy

## 2023-01-15 ENCOUNTER — Ambulatory Visit: Payer: Medicare Other | Admitting: Physical Therapy

## 2023-01-18 ENCOUNTER — Ambulatory Visit: Payer: Medicare Other | Admitting: Physical Therapy

## 2023-01-19 ENCOUNTER — Ambulatory Visit: Payer: Medicare Other

## 2023-01-19 DIAGNOSIS — R262 Difficulty in walking, not elsewhere classified: Secondary | ICD-10-CM

## 2023-01-19 DIAGNOSIS — R2681 Unsteadiness on feet: Secondary | ICD-10-CM

## 2023-01-19 DIAGNOSIS — R278 Other lack of coordination: Secondary | ICD-10-CM

## 2023-01-19 DIAGNOSIS — M6281 Muscle weakness (generalized): Secondary | ICD-10-CM

## 2023-01-19 NOTE — Therapy (Signed)
OUTPATIENT PHYSICAL THERAPY NEURO TREATMENT/DISCHARGE SUMMARY    Patient Name: Brian Callahan MRN: [id number] DOB:14-Nov-1940, 82 y.o., male Today's Date: 01/20/2023   PCP: Farrel Gobble, MD REFERRING PROVIDER: Theora Master, MD  END OF SESSION:   PT End of Session - 01/19/23 0928     Visit Number 31    Number of Visits 44    Date for PT Re-Evaluation 01/19/23    Authorization Type [employer] Medicare    Progress Note Due on Visit 40    PT Start Time 0929    PT Stop Time 1014    PT Time Calculation (min) 45 min    Equipment Utilized During Treatment Gait belt    Activity Tolerance Patient tolerated treatment well;No increased pain    Behavior During Therapy [facility] for tasks assessed/performed                       Past Medical History:  Diagnosis Date   Anemia    pt denies 01/18/20   BP (high blood pressure) 05/23/2015   BPH with obstruction/lower urinary tract symptoms    COPD (chronic obstructive pulmonary disease) (HCC)    Diabetes mellitus without complication (HCC)    diet controlled   Dyspnea    Encounter for long-term (current) use of other high-risk medications    GERD (gastroesophageal reflux disease)    gastritis   GI bleed    Gout    H/O hernia repair mid-90s   x2   History of elevated PSA    History of Rocky Mountain spotted fever    HLD (hyperlipidemia)    HTN (hypertension)    Hyperglycemia    Hyperuricemia    Incomplete bladder emptying    Macular degeneration    Obesity    Psoriasis    Psoriasis    Urinary frequency    Past Surgical History:  Procedure Laterality Date   APPENDECTOMY     BACK SURGERY     neck and spine 2007   CERVICAL SPINE SURGERY  2007   CHOLECYSTECTOMY N/A 10/18/2017   Procedure: LAPAROSCOPIC CHOLECYSTECTOMY;  Surgeon: Carolan Shiver, MD;  Location: [facility] ORS;  Service: General;  Laterality: N/A;   COLECTOMY  1998   partial, due to obstruction   COLONOSCOPY     lost 10 units of blood   COLONOSCOPY WITH  PROPOFOL N/A 09/27/2019   Procedure: COLONOSCOPY WITH PROPOFOL;  Surgeon: [name], Boykin Nearing, MD;  Location: [facility] ENDOSCOPY;  Service: Gastroenterology;  Laterality: N/A;   CYSTOSCOPY WITH INSERTION OF UROLIFT N/A 01/23/2020   Procedure: CYSTOSCOPY WITH INSERTION OF UROLIFT;  Surgeon: Riki Altes, MD;  Location: [facility] ORS;  Service: Urology;  Laterality: N/A;   ESOPHAGOGASTRODUODENOSCOPY N/A 10/14/2016   Procedure: ESOPHAGOGASTRODUODENOSCOPY (EGD);  Surgeon: Scot Jun, MD;  Location: [facility] ENDOSCOPY;  Service: Endoscopy;  Laterality: N/A;   ESOPHAGOGASTRODUODENOSCOPY (EGD) WITH PROPOFOL N/A 09/27/2019   Procedure: ESOPHAGOGASTRODUODENOSCOPY (EGD) WITH PROPOFOL;  Surgeon: [name], Boykin Nearing, MD;  Location: [facility] ENDOSCOPY;  Service: Gastroenterology;  Laterality: N/A;   EYE SURGERY     bilateral cataracts   HERNIA REPAIR  mid-90s   x2  inguinal   PROSTATE BIOPSY  2013   SURGERY SCROTAL / TESTICULAR     for fibrous growth   Patient Active Problem List   Diagnosis Date Noted   Diabetes mellitus type 2 with complications (HCC) 03/23/2018   Chest pain with moderate risk for cardiac etiology 11/29/2017   Positive cardiac stress test 11/29/2017  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 11/08/2017   Esophagitis, reflux 11/08/2017   SOB (shortness of breath) on exertion 07/02/2017   BPH with obstruction/lower urinary tract symptoms 11/21/2015   History of elevated PSA 11/21/2015   H/O gastric ulcer 09/05/2015   Disorder of eyeball 05/23/2015   H/O infectious disease 05/23/2015   Blood glucose elevated 05/23/2015   BP (high blood pressure) 05/23/2015   HLD (hyperlipidemia) 05/23/2015   Psoriasis 05/23/2015   Absolute anemia 01/02/2014   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   Elevated prostate specific antigen (PSA) 01/02/2014   Elevated blood uric acid level 01/02/2014   Enlarged prostate without lower urinary tract symptoms (luts) 01/02/2014   Bleeding gastrointestinal 04/15/2012     ONSET DATE: ~10 years  REFERRING DIAG:  R26.89 (ICD-10-CM) - Balance problem  R26.9 (ICD-10-CM) - Gait abnormality    THERAPY DIAG:  Difficulty in walking, not elsewhere classified  Muscle weakness (generalized)  Unsteadiness on feet  Other lack of coordination  Rationale for Evaluation and Treatment: Rehabilitation   SUBJECTIVE:                                                                                                                                                                                             SUBJECTIVE STATEMENT: Pt reports he feels he is doing about as well as he can. Still not sleeping well- pain in left hip not as bad overall and left shoulder feeling okay.    Pt accompanied by: self  PERTINENT HISTORY:  Pt is 82 y.o. male who has experienced a decline in his overall functional mobility and stability since Covid and being unable to go back to the gym.  Pt notes that he has noticed it being increasingly difficult performing tasks without losing his balance and just feels generalized weakness.  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    PAIN: Are you having pain? No  PRECAUTIONS: None  WEIGHT BEARING RESTRICTIONS: No  FALLS: Has patient fallen in last 6 months? No  PLOF: Independent; pt does state he has difficulty tying shoes, but he has slip on shoes that assist with that.  PATIENT GOALS: Pt wants to be more mobile and be able to exercise.  OBJECTIVE:    DIAGNOSTIC FINDINGS:    EXAM 05/29/22: MRI HEAD WITHOUT CONTRAST   IMPRESSION: 1. No acute intracranial abnormality. 2. 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 3. Small chronic right cerebellar infarct.   COGNITION: Overall cognitive  status: Within functional limits for tasks assessed              SENSATION: WFL   COORDINATION: WFL   POSTURE: rounded shoulders and forward head     LOWER EXTREMITY MMT:     MMT Right Eval Left Eval  Hip flexion 5 4+  Hip abduction 4+ 4+  Hip adduction 4+ 4+  Knee flexion 5 4+  Knee extension 5 4+  Ankle dorsiflexion 5 5  (Blank rows = not tested)     TRANSFERS: Assistive device utilized: Single point cane  Sit to stand: Modified independence Stand to sit: Modified independence Chair to chair: Modified independence     GAIT: Gait pattern: step through pattern, decreased arm swing- Right, decreased arm swing- Left, decreased stride length, and lateral lean- Left Distance walked: 51' Assistive device utilized: Single point cane Level of assistance: Modified independence with AD Comments: Pt able to ambulate fair with some abnormalities in his gait pattern.   FUNCTIONAL TESTS:  5 times sit to stand: 11.81. 4/9 11.7 Timed up and go (TUG): 12.98 4/9 10.4 6 minute walk test: Not performed at evaluation: 3/28 805 10 meter walk test: 0.84 m/s  Berg Balance Scale: 47  FGA15/30 4/9 18/30    PATIENT SURVEYS:  FOTO 58. 59    TODAY'S TREATMENT: DATE: 01/19/2023   *Did not reassess goals as they were just addressed last visit and patient requested discharge and review/instruction of some exercises he could perform at home.   TE:   Instructed in basic LE strengthening that patient could perform on his own as he is requesting today as his last day.   -Standing hip march - Standing hip abd - Standing hip ext -Standing ham curl -Standing mini squats -Standing calf raises -Standing toe raises 15 reps each  with VC and patient able to return demonstration.    PATIENT EDUCATION:  Education details:   Pt educated throughout session about proper posture and technique with exercises. Improved exercise technique, movement at target joints, use of target muscles after min to mod verbal, visual, tactile cues. Goals, goal performance,  plan   Person educated: Patient Education method: Explanation, Demonstration, and Verbal cues Education comprehension: verbalized understanding and returned demonstration  HOME EXERCISE PROGRAM:  Continue HEP as previously given Access Code: [email or web address] URL: [email or web address] Date: 08/17/2022 Prepared by: Tomasa Hose  Exercises - Clamshell with Resistance  - 2 x daily - 7 x weekly - 2 sets - 10 reps - Sidelying Reverse Clamshell with Resistance  - 2 x daily - 7 x weekly - 2 sets - 10 reps - Prone Hip Extension with Resistance Loop  - 2 x daily - 7 x weekly - 2 sets - 10 reps - Supine Bridge with Resistance Band  - 2 x daily - 7 x weekly - 2 sets - 10 reps - Side Stepping with Resistance at Thighs  - 2 x daily - 7 x weekly - 2 sets - 10 reps  GOALS:  Goals reviewed with patient? Yes  SHORT TERM GOALS: Target date: 08/31/2022  Pt will be independent with HEP in order to demonstrate increased ability to perform tasks related to occupation/hobbies. Baseline: Pt given HEP 08/13/22 and noted above; 01/12/23: Pt states he is not "doing good" with his home exercises due to hip problems, pt instructed not to perform intervention causing pain Goal status: IN PROGRESS   LONG TERM GOALS: Target date: 01/26/2023    1.  Patient (> 35 years old) will  complete five times sit to stand test in < 10 seconds indicating an increased LE strength and improved balance. Baseline: 11.81 sec 09/16/22: Not attempted 4/9: 11.7sec no UE sUpport; 6/18: 11 seconds Goal status: INITIAL  2.  Patient will increase FOTO score to equal to or greater than 64 to demonstrate statistically significant improvement in mobility and quality of life.  Baseline: FOTO - 58 09/16/22: 56. 11/03/22: 59; 01/12/23: 64  Goal status: GOAL MET    3.  Patient will increase Berg Balance score by > 6 points to demonstrate decreased fall risk during functional activities. Baseline: 47 09/16/22: 54 Goal status: MET   4.   Patient will reduce timed up and go to <11 seconds to reduce fall risk and demonstrate improved transfer/gait ability. Baseline: 12.98 sec 09/16/22: 11.96 sec 3/28 11.45 4/9 10.4sec with SPC Goal status: MET  5.  Patient will increase 10 meter walk test to >1.79m/s as to improve gait speed for better community ambulation and to reduce fall risk. Baseline: 11.95 sec/0.84 m/s 09/16/22: 9.68 sec/1.033 m/s Goal status: MET  6.  Patient will increase six minute walk test distance to >1000 for progression to community ambulator and improve gait ability Baseline: 875 ft with [facility] 09/16/22: 917 ft with no AD 3/28: 820ft ; 01/12/23: 765 ft  Goal status: IN PROGRESS   7.  Patient will increase FGA score by > 4 points to demonstrate decreased fall risk during functional activities. Baseline: 15/30 4/9: 18/30; 6/18:  18/30 Goal status:In Progress.    ASSESSMENT:  CLINICAL IMPRESSION: Patient presents with good motivation yet reports ongoing hip pain and requesting to just do his exercises at home at this point. He was educated in some basic LE strengthening exercises to be performed at home and did well without any increased pain. Goals were not addressed on last visit as they were just assessed last visit and patient wanted to concentrate on his last visit with learning exercises to be performed at home. He will be discharged today at his request and has ortho MD visit next week to evaluate ongoing left hip pain.   OBJECTIVE IMPAIRMENTS: Abnormal gait, decreased activity tolerance, decreased balance, decreased endurance, decreased mobility, difficulty walking, decreased strength, and decreased safety awareness.   ACTIVITY LIMITATIONS: carrying, lifting, bending, sitting, squatting, and locomotion level  PARTICIPATION LIMITATIONS: community activity and yard work  PERSONAL FACTORS: Age, Fitness, Past/current experiences, Time since onset of injury/illness/exacerbation, and 3+ comorbidities: DM2,  Psoriasis, Prostate Cancer, GI complications, SOB on exertion  are also affecting patient's functional outcome.   REHAB POTENTIAL: Fair pt has significant influx of comorbidities that will likely impact their potential progress with PT.  CLINICAL DECISION MAKING: Stable/uncomplicated  EVALUATION COMPLEXITY: Moderate  PLAN:  PT FREQUENCY: 2x/week  PT DURATION: 12 weeks  PLANNED INTERVENTIONS: Therapeutic exercises, Therapeutic activity, Neuromuscular re-education, Balance training, Gait training, Patient/Family education, Self Care, Joint mobilization, Joint manipulation, Stair training, Vestibular training, Canalith repositioning, Dry Needling, Electrical stimulation, Spinal manipulation, Spinal mobilization, Cryotherapy, Moist heat, and Manual therapy  PLAN FOR NEXT SESSION:   Discharge patient today.    Lenda Kelp PT  Physical Therapist- [facility]    10:09 AM 01/20/23

## 2023-01-20 ENCOUNTER — Ambulatory Visit: Payer: Medicare Other | Admitting: Physical Therapy

## 2023-01-21 ENCOUNTER — Ambulatory Visit: Payer: Medicare Other

## 2023-01-25 ENCOUNTER — Ambulatory Visit: Payer: Medicare Other | Admitting: Physical Therapy

## 2023-01-27 ENCOUNTER — Ambulatory Visit: Payer: Medicare Other | Admitting: Physical Therapy

## 2023-02-01 ENCOUNTER — Ambulatory Visit: Payer: Medicare Other

## 2023-02-03 ENCOUNTER — Ambulatory Visit: Payer: Medicare Other

## 2023-02-04 ENCOUNTER — Other Ambulatory Visit: Payer: Self-pay | Admitting: *Deleted

## 2023-02-04 ENCOUNTER — Ambulatory Visit
Admission: RE | Admit: 2023-02-04 | Discharge: 2023-02-04 | Disposition: A | Discharge status: Home / Self Care | Payer: Medicare Other | Source: Ambulatory Visit | Attending: Radiation Oncology | Admitting: Radiation Oncology

## 2023-02-04 ENCOUNTER — Encounter: Payer: Self-pay | Admitting: Radiation Oncology

## 2023-02-04 VITALS — BP 174/83 | HR 66 | Temp 97.0°F | Resp 16 | Ht 67.0 in | Wt 187.0 lb

## 2023-02-04 DIAGNOSIS — C61 Malignant neoplasm of prostate: Secondary | ICD-10-CM | POA: Diagnosis present

## 2023-02-04 NOTE — Progress Notes (Signed)
Radiation Oncology Follow up Note  Name: Brian Callahan   Date:   02/04/2023 MRN:  [id number] DOB: Mar 10, 1941    This 82 y.o. male presents to the clinic today for 1 month follow-up status post image guided IMRT radiation therapy for Gleason 7 (4+3) adenocarcinoma the prostate presenting with a PSA of 5.7.  REFERRING PROVIDER: Marguarite Arbour, MD  HPI: Patient is a 82 year old male now out 1 month having completed image guided IMRT radiation therapy to his prostate for Gleason 7 adenocarcinoma seen today in routine follow-up he is doing well he specifically denies any increased lower urinary tract symptoms has nocturia x 2..  He is currently on Flomax.  He is having some mild intermittent diarrhea.  COMPLICATIONS OF TREATMENT: none  FOLLOW UP COMPLIANCE: keeps appointments   PHYSICAL EXAM:  BP (!) 174/83   Pulse 66   Temp (!) 97 F (36.1 C)   Resp 16   Ht 5\' 7"  (1.702 m)   Wt 187 lb (84.8 kg)   BMI 29.29 kg/m  Well-developed well-nourished patient in NAD. HEENT reveals PERLA, EOMI, discs not visualized.  Oral cavity is clear. No oral mucosal lesions are identified. Neck is clear without evidence of cervical or supraclavicular adenopathy. Lungs are clear to A&P. Cardiac examination is essentially unremarkable with regular rate and rhythm without murmur rub or thrill. Abdomen is benign with no organomegaly or masses noted. Motor sensory and DTR levels are equal and symmetric in the upper and lower extremities. Cranial nerves II through XII are grossly intact. Proprioception is intact. No peripheral adenopathy or edema is identified. No motor or sensory levels are noted. Crude visual fields are within normal range.  RADIOLOGY RESULTS: No current films for review  PLAN: Present time patient is doing well 1 month out from external beam radiation therapy with low side effect profile.  I have asked to see him back in 3 months for follow-up with a PSA prior to that visit.  Patient knows to  call sooner with any concerns.  I would like to take this opportunity to thank you for allowing me to participate in the care of your patient.Carmina Miller, MD

## 2023-02-08 ENCOUNTER — Ambulatory Visit: Payer: Medicare Other

## 2023-02-15 ENCOUNTER — Encounter: Payer: Medicare Other | Admitting: Physical Therapy

## 2023-02-22 ENCOUNTER — Encounter: Payer: Medicare Other | Admitting: Physical Therapy

## 2023-02-24 ENCOUNTER — Encounter: Payer: Medicare Other | Admitting: Physical Therapy

## 2023-03-01 ENCOUNTER — Encounter: Payer: Medicare Other | Admitting: Physical Therapy

## 2023-03-03 ENCOUNTER — Encounter: Payer: Medicare Other | Admitting: Physical Therapy

## 2023-03-08 ENCOUNTER — Encounter: Payer: Medicare Other | Admitting: Physical Therapy

## 2023-03-10 ENCOUNTER — Encounter: Payer: Medicare Other | Admitting: Physical Therapy

## 2023-03-15 ENCOUNTER — Encounter: Payer: Medicare Other | Admitting: Physical Therapy

## 2023-03-17 ENCOUNTER — Encounter: Payer: Medicare Other | Admitting: Physical Therapy

## 2023-03-22 ENCOUNTER — Encounter: Payer: Medicare Other | Admitting: Physical Therapy

## 2023-03-24 ENCOUNTER — Encounter: Payer: Medicare Other | Admitting: Physical Therapy

## 2023-03-31 ENCOUNTER — Encounter: Payer: Medicare Other | Admitting: Physical Therapy

## 2023-04-05 ENCOUNTER — Encounter: Payer: Medicare Other | Admitting: Physical Therapy

## 2023-04-07 ENCOUNTER — Encounter: Payer: Medicare Other | Admitting: Physical Therapy

## 2023-04-12 ENCOUNTER — Encounter: Payer: Medicare Other | Admitting: Physical Therapy

## 2023-04-14 ENCOUNTER — Encounter: Payer: Medicare Other | Admitting: Physical Therapy

## 2023-04-19 ENCOUNTER — Encounter: Payer: Medicare Other | Admitting: Physical Therapy

## 2023-04-21 ENCOUNTER — Encounter: Payer: Medicare Other | Admitting: Physical Therapy

## 2023-04-26 ENCOUNTER — Encounter: Payer: Medicare Other | Admitting: Physical Therapy

## 2023-04-28 ENCOUNTER — Encounter: Payer: Medicare Other | Admitting: Physical Therapy

## 2023-04-29 ENCOUNTER — Inpatient Hospital Stay: Payer: Medicare Other

## 2023-05-03 ENCOUNTER — Encounter: Payer: Medicare Other | Admitting: Physical Therapy

## 2023-05-05 ENCOUNTER — Encounter: Payer: Medicare Other | Admitting: Physical Therapy

## 2023-05-06 ENCOUNTER — Encounter: Payer: Self-pay | Admitting: Radiation Oncology

## 2023-05-06 ENCOUNTER — Other Ambulatory Visit: Payer: Self-pay | Admitting: *Deleted

## 2023-05-06 ENCOUNTER — Ambulatory Visit
Admission: RE | Admit: 2023-05-06 | Discharge: 2023-05-06 | Disposition: A | Discharge status: Home / Self Care | Payer: Medicare Other | Source: Ambulatory Visit | Attending: Radiation Oncology | Admitting: Radiation Oncology

## 2023-05-06 VITALS — BP 169/76 | HR 82 | Temp 96.5°F | Wt 191.6 lb

## 2023-05-06 DIAGNOSIS — C61 Malignant neoplasm of prostate: Secondary | ICD-10-CM | POA: Diagnosis present

## 2023-05-06 DIAGNOSIS — R197 Diarrhea, unspecified: Secondary | ICD-10-CM | POA: Diagnosis not present

## 2023-05-06 DIAGNOSIS — Z923 Personal history of irradiation: Secondary | ICD-10-CM | POA: Insufficient documentation

## 2023-05-06 NOTE — Progress Notes (Signed)
Radiation Oncology Follow up Note  Name: Brian Callahan   Date:   05/06/2023 MRN:  [id number] DOB: Dec 06, 1940    This 82 y.o. male presents to the clinic today for 5-month follow-up status post IMRT radiation to his prostate for Gleason 7 adenocarcinoma.Marland Kitchen  REFERRING PROVIDER: Marguarite Arbour, MD  HPI: Patient is a 82 year old male.  Now out 4 months having completed IMRT radiation therapy for Gleason 7 (4+3) adenocarcinoma presenting with a PSA of 5.7 seen today in routine follow-up he still been having some irregular intermittent diarrhea which seems to be improving he does have nocturia times 65-year-old does take Flomax twice a day.  His most recent PSA is l 0.01  COMPLICATIONS OF TREATMENT: none  FOLLOW UP COMPLIANCE: keeps appointments   PHYSICAL EXAM:  BP (!) 169/76 (BP Location: Left Arm, Patient Position: Sitting, Cuff Size: Large)   Pulse 82   Temp (!) 96.5 F (35.8 C) (Tympanic)   Wt 191 lb 9.6 oz (86.9 kg)   BMI 30.01 kg/m  Well-developed well-nourished patient in NAD. HEENT reveals PERLA, EOMI, discs not visualized.  Oral cavity is clear. No oral mucosal lesions are identified. Neck is clear without evidence of cervical or supraclavicular adenopathy. Lungs are clear to A&P. Cardiac examination is essentially unremarkable with regular rate and rhythm without murmur rub or thrill. Abdomen is benign with no organomegaly or masses noted. Motor sensory and DTR levels are equal and symmetric in the upper and lower extremities. Cranial nerves II through XII are grossly intact. Proprioception is intact. No peripheral adenopathy or edema is identified. No motor or sensory levels are noted. Crude visual fields are within normal range.  RADIOLOGY RESULTS: No current films for review  PLAN: Present time patient is doing well side effect profile is improving.  He is under excellent biochemical control of his prostate cancer.  Of asked to see him back in 6 months with a PSA at that time.   Patient is to call with any concerns.  I would like to take this opportunity to thank you for allowing me to participate in the care of your patient.Carmina Miller, MD

## 2023-05-10 ENCOUNTER — Encounter: Payer: Medicare Other | Admitting: Physical Therapy

## 2023-05-12 ENCOUNTER — Encounter: Payer: Medicare Other | Admitting: Physical Therapy

## 2023-05-17 ENCOUNTER — Encounter: Payer: Medicare Other | Admitting: Physical Therapy

## 2023-05-19 ENCOUNTER — Encounter: Payer: Medicare Other | Admitting: Physical Therapy

## 2023-05-22 ENCOUNTER — Other Ambulatory Visit: Payer: Self-pay | Admitting: Urology

## 2023-05-22 DIAGNOSIS — N401 Enlarged prostate with lower urinary tract symptoms: Secondary | ICD-10-CM

## 2023-09-15 ENCOUNTER — Ambulatory Visit
Admission: RE | Admit: 2023-09-15 | Discharge: 2023-09-15 | Disposition: A | Discharge status: Home / Self Care | Payer: Medicare Other | Source: Ambulatory Visit | Attending: Internal Medicine | Admitting: Internal Medicine

## 2023-09-15 ENCOUNTER — Ambulatory Visit: Payer: Medicare Other | Admitting: Anesthesiology

## 2023-09-15 ENCOUNTER — Encounter
Admission: RE | Disposition: A | Discharge status: Home / Self Care | Payer: Self-pay | Source: Ambulatory Visit | Attending: Internal Medicine

## 2023-09-15 ENCOUNTER — Encounter: Payer: Self-pay | Admitting: Internal Medicine

## 2023-09-15 DIAGNOSIS — J449 Chronic obstructive pulmonary disease, unspecified: Secondary | ICD-10-CM | POA: Insufficient documentation

## 2023-09-15 DIAGNOSIS — I10 Essential (primary) hypertension: Secondary | ICD-10-CM | POA: Diagnosis not present

## 2023-09-15 DIAGNOSIS — Z87891 Personal history of nicotine dependence: Secondary | ICD-10-CM | POA: Diagnosis not present

## 2023-09-15 DIAGNOSIS — D128 Benign neoplasm of rectum: Secondary | ICD-10-CM | POA: Insufficient documentation

## 2023-09-15 DIAGNOSIS — E669 Obesity, unspecified: Secondary | ICD-10-CM | POA: Insufficient documentation

## 2023-09-15 DIAGNOSIS — K591 Functional diarrhea: Secondary | ICD-10-CM | POA: Diagnosis present

## 2023-09-15 DIAGNOSIS — K5730 Diverticulosis of large intestine without perforation or abscess without bleeding: Secondary | ICD-10-CM | POA: Insufficient documentation

## 2023-09-15 DIAGNOSIS — E119 Type 2 diabetes mellitus without complications: Secondary | ICD-10-CM | POA: Insufficient documentation

## 2023-09-15 DIAGNOSIS — K2100 Gastro-esophageal reflux disease with esophagitis, without bleeding: Secondary | ICD-10-CM | POA: Insufficient documentation

## 2023-09-15 DIAGNOSIS — Z7984 Long term (current) use of oral hypoglycemic drugs: Secondary | ICD-10-CM | POA: Insufficient documentation

## 2023-09-15 DIAGNOSIS — Z6829 Body mass index (BMI) 29.0-29.9, adult: Secondary | ICD-10-CM | POA: Diagnosis not present

## 2023-09-15 DIAGNOSIS — Z923 Personal history of irradiation: Secondary | ICD-10-CM | POA: Insufficient documentation

## 2023-09-15 DIAGNOSIS — K640 First degree hemorrhoids: Secondary | ICD-10-CM | POA: Diagnosis not present

## 2023-09-15 HISTORY — PX: FLEXIBLE SIGMOIDOSCOPY: SHX5431

## 2023-09-15 HISTORY — PX: BIOPSY: SHX5522

## 2023-09-15 HISTORY — PX: POLYPECTOMY: SHX5525

## 2023-09-15 LAB — GLUCOSE, CAPILLARY: Glucose-Capillary: 81 mg/dL (ref 70–99)

## 2023-09-15 SURGERY — SIGMOIDOSCOPY, FLEXIBLE
Anesthesia: General

## 2023-09-15 MED ORDER — SODIUM CHLORIDE 0.9 % IV SOLN
INTRAVENOUS | Status: DC
  Administered 2023-09-15: 500 mL via INTRAVENOUS

## 2023-09-15 MED ORDER — PROPOFOL 10 MG/ML IV BOLUS
INTRAVENOUS | Status: DC | PRN
  Administered 2023-09-15: 100 mg via INTRAVENOUS
  Administered 2023-09-15: 125 ug/kg/min via INTRAVENOUS

## 2023-09-15 MED ORDER — LIDOCAINE HCL (CARDIAC) PF 100 MG/5ML IV SOSY
PREFILLED_SYRINGE | INTRAVENOUS | Status: DC | PRN
  Administered 2023-09-15: 50 mg via INTRAVENOUS

## 2023-09-15 NOTE — H&P (Signed)
Outpatient short stay form Pre-procedure 09/15/2023 2:26 PM Brian Callahan K. Norma Fredrickson, M.D.  Primary Physician: Aram Beecham, M.D.  Reason for visit:  diarrhea, change in bowel habits, history of pelvic irradiation.  History of present illness:  Recent clinical data: -- 12/05/13: CSY (inpt - hematochezia) - Small TA in ascending colon. Adherent clot with underlying diverticulum in rectum. Pancolonic diverticulosis.  -- 10/14/16: EGD (indic: epigastric pain) - Reflux esophagitis at [facility]. Mild nonspecific gastritis in antrum. Normal esophagus and duodenum.  GI medications: -- Current: Omeprazole 20 mg daily. -- Prior: Omeprazole 40 mg daily.  Interval History   Brian Callahan presents to schedule a colonoscopy for the indication of:  1. History of adenomatous polyp of colon  2. Gastroesophageal reflux disease with esophagitis without hemorrhage  3. 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 No known family history of CRC, polyps, or other GI malignancy. He has a BM every day after drinking coffee. Some days, he has mild abdominal cramping and a second loose BM, which is new for him. If he does not drink coffee, he will not have an extra BM. No recent GI symptoms including fevers, chills, unexplained weight loss, appetite changes, abdominal pain, constipation, or hematochezia.     Current Facility-Administered Medications:    0.9 %  sodium chloride infusion, , Intravenous, Continuous, Tallen Schnorr, Boykin Nearing, MD, Last Rate: 20 mL/hr at 09/15/23 1421, 500 mL at 09/15/23 1421  Medications Prior to Admission  Medication Sig Dispense Refill Last Dose/Taking   acetaminophen (TYLENOL) 500 MG tablet Take 500-1,000 mg by mouth every 6 (six) hours as needed (for headache/minor pain.).   09/14/2023   enalapril (VASOTEC) 5 MG tablet Take 1 tablet (5 mg total) by mouth 2 (two) times daily.   09/14/2023   ezetimibe (ZETIA) 10 MG tablet Take 10 mg by mouth  daily.   09/14/2023   FEROSUL 325 (65 Fe) MG tablet Take 325 mg by mouth every morning.   09/14/2023   imipramine (TOFRANIL) 25 MG tablet Take 25 mg by mouth at bedtime.   09/14/2023   Melatonin 10 MG TABS Take 10 mg by mouth at bedtime as needed (sleeo).   09/14/2023   metFORMIN (GLUCOPHAGE) 500 MG tablet Take 1 tablet by mouth 2 (two) times daily.   Past Week   metoprolol succinate (TOPROL-XL) 50 MG 24 hr tablet Take 1.5 tablets by mouth daily.   09/14/2023   Misc Natural Products (TART CHERRY ADVANCED PO) Take 12 mg by mouth daily.    09/14/2023   tamsulosin (FLOMAX) 0.4 MG CAPS capsule TAKE 1 CAPSULE BY MOUTH TWICE  DAILY 200 capsule 2 09/14/2023   tildrakizumab-asmn (ILUMYA) 100 MG/ML subcutaneous injection Inject 100 mg into the skin once.   09/14/2023   clobetasol ointment (TEMOVATE) 0.05 % Apply 1 application topically daily as needed (psoriasis).  (Patient not taking: Reported on 05/06/2023)      LORazepam (ATIVAN) 0.5 MG tablet Take 0.5 mg by mouth at bedtime as needed. (Patient not taking: Reported on 05/06/2023)        Allergies  Allergen Reactions   Isosorbide Nitrate Other (See Comments)    unkn   Shellfish Allergy Other (See Comments)    gout   Trazodone And Nefazodone Diarrhea     Past Medical History:  Diagnosis Date   Anemia    pt denies 01/18/20   BP (high blood pressure) 05/23/2015   BPH with obstruction/lower urinary tract symptoms    COPD (chronic obstructive  pulmonary disease) (HCC)    Diabetes mellitus without complication (HCC)    diet controlled   Dyspnea    Encounter for long-term (current) use of other high-risk medications    GERD (gastroesophageal reflux disease)    gastritis   GI bleed    Gout    H/O hernia repair mid-90s   x2   History of elevated PSA    History of Rocky Mountain spotted fever    HLD (hyperlipidemia)    HTN (hypertension)    Hyperglycemia    Hyperuricemia    Incomplete bladder emptying    Macular degeneration    Obesity     Psoriasis    Psoriasis    Urinary frequency     Review of systems:  Otherwise negative.    Physical Exam  Gen: Alert, oriented. Appears stated age.  HEENT: [place]/AT. PERRLA. Lungs: CTA, no wheezes. CV: RR nl S1, S2. Abd: soft, benign, no masses. BS+ Ext: No edema. Pulses 2+    Planned procedures: Proceed with flexible sigmoidoscopy w/ biopsy. The patient understands the nature of the planned procedure, indications, risks, alternatives and potential complications including but not limited to bleeding, infection, perforation, damage to internal organs and possible oversedation/side effects from anesthesia. The patient agrees and gives consent to proceed.  Please refer to procedure notes for findings, recommendations and patient disposition/instructions.     Brian Callahan K. Norma Fredrickson, M.D. Gastroenterology 09/15/2023  2:26 PM

## 2023-09-15 NOTE — Anesthesia Preprocedure Evaluation (Signed)
Anesthesia Evaluation  Patient identified by MRN, date of birth, ID band Patient awake    Reviewed: Allergy & Precautions, NPO status , Patient's Chart, lab work & pertinent test results  Airway Mallampati: II  TM Distance: >3 FB Neck ROM: full    Dental  (+) Missing, Poor Dentition, Dental Advisory Given   Pulmonary neg pulmonary ROS, COPD, Patient abstained from smoking., former smoker   Pulmonary exam normal breath sounds clear to auscultation       Cardiovascular Exercise Tolerance: Good hypertension, Pt. on medications negative cardio ROS Normal cardiovascular exam Rhythm:Regular Rate:Normal     Neuro/Psych negative neurological ROS  negative psych ROS   GI/Hepatic negative GI ROS, Neg liver ROS,GERD  Medicated,,  Endo/Other  negative endocrine ROSType 2, Oral Hypoglycemic Agents    Renal/GU negative Renal ROS  negative genitourinary   Musculoskeletal   Abdominal  (+) + obese  Peds negative pediatric ROS (+)  Hematology negative hematology ROS (+) Blood dyscrasia, anemia   Anesthesia Other Findings Past Medical History: No date: Anemia     Comment:  pt denies 01/18/20 05/23/2015: BP (high blood pressure) No date: BPH with obstruction/lower urinary tract symptoms No date: COPD (chronic obstructive pulmonary disease) (HCC) No date: Diabetes mellitus without complication (HCC)     Comment:  diet controlled No date: Dyspnea No date: Encounter for long-term (current) use of other high-risk  medications No date: GERD (gastroesophageal reflux disease)     Comment:  gastritis No date: GI bleed No date: Gout mid-90s: H/O hernia repair     Comment:  x2 No date: History of elevated PSA No date: History of [place] spotted fever No date: HLD (hyperlipidemia) No date: HTN (hypertension) No date: Hyperglycemia No date: Hyperuricemia No date: Incomplete bladder emptying No date: Macular degeneration No  date: Obesity No date: Psoriasis No date: Psoriasis No date: Urinary frequency  Past Surgical History: No date: APPENDECTOMY No date: BACK SURGERY     Comment:  neck and spine 2007 2007: CERVICAL SPINE SURGERY 10/18/2017: CHOLECYSTECTOMY; N/A     Comment:  Procedure: LAPAROSCOPIC CHOLECYSTECTOMY;  Surgeon:               Carolan Shiver, MD;  Location: [facility] ORS;  Service:              General;  Laterality: N/A; 1998: COLECTOMY     Comment:  partial, due to obstruction No date: COLONOSCOPY     Comment:  lost 10 units of blood 09/27/2019: COLONOSCOPY WITH PROPOFOL; N/A     Comment:  Procedure: COLONOSCOPY WITH PROPOFOL;  Surgeon: [name],               Boykin Nearing, MD;  Location: [facility] ENDOSCOPY;  Service:               Gastroenterology;  Laterality: N/A; 01/23/2020: CYSTOSCOPY WITH INSERTION OF UROLIFT; N/A     Comment:  Procedure: CYSTOSCOPY WITH INSERTION OF UROLIFT;                Surgeon: Riki Altes, MD;  Location: [facility] ORS;                Service: Urology;  Laterality: N/A; 10/14/2016: ESOPHAGOGASTRODUODENOSCOPY; N/A     Comment:  Procedure: ESOPHAGOGASTRODUODENOSCOPY (EGD);  Surgeon:               Scot Jun, MD;  Location: [facility] ENDOSCOPY;  Service: Endoscopy;  Laterality: N/A; 09/27/2019: ESOPHAGOGASTRODUODENOSCOPY (EGD) WITH PROPOFOL; N/A     Comment:  Procedure: ESOPHAGOGASTRODUODENOSCOPY (EGD) WITH               PROPOFOL;  Surgeon: [name], Boykin Nearing, MD;  Location:               [facility] ENDOSCOPY;  Service: Gastroenterology;  Laterality:               N/A; No date: EYE SURGERY     Comment:  bilateral cataracts mid-90s: HERNIA REPAIR     Comment:  x2  inguinal 2013: PROSTATE BIOPSY No date: SURGERY SCROTAL / TESTICULAR     Comment:  for fibrous growth  BMI    Body Mass Index: 29.27 kg/m      Reproductive/Obstetrics negative OB ROS                             Anesthesia Physical Anesthesia Plan  ASA: 3  Anesthesia  Plan: General   Post-op Pain Management:    Induction: Intravenous  PONV Risk Score and Plan: Propofol infusion and TIVA  Airway Management Planned: Natural Airway  Additional Equipment:   Intra-op Plan:   Post-operative Plan:   Informed Consent: I have reviewed the patients History and Physical, chart, labs and discussed the procedure including the risks, benefits and alternatives for the proposed anesthesia with the patient or authorized representative who has indicated his/her understanding and acceptance.     Dental Advisory Given  Plan Discussed with: CRNA and Surgeon  Anesthesia Plan Comments:        Anesthesia Quick Evaluation

## 2023-09-15 NOTE — Transfer of Care (Signed)
Immediate Anesthesia Transfer of Care Note  Patient: Brian Callahan  Procedure(s) Performed: FLEXIBLE SIGMOIDOSCOPY BIOPSY POLYPECTOMY  Patient Location: Endoscopy Unit  Anesthesia Type:General  Level of Consciousness: awake, alert , and drowsy  Airway & Oxygen Therapy: Patient Spontanous Breathing  Post-op Assessment: Report given to RN and Post -op Vital signs reviewed and stable  Post vital signs: Reviewed and stable  Last Vitals:  Vitals Value Taken Time  BP 116/58 09/15/23 1529  Temp 37 C 09/15/23 1527  Pulse 77 09/15/23 1530  Resp 18 09/15/23 1530  SpO2 97 % 09/15/23 1530  Vitals shown include unfiled device data.  Last Pain:  Vitals:   09/15/23 1527  TempSrc: Temporal  PainSc: 0-No pain         Complications: No notable events documented.

## 2023-09-15 NOTE — Interval H&P Note (Signed)
History and Physical Interval Note:  09/15/2023 3:11 PM  Brian Callahan  has presented today for surgery, with the diagnosis of R19.4 (ICD-10-CM) - Change in bowel habits K52.9 (ICD-10-CM) - Chronic diarrhea R15.2 (ICD-10-CM) - Fecal urgency R19.8 (ICD-10-CM) - Tenesmus (rectal) Z92.3 (ICD-10-CM) - Hx of radiation therapy.  The various methods of treatment have been discussed with the patient and family. After consideration of risks, benefits and other options for treatment, the patient has consented to  Procedure(s) with comments: FLEXIBLE SIGMOIDOSCOPY (N/A) - DM as a surgical intervention.  The patient's history has been reviewed, patient examined, no change in status, stable for surgery.  I have reviewed the patient's chart and labs.  Questions were answered to the patient's satisfaction.     Turner, Verdi

## 2023-09-15 NOTE — Op Note (Signed)
[facility]enter[facility] Patient Name: Brian Callahan Procedure Date: 09/15/2023 3:08 PM MRN: [id number] Account #: [id number] Date of Birth: Dec 23, 1940 Admit Type: Outpatient Age: 83 Room: [facility] ENDO ROOM 2 Gender: Male Note Status: Finalized Instrument Name: Peds Colonoscope [id number] Procedure:             Flexible Sigmoidoscopy Indications:           Functional diarrhea Providers:             Royce Macadamia K. Norma Fredrickson MD, MD Referring MD:          Duane Lope. Judithann Sheen, MD (Referring MD) Medicines:             Propofol per Anesthesia Complications:         No immediate complications. Estimated blood loss:                         Minimal. Procedure:             Pre-Anesthesia Assessment:                        - The risks and benefits of the procedure and the                         sedation options and risks were discussed with the                         patient. All questions were answered and informed                         consent was obtained.                        - Patient identification and proposed procedure were                         verified prior to the procedure by the nurse. The                         procedure was verified in the procedure room.                        - ASA Grade Assessment: III - A patient with severe                         systemic disease.                        - After reviewing the risks and benefits, the patient                         was deemed in satisfactory condition to undergo the                         procedure.                        After obtaining informed consent, the scope was passed                         under direct vision.  The Colonoscope was introduced                         through the anus and advanced to the the splenic                         flexure. The flexible sigmoidoscopy was accomplished                         without difficulty. The patient tolerated the                         procedure well.  The quality of the bowel preparation                         was good. Findings:      The perianal and digital rectal examinations were normal. Pertinent       negatives include normal sphincter tone and no palpable rectal lesions.      Non-bleeding internal hemorrhoids were found during retroflexion. The       hemorrhoids were Grade I (internal hemorrhoids that do not prolapse).      A 12 mm polyp was found in the distal rectum. The polyp was       pedunculated. The polyp was removed with a hot snare. Resection and       retrieval were complete.      Biopsies for histology were taken with a cold forceps from the random       colon for evaluation of microscopic colitis.      Many small-mouthed diverticula were found in the sigmoid colon.      The exam was otherwise without abnormality. Impression:            - Non-bleeding internal hemorrhoids.                        - One 12 mm polyp in the distal rectum, removed with a                         hot snare. Resected and retrieved.                        - Diverticulosis in the sigmoid colon.                        - The examination was otherwise normal.                        - Biopsies were taken with a cold forceps from the                         random colon for evaluation of microscopic colitis. Recommendation:        - Patient has a contact number available for                         emergencies. The signs and symptoms of potential                         delayed complications were discussed with the patient.  Return to normal activities tomorrow. Written                         discharge instructions were provided to the patient.                        - Resume previous diet.                        - Continue present medications.                        - Await pathology results.                        - Follow up with Jacob Moores, PA-C in the GI office.                         [phone]                         - Telephone GI office to schedule appointment in 2                         months.                        - The findings and recommendations were discussed with                         the patient.                        - If polyps are benign or adenomatous without                         dysplasia, I will advise NO further colonoscopy due to                         advanced age and/or severe comorbidity. Procedure Code(s):     --- Professional ---                        [phone], Sigmoidoscopy, flexible; with removal of                         tumor(s), polyp(s), or other lesion(s) by snare                         technique                        45331, 59, Sigmoidoscopy, flexible; with biopsy,                         single or multiple Diagnosis Code(s):     --- Professional ---                        K57.30, Diverticulosis of large intestine without                         perforation or  abscess without bleeding                        K59.1, Functional diarrhea                        K64.0, First degree hemorrhoids                        D12.8, Benign neoplasm of rectum CPT copyright 2022 [employer]. All rights reserved. The codes documented in this report are preliminary and upon coder review may  be revised to meet current compliance requirements. Stanton Kidney MD, MD 09/15/2023 3:30:09 PM This report has been signed electronically. Number of Addenda: 0 Note Initiated On: 09/15/2023 3:08 PM Total Procedure Duration: 0 hours 5 minutes 11 seconds  Estimated Blood Loss:  Estimated blood loss was minimal. Estimated blood loss                         was minimal. Estimated blood loss was minimal.      [facility]

## 2023-09-15 NOTE — Anesthesia Procedure Notes (Signed)
Date/Time: 09/15/2023 3:10 PM  Performed by: Ginger Carne, CRNAPre-anesthesia Checklist: Patient identified, Emergency Drugs available, Suction available, Patient being monitored and Timeout performed Patient Re-evaluated:Patient Re-evaluated prior to induction Oxygen Delivery Method: Nasal cannula Preoxygenation: Pre-oxygenation with 100% oxygen Induction Type: IV induction

## 2023-09-16 ENCOUNTER — Encounter: Payer: Self-pay | Admitting: Internal Medicine

## 2023-09-16 LAB — SURGICAL PATHOLOGY

## 2023-09-17 NOTE — Anesthesia Postprocedure Evaluation (Signed)
Anesthesia Post Note  Patient: Brian Callahan  Procedure(s) Performed: FLEXIBLE SIGMOIDOSCOPY BIOPSY POLYPECTOMY  Patient location during evaluation: Endoscopy Anesthesia Type: General Level of consciousness: awake and alert Pain management: pain level controlled Vital Signs Assessment: post-procedure vital signs reviewed and stable Respiratory status: spontaneous breathing, nonlabored ventilation, respiratory function stable and patient connected to nasal cannula oxygen Cardiovascular status: blood pressure returned to baseline and stable Postop Assessment: no apparent nausea or vomiting Anesthetic complications: no   No notable events documented.   Last Vitals:  Vitals:   09/15/23 1537 09/15/23 1547  BP: 133/65 (!) 157/78  Pulse:    Resp:    Temp:    SpO2:      Last Pain:  Vitals:   09/15/23 1547  TempSrc:   PainSc: 0-No pain                 Lenard Simmer

## 2023-10-28 ENCOUNTER — Inpatient Hospital Stay: Payer: Medicare Other | Attending: Radiation Oncology

## 2023-10-28 DIAGNOSIS — C61 Malignant neoplasm of prostate: Secondary | ICD-10-CM | POA: Diagnosis present

## 2023-10-28 LAB — CBC (CANCER CENTER ONLY)
HCT: 36.5 % — ABNORMAL LOW (ref 39.0–52.0)
Hemoglobin: 12.1 g/dL — ABNORMAL LOW (ref 13.0–17.0)
MCH: 33.8 pg (ref 26.0–34.0)
MCHC: 33.2 g/dL (ref 30.0–36.0)
MCV: 102 fL — ABNORMAL HIGH (ref 80.0–100.0)
Platelet Count: 163 10*3/uL (ref 150–400)
RBC: 3.58 MIL/uL — ABNORMAL LOW (ref 4.22–5.81)
RDW: 14.1 % (ref 11.5–15.5)
WBC Count: 3.4 10*3/uL — ABNORMAL LOW (ref 4.0–10.5)
nRBC: 0 % (ref 0.0–0.2)

## 2023-10-28 LAB — PSA: Prostatic Specific Antigen: <0.01 ng/mL (ref 0.00–4.00)

## 2023-11-04 ENCOUNTER — Ambulatory Visit
Admission: RE | Admit: 2023-11-04 | Discharge: 2023-11-04 | Disposition: A | Discharge status: Home / Self Care | Payer: Medicare Other | Source: Ambulatory Visit | Attending: Radiation Oncology | Admitting: Radiation Oncology

## 2023-11-04 ENCOUNTER — Encounter: Payer: Self-pay | Admitting: Radiation Oncology

## 2023-11-04 VITALS — BP 137/73 | HR 65 | Temp 97.0°F | Resp 16 | Ht 68.0 in

## 2023-11-04 DIAGNOSIS — Z923 Personal history of irradiation: Secondary | ICD-10-CM | POA: Insufficient documentation

## 2023-11-04 DIAGNOSIS — C61 Malignant neoplasm of prostate: Secondary | ICD-10-CM | POA: Diagnosis present

## 2023-11-04 NOTE — Progress Notes (Signed)
 Radiation Oncology Follow up Note  Name: Brian Callahan   Date:   11/04/2023 MRN:  [id number] DOB: 08-25-1940    This 83 y.o. 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  REFERRING PROVIDER: Marguarite Arbour, MD  HPI: Patient is an 83 year old male now out 10 months having completed image guided IMRT radiation therapy to his prostate for Gleason 7 (4+3) presenting with a PSA of 5.7 seen today in routine follow-up he is doing fairly well.  He has been put on some fiber which seems to help his chronic diarrhea which he says is not that big a problem anymore.  Really not following any low residue diet.  He does take Flomax twice a day which is helping his urinary symptoms.  His most recent PSA is less than 0.01.Marland Kitchen  COMPLICATIONS OF TREATMENT: none  FOLLOW UP COMPLIANCE: keeps appointments   PHYSICAL EXAM:  BP 137/73   Pulse 65   Temp (!) 97 F (36.1 C)   Resp 16   Ht 5\' 8"  (1.727 m)   BMI 29.27 kg/m  Well-developed well-nourished patient in NAD. HEENT reveals PERLA, EOMI, discs not visualized.  Oral cavity is clear. No oral mucosal lesions are identified. Neck is clear without evidence of cervical or supraclavicular adenopathy. Lungs are clear to A&P. Cardiac examination is essentially unremarkable with regular rate and rhythm without murmur rub or thrill. Abdomen is benign with no organomegaly or masses noted. Motor sensory and DTR levels are equal and symmetric in the upper and lower extremities. Cranial nerves II through XII are grossly intact. Proprioception is intact. No peripheral adenopathy or edema is identified. No motor or sensory levels are noted. Crude visual fields are within normal range.  RADIOLOGY RESULTS: No current films for review  PLAN: The present time patient is doing well under excellent biochemical control of his prostate cancer.  And pleased with his overall  progress.  He continues on Flomax.  Of asked to see him back in 6 months with a PSA at that time.  Should that be a similar result we will start once year follow-up visits.  Patient knows to call at anytime with any concerns.  I would like to take this opportunity to thank you for allowing me to participate in the care of your patient.Carmina Miller, MD

## 2023-11-26 ENCOUNTER — Other Ambulatory Visit: Payer: Self-pay | Admitting: Surgery

## 2023-12-03 ENCOUNTER — Encounter (INDEPENDENT_AMBULATORY_CARE_PROVIDER_SITE_OTHER): Payer: Self-pay | Admitting: Nurse Practitioner

## 2023-12-03 ENCOUNTER — Ambulatory Visit (INDEPENDENT_AMBULATORY_CARE_PROVIDER_SITE_OTHER): Payer: Self-pay | Admitting: Nurse Practitioner

## 2023-12-03 VITALS — BP 150/81 | HR 65 | Resp 16 | Ht 67.0 in | Wt 194.0 lb

## 2023-12-03 DIAGNOSIS — E118 Type 2 diabetes mellitus with unspecified complications: Secondary | ICD-10-CM | POA: Diagnosis not present

## 2023-12-03 DIAGNOSIS — I10 Essential (primary) hypertension: Secondary | ICD-10-CM

## 2023-12-03 DIAGNOSIS — K922 Gastrointestinal hemorrhage, unspecified: Secondary | ICD-10-CM | POA: Diagnosis not present

## 2023-12-05 ENCOUNTER — Encounter (INDEPENDENT_AMBULATORY_CARE_PROVIDER_SITE_OTHER): Payer: Self-pay | Admitting: Nurse Practitioner

## 2023-12-05 NOTE — Progress Notes (Signed)
 Subjective:    Patient ID: Brian Callahan, male    DOB: 1940/12/09, 83 y.o.   MRN: [id number] Chief Complaint  Patient presents with   New Patient (Initial Visit)    Ref Poggi consult IVC filter    The patient is an 83 year old male who presents today for evaluation for placement of an IVC filter.  He has not had any previous history of DVT or pulmonary embolism but he has had significant history of GI bleeds.  Based on this he is not a candidate for anticoagulation.  There is a concern that post hip surgery he may have developed DVT and if so he would not be able to undergo anticoagulation.  Initially the surgery was scheduled for 12/16/2023 but the patient needs to have some dental work done first.    Review of Systems  All other systems reviewed and are negative.      Objective:    Physical Exam Vitals reviewed.  HENT:     Head: Normocephalic.  Cardiovascular:     Rate and Rhythm: Normal rate.     Pulses: Normal pulses.  Pulmonary:     Effort: Pulmonary effort is normal.  Skin:    General: Skin is warm and dry.  Neurological:     Mental Status: He is alert and oriented to person, place, and time.  Psychiatric:        Mood and Affect: Mood normal.        Behavior: Behavior normal.        Thought Content: Thought content normal.        Judgment: Judgment normal.     BP (!) 150/81   Pulse 65   Resp 16   Ht 5\' 7"  (1.702 m)   Wt 194 lb (88 kg)   BMI 30.38 kg/m   Past Medical History:  Diagnosis Date   Anemia    pt denies 01/18/20   BP (high blood pressure) 05/23/2015   BPH with obstruction/lower urinary tract symptoms    COPD (chronic obstructive pulmonary disease) (HCC)    Diabetes mellitus without complication (HCC)    diet controlled   Dyspnea    Encounter for long-term (current) use of other high-risk medications    GERD (gastroesophageal reflux disease)    gastritis   GI bleed    Gout    H/O hernia repair mid-90s   x2   History of elevated PSA     History of [place] spotted fever    HLD (hyperlipidemia)    HTN (hypertension)    Hyperglycemia    Hyperuricemia    Incomplete bladder emptying    Macular degeneration    Obesity    Psoriasis    Psoriasis    Urinary frequency     Social History   Socioeconomic History   Marital status: Married    Spouse name: Not on file   Number of children: Not on file   Years of education: Not on file   Highest education level: Not on file  Occupational History   Not on file  Tobacco Use   Smoking status: Former    Current packs/day: 0.00    Average packs/day: 0.3 packs/day for 10.0 years (2.5 ttl pk-yrs)    Types: Cigarettes    Start date: 10/11/1980    Quit date: 10/12/1990    Years since quitting: 33.1    Passive exposure: Past   Smokeless tobacco: Never   Tobacco comments:    Quit 20 years ago  Vaping Use   Vaping status: Never Used  Substance and Sexual Activity   Alcohol use: Yes    Alcohol/week: 18.0 standard drinks of alcohol    Types: 18 Cans of beer per week    Comment: occassional   Drug use: No   Sexual activity: Not on file  Other Topics Concern   Not on file  Social History Narrative   Not on file   Social Drivers of Health   Financial Resource Strain: Low Risk  (09/02/2023)   Received from [facility]   Overall Financial Resource Strain (CARDIA)    Difficulty of Paying Living Expenses: Not hard at all  Food Insecurity: No Food Insecurity (09/02/2023)   Received from [facility]   Hunger Vital Sign    Worried About Running Out of Food in the Last Year: Never true    Ran Out of Food in the Last Year: Never true  Transportation Needs: No Transportation Needs (09/02/2023)   Received from [facility]    In the past 12 months, has lack of transportation kept you from medical appointments or from getting medications?: No    Lack of Transportation (Non-Medical): No  Physical  Activity: Not on file  Stress: Not on file  Social Connections: Not on file  Intimate Partner Violence: Not on file    Past Surgical History:  Procedure Laterality Date   APPENDECTOMY     BACK SURGERY     neck and spine 2007   BIOPSY  09/15/2023   Procedure: BIOPSY;  Surgeon: Corky Diener, Alphonsus Jeans, MD;  Location: [facility] ENDOSCOPY;  Service: Gastroenterology;;   CERVICAL SPINE SURGERY  2007   CHOLECYSTECTOMY N/A 10/18/2017   Procedure: LAPAROSCOPIC CHOLECYSTECTOMY;  Surgeon: Eldred Grego, MD;  Location: [facility] ORS;  Service: General;  Laterality: N/A;   COLECTOMY  1998   partial, due to obstruction   COLONOSCOPY     lost 10 units of blood   COLONOSCOPY WITH PROPOFOL  N/A 09/27/2019   Procedure: COLONOSCOPY WITH PROPOFOL ;  Surgeon: [name], Alphonsus Jeans, MD;  Location: [facility] ENDOSCOPY;  Service: Gastroenterology;  Laterality: N/A;   CYSTOSCOPY WITH INSERTION OF UROLIFT N/A 01/23/2020   Procedure: CYSTOSCOPY WITH INSERTION OF UROLIFT;  Surgeon: Geraline Knapp, MD;  Location: [facility] ORS;  Service: Urology;  Laterality: N/A;   ESOPHAGOGASTRODUODENOSCOPY N/A 10/14/2016   Procedure: ESOPHAGOGASTRODUODENOSCOPY (EGD);  Surgeon: Cassie Click, MD;  Location: [facility] ENDOSCOPY;  Service: Endoscopy;  Laterality: N/A;   ESOPHAGOGASTRODUODENOSCOPY (EGD) WITH PROPOFOL  N/A 09/27/2019   Procedure: ESOPHAGOGASTRODUODENOSCOPY (EGD) WITH PROPOFOL ;  Surgeon: [name], Alphonsus Jeans, MD;  Location: [facility] ENDOSCOPY;  Service: Gastroenterology;  Laterality: N/A;   EYE SURGERY     bilateral cataracts   FLEXIBLE SIGMOIDOSCOPY N/A 09/15/2023   Procedure: FLEXIBLE SIGMOIDOSCOPY;  Surgeon: [name], Alphonsus Jeans, MD;  Location: [facility] ENDOSCOPY;  Service: Gastroenterology;  Laterality: N/A;  DM   HERNIA REPAIR  mid-90s   x2  inguinal   POLYPECTOMY  09/15/2023   Procedure: POLYPECTOMY;  Surgeon: [name], Alphonsus Jeans, MD;  Location: [facility] ENDOSCOPY;  Service: Gastroenterology;;   PROSTATE BIOPSY  2013   SURGERY SCROTAL / TESTICULAR     for  fibrous growth    Family History  Problem Relation Age of Onset   Kidney failure Brother    Prostate cancer Neg Hx     Allergies  Allergen Reactions   Isosorbide Nitrate Other (See Comments)    unkn   Shellfish Allergy Other (See  Comments)    gout   Trazodone And Nefazodone Diarrhea       Latest Ref Rng & Units 10/28/2023   10:48 AM 12/18/2022    1:13 PM 12/14/2022   12:42 PM  CBC  WBC 4.0 - 10.5 K/uL 3.4  5.4  5.9   Hemoglobin 13.0 - 17.0 g/dL 16.1  09.6  04.5   Hematocrit 39.0 - 52.0 % 36.5  37.2  37.6   Platelets 150 - 400 K/uL 163  227  233        CMP     Component Value Date/Time   NA 137 12/18/2022 1313   NA 141 12/02/2013 0404   K 4.1 12/18/2022 1313   K 4.0 12/02/2013 0404   CL 100 12/18/2022 1313   CL 110 (H) 12/02/2013 0404   CO2 27 12/18/2022 1313   CO2 28 12/02/2013 0404   GLUCOSE 156 (H) 12/18/2022 1313   GLUCOSE 172 (H) 12/02/2013 0404   BUN 14 12/18/2022 1313   BUN 12 12/02/2013 0404   CREATININE 0.77 12/18/2022 1313   CREATININE 0.88 12/02/2013 0404   CALCIUM 9.7 12/18/2022 1313   CALCIUM 7.9 (L) 12/02/2013 0404   PROT 7.0 12/18/2022 1313   PROT 7.3 12/01/2013 2158   ALBUMIN 3.8 12/18/2022 1313   ALBUMIN 3.6 12/01/2013 2158   AST 29 12/18/2022 1313   AST 25 12/01/2013 2158   ALT 25 12/18/2022 1313   ALT 21 12/01/2013 2158   ALKPHOS 47 12/18/2022 1313   ALKPHOS 64 12/01/2013 2158   BILITOT 0.5 12/18/2022 1313   BILITOT 0.2 12/01/2013 2158   GFRNONAA >60 12/18/2022 1313   GFRNONAA >60 12/02/2013 0404     No results found.     Assessment & Plan:   1. Gastrointestinal hemorrhage, unspecified gastrointestinal hemorrhage type (Primary) The patient has a significant history of of GI bleed and would not be able to undergo anticoagulation if he were to develop a DVT postsurgery.  Based on this and discussion with patient and his surgeon, it was felt to prophylactically place an IVC filter.  IVC filter placement will be done the week for  surgery. Risk and benefits were reviewed the patient.  Indications for the procedure were reviewed.  All questions were answered, the patient agrees to proceed.   There was also discussed that the purpose of the IVC filter is not necessary to prevent lower extremity DVT but to prevent the progression to a pulmonary embolism.  This was understand by the patient and his wife.  The patient will follow-up with me 2-3 months after the joint replacement surgery to discuss removal (this was also discussed today and the patient agrees with the plan to have the filter removed).   2. Primary hypertension Continue antihypertensive medications as already ordered, these medications have been reviewed and there are no changes at this time.  3. Diabetes mellitus type 2 with complications (HCC) Continue hypoglycemic medications as already ordered, these medications have been reviewed and there are no changes at this time.  Hgb A1C to be monitored as already arranged by primary service   Current Outpatient Medications on File Prior to Visit  Medication Sig Dispense Refill   acetaminophen  (TYLENOL ) 500 MG tablet Take 500-1,000 mg by mouth every 6 (six) hours as needed (for headache/minor pain.).     enalapril (VASOTEC) 5 MG tablet Take 1 tablet (5 mg total) by mouth 2 (two) times daily.     ezetimibe (ZETIA) 10 MG tablet Take 10 mg by  mouth daily.     FEROSUL 325 (65 Fe) MG tablet Take 325 mg by mouth every morning.     imipramine (TOFRANIL) 25 MG tablet Take 25 mg by mouth at bedtime.     Melatonin 10 MG TABS Take 10 mg by mouth at bedtime as needed (sleeo).     metFORMIN (GLUCOPHAGE) 500 MG tablet Take 1 tablet by mouth 2 (two) times daily.     metoprolol succinate (TOPROL-XL) 50 MG 24 hr tablet Take 1.5 tablets by mouth daily.     Misc Natural Products (TART CHERRY ADVANCED PO) Take 12 mg by mouth daily.      tamsulosin  (FLOMAX ) 0.4 MG CAPS capsule TAKE 1 CAPSULE BY MOUTH TWICE  DAILY 200 capsule 2    tildrakizumab-asmn (ILUMYA) 100 MG/ML subcutaneous injection Inject 100 mg into the skin once.     clobetasol ointment (TEMOVATE) 0.05 % Apply 1 application topically daily as needed (psoriasis).  (Patient not taking: Reported on 05/06/2023)     LORazepam (ATIVAN) 0.5 MG tablet Take 0.5 mg by mouth at bedtime as needed. (Patient not taking: Reported on 12/03/2023)     No current facility-administered medications on file prior to visit.    There are no Patient Instructions on file for this visit. No follow-ups on file.   Elmus Mathes E Shondell Fabel, NP

## 2023-12-06 ENCOUNTER — Other Ambulatory Visit

## 2023-12-06 ENCOUNTER — Telehealth: Payer: Self-pay | Admitting: Cardiovascular Disease

## 2023-12-06 NOTE — Telephone Encounter (Signed)
   Pre-operative Risk Assessment    Patient Name: Brian Callahan  DOB: [birth year] MRN: [id number]   Date of last office visit: unknown Date of next office visit: unknown  Request for Surgical Clearance    Procedure:  Dental Extraction - Amount of Teeth to be Pulled:  15 plus alveoloplasty on all 4 quadrants of the mouth  Date of Surgery:  Clearance TBD                                Surgeon:  Dr. Glenna Lango Surgeon's Group or Practice Name:  [facility] office-Based Anesthesia Phone number:  [phone] Fax number:  [phone]    Type of Clearance Requested:   - Medical    Type of Anesthesia:  IV   Additional requests/questions:    Signed, Geroldine Kotyk   12/06/2023, 11:16 AM

## 2023-12-07 NOTE — Telephone Encounter (Signed)
 Pt has been scheduled to see Odessa Bene, FNP 12/10/23 @ 10:55 in the [place] office. I will update all parties involved pt has appt for preop clearance.

## 2023-12-07 NOTE — Telephone Encounter (Signed)
   Name: DORLAN BRAMLETTE  DOB: [birth year]  MRN: [id number]  Primary Cardiologist: Belva Boyden, MD  Chart reviewed as part of pre-operative protocol coverage. Because of [facility] past medical history and time since last visit, he will require a follow-up in-office visit in order to better assess preoperative cardiovascular risk.  Pre-op covering staff: - Please schedule appointment and call patient to inform them. If patient already had an upcoming appointment within acceptable timeframe, please add "pre-op clearance" to the appointment notes so provider is aware. - Please contact requesting surgeon's office via preferred method (i.e, phone, fax) to inform them of need for appointment prior to surgery.  Morey Ar, NP  12/07/2023, 11:21 AM

## 2023-12-10 ENCOUNTER — Ambulatory Visit: Attending: Nurse Practitioner | Admitting: Nurse Practitioner

## 2023-12-10 ENCOUNTER — Encounter: Payer: Self-pay | Admitting: Nurse Practitioner

## 2023-12-10 VITALS — BP 106/60 | HR 61 | Ht 68.0 in | Wt 190.0 lb

## 2023-12-10 DIAGNOSIS — I351 Nonrheumatic aortic (valve) insufficiency: Secondary | ICD-10-CM

## 2023-12-10 DIAGNOSIS — I10 Essential (primary) hypertension: Secondary | ICD-10-CM

## 2023-12-10 DIAGNOSIS — Z01810 Encounter for preprocedural cardiovascular examination: Secondary | ICD-10-CM

## 2023-12-10 DIAGNOSIS — E782 Mixed hyperlipidemia: Secondary | ICD-10-CM | POA: Diagnosis not present

## 2023-12-10 DIAGNOSIS — F1090 Alcohol use, unspecified, uncomplicated: Secondary | ICD-10-CM

## 2023-12-10 DIAGNOSIS — E118 Type 2 diabetes mellitus with unspecified complications: Secondary | ICD-10-CM

## 2023-12-10 NOTE — Telephone Encounter (Signed)
 Pt has appt today for preop clearance with Odessa Bene, NP.

## 2023-12-10 NOTE — Progress Notes (Signed)
 Cardiology Clinic Note   Patient Name: Brian Callahan Date of Encounter: 12/10/2023  Primary Care Provider:  Yehuda Helms, MD Primary Cardiologist:  Belva Boyden, MD  Patient Profile    83 y.o. male with a history of remote tobacco abuse, chest pain, normal coronary arteries on catheterization in 2010, aortic insufficiency, gastritis, GI bleed requiring hospitalization, mesenteric artery embolization, hyperlipidemia, alcohol use, prostate cancer status post radiation, diabetes, hypertension, and COPD, who presents for preoperative evaluation pending tooth extractions.  Past Medical History    Past Medical History:  Diagnosis Date   Anemia    pt denies 01/18/20   Aortic insufficiency    a. 06/2017 Echo: EF 50% with mild LVH, nl RV function, moderate AI, and mild MR/TR/PR.   BPH with obstruction/lower urinary tract symptoms    Chest pain    a. 2010 Cath ([facility]): reportedly nl; b. 06/2017 [facility]): EF 63%, mild inf wall ischemia->med rx.   COPD (chronic obstructive pulmonary disease) (HCC)    Diabetes mellitus without complication (HCC)    diet controlled   Dyspnea    Encounter for long-term (current) use of other high-risk medications    GERD (gastroesophageal reflux disease)    gastritis   GI bleed    Gout    H/O hernia repair mid-90s   x2   History of elevated PSA    History of [place] spotted fever    HLD (hyperlipidemia)    HTN (hypertension)    Hyperglycemia    Hyperuricemia    Incomplete bladder emptying    Macular degeneration    Obesity    Psoriasis    Psoriasis    Urinary frequency    Past Surgical History:  Procedure Laterality Date   APPENDECTOMY     BACK SURGERY     neck and spine 2007   BIOPSY  09/15/2023   Procedure: BIOPSY;  Surgeon: Corky Diener, Alphonsus Jeans, MD;  Location: [facility] ENDOSCOPY;  Service: Gastroenterology;;   CERVICAL SPINE SURGERY  2007   CHOLECYSTECTOMY N/A 10/18/2017   Procedure: LAPAROSCOPIC CHOLECYSTECTOMY;  Surgeon:  Eldred Grego, MD;  Location: [facility] ORS;  Service: General;  Laterality: N/A;   COLECTOMY  1998   partial, due to obstruction   COLONOSCOPY     lost 10 units of blood   COLONOSCOPY WITH PROPOFOL  N/A 09/27/2019   Procedure: COLONOSCOPY WITH PROPOFOL ;  Surgeon: [name], Alphonsus Jeans, MD;  Location: [facility] ENDOSCOPY;  Service: Gastroenterology;  Laterality: N/A;   CYSTOSCOPY WITH INSERTION OF UROLIFT N/A 01/23/2020   Procedure: CYSTOSCOPY WITH INSERTION OF UROLIFT;  Surgeon: Geraline Knapp, MD;  Location: [facility] ORS;  Service: Urology;  Laterality: N/A;   ESOPHAGOGASTRODUODENOSCOPY N/A 10/14/2016   Procedure: ESOPHAGOGASTRODUODENOSCOPY (EGD);  Surgeon: Cassie Click, MD;  Location: [facility] ENDOSCOPY;  Service: Endoscopy;  Laterality: N/A;   ESOPHAGOGASTRODUODENOSCOPY (EGD) WITH PROPOFOL  N/A 09/27/2019   Procedure: ESOPHAGOGASTRODUODENOSCOPY (EGD) WITH PROPOFOL ;  Surgeon: [name], Alphonsus Jeans, MD;  Location: [facility] ENDOSCOPY;  Service: Gastroenterology;  Laterality: N/A;   EYE SURGERY     bilateral cataracts   FLEXIBLE SIGMOIDOSCOPY N/A 09/15/2023   Procedure: FLEXIBLE SIGMOIDOSCOPY;  Surgeon: [name], Alphonsus Jeans, MD;  Location: [facility] ENDOSCOPY;  Service: Gastroenterology;  Laterality: N/A;  DM   HERNIA REPAIR  mid-90s   x2  inguinal   POLYPECTOMY  09/15/2023   Procedure: POLYPECTOMY;  Surgeon: [name], Alphonsus Jeans, MD;  Location: [facility] ENDOSCOPY;  Service: Gastroenterology;;   PROSTATE BIOPSY  2013   SURGERY SCROTAL / TESTICULAR  for fibrous growth    Allergies  Allergies  Allergen Reactions   Isosorbide Nitrate Other (See Comments)    unkn   Shellfish Allergy Other (See Comments)    gout   Trazodone And Nefazodone Diarrhea    History of Present Illness      83 y.o. y/o male with a history of remote tobacco abuse, chest pain, normal coronary arteries on catheterization in 2010, aortic insufficiency, gastritis, GI bleed requiring hospitalization, mesenteric artery embolization, hyperlipidemia,  alcohol use, prostate cancer status post radiation, diabetes, hypertension, and COPD.  As noted, he has a prior history of chest pain with reportedly abnormal stress test and diagnostic catheterization in 2010 at [facility] showing normal coronary arteries.  Echocardiogram in December 2018 showed an EF of 50% with mild LVH, normal RV function, moderate AI, and mild MR/TR/PR.  He underwent stress testing in December 2018 that showed an EF of 63% with mild inferior wall ischemia which was managed conservatively.   Mr. Ramus was last seen in cardiology clinic in April 2022, at which time he was doing well.  He was subsequently diagnosed with prostate cancer and has undergone IMRT radiation.  Over the past several years, he has had decline in functional capacity and has been seen by physical therapy as well as Ortho due to progressive left hip pain and advanced degenerative joint disease.  He is now pending total hip arthroplasty but first will require extractions of 15 teeth.  From a cardiac standpoint, he has chronic, stable dyspnea on exertion.  He notes limitation is more limited by left hip pain than anything.  He notes that he walks up stairs frequently without experiencing chest pain or dyspnea and is capable of performing moderate housework.  He denies palpitations, PND, orthopnea, dizziness, syncope, or early satiety.  He has psoriasis and chronic reddening of his bilateral lower legs with chronic mild lower extremity swelling. Objective   Home Medications    Current Outpatient Medications  Medication Sig Dispense Refill   acetaminophen  (TYLENOL ) 500 MG tablet Take 500-1,000 mg by mouth every 6 (six) hours as needed (for headache/minor pain.).     clobetasol ointment (TEMOVATE) 0.05 % Apply 1 application  topically daily as needed (psoriasis).     enalapril (VASOTEC) 5 MG tablet Take 1 tablet (5 mg total) by mouth 2 (two) times daily.     ezetimibe (ZETIA) 10 MG tablet Take 10 mg by mouth daily.      FEROSUL 325 (65 Fe) MG tablet Take 325 mg by mouth every morning.     imipramine (TOFRANIL) 25 MG tablet Take 25 mg by mouth at bedtime.     LORazepam (ATIVAN) 0.5 MG tablet Take 0.5 mg by mouth at bedtime as needed.     Melatonin 10 MG TABS Take 10 mg by mouth at bedtime as needed (sleeo).     metFORMIN (GLUCOPHAGE) 500 MG tablet Take 1 tablet by mouth 2 (two) times daily.     metoprolol succinate (TOPROL-XL) 50 MG 24 hr tablet Take 1.5 tablets by mouth daily.     Misc Natural Products (TART CHERRY ADVANCED PO) Take 12 mg by mouth daily.      tamsulosin  (FLOMAX ) 0.4 MG CAPS capsule TAKE 1 CAPSULE BY MOUTH TWICE  DAILY 200 capsule 2   tildrakizumab-asmn (ILUMYA) 100 MG/ML subcutaneous injection Inject 100 mg into the skin once.     No current facility-administered medications for this visit.     Family History    Family History  Problem Relation Age of Onset   Kidney failure Brother    Prostate cancer Neg Hx    He indicated that his mother is deceased. He indicated that his father is deceased. He indicated that the status of his brother is unknown. He indicated that the status of his neg hx is unknown.   Social History    Social History   Socioeconomic History   Marital status: Married    Spouse name: Not on file   Number of children: Not on file   Years of education: Not on file   Highest education level: Not on file  Occupational History   Not on file  Tobacco Use   Smoking status: Former    Current packs/day: 0.00    Average packs/day: 0.3 packs/day for 10.0 years (2.5 ttl pk-yrs)    Types: Cigarettes    Start date: 10/11/1980    Quit date: 10/12/1990    Years since quitting: 33.1    Passive exposure: Past   Smokeless tobacco: Never   Tobacco comments:    Quit 20 years ago  Vaping Use   Vaping status: Never Used  Substance and Sexual Activity   Alcohol use: Not Currently    Alcohol/week: 14.0 standard drinks of alcohol    Types: 14 Shots of liquor per week     Comment: 2 whiskeys per night to help him sleep   Drug use: No   Sexual activity: Not on file  Other Topics Concern   Not on file  Social History Narrative   Lives locally with wife.  Does not routinely exercise.   Social Drivers of Corporate investment banker Strain: Low Risk  (09/02/2023)   Received from [facility]   Overall Financial Resource Strain (CARDIA)    Difficulty of Paying Living Expenses: Not hard at all  Food Insecurity: No Food Insecurity (09/02/2023)   Received from [facility]   Hunger Vital Sign    Worried About Running Out of Food in the Last Year: Never true    Ran Out of Food in the Last Year: Never true  Transportation Needs: No Transportation Needs (09/02/2023)   Received from [facility]    In the past 12 months, has lack of transportation kept you from medical appointments or from getting medications?: No    Lack of Transportation (Non-Medical): No  Physical Activity: Not on file  Stress: Not on file  Social Connections: Not on file  Intimate Partner Violence: Not on file     Review of Systems    General:  No chills, fever, night sweats or weight changes.  Cardiovascular:  No chest pain, +++ dyspnea on exertion at higher levels of activity, +++ mild chronic lower extremity edema, no orthopnea, palpitations, paroxysmal nocturnal dyspnea. Dermatological: No rash, lesions/masses Respiratory: No cough, +++ dyspnea at high levels of activity. Urologic: No hematuria, dysuria Abdominal:   No nausea, vomiting, diarrhea, bright red blood per rectum, melena, or hematemesis Neurologic:  No visual changes, wkns, changes in mental status. All other systems reviewed and are otherwise negative except as noted above.     Physical Exam    VS:  BP 106/60 (BP Location: Left Arm, Patient Position: Sitting, Cuff Size: Normal)   Pulse 61   Ht 5\' 8"  (1.727 m)   Wt 190 lb (86.2 kg)   SpO2 97%    BMI 28.89 kg/m  , BMI Body mass index is 28.89 kg/m.  GEN: Well nourished, well developed, in no acute distress. HEENT: normal. Neck: Supple, no JVD, carotid bruits, or masses. Cardiac: RRR, no murmurs, rubs, or gallops. No clubbing, cyanosis.  Lower legs are mildly erythematous and psoriatic with trace bilateral lower extremity edema.  Radials 2+/PT 2+ and equal bilaterally.  Respiratory:  Respirations regular and unlabored, clear to auscultation bilaterally. GI: Obese, protuberant, nontender, nondistended, BS + x 4. MS: no deformity or atrophy. Skin: warm and dry, no rash. Neuro:  Strength and sensation are intact. Psych: Normal affect.  Accessory Clinical Findings    ECG personally reviewed by me today- EKG Interpretation Date/Time:  Friday Dec 10 2023 11:30:59 EDT Ventricular Rate:  61 PR Interval:  176 QRS Duration:  94 QT Interval:  430 QTC Calculation: 432 R Axis:   9  Text Interpretation: Normal sinus rhythm Normal ECG Confirmed by Laneta Pintos [phone]) on 12/10/2023 12:01:59 PM  - No acute changes  Labs dated Dec 09, 2023 from [facility]:  Hemoglobin 12.2, hematocrit 36.5, WBC 3.7, platelets 157 Sodium 141, potassium 4.1, chloride 103, CO2 29.3, BUN 11, creatinine 0.7, glucose 90 Calcium 9.4, albumin 4.3, total protein 6.7 Total bilirubin 0.3, alkaline phosphatase 50, AST 20, ALT 24 Total cholesterol 141, triglycerides 172, HDL 49.4, LDL 57 TSH 2.645 Hemoglobin A1c 6.6    Assessment & Plan   1.  Preoperative cardiovascular examination: Patient pending multiple tooth extractions and left total hip arthroplasty.  Prior history of normal coronary arteries by catheterization 2010 with low risk stress test (inferior ischemia) on stress test in 2018.  Echo at that time showed moderate aortic insufficiency.  He has chronic, stable dyspnea on exertion though activity is more limited by left hip pain than anything at this point.  He is capable of walking up a  flight of steps without chest pain or dyspnea and also performing moderate housework.  Peak achievable METS of 5.5.  In light of moderate AI on prior echo, I will follow-up an echocardiogram prior to planned procedures.  Provided that this is stable, given low risk for cardiac event based on RCRI, he should not need additional ischemic evaluation.  2.  Moderate aortic insufficiency: Noted on echocardiogram in 2018.  Patient has chronic, stable dyspnea on exertion.  I do not appreciate a diastolic murmur on examination.  Follow-up echocardiogram.  3.  Primary hypertension: Well-controlled on beta-blocker and ACE inhibitor therapy.  4.  Type 2 diabetes mellitus: On metformin with A1c of 6.6-drawn yesterday.  Followed by primary care.  5.  Hyperlipidemia: LDL of 57 with normal LFTs on labs yesterday.  He remains on Zetia therapy.  6.  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  Says that helps him sleep.  Cessation advised.  7.  Disposition: Follow-up 2D echocardiogram.  Provide at this stable, plan to follow-up in clinic in 1 year or sooner if necessary.  Laneta Pintos, NP 12/10/2023, 12:59 PM

## 2023-12-10 NOTE — Telephone Encounter (Signed)
 Caller Nils Baseman) is following-up on the status of patient's clearance for dental surgery.

## 2023-12-10 NOTE — Patient Instructions (Signed)
 Medication Instructions:  Your physician recommends that you continue on your current medications as directed. Please refer to the Current Medication list given to you today.  *If you need a refill on your cardiac medications before your next appointment, please call your pharmacy*  Testing/Procedures: Your physician has requested that you have an echocardiogram. Echocardiography is a painless test that uses sound waves to create images of your heart. It provides your doctor with information about the size and shape of your heart and how well your heart's chambers and valves are working. This procedure takes approximately one hour. There are no restrictions for this procedure. Please do NOT wear cologne, perfume, aftershave, or lotions (deodorant is allowed). Please arrive 15 minutes prior to your appointment time.  Please note: We ask at that you not bring children with you during ultrasound (echo/ vascular) testing. Due to room size and safety concerns, children are not allowed in the ultrasound rooms during exams. Our front office staff cannot provide observation of children in our lobby area while testing is being conducted. An adult accompanying a patient to their appointment will only be allowed in the ultrasound room at the discretion of the ultrasound technician under special circumstances. We apologize for any inconvenience.   Follow-Up: At [facility], you and your health needs are our priority.  As part of our continuing mission to provide you with exceptional heart care, our providers are all part of one team.  This team includes your primary Cardiologist (physician) and Advanced Practice Providers or APPs (Physician Assistants and Nurse Practitioners) who all work together to provide you with the care you need, when you need it.  Your next appointment:   12 month(s)  Provider:   Timothy Gollan, MD    We recommend signing up for the patient portal called "MyChart".  Sign  up information is provided on this After Visit Summary.  MyChart is used to connect with patients for Virtual Visits (Telemedicine).  Patients are able to view lab/test results, encounter notes, upcoming appointments, etc.  Non-urgent messages can be sent to your provider as well.   To learn more about what you can do with MyChart, go to [email or web address].

## 2023-12-14 ENCOUNTER — Encounter (INDEPENDENT_AMBULATORY_CARE_PROVIDER_SITE_OTHER): Payer: Self-pay

## 2023-12-16 ENCOUNTER — Ambulatory Visit: Admit: 2023-12-16 | Admitting: Surgery

## 2023-12-16 SURGERY — ARTHROPLASTY, HIP, TOTAL,POSTERIOR APPROACH
Anesthesia: Choice | Site: Hip | Laterality: Left

## 2023-12-23 ENCOUNTER — Ambulatory Visit: Payer: Self-pay | Admitting: Nurse Practitioner

## 2023-12-23 ENCOUNTER — Ambulatory Visit: Attending: Nurse Practitioner

## 2023-12-23 DIAGNOSIS — I351 Nonrheumatic aortic (valve) insufficiency: Secondary | ICD-10-CM

## 2023-12-23 LAB — ECHOCARDIOGRAM COMPLETE
AR max vel: 3.41 cm2
AV Area VTI: 3.52 cm2
AV Area mean vel: 3.34 cm2
AV Mean grad: 3 mmHg
AV Peak grad: 4.7 mmHg
Ao pk vel: 1.08 m/s
Area-P 1/2: 3.31 cm2
Calc EF: 57.5 %
P 1/2 time: 403 ms
S' Lateral: 3.1 cm
Single Plane A2C EF: 55.5 %
Single Plane A4C EF: 60.4 %

## 2023-12-27 NOTE — Telephone Encounter (Signed)
 Brian Callahan is calling to follow up on clearance. Brian Callahan would like us  to fax the clearance and recent echo results to [phone].

## 2023-12-27 NOTE — Telephone Encounter (Signed)
 I have reviewed the notes from Odessa Bene, NP on echo which indicated pt has been cleared. Will forward to preop APP to review if any further notes needed and if not ok then to fax to requesting office.

## 2023-12-27 NOTE — Telephone Encounter (Signed)
 Echo results and comments on preoperative cardiac evaluation faxed to requesting office. Will remove from preoperative pool.

## 2024-02-08 ENCOUNTER — Telehealth (INDEPENDENT_AMBULATORY_CARE_PROVIDER_SITE_OTHER): Payer: Self-pay

## 2024-02-08 NOTE — Telephone Encounter (Signed)
 Patient's spouse called wanting to schedule the patient to have a IVC filter place with Dr. Marea. Patient has been scheduled for 03/06/24 with a 10:00 am arrival time to the [facility]. Pre-procedure instructions were discussed and will be sent to Mychart and mailed.

## 2024-03-02 ENCOUNTER — Other Ambulatory Visit: Payer: Self-pay | Admitting: Surgery

## 2024-03-06 ENCOUNTER — Ambulatory Visit
Admission: RE | Admit: 2024-03-06 | Discharge: 2024-03-06 | Disposition: A | Discharge status: Home / Self Care | Attending: Vascular Surgery | Admitting: Vascular Surgery

## 2024-03-06 ENCOUNTER — Encounter
Admission: RE | Disposition: A | Discharge status: Home / Self Care | Payer: Self-pay | Source: Home / Self Care | Attending: Vascular Surgery

## 2024-03-06 ENCOUNTER — Encounter: Payer: Self-pay | Admitting: Vascular Surgery

## 2024-03-06 ENCOUNTER — Telehealth: Payer: Self-pay

## 2024-03-06 ENCOUNTER — Other Ambulatory Visit: Payer: Self-pay

## 2024-03-06 DIAGNOSIS — I10 Essential (primary) hypertension: Secondary | ICD-10-CM | POA: Diagnosis not present

## 2024-03-06 DIAGNOSIS — Z87891 Personal history of nicotine dependence: Secondary | ICD-10-CM | POA: Diagnosis not present

## 2024-03-06 DIAGNOSIS — E119 Type 2 diabetes mellitus without complications: Secondary | ICD-10-CM | POA: Diagnosis not present

## 2024-03-06 DIAGNOSIS — Z86718 Personal history of other venous thrombosis and embolism: Secondary | ICD-10-CM | POA: Diagnosis not present

## 2024-03-06 DIAGNOSIS — K922 Gastrointestinal hemorrhage, unspecified: Secondary | ICD-10-CM

## 2024-03-06 DIAGNOSIS — I82409 Acute embolism and thrombosis of unspecified deep veins of unspecified lower extremity: Secondary | ICD-10-CM

## 2024-03-06 DIAGNOSIS — Z8719 Personal history of other diseases of the digestive system: Secondary | ICD-10-CM | POA: Insufficient documentation

## 2024-03-06 DIAGNOSIS — Z408 Encounter for other prophylactic surgery: Secondary | ICD-10-CM | POA: Diagnosis present

## 2024-03-06 HISTORY — PX: IVC FILTER INSERTION: CATH118245

## 2024-03-06 LAB — GLUCOSE, CAPILLARY: Glucose-Capillary: 112 mg/dL — ABNORMAL HIGH (ref 70–99)

## 2024-03-06 SURGERY — IVC FILTER INSERTION
Anesthesia: Moderate Sedation

## 2024-03-06 MED ORDER — SODIUM CHLORIDE 0.9 % IV SOLN: INTRAVENOUS | Status: DC

## 2024-03-06 MED ORDER — FAMOTIDINE 20 MG PO TABS
ORAL_TABLET | ORAL | Status: AC
  Filled 2024-03-06: qty 2

## 2024-03-06 MED ORDER — CEFAZOLIN SODIUM-DEXTROSE 2-4 GM/100ML-% IV SOLN
INTRAVENOUS | Status: AC
  Filled 2024-03-06: qty 100

## 2024-03-06 MED ORDER — DIPHENHYDRAMINE HCL 50 MG/ML IJ SOLN
INTRAMUSCULAR | Status: AC
  Filled 2024-03-06: qty 1

## 2024-03-06 MED ORDER — HEPARIN (PORCINE) IN NACL 1000-0.9 UT/500ML-% IV SOLN
INTRAVENOUS | Status: DC | PRN
  Administered 2024-03-06 (×2): 500 mL

## 2024-03-06 MED ORDER — DIPHENHYDRAMINE HCL 50 MG/ML IJ SOLN: 50.0000 mg | Freq: Once | INTRAMUSCULAR | Status: DC | PRN

## 2024-03-06 MED ORDER — HYDROMORPHONE HCL 1 MG/ML IJ SOLN: 1.0000 mg | Freq: Once | INTRAMUSCULAR | Status: DC | PRN

## 2024-03-06 MED ORDER — ONDANSETRON HCL 4 MG/2ML IJ SOLN: 4.0000 mg | Freq: Four times a day (QID) | INTRAMUSCULAR | Status: DC | PRN

## 2024-03-06 MED ORDER — FAMOTIDINE 20 MG PO TABS: 40.0000 mg | ORAL_TABLET | Freq: Once | ORAL | Status: DC | PRN

## 2024-03-06 MED ORDER — MIDAZOLAM HCL 5 MG/5ML IJ SOLN
INTRAMUSCULAR | Status: AC
  Filled 2024-03-06: qty 5

## 2024-03-06 MED ORDER — METHYLPREDNISOLONE SODIUM SUCC 125 MG IJ SOLR
INTRAMUSCULAR | Status: AC
  Filled 2024-03-06: qty 2

## 2024-03-06 MED ORDER — FENTANYL CITRATE (PF) 100 MCG/2ML IJ SOLN
INTRAMUSCULAR | Status: DC | PRN
  Administered 2024-03-06 (×2): 50 ug via INTRAVENOUS

## 2024-03-06 MED ORDER — LIDOCAINE-EPINEPHRINE (PF) 1 %-1:200000 IJ SOLN
INTRAMUSCULAR | Status: DC | PRN
  Administered 2024-03-06 (×2): 10 mL

## 2024-03-06 MED ORDER — MIDAZOLAM HCL 2 MG/2ML IJ SOLN
INTRAMUSCULAR | Status: DC | PRN
Start: 2024-03-06 — End: 2024-03-06
  Administered 2024-03-06 (×2): 1 mg via INTRAVENOUS

## 2024-03-06 MED ORDER — METHYLPREDNISOLONE SODIUM SUCC 125 MG IJ SOLR: 125.0000 mg | Freq: Once | INTRAMUSCULAR | Status: DC | PRN

## 2024-03-06 MED ORDER — MIDAZOLAM HCL 2 MG/ML PO SYRP: 8.0000 mg | ORAL_SOLUTION | Freq: Once | ORAL | Status: DC | PRN

## 2024-03-06 MED ORDER — CEFAZOLIN SODIUM-DEXTROSE 2-4 GM/100ML-% IV SOLN
2.0000 g | INTRAVENOUS | Status: AC
  Administered 2024-03-06 (×2): 2 g via INTRAVENOUS

## 2024-03-06 MED ORDER — FENTANYL CITRATE (PF) 100 MCG/2ML IJ SOLN
INTRAMUSCULAR | Status: AC
Start: 2024-03-06 — End: 2024-03-06
  Filled 2024-03-06: qty 2

## 2024-03-06 MED ORDER — HEPARIN SODIUM (PORCINE) 1000 UNIT/ML IJ SOLN
INTRAMUSCULAR | Status: AC
  Filled 2024-03-06: qty 10

## 2024-03-06 MED ORDER — IODIXANOL 320 MG/ML IV SOLN
INTRAVENOUS | Status: DC | PRN
Start: 2024-03-06 — End: 2024-03-06
  Administered 2024-03-06 (×2): 10 mL

## 2024-03-06 SURGICAL SUPPLY — 4 items
KIT FEM OPTION ELITE FILTER (Filter) IMPLANT
PACK ANGIOGRAPHY (CUSTOM PROCEDURE TRAY) ×1 IMPLANT
WIRE J 3MM .035X145CM (WIRE) IMPLANT
WIRE SUPRACORE 190CM (WIRE) IMPLANT

## 2024-03-06 NOTE — Telephone Encounter (Signed)
  Patient Consent for Virtual Visit        Brian Callahan has provided verbal consent on 03/06/2024 for a virtual visit (video or telephone).   CONSENT FOR VIRTUAL VISIT FOR:  Brian Callahan  By participating in this virtual visit I agree to the following:  I hereby voluntarily request, consent and authorize [facility] and its employed or contracted physicians, physician assistants, nurse practitioners or other licensed health care professionals (the Practitioner), to provide me with telemedicine health care services (the "Services) as deemed necessary by the treating Practitioner. I acknowledge and consent to receive the Services by the Practitioner via telemedicine. I understand that the telemedicine visit will involve communicating with the Practitioner through live audiovisual communication technology and the disclosure of certain medical information by electronic transmission. I acknowledge that I have been given the opportunity to request an in-person assessment or other available alternative prior to the telemedicine visit and am voluntarily participating in the telemedicine visit.  I understand that I have the right to withhold or withdraw my consent to the use of telemedicine in the course of my care at any time, without affecting my right to future care or treatment, and that the Practitioner or I may terminate the telemedicine visit at any time. I understand that I have the right to inspect all information obtained and/or recorded in the course of the telemedicine visit and may receive copies of available information for a reasonable fee.  I understand that some of the potential risks of receiving the Services via telemedicine include:  Delay or interruption in medical evaluation due to technological equipment failure or disruption; Information transmitted may not be sufficient (e.g. poor resolution of images) to allow for appropriate medical decision making by the Practitioner;  and/or  In rare instances, security protocols could fail, causing a breach of personal health information.  Furthermore, I acknowledge that it is my responsibility to provide information about my medical history, conditions and care that is complete and accurate to the best of my ability. I acknowledge that Practitioner's advice, recommendations, and/or decision may be based on factors not within their control, such as incomplete or inaccurate data provided by me or distortions of diagnostic images or specimens that may result from electronic transmissions. I understand that the practice of medicine is not an exact science and that Practitioner makes no warranties or guarantees regarding treatment outcomes. I acknowledge that a copy of this consent can be made available to me via my patient portal [facility] MyChart), or I can request a printed copy by calling the office of [facility].    I understand that my insurance will be billed for this visit.   I have read or had this consent read to me. I understand the contents of this consent, which adequately explains the benefits and risks of the Services being provided via telemedicine.  I have been provided ample opportunity to ask questions regarding this consent and the Services and have had my questions answered to my satisfaction. I give my informed consent for the services to be provided through the use of telemedicine in my medical care

## 2024-03-06 NOTE — Telephone Encounter (Signed)
-----   Message from Brian CHARLENA Callahan sent at 03/04/2024 12:18 AM EDT ----- Regarding: Request for pre-operative cardiac clearance Request for pre-operative cardiac clearance:  1. What type of surgery is being performed?  ARTHROPLASTY, HIP, TOTAL,POSTERIOR APPROACH  2. When is this surgery scheduled?  03/14/2024  3. Type of clearance being requested (medical, pharmacy, both)? MEDICAL   4. Are there any medications that need to be held prior to surgery? NONE  5. Practice name and name of physician performing surgery?  Performing surgeon: Dr. Norleen Maltos, MD Requesting clearance: Brian Pereyra, [name]    6. Anesthesia type (none, local, MAC, general)? GENERAL  7. What is the office phone and fax number?   Phone: [phone] Fax: [phone]  ATTENTION: Unable to create telephone message as per your standard workflow. Directed by [facility] providers to send requests for cardiac clearance to this pool for appropriate distribution to provider covering pre-operative clearances.   Brian Pereyra, [name], [name], [name], [name] [facility] [employer]  Peri-operative Services Nurse Practitioner Phone: [phone] 03/04/24 12:18 AM

## 2024-03-06 NOTE — Telephone Encounter (Signed)
   Name: Brian Callahan  DOB: [birth year]  MRN: [id number]  Primary Cardiologist: Evalene Lunger, MD  Preoperative team, please contact this patient and set up a phone call appointment for further preoperative risk assessment. Please obtain consent and complete medication review. Thank you for your help.  I confirm that guidance regarding antiplatelet and oral anticoagulation therapy has been completed and, if necessary, noted below. None requested.   I also confirmed the patient resides in the state of [place] . As per [facility] telemedicine laws, the patient must reside in the state in which the provider is licensed.  Aireonna Bauer D Omere Marti, NP 03/06/2024, 9:58 AM [facility]

## 2024-03-06 NOTE — Telephone Encounter (Signed)
 Spoke to the patient's wife and he has been scheduled for pre-op clearance on 03/10/24 with Damien Braver, NP.

## 2024-03-06 NOTE — Op Note (Signed)
 [place] [facility]   OPERATIVE NOTE    PRE-OPERATIVE DIAGNOSIS: GI bleed, major upcoming orthopedic surgery  POST-OPERATIVE DIAGNOSIS: same as above  PROCEDURE: 1.   Ultrasound guidance for vascular access to the right femoral vein 2.   Catheter placement into the inferior vena cava 3.   Inferior venacavogram 4.   Placement of a Option Elite IVC filter  SURGEON: Selinda Gu, MD  ASSISTANT(S): None  ANESTHESIA: local with Moderate Conscious Sedation for approximately 12 minutes using 1 mg of Versed  and 50 mcg of Fentanyl   ESTIMATED BLOOD LOSS: minimal  CONTRAST: 10 cc  FLUORO TIME: less than one minute  FINDING(S): 1.  Patent IVC  SPECIMEN(S):  none  INDICATIONS:   Brian Callahan is a 83 y.o. male who presents with major upcoming orthopedic surgery and a GI bleed.  Inferior vena cava filter is indicated for this reason.  Risks and benefits including filter thrombosis, migration, fracture, bleeding, and infection were all discussed.  We discussed that all IVC filters that we place can be removed if desired from the patient once the need for the filter has passed.    DESCRIPTION: After obtaining full informed written consent, the patient was brought back to the vascular suite. The skin was sterilely prepped and draped in a sterile surgical field was created. Moderate conscious sedation was administered during a face to face encounter with the patient throughout the procedure with my supervision of the RN administering medicines and monitoring the patient's vital signs, pulse oximetry, telemetry and mental status throughout from the start of the procedure until the patient was taken to the recovery room. The right femoral vein was accessed under direct ultrasound guidance without difficulty with a Seldinger needle and a J-wire was then placed. After skin nick and dilatation, the delivery sheath was placed into the inferior vena cava and an inferior venacavogram was  performed. This demonstrated a patent IVC with the level of the renal veins at L1.  The filter was then deployed into the inferior vena cava at the level of L2 just below the renal veins. The delivery sheath was then removed. Pressure was held. Sterile dressings were placed. The patient tolerated the procedure well and was taken to the recovery room in stable condition.  COMPLICATIONS: None  CONDITION: Stable  Selinda Gu  03/06/2024, 12:35 PM   This note was created with Dragon Medical transcription system. Any errors in dictation are purely unintentional.

## 2024-03-06 NOTE — Telephone Encounter (Signed)
  Patient Consent for Virtual Visit        [name] has provided verbal consent on 03/06/2024 for a virtual visit (video or telephone).   CONSENT FOR VIRTUAL VISIT FOR:  Brian Callahan  By participating in this virtual visit I agree to the following:  I hereby voluntarily request, consent and authorize [facility] and its employed or contracted physicians, physician assistants, nurse practitioners or other licensed health care professionals (the Practitioner), to provide me with telemedicine health care services (the "Services) as deemed necessary by the treating Practitioner. I acknowledge and consent to receive the Services by the Practitioner via telemedicine. I understand that the telemedicine visit will involve communicating with the Practitioner through live audiovisual communication technology and the disclosure of certain medical information by electronic transmission. I acknowledge that I have been given the opportunity to request an in-person assessment or other available alternative prior to the telemedicine visit and am voluntarily participating in the telemedicine visit.  I understand that I have the right to withhold or withdraw my consent to the use of telemedicine in the course of my care at any time, without affecting my right to future care or treatment, and that the Practitioner or I may terminate the telemedicine visit at any time. I understand that I have the right to inspect all information obtained and/or recorded in the course of the telemedicine visit and may receive copies of available information for a reasonable fee.  I understand that some of the potential risks of receiving the Services via telemedicine include:  Delay or interruption in medical evaluation due to technological equipment failure or disruption; Information transmitted may not be sufficient (e.g. poor resolution of images) to allow for appropriate medical decision making by the Practitioner;  and/or  In rare instances, security protocols could fail, causing a breach of personal health information.  Furthermore, I acknowledge that it is my responsibility to provide information about my medical history, conditions and care that is complete and accurate to the best of my ability. I acknowledge that Practitioner's advice, recommendations, and/or decision may be based on factors not within their control, such as incomplete or inaccurate data provided by me or distortions of diagnostic images or specimens that may result from electronic transmissions. I understand that the practice of medicine is not an exact science and that Practitioner makes no warranties or guarantees regarding treatment outcomes. I acknowledge that a copy of this consent can be made available to me via my patient portal [facility] MyChart), or I can request a printed copy by calling the office of [facility].    I understand that my insurance will be billed for this visit.   I have read or had this consent read to me. I understand the contents of this consent, which adequately explains the benefits and risks of the Services being provided via telemedicine.  I have been provided ample opportunity to ask questions regarding this consent and the Services and have had my questions answered to my satisfaction. I give my informed consent for the services to be provided through the use of telemedicine in my medical care

## 2024-03-06 NOTE — H&P (Signed)
 [facility] VEIN SPECIALISTS Admission History & Physical  MRN : [id number]  Brian Callahan is a 83 y.o. (12/22/1940) male who presents with chief complaint of No chief complaint on file. SABRA  History of Present Illness: Patient presents today for his IVC filter placement preliminary to his upcoming major orthopedic surgery.  No fevers or chills.  No chest pain or shortness of breath.  No complaints today.  No current facility-administered medications for this encounter.    Past Medical History:  Diagnosis Date   Anemia    pt denies 01/18/20   Aortic insufficiency    a. 06/2017 Echo: EF 50% with mild LVH, nl RV function, moderate AI, and mild MR/TR/PR.   BPH with obstruction/lower urinary tract symptoms    Chest pain    a. 2010 Cath ([facility]): reportedly nl; b. 06/2017 [facility]): EF 63%, mild inf wall ischemia->med rx.   COPD (chronic obstructive pulmonary disease) (HCC)    Diabetes mellitus without complication (HCC)    diet controlled   Dyspnea    Encounter for long-term (current) use of other high-risk medications    GERD (gastroesophageal reflux disease)    gastritis   GI bleed    Gout    H/O hernia repair mid-90s   x2   History of elevated PSA    History of Rocky [place] spotted fever    HLD (hyperlipidemia)    HTN (hypertension)    Hyperglycemia    Hyperuricemia    Incomplete bladder emptying    Macular degeneration    Obesity    Psoriasis    Psoriasis    Urinary frequency     Past Surgical History:  Procedure Laterality Date   APPENDECTOMY     BACK SURGERY     neck and spine 2007   BIOPSY  09/15/2023   Procedure: BIOPSY;  Surgeon: Aundria, Ladell POUR, MD;  Location: [facility] ENDOSCOPY;  Service: Gastroenterology;;   CERVICAL SPINE SURGERY  2007   CHOLECYSTECTOMY N/A 10/18/2017   Procedure: LAPAROSCOPIC CHOLECYSTECTOMY;  Surgeon: Rodolph Romano, MD;  Location: [facility] ORS;  Service: General;  Laterality: N/A;   COLECTOMY  1998   partial, due to  obstruction   COLONOSCOPY     lost 10 units of blood   COLONOSCOPY WITH PROPOFOL  N/A 09/27/2019   Procedure: COLONOSCOPY WITH PROPOFOL ;  Surgeon: [name], Ladell POUR, MD;  Location: [facility] ENDOSCOPY;  Service: Gastroenterology;  Laterality: N/A;   CYSTOSCOPY WITH INSERTION OF UROLIFT N/A 01/23/2020   Procedure: CYSTOSCOPY WITH INSERTION OF UROLIFT;  Surgeon: Twylla Glendia BROCKS, MD;  Location: [facility] ORS;  Service: Urology;  Laterality: N/A;   ESOPHAGOGASTRODUODENOSCOPY N/A 10/14/2016   Procedure: ESOPHAGOGASTRODUODENOSCOPY (EGD);  Surgeon: Lamar ONEIDA Holmes, MD;  Location: [facility] ENDOSCOPY;  Service: Endoscopy;  Laterality: N/A;   ESOPHAGOGASTRODUODENOSCOPY (EGD) WITH PROPOFOL  N/A 09/27/2019   Procedure: ESOPHAGOGASTRODUODENOSCOPY (EGD) WITH PROPOFOL ;  Surgeon: [name], Ladell POUR, MD;  Location: [facility] ENDOSCOPY;  Service: Gastroenterology;  Laterality: N/A;   EYE SURGERY     bilateral cataracts   FLEXIBLE SIGMOIDOSCOPY N/A 09/15/2023   Procedure: FLEXIBLE SIGMOIDOSCOPY;  Surgeon: [name], Ladell POUR, MD;  Location: [facility] ENDOSCOPY;  Service: Gastroenterology;  Laterality: N/A;  DM   HERNIA REPAIR  mid-90s   x2  inguinal   POLYPECTOMY  09/15/2023   Procedure: POLYPECTOMY;  Surgeon: [name], Ladell POUR, MD;  Location: [facility] ENDOSCOPY;  Service: Gastroenterology;;   PROSTATE BIOPSY  2013   SURGERY SCROTAL / TESTICULAR     for fibrous growth  Social History   Tobacco Use   Smoking status: Former    Current packs/day: 0.00    Average packs/day: 0.3 packs/day for 10.0 years (2.5 ttl pk-yrs)    Types: Cigarettes    Start date: 10/11/1980    Quit date: 10/12/1990    Years since quitting: 33.4    Passive exposure: Past   Smokeless tobacco: Never   Tobacco comments:    Quit 20 years ago  Vaping Use   Vaping status: Never Used  Substance Use Topics   Alcohol use: Not Currently    Alcohol/week: 14.0 standard drinks of alcohol    Types: 14 Shots of liquor per week    Comment: 2 whiskeys per night to help him  sleep   Drug use: No     Family History  Problem Relation Age of Onset   Kidney failure Brother    Prostate cancer Neg Hx     Allergies  Allergen Reactions   Isosorbide Nitrate Other (See Comments)    unkn   Shellfish Allergy Other (See Comments)    gout   Trazodone And Nefazodone Diarrhea     REVIEW OF SYSTEMS (Negative unless checked)  Constitutional: [] Weight loss  [] Fever  [] Chills Cardiac: [] Chest pain   [] Chest pressure   [] Palpitations   [] Shortness of breath when laying flat   [] Shortness of breath at rest   [] Shortness of breath with exertion. Vascular:  [] Pain in legs with walking   [] Pain in legs at rest   [] Pain in legs when laying flat   [] Claudication   [] Pain in feet when walking  [] Pain in feet at rest  [] Pain in feet when laying flat   [] History of DVT   [] Phlebitis   [] Swelling in legs   [] Varicose veins   [] Non-healing ulcers Pulmonary:   [] Uses home oxygen   [] Productive cough   [] Hemoptysis   [] Wheeze  [x] COPD   [] Asthma Neurologic:  [] Dizziness  [] Blackouts   [] Seizures   [] History of stroke   [] History of TIA  [] Aphasia   [] Temporary blindness   [] Dysphagia   [] Weakness or numbness in arms   [] Weakness or numbness in legs Musculoskeletal:  [x] Arthritis   [] Joint swelling   [x] Joint pain   [] Low back pain Hematologic:  [] Easy bruising  [] Easy bleeding   [] Hypercoagulable state   [] Anemic  [] Hepatitis Gastrointestinal:  [] Blood in stool   [] Vomiting blood  [x] Gastroesophageal reflux/heartburn   [] Difficulty swallowing. Genitourinary:  [] Chronic kidney disease   [] Difficult urination  [] Frequent urination  [] Burning with urination   [] Blood in urine Skin:  [] Rashes   [] Ulcers   [] Wounds Psychological:  [] History of anxiety   []  History of major depression.  Physical Examination  There were no vitals filed for this visit. There is no height or weight on file to calculate BMI. Gen: WD/WN, NAD Head: /AT, No temporalis wasting. Ear/Nose/Throat: Hearing  grossly intact, nares w/o erythema or drainage, oropharynx w/o Erythema/Exudate,  Eyes: Conjunctiva clear, sclera non-icteric Neck: Trachea midline.  No JVD.  Pulmonary:  Good air movement, respirations not labored, no use of accessory muscles.  Cardiac: RRR, normal S1, S2. Vascular:  Vessel Right Left  Radial Palpable Palpable               Musculoskeletal: M/S 5/5 throughout.  Extremities without ischemic changes.  No deformity or atrophy.  Neurologic: Sensation grossly intact in extremities.  Symmetrical.  Speech is fluent. Motor exam as listed above. Psychiatric: Judgment intact, Mood & affect appropriate for pt's  clinical situation. Dermatologic: No rashes or ulcers noted.  No cellulitis or open wounds.      CBC Lab Results  Component Value Date   WBC 3.4 (L) 10/28/2023   HGB 12.1 (L) 10/28/2023   HCT 36.5 (L) 10/28/2023   MCV 102.0 (H) 10/28/2023   PLT 163 10/28/2023    BMET    Component Value Date/Time   NA 137 12/18/2022 1313   NA 141 12/02/2013 0404   K 4.1 12/18/2022 1313   K 4.0 12/02/2013 0404   CL 100 12/18/2022 1313   CL 110 (H) 12/02/2013 0404   CO2 27 12/18/2022 1313   CO2 28 12/02/2013 0404   GLUCOSE 156 (H) 12/18/2022 1313   GLUCOSE 172 (H) 12/02/2013 0404   BUN 14 12/18/2022 1313   BUN 12 12/02/2013 0404   CREATININE 0.77 12/18/2022 1313   CREATININE 0.88 12/02/2013 0404   CALCIUM 9.7 12/18/2022 1313   CALCIUM 7.9 (L) 12/02/2013 0404   GFRNONAA >60 12/18/2022 1313   GFRNONAA >60 12/02/2013 0404   GFRAA >60 12/02/2013 0404   CrCl cannot be calculated (Patient's most recent lab result is older than the maximum 21 days allowed.).  COAG Lab Results  Component Value Date   INR 1.0 12/01/2013   INR 1.0 04/07/2012    Radiology No results found.   Assessment/Plan 1. Gastrointestinal hemorrhage, unspecified gastrointestinal hemorrhage type (Primary) The patient has a significant history of of GI bleed and would not be able to undergo  anticoagulation if he were to develop a DVT postsurgery.  Based on this and discussion with patient and his surgeon, it was felt to prophylactically place an IVC filter.   IVC filter placement will be done the week for surgery. Risk and benefits were reviewed the patient.  Indications for the procedure were reviewed.  All questions were answered, the patient agrees to proceed.    There was also discussed that the purpose of the IVC filter is not necessary to prevent lower extremity DVT but to prevent the progression to a pulmonary embolism.  This was understand by the patient and his wife.   The patient will follow-up with me 2-3 months after the joint replacement surgery to discuss removal (this was also discussed today and the patient agrees with the plan to have the filter removed).    2. Primary hypertension Continue antihypertensive medications as already ordered, these medications have been reviewed and there are no changes at this time.   3. Diabetes mellitus type 2 with complications (HCC) Continue hypoglycemic medications as already ordered, these medications have been reviewed and there are no changes at this time.   Selinda Gu, MD  03/06/2024 9:48 AM

## 2024-03-06 NOTE — Telephone Encounter (Signed)
   Pre-operative Risk Assessment    Patient Name: Brian Callahan  DOB: [birth year] MRN: [id number]   Date of last office visit: 12/10/23 Brian MEAGER, NP Date of next office visit: NONE   Request for Surgical Clearance    Procedure:  ARTHROPLASTY, HIP, TOTAL,POSTERIOR APPROACH  Date of Surgery:  Clearance 03/14/24                                Surgeon:  Dr. Norleen Maltos, MD  Surgeon's Group or Practice Name:  [facility]  Phone number:  [phone] Fax number:  [phone]   Type of Clearance Requested:   - Medical    Type of Anesthesia:  General    Additional requests/questions:    SignedLucie DELENA Callahan   03/06/2024, 9:15 AM      Brian Brian BRAVO, NP  P Cv Div Preop Callback Request for pre-operative cardiac clearance:   1. What type of surgery is being performed? ARTHROPLASTY, HIP, TOTAL,POSTERIOR APPROACH  2. When is this surgery scheduled? 03/14/2024   3. Type of clearance being requested (medical, pharmacy, both)? MEDICAL   4. Are there any medications that need to be held prior to surgery? NONE  5. Practice name and name of physician performing surgery? Performing surgeon: Dr. Norleen Maltos, MD Requesting clearance: Brian Elnor, [name]     6. Anesthesia type (none, local, MAC, general)? GENERAL  7. What is the office phone and fax number?   Phone: [phone] Fax: [phone]  ATTENTION: Unable to create telephone message as per your standard workflow. Directed by [facility] providers to send requests for cardiac clearance to this pool for appropriate distribution to provider covering pre-operative clearances.  Brian Elnor, [name], [name], [name], [name] [facility] Peri-operative Services Nurse Practitioner Phone: [phone] 03/04/24 12:18 AM

## 2024-03-06 NOTE — Telephone Encounter (Signed)
   Pre-operative Risk Assessment    Patient Name: Brian Callahan  DOB: 1940-11-28 MRN: [id number]   Date of last office visit: 12/10/23 LONNI MEAGER, NP Date of next office visit: NONE   Request for Surgical Clearance    Procedure:  ARTHROPLASTY, HIP, TOTAL,POSTERIOR APPROACH  Date of Surgery:  Clearance 03/14/24                                Surgeon:  Dr. Norleen Maltos, MD  Surgeon's Group or Practice Name:  [facility]  Phone number:  [phone] Fax number:  [phone]   Type of Clearance Requested:   - Medical    Type of Anesthesia:  General    Additional requests/questions:    SignedLucie DELENA Ku   03/06/2024, 9:15 AM      [name], NP  P Cv Div Preop Callback Request for pre-operative cardiac clearance:   1. What type of surgery is being performed? ARTHROPLASTY, HIP, TOTAL,POSTERIOR APPROACH  2. When is this surgery scheduled? 03/14/2024   3. Type of clearance being requested (medical, pharmacy, both)? MEDICAL   4. Are there any medications that need to be held prior to surgery? NONE  5. Practice name and name of physician performing surgery? Performing surgeon: Dr. Norleen Maltos, MD Requesting clearance: [name], [name]     6. Anesthesia type (none, local, MAC, general)? GENERAL  7. What is the office phone and fax number?   Phone: [phone] Fax: [phone]  ATTENTION: Unable to create telephone message as per your standard workflow. Directed by [facility] providers to send requests for cardiac clearance to this pool for appropriate distribution to provider covering pre-operative clearances.  [name], [name], [name], [name], [name] [facility] Peri-operative Services Nurse Practitioner Phone: [phone] 03/04/24 12:18 AM

## 2024-03-09 ENCOUNTER — Other Ambulatory Visit: Payer: Self-pay

## 2024-03-09 ENCOUNTER — Encounter
Admission: RE | Admit: 2024-03-09 | Discharge: 2024-03-09 | Disposition: A | Discharge status: Home / Self Care | Source: Ambulatory Visit | Attending: Surgery | Admitting: Surgery

## 2024-03-09 VITALS — BP 168/68 | HR 101 | Resp 16 | Ht 68.0 in | Wt 195.7 lb

## 2024-03-09 DIAGNOSIS — Z01818 Encounter for other preprocedural examination: Secondary | ICD-10-CM | POA: Insufficient documentation

## 2024-03-09 DIAGNOSIS — M1612 Unilateral primary osteoarthritis, left hip: Secondary | ICD-10-CM

## 2024-03-09 HISTORY — DX: Gastrointestinal hemorrhage, unspecified: K92.2

## 2024-03-09 HISTORY — DX: Type 2 diabetes mellitus without complications: E11.9

## 2024-03-09 HISTORY — DX: Presence of other vascular implants and grafts: Z95.828

## 2024-03-09 HISTORY — DX: Elevated prostate specific antigen (PSA): R97.20

## 2024-03-09 HISTORY — DX: Unilateral primary osteoarthritis, left hip: M16.12

## 2024-03-09 HISTORY — DX: Acute (reversible) ischemia of intestine, part and extent unspecified: K55.059

## 2024-03-09 HISTORY — DX: Other forms of dyspnea: R06.09

## 2024-03-09 HISTORY — DX: Disorders of diaphragm: J98.6

## 2024-03-09 HISTORY — DX: Benign prostatic hyperplasia without lower urinary tract symptoms: N40.0

## 2024-03-09 HISTORY — DX: Personal history of malignant neoplasm of prostate: Z85.46

## 2024-03-09 LAB — CBC WITH DIFFERENTIAL/PLATELET
Abs Immature Granulocytes: 0.01 K/uL (ref 0.00–0.07)
Basophils Absolute: 0 K/uL (ref 0.0–0.1)
Basophils Relative: 0 %
Eosinophils Absolute: 0.2 K/uL (ref 0.0–0.5)
Eosinophils Relative: 4 %
HCT: 38.2 % — ABNORMAL LOW (ref 39.0–52.0)
Hemoglobin: 13 g/dL (ref 13.0–17.0)
Immature Granulocytes: 0 %
Lymphocytes Relative: 22 %
Lymphs Abs: 1 K/uL (ref 0.7–4.0)
MCH: 34.3 pg — ABNORMAL HIGH (ref 26.0–34.0)
MCHC: 34 g/dL (ref 30.0–36.0)
MCV: 100.8 fL — ABNORMAL HIGH (ref 80.0–100.0)
Monocytes Absolute: 0.5 K/uL (ref 0.1–1.0)
Monocytes Relative: 10 %
Neutro Abs: 2.9 K/uL (ref 1.7–7.7)
Neutrophils Relative %: 64 %
Platelets: 181 K/uL (ref 150–400)
RBC: 3.79 MIL/uL — ABNORMAL LOW (ref 4.22–5.81)
RDW: 14.6 % (ref 11.5–15.5)
WBC: 4.5 K/uL (ref 4.0–10.5)
nRBC: 0 % (ref 0.0–0.2)

## 2024-03-09 LAB — COMPREHENSIVE METABOLIC PANEL WITH GFR
ALT: 26 U/L (ref 0–44)
AST: 28 U/L (ref 15–41)
Albumin: 4.1 g/dL (ref 3.5–5.0)
Alkaline Phosphatase: 41 U/L (ref 38–126)
Anion gap: 12 (ref 5–15)
BUN: 12 mg/dL (ref 8–23)
CO2: 25 mmol/L (ref 22–32)
Calcium: 9.4 mg/dL (ref 8.9–10.3)
Chloride: 104 mmol/L (ref 98–111)
Creatinine, Ser: 0.7 mg/dL (ref 0.61–1.24)
GFR, Estimated: >60 mL/min (ref >60)
Glucose, Bld: 94 mg/dL (ref 70–99)
Potassium: 4 mmol/L (ref 3.5–5.1)
Sodium: 141 mmol/L (ref 135–145)
Total Bilirubin: 0.7 mg/dL (ref 0.0–1.2)
Total Protein: 6.9 g/dL (ref 6.5–8.1)

## 2024-03-09 LAB — SURGICAL PCR SCREEN
MRSA, PCR: NEGATIVE
Staphylococcus aureus: NEGATIVE

## 2024-03-09 LAB — TYPE AND SCREEN
ABO/RH(D): O NEG
Antibody Screen: NEGATIVE

## 2024-03-09 NOTE — Patient Instructions (Addendum)
 Your procedure is scheduled on:03-14-24 Tuesday Report to the Registration Desk on the 1st floor of the [facility].Then proceed to the 2nd floor Surgery Desk To find out your arrival time, please call [phone] between 1PM - 3PM on:03-13-24 Monday If your arrival time is 6:00 am, do not arrive before that time as the [facility] entrance doors do not open until 6:00 am.  REMEMBER: Instructions that are not followed completely may result in serious medical risk, up to and including death; or upon the discretion of your surgeon and anesthesiologist your surgery may need to be rescheduled.  Do not eat food after midnight the night before surgery.  No gum chewing or hard candies.  You may however, drink Water up to 2 hours before you are scheduled to arrive for your surgery. Do not drink anything within 2 hours of your scheduled arrival tim  In addition, your doctor has ordered for you to drink the provided:  Gatorade G2 Drinking this carbohydrate drink up to two hours before surgery helps to reduce insulin resistance and improve patient outcomes. Please complete drinking 2 hours before scheduled arrival time.  One week prior to surgery:Stop NOW (03-09-24) Stop Anti-inflammatories (NSAIDS) such as Advil, Aleve, Ibuprofen, Motrin, Naproxen, Naprosyn and Aspirin based products such as Excedrin, Goody's Powder, BC Powder. Stop ANY OVER THE COUNTER supplements until after surgery (Calcium + D3, Vitamin B12, Tart Cherry, Melatonin)  You may however, continue to take Tylenol /Oxycodone  if needed for pain up until the day of surgery.  Stop metFORMIN  (GLUCOPHAGE ) 2 days prior to surgery-Last dose will be on 03-11-24 Saturday  Continue taking all of your other prescription medications up until the day of surgery.  ON THE DAY OF SURGERY ONLY TAKE THESE MEDICATIONS WITH SIPS OF WATER: -ezetimibe  (ZETIA )  -metoprolol  succinate (TOPROL -XL)  -tamsulosin  (FLOMAX )   Use your ipratropium-albuterol   (DUONEB) at home the morning of surgery  No Alcohol for 24 hours before or after surgery.  No Smoking including e-cigarettes for 24 hours before surgery.  No chewable tobacco products for at least 6 hours before surgery.  No nicotine patches on the day of surgery.  Do not use any recreational drugs for at least a week (preferably 2 weeks) before your surgery.  Please be advised that the combination of cocaine and anesthesia may have negative outcomes, up to and including death. If you test positive for cocaine, your surgery will be cancelled.  On the morning of surgery brush your teeth with toothpaste and water, you may rinse your mouth with mouthwash if you wish. Do not swallow any toothpaste or mouthwash.  Use CHG Soap as directed on instruction sheet.  Do not wear jewelry, make-up, hairpins, clips or nail polish.  For welded (permanent) jewelry: bracelets, anklets, waist bands, etc.  Please have this removed prior to surgery.  If it is not removed, there is a chance that hospital personnel will need to cut it off on the day of surgery.  Do not wear lotions, powders, or perfumes.   Do not shave body hair from the neck down 48 hours before surgery.  Contact lenses, hearing aids and dentures may not be worn into surgery.  Do not bring valuables to the hospital. [facility] is not responsible for any missing/lost belongings or valuables.  Notify your doctor if there is any change in your medical condition (cold, fever, infection).  Wear comfortable clothing (specific to your surgery type) to the hospital.  After surgery, you can help prevent lung complications  by doing breathing exercises.  Take deep breaths and cough every 1-2 hours. Your doctor may order a device called an Incentive Spirometer to help you take deep breaths. When coughing or sneezing, hold a pillow firmly against your incision with both hands. This is called "splinting." Doing this helps protect your incision. It  also decreases belly discomfort.  If you are being admitted to the hospital overnight, leave your suitcase in the car. After surgery it may be brought to your room.  In case of increased patient census, it may be necessary for you, the patient, to continue your postoperative care in the Same Day Surgery department.  If you are being discharged the day of surgery, you will not be allowed to drive home. You will need a responsible individual to drive you home and stay with you for 24 hours after surgery.   If you are taking public transportation, you will need to have a responsible individual with you.  Please call the Pre-admissions Testing Dept. at [phone] if you have any questions about these instructions.  Surgery Visitation Policy:  Patients having surgery or a procedure may have two visitors.  Children under the age of 4 must have an adult with them who is not the patient.  Inpatient Visitation:    Visiting hours are 7 a.m. to 8 p.m. Up to four visitors are allowed at one time in a patient room. The visitors may rotate out with other people during the day.  One visitor age 54 or older may stay with the patient overnight and must be in the room by 8 p.m.    Pre-operative 5 CHG Bath Instructions   You can play a key role in reducing the risk of infection after surgery. Your skin needs to be as free of germs as possible. You can reduce the number of germs on your skin by washing with CHG (chlorhexidine  gluconate) soap before surgery. CHG is an antiseptic soap that kills germs and continues to kill germs even after washing.   DO NOT use if you have an allergy to chlorhexidine /CHG or antibacterial soaps. If your skin becomes reddened or irritated, stop using the CHG and notify one of our RNs at [phone].   Please shower with the CHG soap starting 4 days before surgery using the following schedule:     Please keep in mind the following:  DO NOT shave, including legs and  underarms, starting the day of your first shower.   You may shave your face at any point before/day of surgery.  Place clean sheets on your bed the day you start using CHG soap. Use a clean washcloth (not used since being washed) for each shower. DO NOT sleep with pets once you start using the CHG.   CHG Shower Instructions:  If you choose to wash your hair and private area, wash first with your normal shampoo/soap.  After you use shampoo/soap, rinse your hair and body thoroughly to remove shampoo/soap residue.  Turn the water OFF and apply about 3 tablespoons (45 ml) of CHG soap to a CLEAN washcloth.  Apply CHG soap ONLY FROM YOUR NECK DOWN TO YOUR TOES (washing for 3-5 minutes)  DO NOT use CHG soap on face, private areas, open wounds, or sores.  Pay special attention to the area where your surgery is being performed.  If you are having back surgery, having someone wash your back for you may be helpful. Wait 2 minutes after CHG soap is applied, then you may  rinse off the CHG soap.  Pat dry with a clean towel  Put on clean clothes/pajamas   If you choose to wear lotion, please use ONLY the CHG-compatible lotions on the back of this paper.     Additional instructions for the day of surgery: DO NOT APPLY any lotions, deodorants, cologne, or perfumes.   Put on clean/comfortable clothes.  Brush your teeth.  Ask your nurse before applying any prescription medications to the skin.      CHG Compatible Lotions   Aveeno Moisturizing lotion  Cetaphil Moisturizing Cream  Cetaphil Moisturizing Lotion  Clairol Herbal Essence Moisturizing Lotion, Dry Skin  Clairol Herbal Essence Moisturizing Lotion, Extra Dry Skin  Clairol Herbal Essence Moisturizing Lotion, Normal Skin  Curel Age Defying Therapeutic Moisturizing Lotion with Alpha Hydroxy  Curel Extreme Care Body Lotion  Curel Soothing Hands Moisturizing Hand Lotion  Curel Therapeutic Moisturizing Cream, Fragrance-Free  Curel Therapeutic  Moisturizing Lotion, Fragrance-Free  Curel Therapeutic Moisturizing Lotion, Original Formula  Eucerin Daily Replenishing Lotion  Eucerin Dry Skin Therapy Plus Alpha Hydroxy Crme  Eucerin Dry Skin Therapy Plus Alpha Hydroxy Lotion  Eucerin Original Crme  Eucerin Original Lotion  Eucerin Plus Crme Eucerin Plus Lotion  Eucerin TriLipid Replenishing Lotion  Keri Anti-Bacterial Hand Lotion  Keri Deep Conditioning Original Lotion Dry Skin Formula Softly Scented  Keri Deep Conditioning Original Lotion, Fragrance Free Sensitive Skin Formula  Keri Lotion Fast Absorbing Fragrance Free Sensitive Skin Formula  Keri Lotion Fast Absorbing Softly Scented Dry Skin Formula  Keri Original Lotion  Keri Skin Renewal Lotion Keri Silky Smooth Lotion  Keri Silky Smooth Sensitive Skin Lotion  Nivea Body Creamy Conditioning Oil  Nivea Body Extra Enriched Lotion  Nivea Body Original Lotion  Nivea Body Sheer Moisturizing Lotion Nivea Crme  Nivea Skin Firming Lotion  NutraDerm 30 Skin Lotion  NutraDerm Skin Lotion  NutraDerm Therapeutic Skin Cream  NutraDerm Therapeutic Skin Lotion  ProShield Protective Hand Cream  Provon moisturizing lotion   How to Use an Incentive Spirometer An incentive spirometer is a tool that measures how well you are filling your lungs with each breath. Learning to take long, deep breaths using this tool can help you keep your lungs clear and active. This may help to reverse or lessen your chance of developing breathing (pulmonary) problems, especially infection. You may be asked to use a spirometer: After a surgery. If you have a lung problem or a history of smoking. After a long period of time when you have been unable to move or be active. If the spirometer includes an indicator to show the highest number that you have reached, your health care provider or respiratory therapist will help you set a goal. Keep a log of your progress as told by your health care provider. What are  the risks? Breathing too quickly may cause dizziness or cause you to pass out. Take your time so you do not get dizzy or light-headed. If you are in pain, you may need to take pain medicine before doing incentive spirometry. It is harder to take a deep breath if you are having pain. How to use your incentive spirometer  Sit up on the edge of your bed or on a chair. Hold the incentive spirometer so that it is in an upright position. Before you use the spirometer, breathe out normally. Place the mouthpiece in your mouth. Make sure your lips are closed tightly around it. Breathe in slowly and as deeply as you can through your mouth, causing the  piston or the ball to rise toward the top of the chamber. Hold your breath for 3-5 seconds, or for as long as possible. If the spirometer includes a coach indicator, use this to guide you in breathing. Slow down your breathing if the indicator goes above the marked areas. Remove the mouthpiece from your mouth and breathe out normally. The piston or ball will return to the bottom of the chamber. Rest for a few seconds, then repeat the steps 10 or more times. Take your time and take a few normal breaths between deep breaths so that you do not get dizzy or light-headed. Do this every 1-2 hours when you are awake. If the spirometer includes a goal marker to show the highest number you have reached (best effort), use this as a goal to work toward during each repetition. After each set of 10 deep breaths, cough a few times. This will help to make sure that your lungs are clear. If you have an incision on your chest or abdomen from surgery, place a pillow or a rolled-up towel firmly against the incision when you cough. This can help to reduce pain while taking deep breaths and coughing. General tips When you are able to get out of bed: Walk around often. Continue to take deep breaths and cough in order to clear your lungs. Keep using the incentive spirometer  until your health care provider says it is okay to stop using it. If you have been in the hospital, you may be told to keep using the spirometer at home. Contact a health care provider if: You are having difficulty using the spirometer. You have trouble using the spirometer as often as instructed. Your pain medicine is not giving enough relief for you to use the spirometer as told. You have a fever. Get help right away if: You develop shortness of breath. You develop a cough with bloody mucus from the lungs. You have fluid or blood coming from an incision site after you cough. Summary An incentive spirometer is a tool that can help you learn to take long, deep breaths to keep your lungs clear and active. You may be asked to use a spirometer after a surgery, if you have a lung problem or a history of smoking, or if you have been inactive for a long period of time. Use your incentive spirometer as instructed every 1-2 hours while you are awake. If you have an incision on your chest or abdomen, place a pillow or a rolled-up towel firmly against your incision when you cough. This will help to reduce pain. Get help right away if you have shortness of breath, you cough up bloody mucus, or blood comes from your incision when you cough. This information is not intended to replace advice given to you by your health care provider. Make sure you discuss any questions you have with your health care provider. Document Revised: 05/21/2023 Document Reviewed: 05/21/2023 Elsevier Patient Education  2024 Elsevier Inc.  Preoperative Educational Videos for Total Hip, Knee and Shoulder Replacements  To better prepare for surgery, please view our videos that explain the physical activity and discharge planning required to have the best surgical recovery at [facility].  [email or web address]  Questions? Call [phone] or email  [email or web address]        Community Resource Directory to address health-related social needs:  [email or web address]

## 2024-03-10 ENCOUNTER — Ambulatory Visit: Attending: Cardiovascular Disease | Admitting: Nurse Practitioner

## 2024-03-10 DIAGNOSIS — I119 Hypertensive heart disease without heart failure: Secondary | ICD-10-CM | POA: Diagnosis not present

## 2024-03-10 DIAGNOSIS — J449 Chronic obstructive pulmonary disease, unspecified: Secondary | ICD-10-CM | POA: Diagnosis not present

## 2024-03-10 DIAGNOSIS — D649 Anemia, unspecified: Secondary | ICD-10-CM | POA: Diagnosis not present

## 2024-03-10 DIAGNOSIS — Z8546 Personal history of malignant neoplasm of prostate: Secondary | ICD-10-CM | POA: Diagnosis not present

## 2024-03-10 DIAGNOSIS — Z01810 Encounter for preprocedural cardiovascular examination: Secondary | ICD-10-CM

## 2024-03-10 DIAGNOSIS — N138 Other obstructive and reflux uropathy: Secondary | ICD-10-CM | POA: Diagnosis not present

## 2024-03-10 DIAGNOSIS — F329 Major depressive disorder, single episode, unspecified: Secondary | ICD-10-CM | POA: Diagnosis not present

## 2024-03-10 DIAGNOSIS — I6782 Cerebral ischemia: Secondary | ICD-10-CM | POA: Diagnosis not present

## 2024-03-10 DIAGNOSIS — J986 Disorders of diaphragm: Secondary | ICD-10-CM | POA: Diagnosis not present

## 2024-03-10 DIAGNOSIS — I272 Pulmonary hypertension, unspecified: Secondary | ICD-10-CM | POA: Diagnosis not present

## 2024-03-10 DIAGNOSIS — N401 Enlarged prostate with lower urinary tract symptoms: Secondary | ICD-10-CM | POA: Diagnosis not present

## 2024-03-10 DIAGNOSIS — I083 Combined rheumatic disorders of mitral, aortic and tricuspid valves: Secondary | ICD-10-CM | POA: Diagnosis not present

## 2024-03-10 DIAGNOSIS — Z8673 Personal history of transient ischemic attack (TIA), and cerebral infarction without residual deficits: Secondary | ICD-10-CM | POA: Diagnosis not present

## 2024-03-10 DIAGNOSIS — E119 Type 2 diabetes mellitus without complications: Secondary | ICD-10-CM | POA: Diagnosis not present

## 2024-03-10 DIAGNOSIS — E785 Hyperlipidemia, unspecified: Secondary | ICD-10-CM | POA: Diagnosis not present

## 2024-03-10 DIAGNOSIS — Z7984 Long term (current) use of oral hypoglycemic drugs: Secondary | ICD-10-CM | POA: Diagnosis not present

## 2024-03-10 DIAGNOSIS — Z79899 Other long term (current) drug therapy: Secondary | ICD-10-CM | POA: Diagnosis not present

## 2024-03-10 DIAGNOSIS — I700 Atherosclerosis of aorta: Secondary | ICD-10-CM | POA: Diagnosis not present

## 2024-03-10 DIAGNOSIS — M1612 Unilateral primary osteoarthritis, left hip: Secondary | ICD-10-CM | POA: Diagnosis present

## 2024-03-10 DIAGNOSIS — Z87891 Personal history of nicotine dependence: Secondary | ICD-10-CM | POA: Diagnosis not present

## 2024-03-10 DIAGNOSIS — L4050 Arthropathic psoriasis, unspecified: Secondary | ICD-10-CM | POA: Diagnosis not present

## 2024-03-10 LAB — URINALYSIS, ROUTINE W REFLEX MICROSCOPIC
Bilirubin Urine: NEGATIVE
Glucose, UA: NEGATIVE mg/dL
Hgb urine dipstick: NEGATIVE
Ketones, ur: NEGATIVE mg/dL
Leukocytes,Ua: NEGATIVE
Nitrite: NEGATIVE
Protein, ur: NEGATIVE mg/dL
Specific Gravity, Urine: 1.006 (ref 1.005–1.030)
pH: 5 (ref 5.0–8.0)

## 2024-03-10 NOTE — Progress Notes (Signed)
 NAME: [name]  H&P Date: 03/10/2024   Procedure Date: 03/14/2024  Chief Complaint: left hip pain  HPI  Brian Callahan is a 83 y.o. male who has severe Left hip pain.  Patient reports a longstanding history of left hip discomfort.  He states that it has gotten worse over the last 5 months, and has greatly affected his ability to ambulate long distances and perform his ADLs as he would like.  He states that his pain is made worse anytime that he attempts to ambulate or stand for a prolonged period of time.  He presents today utilizing a wheelchair for ambulation assistance.  He does report having some numbness and tingling in his leg, but does report a history of neuropathy.  He states that the pain localizes along the posterior and anterior portion of his groin and is made worse with any attempted hip movement. He has failed conservative treatment including activity modification, ambulation assistive device use, Tylenol  and intra-articular corticosteroids.   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  Patient does report a significant history of chronic conditions.  Patient has noted to have valvular cardiac issues as well as significant peripheral vascular disease.  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  No previous history of DVTs or clots.  He is noted to be a diabetic, last A1c was at 6.6.  No previous surgeries on this hip.  He also does report a significant history of multiple GI bleeds secondary to potential use of NSAIDs versus opioid medications.  He has had an IVC filter placed.  Social Hx: Patient lives at home with his wife will be helping look after him postoperatively.  He does admit to 3 or 4 standard alcoholic drinks per day.  Denies any illicit drug use.  No nicotine use or smoking.  States that he quit smoking about 33 years ago.   Medications & Allergies  Allergies: Allergies  Allergen Reactions  . Imdur [Isosorbide  Mononitrate] Headache  . Shellfish Containing Products Other (See Comments)    gout  . Trazodone Hcl Diarrhea    Home Medicines: Current Outpatient Medications on File Prior to Visit  Medication Sig Dispense Refill  . acetaminophen  (TYLENOL ) 500 mg capsule Take 1,000 mg by mouth every 6 (six) hours as needed for Pain    . ANTIOX #8/OM3/DHA/EPA/LUT/ZEAX (PRESERVISION AREDS 2, OMEGA-3, ORAL) Take 1 tablet by mouth once daily    . clobetasoL  (TEMOVATE ) 0.05 % cream     . COSENTYX UNOREADY PEN 300 mg/2 mL PnIj Inject 300 mg as directed    . cyanocobalamin (VITAMIN B12) 1000 MCG tablet Take 1 tablet (1,000 mcg total) by mouth once daily 90 tablet 3  . enalapril  (VASOTEC ) 5 MG tablet TAKE 1 TABLET BY MOUTH TWICE  DAILY 200 tablet 2  . ezetimibe  (ZETIA ) 10 mg tablet TAKE 1 TABLET BY MOUTH ONCE  DAILY 100 tablet 2  . imipramine  (TOFRANIL ) 25 MG tablet Take 1 tablet (25 mg total) by mouth at bedtime 90 tablet 3  . ipratropium-albuteroL  (DUO-NEB) nebulizer solution Take 3 mLs by nebulization 2 (two) times daily for 360 days 360 mL 11  . melatonin 5 mg Cap Take 1 capsule by mouth at bedtime as needed    . metFORMIN  (GLUCOPHAGE ) 500 MG tablet TAKE 1 TABLET BY MOUTH TWICE  DAILY WITH MEALS 200 tablet 2  . metoprolol  SUCCinate (TOPROL -XL) 50 MG XL tablet TAKE 1 AND 1/2 TABLETS BY  MOUTH  ONCE DAILY 150 tablet 2  . mirtazapine (REMERON) 7.5 MG tablet Take 1 tablet (7.5 mg total) by mouth at bedtime 30 tablet 2  . oxyCODONE  (ROXICODONE ) 5 MG immediate release tablet Take 0.5-1 tablets (2.5-5 mg total) by mouth every 12 (twelve) hours as needed for up to 5 days 15 tablet 0  . tamsulosin  (FLOMAX ) 0.4 mg capsule Take 0.4 mg by mouth 2 (two) times daily Take 30 minutes after same meal each day.    . tildrakizumab-asmn (ILUMYA) 100 mg/mL Syrg Inject subcutaneously    . VIT C/CHERRY & CELERY EX/GRP E (TART CHERRY ORAL) Take by mouth.     No current facility-administered medications on file prior to visit.     Medical / Surgical History   Past Medical History:  Diagnosis Date  . Abdominal pain, epigastric 08/06/2016  . Anemia    R/t GIB  . 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 Esophagitis, reflux 11/08/2017  . Eye problems   . Gastroesophageal reflux disease without esophagitis 09/05/2015  . GI bleed 04/15/2012  . H/O gastric ulcer 09/05/2015  . H/O [facility] spotted fever    hospitalization  . Hard of hearing   . History of gastritis 09/05/2015  . Hyperglycemia   . Hyperlipidemia   . Hypertension   . Psoriasis    Per dermatogy. On Humira.     Past Surgical History:  Procedure Laterality Date  . Colectomy, partial 1998. Dr. cerame. Due to obstruction. Non-cancerous  11/15/1996  . Bursitis of his knee  2009  . GI bleed   2013  . EGD  12/05/2013   No repeat per RTE  . EGD  10/14/2016   Gastritis: No repeat per RTE  . CHOLECYSTECTOMY  10/18/2017   Dr Lucas Catchings  . COLONOSCOPY  09/27/2019   Diverticulosis/PHx CP/No Repeat due to age/TKT  . EGD  09/27/2019   Gastritis/No Yeast found/No Repeat/TKT  . 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 BIOPSY PROSTATE NEEDLE/PUNCH     [place] [facility], 2 years ago. No cancer  . BPH    . COLONOSCOPY  12/05/2013, 07/11/2012, 10/31/1996   Dr. FABIENE Holmes @ [facility] -  Adenomatous Polyp: CBF 11/2018: Recall ltr mailed   . HERNIA REPAIR     x 2 in the mid 90's  . neck/spine surgery     2007  . SURGERY SCROTAL / TESTICULAR     surgery for fibrous growth     Physical Exam   Ht:170.2 cm (5' 7) Wt:88 kg (194 lb) BMI: Body mass index is 30.38 kg/m.  General/Constitutional: No apparent distress: well-nourished and well developed. Eyes: Pupils equal, round with synchronous movement. Lymphatic: No palpable adenopathy. Respiratory: Patient has good chest rise and fall with inspiration and expiration.  All lung fields are clear to auscultation bilaterally.  There is no Rales, rhonchi or wheezes  appreciated. Cardiovascular: Upon auscultation there is a regular rate and rhythm without any murmurs, rubs, gallops or heaves appreciated.  There does not appear to be any swelling down the lower extremities.  Posterior tibial pulses appreciated bilaterally, 2+. Integumentary: No impressive skin lesions present, except as noted in detailed exam. Neuro/Psych: Normal mood and affect, oriented to person, place and time. Musculoskeletal: see exam below  Left hip exam Left Hip:  Upon inspection of the patient's left hip, there does not appear to be any noticeable swelling, erythema or gross deformity.  Of note, patient noted to have significant peripheral vascular  changes with underlying rubor and some skin breakdown across his bilateral lower extremities.   Pelvic tilt:  Negative  Limb lengths:  Equal with the patient standing  Soft tissue swelling: Negative  Erythema:  Negative  Crepitance:  Negative  Tenderness:  Greater trochanter is nontender to palpation. Moderate pain is elicited by axial compression or extremes of rotation.  Atrophy:  No atrophy.  Okay to Fair hip flexor and abductor strength.  Range of Motion:  EXT/FLEX: -/85    ADD/ABD: -/20    IR/ER: 10/25  Patient is neurovascularly intact to all dermatomes extending down there Left lower extremity to all dermatomes.  Posterior tibial pulses were appreciated, 2+.  Imaging  Hip Imaging: None ordered today.  Previous images from 10/29/2023 were reviewed of the patient's left hip.  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  Subchondral changes are appreciated.  Osteophyte formation is present.  Noted to have severe calcific changes on x-ray to descending vasculature.  No signs of underlying AVN.  No fractures, lytic lesions or gross deformities appreciated on films.    Assesment and Plan  Left Hip DJD  I have recommended that Brian Callahan undergo left total  hip replacement.  Consents has been signed.  The risks, benefits, prognosis and alternatives including but not limited to DVT, PE, infection, neurovascular injury, failure of the procedure and death were explained to the patient and he is willing to proceed with surgery as described to him by myself.  Plan will be for post operative admission of at least 1 midnight for pain control and PT.  He will be managed with DVT prophylaxis, antibiotics preoperatively for 24 hours and aggressive in patient rehab.   Pre, intra and post op interventions were discussed. Patient has good understanding   Medication Reconciliation was performed. Discussed cessation of diabetes medications, vitamins and supplements.    A total of 50 minutes was spent reviewing patient's charts, medical reconciliation, discussing/educating the patient about surgical interventions, and answering any questions provided by the patient.  JOSHUA DALLAS KOYANAGI, [place] [facility] 03/10/2024

## 2024-03-10 NOTE — Progress Notes (Signed)
 Virtual Visit via Telephone Note   Because of Brian Callahan co-morbid illnesses, he is at least at moderate risk for complications without adequate follow up.  This format is felt to be most appropriate for this patient at this time.  Due to technical limitations with video connection (technology), today's appointment will be conducted as an audio only telehealth visit, and Brian Callahan verbally agreed to proceed in this manner.   All issues noted in this document were discussed and addressed.  No physical exam could be performed with this format.  Evaluation Performed:  Preoperative cardiovascular risk assessment _____________   Date:  03/10/2024   Patient ID:  Brian Callahan, DOB [birth year], MRN [id number] Patient Location:  Home Provider location:   Office  Primary Care Provider:  Auston Reyes BIRCH, MD Primary Cardiologist:  Evalene Lunger, MD  Chief Complaint / Patient Profile   83 y.o. y/o male with a h/o chest pain with normal coronary arteries on cardiac catheterization in 2010, aortic insufficiency, gastritis, GI bleed, mesenteric artery embolization, hypertension, hyperlipidemia, type 2 diabetes, prostate cancer s/p radiation, COPD, former tobacco use, and alcohol use who is pending total hip arthroplasty, posterior approach on 03/14/2024 with Dr. Norleen Maltos of [facility] and presents today for telephonic preoperative cardiovascular risk assessment.  History of Present Illness    Brian Callahan is a 83 y.o. male who presents via audio/video conferencing for a telehealth visit today. Pt was last seen in cardiology clinic on 12/10/2023 by Lonni Meager, NP. At that time Brian Callahan was doing well.  Repeat echocardiogram was stable, he was cleared for surgery at the time.  The patient is now pending procedure as outlined above. Since his last visit, he has been stable from a cardiac standpoint.  His activity is limited in the setting of hip pain.  He  denies chest pain, palpitations, dyspnea, pnd, orthopnea, n, v, dizziness, syncope, edema, weight gain, or early satiety. All other systems reviewed and are otherwise negative except as noted above.   Past Medical History    Past Medical History:  Diagnosis Date   Anemia    Aortic insufficiency    a. 06/2017 Echo: EF 50% with mild LVH, nl RV function, moderate AI, and mild MR/TR/PR.   Benign fibroma of prostate    BPH with obstruction/lower urinary tract symptoms    Chest pain    a. 2010 Cath ([facility]): reportedly nl; b. 06/2017 [facility] Brian Callahan): EF 63%, mild inf wall ischemia->med rx.   Chronic dyspnea    Chronic GI bleeding    COPD (chronic obstructive pulmonary disease) (HCC)    Diaphragm paralysis    Right side   DM (diabetes mellitus), type 2 (HCC)    Elevated PSA    Embolus of mesenteric artery (HCC)    Encounter for long-term (current) use of other high-risk medications    GERD (gastroesophageal reflux disease)    gastritis   Gout    H/O hernia repair mid-90s   x2   History of elevated PSA    History of prostate cancer    History of [place] spotted fever    HLD (hyperlipidemia)    HTN (hypertension)    Hyperglycemia    Hyperuricemia    Incomplete bladder emptying    Macular degeneration    Obesity    Osteoarthritis of left hip    Psoriasis    S/P insertion of IVC (inferior vena caval) filter    Urinary frequency  Past Surgical History:  Procedure Laterality Date   APPENDECTOMY     BACK SURGERY     neck and spine 2007   BIOPSY  09/15/2023   Procedure: BIOPSY;  Surgeon: Aundria, Ladell POUR, MD;  Location: [facility] ENDOSCOPY;  Service: Gastroenterology;;   CERVICAL SPINE SURGERY  2007   CHOLECYSTECTOMY N/A 10/18/2017   Procedure: LAPAROSCOPIC CHOLECYSTECTOMY;  Surgeon: Rodolph Romano, MD;  Location: [facility] ORS;  Service: General;  Laterality: N/A;   COLECTOMY  1998   partial, due to obstruction   COLONOSCOPY     lost 10 units of blood   COLONOSCOPY WITH  PROPOFOL  N/A 09/27/2019   Procedure: COLONOSCOPY WITH PROPOFOL ;  Surgeon: [name], Ladell POUR, MD;  Location: [facility] ENDOSCOPY;  Service: Gastroenterology;  Laterality: N/A;   CYSTOSCOPY WITH INSERTION OF UROLIFT N/A 01/23/2020   Procedure: CYSTOSCOPY WITH INSERTION OF UROLIFT;  Surgeon: Twylla Glendia BROCKS, MD;  Location: [facility] ORS;  Service: Urology;  Laterality: N/A;   ESOPHAGOGASTRODUODENOSCOPY N/A 10/14/2016   Procedure: ESOPHAGOGASTRODUODENOSCOPY (EGD);  Surgeon: Lamar ONEIDA Holmes, MD;  Location: [facility] ENDOSCOPY;  Service: Endoscopy;  Laterality: N/A;   ESOPHAGOGASTRODUODENOSCOPY (EGD) WITH PROPOFOL  N/A 09/27/2019   Procedure: ESOPHAGOGASTRODUODENOSCOPY (EGD) WITH PROPOFOL ;  Surgeon: [name], Ladell POUR, MD;  Location: [facility] ENDOSCOPY;  Service: Gastroenterology;  Laterality: N/A;   EYE SURGERY     bilateral cataracts   FLEXIBLE SIGMOIDOSCOPY N/A 09/15/2023   Procedure: FLEXIBLE SIGMOIDOSCOPY;  Surgeon: [name], Ladell POUR, MD;  Location: [facility] ENDOSCOPY;  Service: Gastroenterology;  Laterality: N/A;  DM   HERNIA REPAIR Bilateral mid-90s   x2  inguinal   IVC FILTER INSERTION N/A 03/06/2024   Procedure: IVC FILTER INSERTION;  Surgeon: Marea Selinda RAMAN, MD;  Location: [facility] INVASIVE CV LAB;  Service: Cardiovascular;  Laterality: N/A;   POLYPECTOMY  09/15/2023   Procedure: POLYPECTOMY;  Surgeon: Aundria, Ladell POUR, MD;  Location: [facility] ENDOSCOPY;  Service: Gastroenterology;;   PROSTATE BIOPSY  2013   SURGERY SCROTAL / TESTICULAR     for fibrous growth    Allergies  Allergies  Allergen Reactions   Isosorbide Nitrate Other (See Comments)    unkn   Shellfish Allergy Other (See Comments)    gout   Trazodone And Nefazodone Diarrhea    Home Medications    Prior to Admission medications   Medication Sig Start Date End Date Taking? Authorizing Provider  acetaminophen  (TYLENOL ) 500 MG tablet Take 500-1,000 mg by mouth every 6 (six) hours as needed (for headache/minor pain.).    [provider]   Calcium Carb-Cholecalciferol (CALCIUM-VITAMIN D3 PO) Take 1 tablet by mouth daily at 6 (six) AM.    [provider]  clobetasol  ointment (TEMOVATE ) 0.05 % Apply 1 application  topically daily as needed (psoriasis). 02/01/19   [provider]  cyanocobalamin (VITAMIN B12) 500 MCG tablet Take 1,000 mcg by mouth daily.    [provider]  enalapril  (VASOTEC ) 5 MG tablet Take 1 tablet (5 mg total) by mouth 2 (two) times daily. 11/06/19   Gollan, Timothy J, MD  ezetimibe  (ZETIA ) 10 MG tablet Take 10 mg by mouth every morning.    [provider]  FEROSUL 325 (65 Fe) MG tablet Take 325 mg by mouth every morning. 04/05/23   [provider]  imipramine  (TOFRANIL ) 25 MG tablet Take 25 mg by mouth at bedtime.    [provider]  ipratropium-albuterol  (DUONEB) 0.5-2.5 (3) MG/3ML SOLN Take 3 mLs by nebulization 2 (two) times daily.    [provider]  melatonin 5  MG TABS Take 5 mg by mouth at bedtime as needed.    [provider]  metFORMIN  (GLUCOPHAGE ) 500 MG tablet Take 1 tablet by mouth 2 (two) times daily. 11/07/20   [provider]  metoprolol  succinate (TOPROL -XL) 50 MG 24 hr tablet Take 1.5 tablets by mouth every morning. 09/05/20   [provider]  Misc Natural Products (TART CHERRY ADVANCED PO) Take 12 mg by mouth every other day.    [provider]  Multiple Vitamins-Minerals (PRESERVISION AREDS 2 PO) Take 1 capsule by mouth in the morning and at bedtime.    [provider]  OXYCODONE  ER PO Take 0.5-1 tablets by mouth every 12 (twelve) hours as needed.    [provider]  Secukinumab (COSENTYX, 300 MG DOSE, [place]) Inject 300 mg into the skin every 30 (thirty) days.    [provider]  tamsulosin  (FLOMAX ) 0.4 MG CAPS capsule TAKE 1 CAPSULE BY MOUTH TWICE  DAILY Patient taking differently: 0.4 mg 2 (two) times daily. TAKE 1 CAPSULE BY MOUTH  TWICE DAILY 05/24/23   Twylla Glendia BROCKS, MD     Physical Exam    Vital Signs:  Brian Callahan does not have vital signs available for review today.  Given telephonic nature of communication, physical exam is limited. AAOx3. NAD. Normal affect.  Speech and respirations are unlabored.  Accessory Clinical Findings    None  Assessment & Plan    1.  Preoperative Cardiovascular Risk Assessment:  According to the Revised Cardiac Risk Index (RCRI), his Perioperative Risk of Major Cardiac Event is (%): 0.4. His Functional Capacity in METs is: 4.4 according to the [facility] Activity Status Index (DASI).Therefore, based on ACC/AHA guidelines, patient would be at acceptable risk for the planned procedure without further cardiovascular testing.  The patient was advised that if he develops new symptoms prior to surgery to contact our office to arrange for a follow-up visit, and he verbalized understanding.   A copy of this note will be routed to requesting surgeon.  Time:   Today, I have spent 7 minutes with the patient with telehealth technology discussing medical history, symptoms, and management plan.     Damien BROCKS Braver, NP  03/10/2024, 3:19 PM

## 2024-03-13 ENCOUNTER — Encounter: Payer: Self-pay | Admitting: Surgery

## 2024-03-13 DIAGNOSIS — I5189 Other ill-defined heart diseases: Secondary | ICD-10-CM

## 2024-03-13 NOTE — Progress Notes (Signed)
 Perioperative / Anesthesia Services  Pre-Admission Testing Clinical Review / Pre-Operative Anesthesia Consult  Date: 03/13/24  PATIENT DEMOGRAPHICS: Name: Brian Callahan DOB: 07-09-1941 MRN:   [id number]  Note: Available PAT nursing documentation and vital signs have been reviewed. Clinical nursing staff has updated patient's PMH/PSHx, current medication list, and drug allergies/intolerances to ensure complete and comprehensive history available to assist care teams in MDM as it pertains to the aforementioned surgical procedure and anticipated anesthetic course. Extensive review of available clinical information personally performed. [name] PMH and PSHx updated with any diagnoses/procedures that  may have been inadvertently omitted during his intake with the pre-admission testing department's nursing staff.  PLANNED SURGICAL PROCEDURE(S):   Case: [id number] Date/Time: 03/14/24 0715   Procedure: ARTHROPLASTY, HIP, TOTAL,POSTERIOR APPROACH (Left: Hip)   Anesthesia type: Choice   Diagnosis: Primary osteoarthritis of left hip [M16.12]   Pre-op diagnosis: Primary osteoarthritis of left hip.   Location: [facility] OR ROOM 02 / [facility] ORS FOR ANESTH[facility]IA [facility]   Surgeons: Edie Norleen PARAS, MD        CLINICAL DISCUSSION: Brian Callahan is a 83 y.o. male who is submitted for pre-surgical anesthesia review and clearance prior to him undergoing the above procedure. Patient is a Former Smoker (2.5 pack years; quit 09/1990). 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  Patient is followed by cardiology (Gollan, MD). He was  last seen in the cardiology clinic on 12/10/2023; notes reviewed. At the time of his clinic visit, patient doing well overall from a cardiovascular perspective. Patient denied any chest pain, shortness of breath, PND, orthopnea, palpitations, significant peripheral edema, weakness, fatigue, vertiginous symptoms, or presyncope/syncope. Patient with a past medical history significant for cardiovascular diagnoses. Documented physical exam was grossly benign, providing no evidence of acute exacerbation and/or decompensation of the patient's known cardiovascular conditions.  Of note, complete records regarding patient's cardiovascular history unavailable for review at time of consult.  Information gathered from patient report and from notes provided by his local specialty care teams.  Patient reported to have undergone diagnostic LEFT heart catheterization in 2010 at [facility].  Study reportedly revealed normal coronary anatomy with no evidence of obstructive coronary artery disease.  Most recent myocardial perfusion imaging study was performed on 07/15/2017 revealing a  normal left ventricular systolic function with an EF of 63%.  There were no regional wall motion abnormalities.  No artifact or left ventricular cavity size enlargement appreciated on review of imaging. SPECT images demonstrated no evidence of stress-induced myocardial ischemia or arrhythmia; no scintigraphic evidence of scar.  Study determined to be normal and low risk.  MRI imaging of the brain performed on 05/29/2022 revealed evidence of a small chronic RIGHT cerebellar infarct.  This was in addition to small vessel ischemic disease and cerebral atrophy.  Patient unclear when the neurological event occurred.  No significant deficits following neurological insult per patient report.  Blood pressure well controlled at 106/60 mmHg on currently prescribed ACEi (enalapril ) and beta-blocker (metoprolol  succinate) therapies.   Patient is on ezetimibe  for his HLD diagnosis and ASCVD prevention.  T2DM well-controlled on currently prescribed regimen; last HgbA1c was 6.6% when checked on 12/09/2023. He does not have an OSAH diagnosis. Patient is able to complete all of his  ADL/IADLs without cardiovascular limitation.  Per the DASI, patient is able to achieve at least 4 METS of physical activity without experiencing any significant degree of angina/anginal equivalent symptoms.  No changes were made to his medication regimen during his visit with cardiology.  Given patient's history of murmur on examination, the decision was made to repeat patient's echocardiogram.  Patient scheduled to follow-up with outpatient cardiology in 1 year  or sooner if needed.  Since patient was last seen by cardiology, he has undergone the recommended functional study to further assess cardiac murmur noted on examination.  Most recent TTE performed on 12/23/2023 revealed a normal left ventricular systolic function with an EF of 55-60%. There were no regional wall motion abnormalities. Left ventricular diastolic Doppler parameters consistent with abnormal relaxation (G1DD). Right ventricular size and function normal. RVSP = 52.9 mmHg consistent with patient's known pulmonary hypertension diagnosis.  There was mild mitral, tricuspid, and aortic valve regurgitation.  All transvalvular gradients were noted to be normal providing no evidence of hemodynamically significant valvular stenosis.  Aortic root mildly dilated measuring up to 41 mm.  Brian Callahan is scheduled for an elective ARTHROPLASTY, HIP, TOTAL,POSTERIOR APPROACH (Left: Hip) on 03/14/2024 with Dr. Norleen Maltos, MD.  Given patient's past medical history significant for cardiovascular diagnoses, presurgical cardiac clearance was sought by the PAT team.  Per cardiology, according to the Revised Cardiac Risk Index (RCRI), his Perioperative Risk of Major Cardiac Event is (%): 0.4. His Functional Capacity  in METs is: 4.4 according to the [facility] Activity Status Index (DASI). Therefore, based on ACC/AHA guidelines, patient would be at ACCEPTABLE risk for the planned procedure without further cardiovascular testing.  In review of his medication reconciliation, the patient is not noted to be taking any type of anticoagulation or antiplatelet therapies that would need to be held during his perioperative course.  Patient denies previous perioperative complications with anesthesia in the past. In review his EMR, it is noted that patient underwent a general anesthetic course here at [facility] ([facility]) in 08/2023 without documented complications.   MOST RECENT VITAL SIGNS:    03/09/2024   11:50 AM 03/06/2024    1:30 PM 03/06/2024    1:15 PM  Vitals with BMI  Height 5' 8    Weight 195 lbs 11 oz    BMI 29.76    Systolic 168 127 859  Diastolic 68 60 62  Pulse 101 74 71   PROVIDERS/SPECIALISTS: NOTE: Primary physician provider listed below. Patient may have been seen by APP or partner within same practice.   PROVIDER ROLE / SPECIALTY LAST OV  Poggi, Norleen PARAS, MD Orthopedics (Surgeon) 03/10/2024  Auston Reyes BIRCH, MD Primary Care Provider 12/09/2023  Perla Lye, MD Cardiology 12/10/2023; preop APP call 03/10/2024  Marea Mayo, MD Vascular Surgery 12/03/2023   ALLERGIES: Allergies  Allergen Reactions   Isosorbide Nitrate Other (See Comments)    unkn   Shellfish Allergy Other (See Comments)    gout   Trazodone And Nefazodone Diarrhea    CURRENT HOME MEDICATIONS: No current facility-administered medications for this encounter.    acetaminophen  (TYLENOL ) 500 MG tablet   Calcium Carb-Cholecalciferol (CALCIUM-VITAMIN D3 PO)   clobetasol  ointment (TEMOVATE ) 0.05 %   cyanocobalamin (VITAMIN B12) 500 MCG tablet   enalapril  (VASOTEC ) 5 MG tablet   ezetimibe  (ZETIA ) 10 MG tablet   FEROSUL 325 (65 Fe) MG tablet   imipramine  (TOFRANIL ) 25 MG tablet    ipratropium-albuterol  (DUONEB) 0.5-2.5 (3) MG/3ML SOLN  melatonin 5 MG TABS   metFORMIN  (GLUCOPHAGE ) 500 MG tablet   metoprolol  succinate (TOPROL -XL) 50 MG 24 hr tablet   Misc Natural Products (TART CHERRY ADVANCED PO)   Multiple Vitamins-Minerals (PRESERVISION AREDS 2 PO)   OXYCODONE  ER PO   Secukinumab (COSENTYX, 300 MG DOSE, [place])   tamsulosin  (FLOMAX ) 0.4 MG CAPS capsule   HISTORY: Past Medical History:  Diagnosis Date   Anemia    Aortic atherosclerosis (HCC)    Aortic insufficiency    a. 06/2017 Echo: EF 50% with mild LVH, nl RV function, moderate AI, and mild MR/TR/PR.   Aortic root dilatation (HCC)    Benign fibroma of prostate    BPH with obstruction/lower urinary tract symptoms    Cerebellar infarct [facility])    Cerebral microvascular disease    Chest pain    a. 2010 Cath ([facility]): reportedly nl; b. 06/2017 [facility]): EF 63%, mild inf wall ischemia->med rx.   Chronic dyspnea    Chronic GI bleeding    Colon polyps    COPD (chronic obstructive pulmonary disease) (HCC)    Depression    Diaphragm paralysis (RIGHT)    Diastolic dysfunction    Diverticulosis    DM (diabetes mellitus), type 2 (HCC)    Elevated PSA    Embolus of mesenteric artery (HCC)    Gastritis    GERD (gastroesophageal reflux disease)    Gout    H/O hernia repair mid-90s   x2   History of prostate cancer s/p IMRT    History of [place] spotted fever    HLD (hyperlipidemia)    HTN (hypertension)    Incomplete bladder emptying    Insomnia    a.) takes melatonin PRN   Long term current use of immunosuppressive drug    a.) IL-17A for psoriatic arthritis (secukinumab)   Macular degeneration    Obesity    Osteoarthritis of hip    Psoriasis    Psoriatic arthritis (HCC)    a.) on IL-17A inhibitor (secukinumab)   Pulmonary hypertension (HCC)    S/P insertion of IVC (inferior vena caval) filter    SBO (small bowel obstruction); s/p partial colectomy 1998   Status post bilateral cataract  extraction    Urinary frequency    Past Surgical History:  Procedure Laterality Date   APPENDECTOMY     BACK SURGERY     neck and spine 2007   BIOPSY  09/15/2023   Procedure: BIOPSY;  Surgeon: Aundria, Ladell POUR, MD;  Location: [facility] ENDOSCOPY;  Service: Gastroenterology;;   CATARACT EXTRACTION, BILATERAL Bilateral    CERVICAL SPINE SURGERY  2007   CHOLECYSTECTOMY N/A 10/18/2017   Procedure: LAPAROSCOPIC CHOLECYSTECTOMY;  Surgeon: Rodolph Romano, MD;  Location: [facility] ORS;  Service: General;  Laterality: N/A;   COLONOSCOPY     lost 10 units of blood   COLONOSCOPY WITH PROPOFOL  N/A 09/27/2019   Procedure: COLONOSCOPY WITH PROPOFOL ;  Surgeon: [name], Ladell POUR, MD;  Location: [facility] ENDOSCOPY;  Service: Gastroenterology;  Laterality: N/A;   CYSTOSCOPY WITH INSERTION OF UROLIFT N/A 01/23/2020   Procedure: CYSTOSCOPY WITH INSERTION OF UROLIFT;  Surgeon: Twylla Glendia BROCKS, MD;  Location: [facility] ORS;  Service: Urology;  Laterality: N/A;   ESOPHAGOGASTRODUODENOSCOPY N/A 10/14/2016   Procedure: ESOPHAGOGASTRODUODENOSCOPY (EGD);  Surgeon: Lamar ONEIDA Holmes, MD;  Location: [facility] ENDOSCOPY;  Service: Endoscopy;  Laterality: N/A;   ESOPHAGOGASTRODUODENOSCOPY (EGD) WITH PROPOFOL  N/A 09/27/2019   Procedure: ESOPHAGOGASTRODUODENOSCOPY (EGD) WITH PROPOFOL ;  Surgeon: [name], Ladell POUR, MD;  Location: [facility] ENDOSCOPY;  Service: Gastroenterology;  Laterality: N/A;   FLEXIBLE SIGMOIDOSCOPY N/A 09/15/2023   Procedure: FLEXIBLE SIGMOIDOSCOPY;  Surgeon: [name], Ladell POUR, MD;  Location: [facility] ENDOSCOPY;  Service: Gastroenterology;  Laterality: N/A;  DM   HERNIA REPAIR Bilateral mid-90s   x2  inguinal   IVC FILTER INSERTION N/A 03/06/2024   Procedure: IVC FILTER INSERTION;  Surgeon: Marea Selinda RAMAN, MD;  Location: [facility] INVASIVE CV LAB;  Service: Cardiovascular;  Laterality: N/A;   PARTIAL COLECTOMY  1998   POLYPECTOMY  09/15/2023   Procedure: POLYPECTOMY;  Surgeon: Aundria, Ladell POUR, MD;  Location: [facility] ENDOSCOPY;   Service: Gastroenterology;;   PROSTATE BIOPSY  2013   SURGERY SCROTAL / TESTICULAR     for fibrous growth   Family History  Problem Relation Age of Onset   Kidney failure Brother    Prostate cancer Neg Hx    Social History   Tobacco Use   Smoking status: Former    Current packs/day: 0.00    Average packs/day: 0.3 packs/day for 10.0 years (2.5 ttl pk-yrs)    Types: Cigarettes    Start date: 10/11/1980    Quit date: 10/12/1990    Years since quitting: 33.4    Passive exposure: Past   Smokeless tobacco: Never   Tobacco comments:    Quit 20 years ago  Substance Use Topics   Alcohol use: Yes    Alcohol/week: 14.0 standard drinks of alcohol    Types: 14 Shots of liquor per week    Comment: 2 whiskeys per night to help him sleep   LABS:  Preadmission on 03/14/2024  Component Date Value Ref Range Status   Color, Urine 03/10/2024 STRAW (A)  YELLOW Final   APPearance 03/10/2024 CLEAR (A)  CLEAR Final   Specific Gravity, Urine 03/10/2024 1.006  1.005 - 1.030 Final   pH 03/10/2024 5.0  5.0 - 8.0 Final   Glucose, UA 03/10/2024 NEGATIVE  NEGATIVE mg/dL Final   Hgb urine dipstick 03/10/2024 NEGATIVE  NEGATIVE Final   Bilirubin Urine 03/10/2024 NEGATIVE  NEGATIVE Final   Ketones, ur 03/10/2024 NEGATIVE  NEGATIVE mg/dL Final   Protein, ur 91/84/7974 NEGATIVE  NEGATIVE mg/dL Final   Nitrite 91/84/7974 NEGATIVE  NEGATIVE Final   Leukocytes,Ua 03/10/2024 NEGATIVE  NEGATIVE Final   Performed at [facility], [address], [place], [place] [address]  Hospital Outpatient Visit on 03/09/2024  Component Date Value Ref Range Status   ABO/RH(D) 03/09/2024 O NEG   Final   Antibody Screen 03/09/2024 NEG   Final   Sample Expiration 03/09/2024 03/23/2024,2359   Final   Extend sample reason 03/09/2024    Final                   Value:NO TRANSFUSIONS OR PREGNANCY IN THE PAST 3 MONTHS Performed at [facility], [address] [place], [place] [address]    MRSA, PCR 03/09/2024  NEGATIVE  NEGATIVE Final   Staphylococcus aureus 03/09/2024 NEGATIVE  NEGATIVE Final   Comment: (NOTE) The Xpert SA Assay (FDA approved for NASAL specimens in patients 80 years of age and older), is one component of a comprehensive surveillance program. It is not intended to diagnose infection nor to guide or monitor treatment. Performed at [facility], [address] [place], [place] [address]    WBC 03/09/2024 4.5  4.0 - 10.5 K/uL Final   RBC 03/09/2024 3.79 (L)  4.22 - 5.81 MIL/uL Final   Hemoglobin 03/09/2024 13.0  13.0 - 17.0 g/dL Final   HCT 91/85/7974 38.2 (L)  39.0 - 52.0 % Final   MCV 03/09/2024 100.8 (H)  80.0 - 100.0 fL Final   MCH 03/09/2024 34.3 (H)  26.0 - 34.0 pg Final   MCHC 03/09/2024 34.0  30.0 - 36.0 g/dL Final   RDW 91/85/7974 14.6  11.5 - 15.5 % Final   Platelets 03/09/2024 181  150 - 400 K/uL Final   nRBC 03/09/2024 0.0  0.0 - 0.2 % Final   Neutrophils Relative % 03/09/2024 64  % Final   Neutro Abs 03/09/2024 2.9  1.7 - 7.7 K/uL Final   Lymphocytes Relative 03/09/2024 22  % Final   Lymphs Abs 03/09/2024 1.0  0.7 - 4.0 K/uL Final   Monocytes Relative 03/09/2024 10  % Final   Monocytes Absolute 03/09/2024 0.5  0.1 - 1.0 K/uL Final   Eosinophils Relative 03/09/2024 4  % Final   Eosinophils Absolute 03/09/2024 0.2  0.0 - 0.5 K/uL Final   Basophils Relative 03/09/2024 0  % Final   Basophils Absolute 03/09/2024 0.0  0.0 - 0.1 K/uL Final   Immature Granulocytes 03/09/2024 0  % Final   Abs Immature Granulocytes 03/09/2024 0.01  0.00 - 0.07 K/uL Final   Performed at [facility], [address] [place], [place] [address]   Sodium 03/09/2024 141  135 - 145 mmol/L Final   Potassium 03/09/2024 4.0  3.5 - 5.1 mmol/L Final   Chloride 03/09/2024 104  98 - 111 mmol/L Final   CO2 03/09/2024 25  22 - 32 mmol/L Final   Glucose, Bld 03/09/2024 94  70 - 99 mg/dL Final   Glucose reference range applies only to samples taken after fasting for at least 8  hours.   BUN 03/09/2024 12  8 - 23 mg/dL Final   Creatinine, Ser 03/09/2024 0.70  0.61 - 1.24 mg/dL Final   Calcium 91/85/7974 9.4  8.9 - 10.3 mg/dL Final   Total Protein 91/85/7974 6.9  6.5 - 8.1 g/dL Final   Albumin 91/85/7974 4.1  3.5 - 5.0 g/dL Final   AST 91/85/7974 28  15 - 41 U/L Final   ALT 03/09/2024 26  0 - 44 U/L Final   Alkaline Phosphatase 03/09/2024 41  38 - 126 U/L Final   Total Bilirubin 03/09/2024 0.7  0.0 - 1.2 mg/dL Final   GFR, Estimated 03/09/2024 >60  >60 mL/min Final   Comment: (NOTE) Calculated using the CKD-EPI Creatinine Equation (2021)    Anion gap 03/09/2024 12  5 - 15 Final   Performed at [facility], [address] [place], [place] [address]  Admission on 03/06/2024, Discharged on 03/06/2024  Component Date Value Ref Range Status   Glucose-Capillary 03/06/2024 112 (H)  70 - 99 mg/dL Final   Glucose reference range applies only to samples taken after fasting for at least 8 hours.    ECG: Date: 12/10/2023  Time ECG obtained: 1130 AM Rate: 61 bpm Rhythm: normal sinus Axis (leads I and aVF): normal Intervals: PR 176 ms. QRS 94 ms. QTc 432 ms. ST segment and T wave changes: No evidence of acute T wave abnormalities or significant ST segment elevation or depression.  Evidence of a possible, age undetermined, prior infarct:  No Comparison: Similar to previous tracing obtained on 11/12/2020   IMAGING / PROCEDURES: TRANSTHORACIC ECHOCARDIOGRAM performed on 12/23/2023 Left ventricular ejection fraction, by estimation, is 55 to 60%. Left ventricular ejection fraction by 2D MOD biplane is 57.5 %. The left ventricle has normal function. The left ventricle has no regional wall motion  abnormalities. Left ventricular diastolic parameters are consistent with Grade I diastolic dysfunction (impaired relaxation).  Right ventricular systolic function is normal. The right ventricular size is normal. There is moderately elevated pulmonary artery systolic  \pressure.  The mitral valve is normal in structure. Mild mitral valve regurgitation. The aortic valve is tricuspid. Aortic valve regurgitation is mild. Aortic valve sclerosis is present, with no evidence of aortic valve stenosis.  Aortic dilatation noted. There is mild dilatation of the aortic root, measuring 41 mm.  The inferior vena cava is dilated in size with <50% respiratory variability, suggesting right atrial pressure of 15 mmHg.   NM PET (PSMA) SKULL TO MID THIGH performed on 08/19/2022 Focal intense activity in the RIGHT prostate gland apex consistent primary prostate adenocarcinoma No evidence metastatic adenopathy in the pelvis or periaortic retroperitoneum. No evidence of visceral metastasis or skeletal metastasis.  MR BRAIN WO CONTRAST performed on 05/29/2022 No acute intracranial abnormality. 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 Small chronic right cerebellar infarct.  MR PROSTATE W WO CONTRAST performed on 05/19/2022 PI-RADS category 5 lesion of the right anterior peripheral zone and anterior fibromuscular stroma at the apex. Targeting data sent to [facility]. Prostatomegaly and benign prostatic hypertrophy. Colonic diverticulosis. Degenerative arthropathy of the hips.   IMPRESSION AND PLAN: Brian Callahan has been referred for pre-anesthesia review and clearance prior to him undergoing the planned anesthetic and procedural courses. Available labs, pertinent testing, and imaging results were personally reviewed by me in preparation for upcoming operative/procedural course. [facility] medical record has been updated following extensive record review and patient interview with PAT staff.   This patient has been appropriately cleared by cardiology with an overall ACCEPTABLE risk of patient experiencing significant perioperative cardiovascular complications. Based on clinical review performed today (03/13/24), barring any significant acute changes in the  patient's overall condition, it is anticipated that he will be able to proceed with the planned surgical intervention. Any acute changes in clinical condition may necessitate his procedure being postponed and/or cancelled. Patient will meet with anesthesia team (MD and/or CRNA) on the day of his procedure for preoperative evaluation/assessment. Questions regarding anesthetic course will be fielded at that time.   Pre-surgical instructions were reviewed with the patient during his PAT appointment, and questions were fielded to satisfaction by PAT clinical staff. He has been instructed on which medications that he will need to hold prior to surgery, as well as the ones that have been deemed safe/appropriate to take on the day of his procedure. As part of the general education provided by PAT, patient made aware both verbally and in writing, that he would need to abstain from the use of any illegal substances during his perioperative course. He was advised that failure to follow the provided instructions could necessitate case cancellation or result in serious perioperative complications up to and including death. Patient encouraged to contact PAT and/or his surgeon's office to discuss any questions or concerns that may arise prior to surgery; verbalized understanding.   Dorise Pereyra, [name], [name], [name], [name] [facility]  Perioperative Services Nurse Practitioner Phone: [phone] Fax: [phone] 03/13/24 4:44 PM  NOTE: This note has been prepared using Dragon dictation software. Despite my best ability to proofread, there is always the potential that unintentional transcriptional errors may still occur from this process.

## 2024-03-14 ENCOUNTER — Ambulatory Visit: Payer: Self-pay | Admitting: Urgent Care

## 2024-03-14 ENCOUNTER — Other Ambulatory Visit: Payer: Self-pay

## 2024-03-14 ENCOUNTER — Encounter
Admission: RE | Disposition: A | Discharge status: Home / Self Care | Payer: Self-pay | Source: Home / Self Care | Attending: Surgery

## 2024-03-14 ENCOUNTER — Encounter: Payer: Self-pay | Admitting: Surgery

## 2024-03-14 ENCOUNTER — Ambulatory Visit

## 2024-03-14 ENCOUNTER — Ambulatory Visit
Admission: RE | Admit: 2024-03-14 | Discharge: 2024-03-15 | Disposition: A | Discharge status: Home / Self Care | Attending: Surgery | Admitting: Surgery

## 2024-03-14 ENCOUNTER — Ambulatory Visit: Admitting: Urgent Care

## 2024-03-14 DIAGNOSIS — Z7984 Long term (current) use of oral hypoglycemic drugs: Secondary | ICD-10-CM | POA: Insufficient documentation

## 2024-03-14 DIAGNOSIS — I083 Combined rheumatic disorders of mitral, aortic and tricuspid valves: Secondary | ICD-10-CM | POA: Insufficient documentation

## 2024-03-14 DIAGNOSIS — L4050 Arthropathic psoriasis, unspecified: Secondary | ICD-10-CM | POA: Insufficient documentation

## 2024-03-14 DIAGNOSIS — I272 Pulmonary hypertension, unspecified: Secondary | ICD-10-CM | POA: Insufficient documentation

## 2024-03-14 DIAGNOSIS — J449 Chronic obstructive pulmonary disease, unspecified: Secondary | ICD-10-CM | POA: Insufficient documentation

## 2024-03-14 DIAGNOSIS — Z87891 Personal history of nicotine dependence: Secondary | ICD-10-CM | POA: Insufficient documentation

## 2024-03-14 DIAGNOSIS — F329 Major depressive disorder, single episode, unspecified: Secondary | ICD-10-CM | POA: Insufficient documentation

## 2024-03-14 DIAGNOSIS — I119 Hypertensive heart disease without heart failure: Secondary | ICD-10-CM | POA: Insufficient documentation

## 2024-03-14 DIAGNOSIS — J986 Disorders of diaphragm: Secondary | ICD-10-CM | POA: Insufficient documentation

## 2024-03-14 DIAGNOSIS — D649 Anemia, unspecified: Secondary | ICD-10-CM | POA: Insufficient documentation

## 2024-03-14 DIAGNOSIS — Z79899 Other long term (current) drug therapy: Secondary | ICD-10-CM | POA: Insufficient documentation

## 2024-03-14 DIAGNOSIS — I6782 Cerebral ischemia: Secondary | ICD-10-CM | POA: Insufficient documentation

## 2024-03-14 DIAGNOSIS — M1612 Unilateral primary osteoarthritis, left hip: Secondary | ICD-10-CM | POA: Insufficient documentation

## 2024-03-14 DIAGNOSIS — Z96642 Presence of left artificial hip joint: Secondary | ICD-10-CM

## 2024-03-14 DIAGNOSIS — E119 Type 2 diabetes mellitus without complications: Secondary | ICD-10-CM | POA: Insufficient documentation

## 2024-03-14 DIAGNOSIS — I5189 Other ill-defined heart diseases: Secondary | ICD-10-CM

## 2024-03-14 DIAGNOSIS — Z8673 Personal history of transient ischemic attack (TIA), and cerebral infarction without residual deficits: Secondary | ICD-10-CM | POA: Insufficient documentation

## 2024-03-14 DIAGNOSIS — N138 Other obstructive and reflux uropathy: Secondary | ICD-10-CM | POA: Insufficient documentation

## 2024-03-14 DIAGNOSIS — N401 Enlarged prostate with lower urinary tract symptoms: Secondary | ICD-10-CM | POA: Insufficient documentation

## 2024-03-14 DIAGNOSIS — Z01818 Encounter for other preprocedural examination: Secondary | ICD-10-CM

## 2024-03-14 DIAGNOSIS — I700 Atherosclerosis of aorta: Secondary | ICD-10-CM | POA: Insufficient documentation

## 2024-03-14 DIAGNOSIS — Z8546 Personal history of malignant neoplasm of prostate: Secondary | ICD-10-CM | POA: Insufficient documentation

## 2024-03-14 DIAGNOSIS — E785 Hyperlipidemia, unspecified: Secondary | ICD-10-CM | POA: Insufficient documentation

## 2024-03-14 HISTORY — DX: Cerebral infarction, unspecified: I63.9

## 2024-03-14 HISTORY — DX: Depression, unspecified: F32.A

## 2024-03-14 HISTORY — DX: Other cerebrovascular disease: I67.89

## 2024-03-14 HISTORY — DX: Thoracic aortic ectasia: I77.810

## 2024-03-14 HISTORY — DX: Polyp of colon: K63.5

## 2024-03-14 HISTORY — DX: Arthropathic psoriasis, unspecified: L40.50

## 2024-03-14 HISTORY — DX: Gastritis, unspecified, without bleeding: K29.70

## 2024-03-14 HISTORY — DX: Cataract extraction status, left eye: Z98.41

## 2024-03-14 HISTORY — DX: Diverticulosis of intestine, part unspecified, without perforation or abscess without bleeding: K57.90

## 2024-03-14 HISTORY — DX: Long term (current) use of unspecified immunomodulators and immunosuppressants: Z79.60

## 2024-03-14 HISTORY — DX: Cataract extraction status, right eye: Z98.41

## 2024-03-14 HISTORY — DX: Atherosclerosis of aorta: I70.0

## 2024-03-14 HISTORY — PX: TOTAL HIP ARTHROPLASTY: SHX124

## 2024-03-14 HISTORY — DX: Other ill-defined heart diseases: I51.89

## 2024-03-14 HISTORY — DX: Insomnia, unspecified: G47.00

## 2024-03-14 HISTORY — DX: Pulmonary hypertension, unspecified: I27.20

## 2024-03-14 HISTORY — DX: Osteoarthritis of hip, unspecified: M16.9

## 2024-03-14 LAB — GLUCOSE, CAPILLARY
Glucose-Capillary: 133 mg/dL — ABNORMAL HIGH (ref 70–99)
Glucose-Capillary: 147 mg/dL — ABNORMAL HIGH (ref 70–99)

## 2024-03-14 LAB — ABO/RH: ABO/RH(D): O NEG

## 2024-03-14 SURGERY — ARTHROPLASTY, HIP, TOTAL,POSTERIOR APPROACH
Anesthesia: Spinal | Site: Hip | Laterality: Left

## 2024-03-14 MED ORDER — METOCLOPRAMIDE HCL 10 MG PO TABS: 5.0000 mg | ORAL_TABLET | Freq: Three times a day (TID) | ORAL | Status: DC | PRN

## 2024-03-14 MED ORDER — CHLORHEXIDINE GLUCONATE 0.12 % MT SOLN
15.0000 mL | Freq: Once | OROMUCOSAL | Status: AC
  Administered 2024-03-14: 15 mL via OROMUCOSAL

## 2024-03-14 MED ORDER — BUPIVACAINE-EPINEPHRINE (PF) 0.5% -1:200000 IJ SOLN
INTRAMUSCULAR | Status: AC
  Filled 2024-03-14: qty 10

## 2024-03-14 MED ORDER — CEFAZOLIN SODIUM-DEXTROSE 2-4 GM/100ML-% IV SOLN
2.0000 g | INTRAVENOUS | Status: AC
  Administered 2024-03-14: 2 g via INTRAVENOUS

## 2024-03-14 MED ORDER — OXYCODONE HCL 5 MG PO TABS
ORAL_TABLET | ORAL | Status: AC
  Filled 2024-03-14: qty 1

## 2024-03-14 MED ORDER — KETOROLAC TROMETHAMINE 30 MG/ML IJ SOLN
INTRAMUSCULAR | Status: DC | PRN
Start: 2024-03-14 — End: 2024-03-14
  Administered 2024-03-14: 30 mg via INTRAMUSCULAR

## 2024-03-14 MED ORDER — IMIPRAMINE HCL 25 MG PO TABS
25.0000 mg | ORAL_TABLET | Freq: Every day | ORAL | Status: DC
  Administered 2024-03-14: 25 mg via ORAL
  Filled 2024-03-14: qty 1

## 2024-03-14 MED ORDER — FENTANYL CITRATE (PF) 100 MCG/2ML IJ SOLN
INTRAMUSCULAR | Status: AC
  Filled 2024-03-14: qty 2

## 2024-03-14 MED ORDER — OXYCODONE HCL 5 MG PO TABS
5.0000 mg | ORAL_TABLET | Freq: Once | ORAL | Status: AC | PRN
  Administered 2024-03-14: 5 mg via ORAL

## 2024-03-14 MED ORDER — ONDANSETRON HCL 4 MG PO TABS: 4.0000 mg | ORAL_TABLET | Freq: Four times a day (QID) | ORAL | Status: DC | PRN

## 2024-03-14 MED ORDER — CEFAZOLIN SODIUM-DEXTROSE 2-4 GM/100ML-% IV SOLN
2.0000 g | Freq: Four times a day (QID) | INTRAVENOUS | Status: AC
  Administered 2024-03-14 (×2): 2 g via INTRAVENOUS
  Filled 2024-03-14: qty 100

## 2024-03-14 MED ORDER — METFORMIN HCL 500 MG PO TABS
500.0000 mg | ORAL_TABLET | Freq: Two times a day (BID) | ORAL | Status: DC
  Administered 2024-03-14 – 2024-03-15 (×2): 500 mg via ORAL

## 2024-03-14 MED ORDER — PROPOFOL 10 MG/ML IV BOLUS
INTRAVENOUS | Status: DC | PRN
  Administered 2024-03-14: 50 ug/kg/min via INTRAVENOUS
  Administered 2024-03-14: 30 mg via INTRAVENOUS
  Administered 2024-03-14: 150 mg via INTRAVENOUS

## 2024-03-14 MED ORDER — SODIUM CHLORIDE (PF) 0.9 % IJ SOLN
INTRAMUSCULAR | Status: AC
  Filled 2024-03-14: qty 20

## 2024-03-14 MED ORDER — PROPOFOL 1000 MG/100ML IV EMUL
INTRAVENOUS | Status: AC
  Filled 2024-03-14: qty 100

## 2024-03-14 MED ORDER — SODIUM CHLORIDE 0.9 % IV SOLN: INTRAVENOUS | Status: DC

## 2024-03-14 MED ORDER — ACETAMINOPHEN 10 MG/ML IV SOLN
INTRAVENOUS | Status: DC | PRN
  Administered 2024-03-14: 1000 mg via INTRAVENOUS

## 2024-03-14 MED ORDER — FLEET ENEMA RE ENEM: 1.0000 | ENEMA | Freq: Once | RECTAL | Status: DC | PRN

## 2024-03-14 MED ORDER — MAGNESIUM HYDROXIDE 400 MG/5ML PO SUSP: 30.0000 mL | Freq: Every day | ORAL | Status: DC | PRN

## 2024-03-14 MED ORDER — ENALAPRIL MALEATE 2.5 MG PO TABS
5.0000 mg | ORAL_TABLET | Freq: Two times a day (BID) | ORAL | Status: DC
  Administered 2024-03-14 – 2024-03-15 (×2): 5 mg via ORAL
  Filled 2024-03-14 (×2): qty 2

## 2024-03-14 MED ORDER — ENALAPRIL MALEATE 5 MG PO TABS
5.0000 mg | ORAL_TABLET | Freq: Two times a day (BID) | ORAL | Status: DC
  Filled 2024-03-14: qty 1

## 2024-03-14 MED ORDER — ACETAMINOPHEN 500 MG PO TABS
1000.0000 mg | ORAL_TABLET | Freq: Four times a day (QID) | ORAL | Status: DC
  Administered 2024-03-14 – 2024-03-15 (×3): 1000 mg via ORAL
  Filled 2024-03-14 (×2): qty 2

## 2024-03-14 MED ORDER — FENTANYL CITRATE (PF) 100 MCG/2ML IJ SOLN
INTRAMUSCULAR | Status: DC | PRN
  Administered 2024-03-14 (×2): 50 ug via INTRAVENOUS

## 2024-03-14 MED ORDER — BUPIVACAINE-EPINEPHRINE (PF) 0.5% -1:200000 IJ SOLN
INTRAMUSCULAR | Status: DC | PRN
  Administered 2024-03-14: 30 mL

## 2024-03-14 MED ORDER — LACTATED RINGERS IV SOLN: INTRAVENOUS | Status: DC

## 2024-03-14 MED ORDER — FERROUS SULFATE 325 (65 FE) MG PO TABS
325.0000 mg | ORAL_TABLET | Freq: Every morning | ORAL | Status: DC
  Administered 2024-03-14 – 2024-03-15 (×2): 325 mg via ORAL
  Filled 2024-03-14 (×2): qty 1

## 2024-03-14 MED ORDER — APIXABAN 2.5 MG PO TABS
ORAL_TABLET | ORAL | 0 refills | Status: AC
Start: 2024-03-15 — End: ?

## 2024-03-14 MED ORDER — PHENYLEPHRINE HCL (PRESSORS) 10 MG/ML IV SOLN
INTRAVENOUS | Status: AC
  Filled 2024-03-14: qty 1

## 2024-03-14 MED ORDER — CLOBETASOL PROPIONATE 0.05 % EX OINT: 1.0000 | TOPICAL_OINTMENT | Freq: Every day | CUTANEOUS | Status: DC | PRN

## 2024-03-14 MED ORDER — IPRATROPIUM-ALBUTEROL 0.5-2.5 (3) MG/3ML IN SOLN
RESPIRATORY_TRACT | Status: AC
  Filled 2024-03-14: qty 3

## 2024-03-14 MED ORDER — LIDOCAINE HCL (CARDIAC) PF 100 MG/5ML IV SOSY
PREFILLED_SYRINGE | INTRAVENOUS | Status: DC | PRN
  Administered 2024-03-14: 80 mg via INTRAVENOUS

## 2024-03-14 MED ORDER — TAMSULOSIN HCL 0.4 MG PO CAPS: 0.4000 mg | ORAL_CAPSULE | Freq: Two times a day (BID) | ORAL | Status: AC

## 2024-03-14 MED ORDER — PHENYLEPHRINE 80 MCG/ML (10ML) SYRINGE FOR IV PUSH (FOR BLOOD PRESSURE SUPPORT)
PREFILLED_SYRINGE | INTRAVENOUS | Status: DC | PRN
  Administered 2024-03-14 (×2): 80 ug via INTRAVENOUS

## 2024-03-14 MED ORDER — OXYCODONE HCL 5 MG PO TABS
5.0000 mg | ORAL_TABLET | ORAL | Status: DC | PRN
  Administered 2024-03-14: 10 mg via ORAL
  Administered 2024-03-15: 5 mg via ORAL
  Filled 2024-03-14: qty 1

## 2024-03-14 MED ORDER — BUPIVACAINE HCL (PF) 0.5 % IJ SOLN
INTRAMUSCULAR | Status: AC
  Filled 2024-03-14: qty 10

## 2024-03-14 MED ORDER — APIXABAN 2.5 MG PO TABS
2.5000 mg | ORAL_TABLET | Freq: Two times a day (BID) | ORAL | Status: DC
  Administered 2024-03-15: 2.5 mg via ORAL
  Filled 2024-03-14: qty 1

## 2024-03-14 MED ORDER — LIDOCAINE HCL (PF) 2 % IJ SOLN
INTRAMUSCULAR | Status: AC
  Filled 2024-03-14: qty 5

## 2024-03-14 MED ORDER — ONDANSETRON HCL 4 MG/2ML IJ SOLN
4.0000 mg | Freq: Once | INTRAMUSCULAR | Status: DC | PRN
Start: 2024-03-14 — End: 2024-03-14

## 2024-03-14 MED ORDER — TRANEXAMIC ACID-NACL 1000-0.7 MG/100ML-% IV SOLN
INTRAVENOUS | Status: DC | PRN
  Administered 2024-03-14 (×2): 1000 mg via INTRAVENOUS

## 2024-03-14 MED ORDER — 0.9 % SODIUM CHLORIDE (POUR BTL) OPTIME
TOPICAL | Status: DC | PRN
Start: 2024-03-14 — End: 2024-03-14
  Administered 2024-03-14: 1000 mL

## 2024-03-14 MED ORDER — ACETAMINOPHEN 325 MG PO TABS: 325.0000 mg | ORAL_TABLET | Freq: Four times a day (QID) | ORAL | Status: DC | PRN

## 2024-03-14 MED ORDER — OXYCODONE HCL 5 MG PO TABS: 5.0000 mg | ORAL_TABLET | ORAL | 0 refills | Status: AC | PRN

## 2024-03-14 MED ORDER — KETOROLAC TROMETHAMINE 30 MG/ML IJ SOLN
INTRAMUSCULAR | Status: AC
  Filled 2024-03-14: qty 1

## 2024-03-14 MED ORDER — SUGAMMADEX SODIUM 200 MG/2ML IV SOLN
INTRAVENOUS | Status: DC | PRN
Start: 2024-03-14 — End: 2024-03-14
  Administered 2024-03-14: 200 mg via INTRAVENOUS

## 2024-03-14 MED ORDER — ONDANSETRON HCL 4 MG/2ML IJ SOLN
4.0000 mg | Freq: Four times a day (QID) | INTRAMUSCULAR | Status: DC | PRN
Start: 2024-03-14 — End: 2024-03-15

## 2024-03-14 MED ORDER — ORAL CARE MOUTH RINSE: 15.0000 mL | Freq: Once | OROMUCOSAL | Status: AC

## 2024-03-14 MED ORDER — ACETAMINOPHEN 10 MG/ML IV SOLN
INTRAVENOUS | Status: AC
  Filled 2024-03-14: qty 100

## 2024-03-14 MED ORDER — BUPIVACAINE LIPOSOME 1.3 % IJ SUSP
INTRAMUSCULAR | Status: AC
Start: 2024-03-14 — End: 2024-03-14
  Filled 2024-03-14: qty 20

## 2024-03-14 MED ORDER — TRIAMCINOLONE ACETONIDE 40 MG/ML IJ SUSP
INTRAMUSCULAR | Status: AC
  Filled 2024-03-14: qty 2

## 2024-03-14 MED ORDER — BISACODYL 10 MG RE SUPP: 10.0000 mg | Freq: Every day | RECTAL | Status: DC | PRN

## 2024-03-14 MED ORDER — IPRATROPIUM-ALBUTEROL 0.5-2.5 (3) MG/3ML IN SOLN
3.0000 mL | Freq: Two times a day (BID) | RESPIRATORY_TRACT | Status: DC
  Administered 2024-03-14 (×2): 3 mL via RESPIRATORY_TRACT
  Filled 2024-03-14: qty 3

## 2024-03-14 MED ORDER — DEXAMETHASONE SODIUM PHOSPHATE 10 MG/ML IJ SOLN
INTRAMUSCULAR | Status: AC
  Filled 2024-03-14: qty 1

## 2024-03-14 MED ORDER — METFORMIN HCL 500 MG PO TABS
ORAL_TABLET | ORAL | Status: AC
  Filled 2024-03-14: qty 1

## 2024-03-14 MED ORDER — CEFAZOLIN SODIUM-DEXTROSE 2-4 GM/100ML-% IV SOLN
INTRAVENOUS | Status: AC
  Filled 2024-03-14: qty 100

## 2024-03-14 MED ORDER — DIPHENHYDRAMINE HCL 12.5 MG/5ML PO ELIX: 12.5000 mg | ORAL_SOLUTION | ORAL | Status: DC | PRN

## 2024-03-14 MED ORDER — ACETAMINOPHEN 500 MG PO TABS
ORAL_TABLET | ORAL | Status: AC
  Filled 2024-03-14: qty 2

## 2024-03-14 MED ORDER — METOPROLOL SUCCINATE ER 25 MG PO TB24
75.0000 mg | ORAL_TABLET | ORAL | Status: DC
  Administered 2024-03-15: 75 mg via ORAL
  Filled 2024-03-14 (×2): qty 3

## 2024-03-14 MED ORDER — FENTANYL CITRATE (PF) 100 MCG/2ML IJ SOLN
25.0000 ug | INTRAMUSCULAR | Status: DC | PRN
  Administered 2024-03-14 (×3): 50 ug via INTRAVENOUS

## 2024-03-14 MED ORDER — CHLORHEXIDINE GLUCONATE 0.12 % MT SOLN
OROMUCOSAL | Status: AC
  Filled 2024-03-14: qty 15

## 2024-03-14 MED ORDER — PROPOFOL 10 MG/ML IV BOLUS
INTRAVENOUS | Status: AC
  Filled 2024-03-14: qty 20

## 2024-03-14 MED ORDER — SODIUM CHLORIDE 0.9 % IV SOLN
INTRAVENOUS | Status: DC | PRN
  Administered 2024-03-14: 60 mL

## 2024-03-14 MED ORDER — OXYCODONE HCL 5 MG/5ML PO SOLN: 5.0000 mg | Freq: Once | ORAL | Status: AC | PRN

## 2024-03-14 MED ORDER — TRIAMCINOLONE ACETONIDE 40 MG/ML IJ SUSP
INTRAMUSCULAR | Status: DC | PRN
  Administered 2024-03-14: 80 mg

## 2024-03-14 MED ORDER — OXYCODONE HCL 5 MG PO TABS
5.0000 mg | ORAL_TABLET | ORAL | Status: DC | PRN
  Administered 2024-03-14 (×2): 5 mg via ORAL
  Filled 2024-03-14 (×2): qty 1

## 2024-03-14 MED ORDER — ACETAMINOPHEN 10 MG/ML IV SOLN: 1000.0000 mg | Freq: Once | INTRAVENOUS | Status: DC | PRN

## 2024-03-14 MED ORDER — EZETIMIBE 10 MG PO TABS
10.0000 mg | ORAL_TABLET | ORAL | Status: DC
  Administered 2024-03-15: 10 mg via ORAL

## 2024-03-14 MED ORDER — SODIUM CHLORIDE 0.9 % BOLUS PEDS: 250.0000 mL | Freq: Once | INTRAVENOUS | Status: DC

## 2024-03-14 MED ORDER — METOCLOPRAMIDE HCL 5 MG/ML IJ SOLN: 5.0000 mg | Freq: Three times a day (TID) | INTRAMUSCULAR | Status: DC | PRN

## 2024-03-14 MED ORDER — DOCUSATE SODIUM 100 MG PO CAPS
100.0000 mg | ORAL_CAPSULE | Freq: Two times a day (BID) | ORAL | Status: DC
  Administered 2024-03-14 – 2024-03-15 (×2): 100 mg via ORAL
  Filled 2024-03-14 (×2): qty 1

## 2024-03-14 MED ORDER — ONDANSETRON HCL 4 MG/2ML IJ SOLN
INTRAMUSCULAR | Status: AC
  Filled 2024-03-14: qty 2

## 2024-03-14 MED ORDER — ROCURONIUM BROMIDE 100 MG/10ML IV SOLN
INTRAVENOUS | Status: DC | PRN
Start: 2024-03-14 — End: 2024-03-14
  Administered 2024-03-14 (×2): 40 mg via INTRAVENOUS
  Administered 2024-03-14: 20 mg via INTRAVENOUS

## 2024-03-14 MED ORDER — TRANEXAMIC ACID-NACL 1000-0.7 MG/100ML-% IV SOLN
INTRAVENOUS | Status: AC
  Filled 2024-03-14: qty 100

## 2024-03-14 MED ORDER — PROPOFOL 500 MG/50ML IV EMUL
INTRAVENOUS | Status: DC | PRN
  Administered 2024-03-14: 125 ug/kg/min via INTRAVENOUS

## 2024-03-14 MED ORDER — TAMSULOSIN HCL 0.4 MG PO CAPS
0.4000 mg | ORAL_CAPSULE | Freq: Two times a day (BID) | ORAL | Status: DC
  Administered 2024-03-14 – 2024-03-15 (×2): 0.4 mg via ORAL
  Filled 2024-03-14 (×2): qty 1

## 2024-03-14 MED ORDER — ONDANSETRON HCL 4 MG/2ML IJ SOLN: 4.0000 mg | Freq: Four times a day (QID) | INTRAMUSCULAR | Status: DC | PRN

## 2024-03-14 MED ORDER — MELATONIN 5 MG PO TABS: 5.0000 mg | ORAL_TABLET | Freq: Every evening | ORAL | Status: DC | PRN

## 2024-03-14 SURGICAL SUPPLY — 51 items
BIT DRILL JC 5IN 2.4M 127 24FL (BIT) IMPLANT
BLADE SAGITTAL WIDE XTHICK NO (BLADE) ×1 IMPLANT
BLADE SURG SZ20 CARB STEEL (BLADE) ×1 IMPLANT
CHLORAPREP W/TINT 26 (MISCELLANEOUS) ×1 IMPLANT
DRAPE IMP U-DRAPE 54X76 (DRAPES) IMPLANT
DRAPE INCISE IOBAN 66X60 STRL (DRAPES) ×1 IMPLANT
DRAPE SHEET LG 3/4 BI-LAMINATE (DRAPES) ×1 IMPLANT
DRAPE SURG 17X11 SM STRL (DRAPES) ×2 IMPLANT
DRSG MEPILEX SACRM 8.7X9.8 (GAUZE/BANDAGES/DRESSINGS) ×1 IMPLANT
DRSG OPSITE POSTOP 4X10 (GAUZE/BANDAGES/DRESSINGS) ×1 IMPLANT
ELECT BLADE 6.5 EXT (BLADE) IMPLANT
ELECT CAUTERY BLADE 6.4 (BLADE) ×1 IMPLANT
GAUZE 4X4 16PLY ~~LOC~~+RFID DBL (SPONGE) ×1 IMPLANT
GAUZE XEROFORM 1X8 LF (GAUZE/BANDAGES/DRESSINGS) ×1 IMPLANT
GLOVE BIO SURGEON STRL SZ7.5 (GLOVE) ×4 IMPLANT
GLOVE BIO SURGEON STRL SZ8 (GLOVE) ×4 IMPLANT
GLOVE BIOGEL PI IND STRL 8 (GLOVE) ×1 IMPLANT
GLOVE INDICATOR 8.0 STRL GRN (GLOVE) ×1 IMPLANT
GOWN STRL REUS W/ TWL LRG LVL3 (GOWN DISPOSABLE) ×1 IMPLANT
GOWN STRL REUS W/ TWL XL LVL3 (GOWN DISPOSABLE) ×1 IMPLANT
HEAD CERAMIC BIOLOX 36MM (Head) IMPLANT
HOLSTER ELECTROSUGICAL PENCIL (MISCELLANEOUS) ×1 IMPLANT
HOOD PEEL AWAY T7 (MISCELLANEOUS) ×3 IMPLANT
KIT TURNOVER KIT A (KITS) ×1 IMPLANT
LINER ACE G7 36 SZF HIGH WALL (Liner) IMPLANT
MANIFOLD NEPTUNE II (INSTRUMENTS) ×1 IMPLANT
NDL FILTER BLUNT 18X1 1/2 (NEEDLE) ×1 IMPLANT
NDL SAFETY ECLIPSE 18X1.5 (NEEDLE) ×2 IMPLANT
NDL SPNL 20GX3.5 QUINCKE YW (NEEDLE) ×1 IMPLANT
NEEDLE FILTER BLUNT 18X1 1/2 (NEEDLE) ×1 IMPLANT
NEEDLE SPNL 20GX3.5 QUINCKE YW (NEEDLE) ×1 IMPLANT
PACK HIP PROSTHESIS (MISCELLANEOUS) ×1 IMPLANT
PENCIL SMOKE EVACUATOR (MISCELLANEOUS) ×1 IMPLANT
PIN STEIN SMOOTH 3/16X9 (Pin) ×3 IMPLANT
SHELL ACETAB 54 4H HIP (Shell) IMPLANT
SLEEVE HIP BIOLOX -6MM OFFSET (Sleeve) IMPLANT
SOL .9 NS 3000ML IRR UROMATIC (IV SOLUTION) ×1 IMPLANT
SPONGE T-LAP 18X18 ~~LOC~~+RFID (SPONGE) IMPLANT
STAPLER SKIN PROX 35W (STAPLE) ×1 IMPLANT
STEM COLLARLESS FULL 11X135 (Stem) IMPLANT
SUT TICRON 2-0 30IN 311381 (SUTURE) ×3 IMPLANT
SUT VIC AB 0 CT1 36 (SUTURE) ×1 IMPLANT
SUT VIC AB 1 CT1 36 (SUTURE) ×1 IMPLANT
SUT VIC AB 2-0 CT1 (SUTURE) ×3 IMPLANT
SYR 10ML LL (SYRINGE) ×1 IMPLANT
SYR 20ML LL LF (SYRINGE) ×1 IMPLANT
SYR 30ML LL (SYRINGE) ×2 IMPLANT
SYR 3ML LL SCALE MARK (SYRINGE) IMPLANT
TIP FAN IRRIG PULSAVAC PLUS (DISPOSABLE) ×1 IMPLANT
TRAP FLUID SMOKE EVACUATOR (MISCELLANEOUS) ×1 IMPLANT
WATER STERILE IRR 1000ML POUR (IV SOLUTION) ×1 IMPLANT

## 2024-03-14 NOTE — Discharge Instructions (Signed)
 Orthopedic discharge instructions: May shower with intact OpSite dressing. Apply ice frequently to left hip. Start Eliquis  1 tablet (2.5 mg) twice daily on Wednesday, 03/15/2024, for 2 weeks, then decrease to once daily for 4 weeks. Take pain medication as prescribed when needed.  May supplement with ES Tylenol  if necessary. May weight-bear as tolerated on left leg - use walker for balance and support. Follow-up in 10-14 days or as scheduled.

## 2024-03-14 NOTE — Transfer of Care (Signed)
 Immediate Anesthesia Transfer of Care Note  Patient: Brian Callahan  Procedure(s) Performed: ARTHROPLASTY, HIP, TOTAL,POSTERIOR APPROACH (Left: Hip)  Patient Location: PACU  Anesthesia Type:General  Level of Consciousness: drowsy and patient cooperative  Airway & Oxygen Therapy: Patient Spontanous Breathing and aerosol face mask  Post-op Assessment: Report given to RN and Post -op Vital signs reviewed and stable  Post vital signs: Reviewed and stable  Last Vitals:  Vitals Value Taken Time  BP 128/96 03/14/24 10:15  Temp    Pulse 74 03/14/24 10:16  Resp 19 03/14/24 10:16  SpO2 100 % 03/14/24 10:16  Vitals shown include unfiled device data.  Last Pain:  Vitals:   03/14/24 0615  TempSrc: Temporal  PainSc: 0-No pain         Complications: There were no known notable events for this encounter.

## 2024-03-14 NOTE — Discharge Summary (Signed)
 Physician Discharge Summary  Patient ID: LIEV BROCKBANK MRN: [id number] DOB/AGE: 81-Mar-1942 81 y.o.  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 11/08/2017   Esophagitis, reflux 11/08/2017   SOB (shortness of breath) on exertion 07/02/2017   BPH with obstruction/lower urinary tract symptoms 11/21/2015   History of elevated PSA 11/21/2015   H/O gastric ulcer 09/05/2015   Disorder of eyeball 05/23/2015   H/O infectious disease 05/23/2015   Blood glucose elevated 05/23/2015   BP (high blood pressure) 05/23/2015   HLD (hyperlipidemia) 05/23/2015   Psoriasis 05/23/2015   Absolute anemia 01/02/2014   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   Elevated prostate specific antigen (PSA) 01/02/2014   Elevated blood uric acid level 01/02/2014   Enlarged prostate without lower urinary tract symptoms (luts) 01/02/2014   Bleeding gastrointestinal 04/15/2012    Past Medical History:  Diagnosis Date   Anemia    Aortic atherosclerosis (HCC)    Aortic insufficiency    a. 06/2017 Echo: EF 50% with mild LVH, nl RV function, moderate AI, and mild MR/TR/PR.   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 2010 Cath ([facility]): reportedly nl; b. 06/2017 [facility]): EF 63%, mild inf wall ischemia->med rx.   Chronic dyspnea    Chronic GI bleeding    Colon polyps    COPD  (chronic obstructive pulmonary disease) (HCC)    Depression    Diaphragm paralysis (RIGHT)    Diastolic dysfunction    Diverticulosis    DM (diabetes mellitus), type 2 (HCC)    Elevated PSA    Embolus of mesenteric artery (HCC)    Gastritis    GERD (gastroesophageal reflux disease)    Gout    H/O hernia repair mid-90s   x2   History of prostate cancer s/p IMRT    History of [place] spotted fever    HLD (hyperlipidemia)    HTN (hypertension)    Incomplete bladder emptying    Insomnia    a.) takes melatonin PRN   Long term current use of immunosuppressive drug    a.) IL-17A for psoriatic arthritis (secukinumab)   Macular degeneration    Obesity    Osteoarthritis of hip    Psoriasis    Psoriatic arthritis (HCC)    a.) on IL-17A inhibitor (secukinumab)   Pulmonary hypertension (HCC)    S/P insertion of IVC (inferior vena caval) filter    SBO (small bowel obstruction); s/p partial colectomy 1998   Status post bilateral cataract extraction    Urinary frequency    Transfusion: None.   Consultants (if any):   Discharged Condition: Improved  Hospital Course: RIK WADEL is an 83 y.o. male who was admitted 03/14/2024 with a diagnosis of primary osteoarthritis of the left hip and went to the operating room on 03/14/2024 and underwent the above named procedures.    Surgeries: Procedure(s): ARTHROPLASTY,  HIP, TOTAL,POSTERIOR APPROACH on 03/14/2024 Patient tolerated the surgery well. Taken to PACU where he was stabilized and transferred to the post operative recovery area  Started on Eliquis  2.5mg  every 12 hrs. Foot pumps applied bilaterally at 80 mm. Heels elevated on bed with rolled towels. No evidence of DVT. Negative Homan. Physical therapy started on day #0 for gait training and transfer.   Patient's IV was removed on POD1.  Implants: Biomet press-fit system with a #11 laterally offset Echo femoral stem, a 54 mm acetabular shell with an E-poly hi-wall liner, and a 36  mm ceramic head with a -6 mm neck.   He was given perioperative antibiotics:  Anti-infectives (From admission, onward)    Start     Dose/Rate Route Frequency Ordered Stop   03/14/24 1400  ceFAZolin  (ANCEF ) IVPB 2g/100 mL premix        2 g 200 mL/hr over 30 Minutes Intravenous Every 6 hours 03/14/24 1301 03/14/24 2032   03/14/24 0615  ceFAZolin  (ANCEF ) IVPB 2g/100 mL premix        2 g 200 mL/hr over 30 Minutes Intravenous On call to O.R. 03/14/24 [id number] 03/14/24 0803     .  He was given sequential compression devices, early ambulation, and Eliquis  for DVT prophylaxis.  He benefited maximally from the hospital stay and there were no complications.    Recent vital signs:  Vitals:   03/15/24 0500 03/15/24 0728  BP: (!) 150/76 (!) 147/70  Pulse: 76 70  Resp: 16 17  Temp: 98.4 F (36.9 C) 98.2 F (36.8 C)  SpO2: 93% 94%    Recent laboratory studies:  Lab Results  Component Value Date   HGB 10.9 (L) 03/15/2024   HGB 13.0 03/09/2024   HGB 12.1 (L) 10/28/2023   Lab Results  Component Value Date   WBC 7.0 03/15/2024   PLT 144 (L) 03/15/2024   Lab Results  Component Value Date   INR 1.0 12/01/2013   Lab Results  Component Value Date   NA 138 03/15/2024   K 3.6 03/15/2024   CL 104 03/15/2024   CO2 27 03/15/2024   BUN 9 03/15/2024   CREATININE 0.62 03/15/2024   GLUCOSE 189 (H) 03/15/2024    Discharge Medications:   Allergies as of 03/15/2024       Reactions   Isosorbide Nitrate Other (See Comments)   unkn   Shellfish Allergy Other (See Comments)   gout   Trazodone And Nefazodone Diarrhea        Medication List     STOP taking these medications    OXYCODONE  ER PO       TAKE these medications    acetaminophen  500 MG tablet Commonly known as: TYLENOL  Take 500-1,000 mg by mouth every 6 (six) hours as needed (for headache/minor pain.).   apixaban  2.5 MG Tabs tablet Commonly known as: Eliquis  Take Eliquis  twice daily for 2 weeks, then decrease to  once daily for 4 weeks.   CALCIUM-VITAMIN D3 PO Take 1 tablet by mouth daily at 6 (six) AM.   clobetasol  ointment 0.05 % Commonly known as: TEMOVATE  Apply 1 application  topically daily as needed (psoriasis).   COSENTYX (300 MG DOSE) Trommald Inject 300 mg into the skin every 30 (thirty) days.   cyanocobalamin 500 MCG tablet Commonly known as: VITAMIN B12 Take 1,000 mcg by mouth daily.   enalapril  5 MG tablet Commonly known as: VASOTEC  Take 1 tablet (5 mg total) by mouth 2 (two) times daily.   ezetimibe   10 MG tablet Commonly known as: ZETIA  Take 10 mg by mouth every morning.   FeroSul 325 (65 Fe) MG tablet Generic drug: ferrous sulfate  Take 325 mg by mouth every morning.   imipramine  25 MG tablet Commonly known as: TOFRANIL  Take 25 mg by mouth at bedtime.   ipratropium-albuterol  0.5-2.5 (3) MG/3ML Soln Commonly known as: DUONEB Take 3 mLs by nebulization 2 (two) times daily.   melatonin 5 MG Tabs Take 5 mg by mouth at bedtime as needed.   metFORMIN  500 MG tablet Commonly known as: GLUCOPHAGE  Take 1 tablet by mouth 2 (two) times daily.   metoprolol  succinate 50 MG 24 hr tablet Commonly known as: TOPROL -XL Take 1.5 tablets by mouth every morning.   oxyCODONE  5 MG immediate release tablet Commonly known as: Roxicodone  Take 1-2 tablets (5-10 mg total) by mouth every 4 (four) hours as needed for moderate pain (pain score 4-6) or severe pain (pain score 7-10).   PRESERVISION AREDS 2 PO Take 1 capsule by mouth in the morning and at bedtime.   tamsulosin  0.4 MG Caps capsule Commonly known as: FLOMAX  Take 1 capsule (0.4 mg total) by mouth 2 (two) times daily. TAKE 1 CAPSULE BY MOUTH  TWICE DAILY What changed: additional instructions   TART CHERRY ADVANCED PO Take 12 mg by mouth every other day.               Durable Medical Equipment  (From admission, onward)           Start     Ordered   03/14/24 1403  DME 3 n 1  Once        03/14/24 1402   03/14/24  1403  DME Walker rolling  Once       Question Answer Comment  Walker: With 5 Inch Wheels   Patient needs a walker to treat with the following condition Status post total hip replacement, left      03/14/24 1402            Diagnostic Studies: DG HIP UNILAT W OR W/O PELVIS 2-3 VIEWS LEFT Result Date: 03/14/2024 EXAM: 2 or 3 VIEW(S) XRAY OF THE PELVIS AND LEFT HIP 03/14/2024 10:42:00 AM COMPARISON: None available. CLINICAL HISTORY: Status post total hip replacement, left. FINDINGS: JOINTS: Left hip arthroplasty. No hardware complication or periprosthetic fracture. Right hip space maintained for age. SOFT TISSUES: Radiation seeds or urolift pellets within the prostate. Vascular calcifications. IMPRESSION: 1. No acute abnormality of the left hip arthroplasty. Electronically signed by: Rockey Kilts MD 03/14/2024 11:05 AM EDT RP Workstation: [id number]   PERIPHERAL VASCULAR CATHETERIZATION Result Date: 03/06/2024 See surgical note for result.   Disposition: Plan for discharge home today. There are no questions and answers to display.           Follow-up Information     Kip Lynwood Double, PA-C Follow up in 14 day(s).   Specialty: Physician Assistant Why: [facility] [place] information: [address] [place] [address] [phone]                Signed: Lynwood LITTIE Kip PA-C 03/15/2024, 8:11 AM

## 2024-03-14 NOTE — Anesthesia Procedure Notes (Signed)
 Procedure Name: Intubation Date/Time: 03/14/2024 8:01 AM  Performed by: Bonnetta Jimmey SAUNDERS, CRNAPre-anesthesia Checklist: Patient identified, Emergency Drugs available, Suction available and Patient being monitored Patient Re-evaluated:Patient Re-evaluated prior to induction Oxygen Delivery Method: Circle system utilized Preoxygenation: Pre-oxygenation with 100% oxygen Induction Type: IV induction Ventilation: Mask ventilation with difficulty and Oral airway inserted - appropriate to patient size Laryngoscope Size: McGrath and 4 Grade View: Grade I Tube type: Oral Number of attempts: 1 Airway Equipment and Method: Stylet and Oral airway Placement Confirmation: ETT inserted through vocal cords under direct vision, positive ETCO2 and breath sounds checked- equal and bilateral Secured at: 21 cm Tube secured with: Tape Dental Injury: Teeth and Oropharynx as per pre-operative assessment

## 2024-03-14 NOTE — Anesthesia Postprocedure Evaluation (Addendum)
 Anesthesia Post Note  Patient: OSEAS DETTY  Procedure(s) Performed: ARTHROPLASTY, HIP, TOTAL,POSTERIOR APPROACH (Left: Hip)  Patient location during evaluation: PACU Anesthesia Type: General Level of consciousness: awake and alert, oriented and patient cooperative Pain management: pain level controlled Vital Signs Assessment: post-procedure vital signs reviewed and stable Respiratory status: spontaneous breathing, nonlabored ventilation and respiratory function stable Cardiovascular status: blood pressure returned to baseline and stable Postop Assessment: adequate PO intake Anesthetic complications: no   There were no known notable events for this encounter.   Last Vitals:  Vitals:   03/14/24 0615  BP: (!) 182/86  Pulse: 73  Resp: 15  Temp: 36.5 C  SpO2: 97%    Last Pain:  Vitals:   03/14/24 0615  TempSrc: Temporal  PainSc: 0-No pain                 Bevin JONELLE Marc

## 2024-03-14 NOTE — Anesthesia Preprocedure Evaluation (Addendum)
 Anesthesia Evaluation  Patient identified by MRN, date of birth, ID band Patient awake    Reviewed: Allergy & Precautions, NPO status , Patient's Chart, lab work & pertinent test results  History of Anesthesia Complications Negative for: history of anesthetic complications  Airway Mallampati: III   Neck ROM: Full    Dental  (+) Missing, Implants, Edentulous Upper   Pulmonary COPD, former smoker (quit 1992) Right diaphragm paralysis   Pulmonary exam normal breath sounds clear to auscultation       Cardiovascular hypertension, Normal cardiovascular exam Rhythm:Regular Rate:Normal  ECG 12/10/23: normal  Echo 12/23/23:   1. Left ventricular ejection fraction, by estimation, is 55 to 60%. Left ventricular ejection fraction by 2D MOD biplane is 57.5 %. The left ventricle has normal function. The left ventricle has no regional wall motion abnormalities. Left ventricular diastolic parameters are consistent with Grade I diastolic dysfunction (impaired relaxation).   2. Right ventricular systolic function is normal. The right ventricular size is normal. There is moderately elevated pulmonary artery systolic pressure.   3. The mitral valve is normal in structure. Mild mitral valve regurgitation.   4. The aortic valve is tricuspid. Aortic valve regurgitation is mild. Aortic valve sclerosis is present, with no evidence of aortic valve stenosis.   5. Aortic dilatation noted. There is mild dilatation of the aortic root, measuring 41 mm.   6. The inferior vena cava is dilated in size with <50% respiratory variability, suggesting right atrial pressure of 15 mmHg.    Neuro/Psych  PSYCHIATRIC DISORDERS  Depression    TIA (s/p IVC filter)   GI/Hepatic negative GI ROS,,,  Endo/Other  diabetes, Type 2    Renal/GU negative Renal ROS   Prostate CA; BPH    Musculoskeletal  (+) Arthritis ,  Gout    Abdominal   Peds  Hematology  (+) Blood  dyscrasia, anemia   Anesthesia Other Findings Reviewed and agree with Dorise Boor pre-anesthesia clinical review note.    Cardiology note 03/10/24:  1.  Preoperative Cardiovascular Risk Assessment:   According to the Revised Cardiac Risk Index (RCRI), his Perioperative Risk of Major Cardiac Event is (%): 0.4. His Functional Capacity in METs is: 4.4 according to the [facility] Activity Status Index (DASI).Therefore, based on ACC/AHA guidelines, patient would be at acceptable risk for the planned procedure without further cardiovascular testing.   Reproductive/Obstetrics                              Anesthesia Physical Anesthesia Plan  ASA: 3  Anesthesia Plan: Spinal   Post-op Pain Management:    Induction: Intravenous  PONV Risk Score and Plan: 2 and Propofol  infusion, TIVA, Treatment may vary due to age or medical condition and Ondansetron   Airway Management Planned: Natural Airway and Nasal Cannula  Additional Equipment:   Intra-op Plan:   Post-operative Plan:   Informed Consent: I have reviewed the patients History and Physical, chart, labs and discussed the procedure including the risks, benefits and alternatives for the proposed anesthesia with the patient or authorized representative who has indicated his/her understanding and acceptance.       Plan Discussed with: CRNA  Anesthesia Plan Comments: (Plan for spinal and GA with natural airway, LMA/GETA backup.  Patient consented for risks of anesthesia including but not limited to:  - adverse reactions to medications - damage to eyes, teeth, lips or other oral mucosa - nerve damage due to positioning  - sore throat  or hoarseness - headache, bleeding, infection, nerve damage 2/2 spinal - damage to heart, brain, nerves, lungs, other parts of body or loss of life  Informed patient about role of CRNA in peri- and intra-operative care.  Patient voiced understanding.)         Anesthesia Quick  Evaluation

## 2024-03-14 NOTE — Op Note (Signed)
 03/14/2024  10:09 AM  Patient:   Brian Callahan  Pre-Op Diagnosis:   Degenerative joint disease, left hip.  Post-Op Diagnosis:   Same.  Procedure:   Left total hip arthroplasty.  Surgeon:   DOROTHA Reyes Maltos, MD  Assistant:   Gustavo Level, PA-C  Anesthesia:   Attempted spinal --> GET  Findings:   As above.  Complications:   None  EBL:   175 cc  Fluids:   1000 cc crystalloid  UOP:   None  TT:   None  Drains:   None  Closure:   Staples  Implants:   Biomet press-fit system with a #11 laterally offset Echo femoral stem, a 54 mm acetabular shell with an E-poly hi-wall liner, and a 36 mm ceramic head with a -6 mm neck.  Brief Clinical Note:   The patient is an 83 year old male with a history of progressively worsening left hip/groin pain. His symptoms have progressed despite medications, activity modification, etc. His history and examination consistent with advanced degenerative joint disease, confirmed by plain radiographs. The patient presents at this time for a left total hip arthroplasty.   Procedure:   The patient was brought into the operating room. After an attempt at spinal anesthesia was unsuccessful, adequate general endotracheal intubation and anesthesia was obtained. The patient was repositioned in the right lateral decubitus position and secured using a lateral hip positioner. The left hip and lower extremity were prepped with ChloroPrep solution before being draped sterilely. Preoperative antibiotics were administered. A timeout was performed to verify the appropriate surgical site.    A standard posterior approach to the hip was made through an approximately 6-7 inch incision. The incision was carried down through the subcutaneous tissues to expose the gluteal fascia and proximal end of the iliotibial band. These structures were split the length of the incision and the Charnley self-retaining hip retractor placed. The bursal tissues were swept posteriorly to expose the  short external rotators. The anterior border of the piriformis tendon was identified and this plane developed down through the capsule to enter the joint. A flap of tissue was elevated off the posterior aspect of the femoral neck and greater trochanter and retracted posteriorly. This flap included the piriformis tendon, the short external rotators, and the posterior capsule. The soft tissues were elevated off the lateral aspect of the ilium and a large Steinmann pin placed bicortically.   With the left leg aligned over the right, a drill bit was placed into the greater trochanter parallel to the Steinmann pin and the distance between these two pins measured in order to optimize leg lengths postoperatively. The drill bit was removed and the hip dislocated. The piriformis fossa was debrided of soft tissues before the intramedullary canal was accessed through this point using a triple step reamer. The canal was reamed sequentially beginning with a #7 tapered reamer and progressing to a #11 tapered reamer. This provided excellent circumferential chatter. Using the appropriate guide, a femoral neck cut was made 10-12 mm above the lesser trochanter. The femoral head was removed.  Attention was directed to the acetabular side. The labrum was debrided circumferentially before the ligamentum teres was removed using a large curette. A line was drawn on the drapes corresponding to the native version of the acetabulum. This line was used as a guide while the acetabulum was reamed sequentially beginning with a 45 mm reamer and progressing to a 53 mm reamer. This provided excellent circumferential chatter. The 53 mm trial acetabulum was  positioned and found to fit quite well. Therefore, the 54 mm acetabular shell was selected and impacted into place with care taken to maintain the appropriate version. The trial high wall liner was inserted.  Attention was redirected to the femoral side. A box osteotome was used to establish  version before the canal was broached sequentially beginning with a #7 broach and progressing to a #11 broach. This was left in place and several trial reductions performed using both the standard and laterally offset neck options, as well as the -6 mm neck length. After removing the trial components, the manhole cover was placed into the apex of the acetabular shell and tightened securely. The permanent E-polyethylene hi-wall liner was impacted into the acetabular shell and its locking mechanism verified using a quarter-inch osteotome. Next, the #11 laterally offset femoral stem was impacted into place with care taken to maintain the appropriate version. A repeat trial reduction was performed using the -6 mm neck length. The -6 mm neck length demonstrated excellent stability both in extension and external rotation as well as with flexion to 90 and internal rotation beyond 70. It also was stable in the position of sleep. In addition, leg lengths appeared to be restored appropriately, both by reassessing the position of the right leg over the left, as well as by measuring the distance between the Steinmann pin and the drill bit. The 36 mm ceramic head with the -6 mm neck was impacted onto the stem of the femoral component. The Morse taper locking mechanism was verified using manual distraction before the head was relocated and placed through a range of motion with the findings as described above.  The wound was copiously irrigated with sterile saline solution via the jet lavage system before the peri-incisional and pericapsular tissues were injected with a cocktail of 20 cc of Exparel , 30 cc of 0.5% Sensorcaine , 2 cc of Kenalog  40 (80 mg), and 30 mg of Toradol  diluted out to 90 cc with normal saline to help with postoperative analgesia. The posterior flap was reapproximated to the posterior aspect of the greater trochanter using #2 Tycron interrupted sutures placed through drill holes. Several additional #2  Tycron interrupted sutures were used to reinforce this layer of closure. The iliotibial band was reapproximated using #1 Vicryl interrupted sutures before the gluteal fascia was closed using a running #0 Vicryl suture. The subcutaneous tissues were closed in several layers using 2-0 Vicryl interrupted sutures before the skin was closed using staples. A sterile occlusive dressing was applied to the wound. The patient was then rolled back into the supine position on his hospital bed before being awakened, extubated, and returned to the recovery room in satisfactory condition after tolerating the procedure well.

## 2024-03-14 NOTE — Evaluation (Signed)
 Physical Therapy Evaluation Patient Details Name: Brian Callahan MRN: [id number] DOB: [birth year] Today's Date: 03/14/2024  History of Present Illness  Patient is a 83 year old male with degenerative joint disease of left hip s/p left total hip arthroplasty. PMH:  diastolic dysfunction, pulmonary hypertension, cerebellar infarct, chronic cerebral microvascular disease, aortic insufficiency, aortic root dilatation, aortic atherosclerosis, angina, remote mesenteric artery embolism, HTN, HLD, T2DM, COPD, right diaphragm paralysis, prostate CA, psoriatic arthritis.  Clinical Impression  Patient is agreeable to PT evaluation. He lives with his spouse. He reports using a rolling walker for ambulation for the past 2 weeks with a history of falls.  Today the patient required Min A for bed mobility and transfers. Education on posterior hip precautions. Gait training initiated with rolling walker, and patient walked 76ft with CGA. He is hopeful for return home tomorrow if possible with assistance from family as needed. PT will continue to follow to maximize independence and decrease caregiver burden.       If plan is discharge home, recommend the following: Assist for transportation;Help with stairs or ramp for entrance   Can travel by private vehicle        Equipment Recommendations Rolling walker (2 wheels);BSC/3in1  Recommendations for Other Services       Functional Status Assessment Patient has had a recent decline in their functional status and demonstrates the ability to make significant improvements in function in a reasonable and predictable amount of time.     Precautions / Restrictions Precautions Precautions: Posterior Hip;Fall Precaution Booklet Issued: Yes (comment) Recall of Precautions/Restrictions: Impaired Restrictions Weight Bearing Restrictions Per Provider Order: Yes LLE Weight Bearing Per Provider Order: Weight bearing as tolerated      Mobility  Bed Mobility Overal  bed mobility: Needs Assistance Bed Mobility: Supine to Sit     Supine to sit: Min assist, HOB elevated, Used rails     General bed mobility comments: assistance for trunk LLE support. cues for technique. education provided on posterior hip precuations with handout provided    Transfers Overall transfer level: Needs assistance Equipment used: Rolling walker (2 wheels) Transfers: Sit to/from Stand Sit to Stand: Min assist, From elevated surface           General transfer comment: cues for positioning of LLE with sitting and standing. lifting assistance required for standing    Ambulation/Gait Ambulation/Gait assistance: Contact guard assist Gait Distance (Feet): 60 Feet Assistive device: Rolling walker (2 wheels) Gait Pattern/deviations: Step-to pattern, Decreased stance time - left, Decreased stride length Gait velocity: decreased     General Gait Details: reinforcement of rolling walker positioning. education provided for weight bearing status and for hip precuations.  Stairs            Wheelchair Mobility     Tilt Bed    Modified Rankin (Stroke Patients Only)       Balance Overall balance assessment: Needs assistance Sitting-balance support: Feet supported Sitting balance-Leahy Scale: Fair     Standing balance support: Bilateral upper extremity supported Standing balance-Leahy Scale: Poor Standing balance comment: heavy reliance on rolling walker for support in standing.                             Pertinent Vitals/Pain Pain Assessment Pain Assessment: 0-10 Pain Score: 4  Pain Location: L hip Pain Descriptors / Indicators: Sore Pain Intervention(s): Limited activity within patient's tolerance, Monitored during session, Premedicated before session, Repositioned    Home Living  Family/patient expects to be discharged to:: Private residence Living Arrangements: Spouse/significant other Available Help at Discharge: Family Type of Home:  House Home Access: Stairs to enter Entrance Stairs-Rails: Left Entrance Stairs-Number of Steps: 4     Home Equipment: Rolling Walker (2 wheels);BSC/3in1;Grab bars - tub/shower Additional Comments: DME has been passed down from other family members and has been heavily used    Prior Function Prior Level of Function : Needs assist             Mobility Comments: Mod I with rolling walker for the past 2 weeks for ambulation. prior to that was using a single point cane. ADLs Comments: spouse assists with lower body dressing and intermittently with bathing     Extremity/Trunk Assessment   Upper Extremity Assessment Upper Extremity Assessment: Overall WFL for tasks assessed    Lower Extremity Assessment Lower Extremity Assessment: LLE deficits/detail LLE Deficits / Details: patient able to weight bear without knee buckling. can perform ankle pumps in gravity eliminated position       Communication   Communication Communication: Impaired Factors Affecting Communication: Hearing impaired    Cognition Arousal: Alert Behavior During Therapy: WFL for tasks assessed/performed   PT - Cognitive impairments: No apparent impairments                         Following commands: Intact       Cueing Cueing Techniques: Verbal cues, Visual cues     General Comments      Exercises     Assessment/Plan    PT Assessment Patient needs continued PT services  PT Problem List Decreased strength;Decreased range of motion;Decreased activity tolerance;Decreased balance;Decreased mobility;Pain;Decreased knowledge of precautions       PT Treatment Interventions DME instruction;Gait training;Stair training;Functional mobility training;Therapeutic activities;Therapeutic exercise;Balance training;Patient/family education    PT Goals (Current goals can be found in the Care Plan section)  Acute Rehab PT Goals Patient Stated Goal: home tomorrow PT Goal Formulation: With  patient Time For Goal Achievement: 03/28/24 Potential to Achieve Goals: Good    Frequency BID     Co-evaluation               AM-PAC PT 6 Clicks Mobility  Outcome Measure Help needed turning from your back to your side while in a flat bed without using bedrails?: A Little Help needed moving from lying on your back to sitting on the side of a flat bed without using bedrails?: A Little Help needed moving to and from a bed to a chair (including a wheelchair)?: A Little Help needed standing up from a chair using your arms (e.g., wheelchair or bedside chair)?: A Little Help needed to walk in hospital room?: A Little Help needed climbing 3-5 steps with a railing? : A Little 6 Click Score: 18    End of Session   Activity Tolerance: Patient tolerated treatment well Patient left: in chair;with call bell/phone within reach;with chair alarm set Nurse Communication: Mobility status PT Visit Diagnosis: Difficulty in walking, not elsewhere classified (R26.2);Other abnormalities of gait and mobility (R26.89)    Time: 1429-1500 PT Time Calculation (min) (ACUTE ONLY): 31 min   Charges:   PT Evaluation $PT Eval Low Complexity: 1 Low PT Treatments $Gait Training: 8-22 mins PT General Charges $$ ACUTE PT VISIT: 1 Visit         Randine Essex, PT, MPT   Randine LULLA Essex 03/14/2024, 3:26 PM

## 2024-03-14 NOTE — Anesthesia Procedure Notes (Signed)
 Spinal  Patient location during procedure: OR Start time: 03/14/2024 7:36 AM End time: 03/14/2024 7:50 AM Reason for block: surgical anesthesia Staffing Performed: anesthesiologist and resident/CRNA  Performed by: Bonnetta Jimmey SAUNDERS, CRNA Authorized by: Mazzoni, Andrea, MD   Preanesthetic Checklist Completed: patient identified, IV checked, site marked, risks and benefits discussed, surgical consent, monitors and equipment checked, pre-op evaluation and timeout performed Spinal Block Patient position: sitting Prep: ChloraPrep Patient monitoring: heart rate, cardiac monitor, continuous pulse ox and blood pressure Location: L3-4 Needle Needle type: Whitacre and Pencan  Assessment Events: failed spinal Additional Notes First attempt by SRNA with Pencan, second by MDA with Pencan, third by MDA with whitacre

## 2024-03-14 NOTE — H&P (Signed)
 History of Present Illness:  Brian Callahan is a 83 y.o. male who has severe Left hip pain. Patient reports a longstanding history of left hip discomfort. He states that it has gotten worse over the last 5 months, and has greatly affected his ability to ambulate long distances and perform his ADLs as he would like. He states that his pain is made worse anytime that he attempts to ambulate or stand for a prolonged period of time. He presents today utilizing a wheelchair for ambulation assistance. He does report having some numbness and tingling in his leg, but does report a history of neuropathy. He states that the pain localizes along the posterior and anterior portion of his groin and is made worse with any attempted hip movement. He has failed conservative treatment including activity modification, ambulation assistive device use, Tylenol  and intra-articular corticosteroids. 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 Patient does report a significant history of chronic conditions. Patient has noted to have valvular cardiac issues as well as significant peripheral vascular disease. 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 No previous history of DVTs or clots. He is noted to be a diabetic, last A1c was at 6.6. No previous surgeries on this hip. He also does report a significant history of multiple GI bleeds secondary to potential use of NSAIDs versus opioid medications. He has had an IVC filter placed.  Social Hx: Patient lives at home with his wife will be helping look after him postoperatively. He does admit to 3 or 4 standard alcoholic drinks per day. Denies any illicit drug use. No nicotine use or smoking. States that he quit smoking about 33 years ago.  Allergies: Imdur [Isosorbide Mononitrate] Headache  Shellfish Containing Products Other (gout)  Trazodone Hcl Diarrhea   Current Outpatient Medications: acetaminophen  (TYLENOL ) 500 mg capsule Take  1,000 mg by mouth every 6 (six) hours as needed for Pain  ANTIOX #8/OM3/DHA/EPA/LUT/ZEAX (PRESERVISION AREDS 2, OMEGA-3, ORAL) Take 1 tablet by mouth once daily  clobetasoL  (TEMOVATE ) 0.05 % cream  COSENTYX UNOREADY PEN 300 mg/2 mL PnIj Inject 300 mg as directed  cyanocobalamin (VITAMIN B12) 1000 MCG tablet Take 1 tablet (1,000 mcg total) by mouth once daily 90 tablet 3  enalapril  (VASOTEC ) 5 MG tablet TAKE 1 TABLET BY MOUTH TWICE DAILY 200 tablet 2  ezetimibe  (ZETIA ) 10 mg tablet TAKE 1 TABLET BY MOUTH ONCE DAILY 100 tablet 2  imipramine  (TOFRANIL ) 25 MG tablet Take 1 tablet (25 mg total) by mouth at bedtime 90 tablet 3  ipratropium-albuteroL  (DUO-NEB) nebulizer solution Take 3 mLs by nebulization 2 (two) times daily for 360 days 360 mL 11  melatonin 5 mg Cap Take 1 capsule by mouth at bedtime as needed  metFORMIN  (GLUCOPHAGE ) 500 MG tablet TAKE 1 TABLET BY MOUTH TWICE DAILY WITH MEALS 200 tablet 2  metoprolol  SUCCinate (TOPROL -XL) 50 MG XL tablet TAKE 1 AND 1/2 TABLETS BY MOUTH ONCE DAILY 150 tablet 2  mirtazapine (REMERON) 7.5 MG tablet Take 1 tablet (7.5 mg total) by mouth at bedtime 30 tablet 2  oxyCODONE  (ROXICODONE ) 5 MG immediate release tablet Take 0.5-1 tablets (2.5-5 mg total) by mouth every 12 (twelve) hours as needed for up to 5 days 15 tablet 0  tamsulosin  (FLOMAX ) 0.4 mg capsule Take 0.4 mg by mouth 2 (two) times daily Take 30 minutes after same meal each day.  tildrakizumab-asmn (ILUMYA) 100 mg/mL Syrg Inject subcutaneously  VIT C/CHERRY & CELERY EX/GRP E (TART CHERRY  ORAL) Take by mouth.   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 On Humira.   Past Surgical History:  Colectomy, partial 1998. Dr. cerame. Due to obstruction. Non-cancerous 11/15/1996  Bursitis of his knee 2009  GI bleed 2013  EGD 12/05/2013 (No repeat per RTE)  EGD 10/14/2016 (Gastritis: No repeat per RTE)  CHOLECYSTECTOMY 10/18/2017 (Dr Lucas Catchings)  COLONOSCOPY 09/27/2019 (Diverticulosis/PHx CP/No Repeat due to age/TKT)  EGD 09/27/2019 (Gastritis/No Yeast found/No Repeat/TKT)  Flex Sig @ [facility] 09/15/2023 (Tubular adenomas/No repeat/TKT)  BIOPSY PROSTATE NEEDLE/PUNCH ( urology, 2 years ago - no cancer (BPH))  COLONOSCOPY 12/05/2013, 07/11/2012, 10/31/1996 (Dr. FABIENE Holmes @ [facility] - Adenomatous Polyp: CBF 11/2018)  HERNIA REPAIR x 2 in the mid 90's  neck/spine surgery - 2007  SURGERY SCROTAL / TESTICULAR (surgery for fibrous growth)    Physical Exam:  Ht:170.2 cm (5' 7) Wt:88 kg (194 lb) BMI: Body mass index is 30.38 kg/m.  General/Constitutional: No apparent distress: well-nourished and well developed. Eyes: Pupils equal, round with synchronous movement. Lymphatic: No palpable adenopathy. Respiratory: Patient has good chest rise and fall with inspiration and expiration. All lung fields are clear to auscultation bilaterally. There is no Rales, rhonchi or wheezes appreciated. Cardiovascular: Upon auscultation there is a regular rate and rhythm without any murmurs, rubs, gallops or heaves appreciated. There does not appear to be any swelling down the lower extremities. Posterior tibial pulses appreciated bilaterally, 2+. Integumentary: No impressive skin lesions present, except as noted in detailed exam. Neuro/Psych: Normal mood and affect, oriented to person, place and time. Musculoskeletal: see exam below  Left hip exam: Upon inspection of the patient's left hip, there does not appear to be any noticeable swelling, erythema or gross deformity. Of note, patient  noted to have significant peripheral vascular changes with underlying rubor and some skin breakdown across his bilateral lower extremities.  Pelvic tilt: Negative Limb lengths: Equal with the patient standing Soft tissue swelling: Negative Erythema: Negative Crepitance: Negative Tenderness: Greater trochanter is nontender to palpation. Moderate pain is elicited by axial compression or extremes of rotation. Atrophy: No atrophy. Okay to Fair hip flexor and abductor strength. Range of Motion:  EXT/FLEX: -/85  ADD/ABD: -/20  IR/ER: 10/25  Patient is neurovascularly intact to all dermatomes extending down there Left lower extremity to all dermatomes. Posterior tibial pulses were appreciated, 2+.  Imaging: Previous images from 10/29/2023 were reviewed of the patient's left hip. 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 Subchondral changes are appreciated. Osteophyte formation is present. Noted to have severe calcific changes on x-ray to descending vasculature. No signs of underlying AVN. No fractures, lytic lesions or gross deformities appreciated on films.  Assessment:  Primary osteoarthritis of left hip.  Plan: The treatment options were discussed with the patient and his wife. In addition, patient educational materials were provided regarding the diagnosis and treatment options. The patient is quite frustrated by his worsening symptoms and function limitations, and is ready to consider more aggressive treatment options. Therefore, I have recommended a surgical procedure, specifically a left total hip arthroplasty. The procedure was discussed with the patient,  as were the potential risks (including bleeding, infection, nerve and/or blood vessel injury, persistent or recurrent pain, loosening and/or failure of the components, dislocation, leg length inequality, need for further surgery, blood clots, strokes, heart attacks and/or  arhythmias, pneumonia, etc.) and benefits. The patient states his/her understanding and wishes to proceed. All of the patient's questions and concerns were answered. He can call any time with further concerns. He will follow up post-surgery, routine.   I    H&P reviewed and patient re-examined. No changes.

## 2024-03-15 ENCOUNTER — Encounter: Payer: Self-pay | Admitting: Surgery

## 2024-03-15 DIAGNOSIS — M1612 Unilateral primary osteoarthritis, left hip: Secondary | ICD-10-CM | POA: Diagnosis not present

## 2024-03-15 LAB — BASIC METABOLIC PANEL WITH GFR
Anion gap: 7 (ref 5–15)
BUN: 9 mg/dL (ref 8–23)
CO2: 27 mmol/L (ref 22–32)
Calcium: 8.4 mg/dL — ABNORMAL LOW (ref 8.9–10.3)
Chloride: 104 mmol/L (ref 98–111)
Creatinine, Ser: 0.62 mg/dL (ref 0.61–1.24)
GFR, Estimated: >60 mL/min (ref >60)
Glucose, Bld: 189 mg/dL — ABNORMAL HIGH (ref 70–99)
Potassium: 3.6 mmol/L (ref 3.5–5.1)
Sodium: 138 mmol/L (ref 135–145)

## 2024-03-15 LAB — CBC
HCT: 30.7 % — ABNORMAL LOW (ref 39.0–52.0)
Hemoglobin: 10.9 g/dL — ABNORMAL LOW (ref 13.0–17.0)
MCH: 35.5 pg — ABNORMAL HIGH (ref 26.0–34.0)
MCHC: 35.5 g/dL (ref 30.0–36.0)
MCV: 100 fL (ref 80.0–100.0)
Platelets: 144 K/uL — ABNORMAL LOW (ref 150–400)
RBC: 3.07 MIL/uL — ABNORMAL LOW (ref 4.22–5.81)
RDW: 14.4 % (ref 11.5–15.5)
WBC: 7 K/uL (ref 4.0–10.5)
nRBC: 0 % (ref 0.0–0.2)

## 2024-03-15 MED ORDER — METFORMIN HCL 500 MG PO TABS
ORAL_TABLET | ORAL | Status: AC
  Filled 2024-03-15: qty 1

## 2024-03-15 MED ORDER — EZETIMIBE 10 MG PO TABS
ORAL_TABLET | ORAL | Status: AC
  Filled 2024-03-15: qty 1

## 2024-03-15 NOTE — Progress Notes (Signed)
 DISCHARGE NOTE:   Pt dc with Iv removed and dc instructions given. Pt has both TED hose on and in place. Pt received a 3 in 1 and RW delivered to hospital room. Pt given medication scripts. Pt voices no questions or concerns at this time. Pt wheeled down to medical mall entrance by staff and pt's wife provided transportation.

## 2024-03-15 NOTE — Progress Notes (Signed)
 Subjective: 1 Day Post-Op Procedure(s) (LRB): ARTHROPLASTY, HIP, TOTAL,POSTERIOR APPROACH (Left) Patient reports pain as mild.   Patient is well, and has had no acute complaints or problems Plan is to go Home after hospital stay. Negative for chest pain and shortness of breath Fever: no Gastrointestinal:Negative for nausea and vomiting  Objective: Vital signs in last 24 hours: Temp:  [97.8 F (36.6 C)-98.9 F (37.2 C)] 98.2 F (36.8 C) (08/20 0728) Pulse Rate:  [70-84] 70 (08/20 0728) Resp:  [12-21] 17 (08/20 0728) BP: (109-182)/(60-96) 147/70 (08/20 0728) SpO2:  [84 %-100 %] 94 % (08/20 0728)  Intake/Output from previous day:  Intake/Output Summary (Last 24 hours) at 03/15/2024 0810 Last data filed at 03/14/2024 1942 Gross per 24 hour  Intake 2156.65 ml  Output 900 ml  Net 1256.65 ml    Intake/Output this shift: No intake/output data recorded.  Labs: Recent Labs    03/15/24 0618  HGB 10.9*   Recent Labs    03/15/24 0618  WBC 7.0  RBC 3.07*  HCT 30.7*  PLT 144*   Recent Labs    03/15/24 0618  NA 138  K 3.6  CL 104  CO2 27  BUN 9  CREATININE 0.62  GLUCOSE 189*  CALCIUM 8.4*   No results for input(s): LABPT, INR in the last 72 hours.   EXAM General - Patient is Alert, Appropriate, and Oriented Extremity - ABD soft Neurovascular intact Dorsiflexion/Plantar flexion intact Incision: dressing C/D/I No cellulitis present Dressing/Incision - clean, dry, no drainage noted to the left hip honeycomb dressing. Motor Function - intact, moving foot and toes well on exam.  Abdomen soft with intact bowel sounds.  Past Medical History:  Diagnosis Date   Anemia    Aortic atherosclerosis (HCC)    Aortic insufficiency    a. 06/2017 Echo: EF 50% with mild LVH, nl RV function, moderate AI, and mild MR/TR/PR.   Aortic root dilatation (HCC)    Benign fibroma of prostate    BPH with obstruction/lower urinary tract symptoms    Cerebellar infarct [facility])     Cerebral microvascular disease    Chest pain    a. 2010 Cath ([facility]): reportedly nl; b. 06/2017 [facility]): EF 63%, mild inf wall ischemia->med rx.   Chronic dyspnea    Chronic GI bleeding    Colon polyps    COPD (chronic obstructive pulmonary disease) (HCC)    Depression    Diaphragm paralysis (RIGHT)    Diastolic dysfunction    Diverticulosis    DM (diabetes mellitus), type 2 (HCC)    Elevated PSA    Embolus of mesenteric artery (HCC)    Gastritis    GERD (gastroesophageal reflux disease)    Gout    H/O hernia repair mid-90s   x2   History of prostate cancer s/p IMRT    History of [place] spotted fever    HLD (hyperlipidemia)    HTN (hypertension)    Incomplete bladder emptying    Insomnia    a.) takes melatonin PRN   Long term current use of immunosuppressive drug    a.) IL-17A for psoriatic arthritis (secukinumab)   Macular degeneration    Obesity    Osteoarthritis of hip    Psoriasis    Psoriatic arthritis (HCC)    a.) on IL-17A inhibitor (secukinumab)   Pulmonary hypertension (HCC)    S/P insertion of IVC (inferior vena caval) filter    SBO (small bowel obstruction); s/p partial colectomy 1998   Status post bilateral  cataract extraction    Urinary frequency     Assessment/Plan: 1 Day Post-Op Procedure(s) (LRB): ARTHROPLASTY, HIP, TOTAL,POSTERIOR APPROACH (Left) Principal Problem:   Status post total hip replacement, left Active Problems:   Diastolic dysfunction  Estimated body mass index is 29.77 kg/m as calculated from the following:   Height as of this encounter: 5' 8 (1.727 m).   Weight as of this encounter: 88.8 kg. Advance diet Up with therapy D/C IV fluids when tolerating po intake.  Labs and vitals reviewed. Up with therapy today. Plan for d/c home today.  DVT Prophylaxis - TED hose and Eliquis  Weight-Bearing as tolerated to left leg  J. Gustavo Level, PA-C [facility] 03/15/2024, 8:10 AM

## 2024-03-15 NOTE — Plan of Care (Signed)
  Problem: Education: Goal: Knowledge of General Education information will improve Description: Including pain rating scale, medication(s)/side effects and non-pharmacologic comfort measures 03/15/2024 0745 by Alford Nidia CROME, LPN Outcome: Progressing 03/14/2024 1900 by Alford Nidia CROME, LPN Outcome: Progressing   Problem: Health Behavior/Discharge Planning: Goal: Ability to manage health-related needs will improve Outcome: Progressing   Problem: Activity: Goal: Risk for activity intolerance will decrease Outcome: Progressing   Problem: Coping: Goal: Level of anxiety will decrease 03/15/2024 0745 by Alford Nidia L, LPN Outcome: Progressing 03/14/2024 1900 by Alford Nidia CROME, LPN Outcome: Progressing   Problem: Safety: Goal: Ability to remain free from injury will improve Outcome: Progressing

## 2024-03-15 NOTE — Progress Notes (Signed)
 Physical Therapy Treatment Patient Details Name: Brian Callahan MRN: [id number] DOB: 04-20-1941 Today's Date: 03/15/2024   History of Present Illness Patient is a 83 year old male with degenerative joint disease of left hip s/p left total hip arthroplasty. PMH:  diastolic dysfunction, pulmonary hypertension, cerebellar infarct, chronic cerebral microvascular disease, aortic insufficiency, aortic root dilatation, aortic atherosclerosis, angina, remote mesenteric artery embolism, HTN, HLD, T2DM, COPD, right diaphragm paralysis, prostate CA, psoriatic arthritis.    PT Comments  Pt was in bed upon arrival. He is A and O but HOH. Easily able to exit bed but heavy use of bed rail. Pt does have railing that spouse installed to home bed. Pt then proceeded to stand and ambulate without LOB. Slow, antalgic, step to, gait sequencing. Pt demonstrated abilities to safely perform ascending/descending stairs to simulate home entry. Pt is cleared from an acute PT standpoint for safe DC home with HHPT to follow.    If plan is discharge home, recommend the following: Assist for transportation;Help with stairs or ramp for entrance     Equipment Recommendations  Rolling walker (2 wheels);BSC/3in1       Precautions / Restrictions Precautions Precautions: Posterior Hip;Fall Precaution Booklet Issued: Yes (comment) Recall of Precautions/Restrictions: Intact Restrictions Weight Bearing Restrictions Per Provider Order: Yes LLE Weight Bearing Per Provider Order: Weight bearing as tolerated     Mobility  Bed Mobility Overal bed mobility: Needs Assistance Bed Mobility: Supine to Sit  Supine to sit: Used rails, Supervision  General bed mobility comments: Increased time required with vcs for step by step sequncing. pt has bedrail at home but is planning to sleep in recliner for first few nights at DC    Transfers Overall transfer level: Needs assistance Equipment used: Rolling walker (2 wheels) Transfers: Sit  to/from Stand Sit to Stand: Supervision  General transfer comment: pt does require Vcs for technique but did not require physical assistance to stand.    Ambulation/Gait Ambulation/Gait assistance: Supervision Gait Distance (Feet): 150 Feet Assistive device: Rolling walker (2 wheels) Gait Pattern/deviations: Step-to pattern, Decreased stance time - left, Decreased stride length Gait velocity: decreased  General Gait Details: pt ambulated ~ 150 ft with RW. no LOB or safety concerns with use of RW. Encouraged pt to stand more erect and to attempt step throughgait sequencing. Pt only truely able to tolerate step too pattern but did not have ny safety concerns with balance.   Stairs Stairs: Yes Stairs assistance: Supervision Stair Management: One rail Right, Step to pattern Number of Stairs: 4 General stair comments: Pt demonstrated safe performance of ascending/descending stairs to simulate home entry/exit    Balance Overall balance assessment: Needs assistance Sitting-balance support: Feet supported Sitting balance-Leahy Scale: Good     Standing balance support: Bilateral upper extremity supported, During functional activity, Reliant on assistive device for balance Standing balance-Leahy Scale: Good Standing balance comment: heavy reliance on rolling walker for support in standing.       Communication Communication Communication: Impaired Factors Affecting Communication: Hearing impaired  Cognition Arousal: Alert Behavior During Therapy: WFL for tasks assessed/performed   PT - Cognitive impairments: No apparent impairments    PT - Cognition Comments: pt is A and O x4 but HOH Following commands: Intact      Cueing Cueing Techniques: Verbal cues     General Comments General comments (skin integrity, edema, etc.): Issued HEP and reviewed with pt      Pertinent Vitals/Pain Pain Assessment Pain Assessment: 0-10 Pain Score: 4  Pain Location: L  hip Pain Descriptors /  Indicators: Sore Pain Intervention(s): Limited activity within patient's tolerance, Monitored during session, Premedicated before session, Repositioned, Ice applied     PT Goals (current goals can now be found in the care plan section) Acute Rehab PT Goals Patient Stated Goal: go home today Progress towards PT goals: Progressing toward goals    Frequency    BID       AM-PAC PT 6 Clicks Mobility   Outcome Measure  Help needed turning from your back to your side while in a flat bed without using bedrails?: A Little Help needed moving from lying on your back to sitting on the side of a flat bed without using bedrails?: A Little Help needed moving to and from a bed to a chair (including a wheelchair)?: A Little Help needed standing up from a chair using your arms (e.g., wheelchair or bedside chair)?: A Little Help needed to walk in hospital room?: A Little Help needed climbing 3-5 steps with a railing? : A Little 6 Click Score: 18    End of Session   Activity Tolerance: Patient tolerated treatment well Patient left: in chair;with call bell/phone within reach;with chair alarm set Nurse Communication: Mobility status PT Visit Diagnosis: Difficulty in walking, not elsewhere classified (R26.2);Other abnormalities of gait and mobility (R26.89)     Time: 9272-9242 PT Time Calculation (min) (ACUTE ONLY): 30 min  Charges:    $Gait Training: 8-22 mins $Therapeutic Exercise: 8-22 mins PT General Charges $$ ACUTE PT VISIT: 1 Visit                     Rankin Essex PTA 03/15/24, 9:29 AM

## 2024-03-29 ENCOUNTER — Other Ambulatory Visit: Payer: Self-pay | Admitting: Student

## 2024-03-29 ENCOUNTER — Ambulatory Visit
Admission: RE | Admit: 2024-03-29 | Discharge: 2024-03-29 | Disposition: A | Discharge status: Home / Self Care | Source: Ambulatory Visit | Attending: Student | Admitting: Student

## 2024-03-29 DIAGNOSIS — M7989 Other specified soft tissue disorders: Secondary | ICD-10-CM | POA: Insufficient documentation

## 2024-03-29 DIAGNOSIS — Z96642 Presence of left artificial hip joint: Secondary | ICD-10-CM

## 2024-04-13 ENCOUNTER — Other Ambulatory Visit (INDEPENDENT_AMBULATORY_CARE_PROVIDER_SITE_OTHER): Payer: Self-pay | Admitting: Vascular Surgery

## 2024-04-13 DIAGNOSIS — Z95828 Presence of other vascular implants and grafts: Secondary | ICD-10-CM

## 2024-04-17 ENCOUNTER — Encounter (INDEPENDENT_AMBULATORY_CARE_PROVIDER_SITE_OTHER): Payer: Self-pay | Admitting: Nurse Practitioner

## 2024-04-17 ENCOUNTER — Ambulatory Visit (INDEPENDENT_AMBULATORY_CARE_PROVIDER_SITE_OTHER)

## 2024-04-17 ENCOUNTER — Ambulatory Visit (INDEPENDENT_AMBULATORY_CARE_PROVIDER_SITE_OTHER): Admitting: Nurse Practitioner

## 2024-04-17 VITALS — BP 122/80 | HR 65

## 2024-04-17 DIAGNOSIS — E118 Type 2 diabetes mellitus with unspecified complications: Secondary | ICD-10-CM | POA: Diagnosis not present

## 2024-04-17 DIAGNOSIS — I10 Essential (primary) hypertension: Secondary | ICD-10-CM | POA: Diagnosis not present

## 2024-04-17 DIAGNOSIS — K922 Gastrointestinal hemorrhage, unspecified: Secondary | ICD-10-CM | POA: Diagnosis not present

## 2024-04-17 DIAGNOSIS — Z95828 Presence of other vascular implants and grafts: Secondary | ICD-10-CM | POA: Diagnosis not present

## 2024-04-17 NOTE — Progress Notes (Unsigned)
 Subjective:    Patient ID: Brian Callahan, male    DOB: 04-18-1941, 83 y.o.   MRN: [id number] Chief Complaint  Patient presents with  . Follow-up    HPI  Review of Systems     Objective:   Physical Exam  BP 122/80   Pulse 65   Past Medical History:  Diagnosis Date  . Anemia   . Aortic atherosclerosis   . Aortic insufficiency    a. 06/2017 Echo: EF 50% with mild LVH, nl RV function, moderate AI, and mild MR/TR/PR.  SABRA Aortic root dilatation   . Benign fibroma of prostate   . BPH with obstruction/lower urinary tract symptoms   . Cerebellar infarct (HCC)   . Cerebral microvascular disease   . Chest pain    a. 2010 Cath ([facility]): reportedly nl; b. 06/2017 [facility]): EF 63%, mild inf wall ischemia->med rx.  . Chronic dyspnea   . Chronic GI bleeding   . Colon polyps   . COPD (chronic obstructive pulmonary disease) (HCC)   . Depression   . Diaphragm paralysis (RIGHT)   . Diastolic dysfunction   . Diverticulosis   . DM (diabetes mellitus), type 2 (HCC)   . Elevated PSA   . Embolus of mesenteric artery   . Gastritis   . GERD (gastroesophageal reflux disease)   . Gout   . H/O hernia repair mid-90s   x2  . History of prostate cancer s/p IMRT   . History of [facility] spotted fever   . HLD (hyperlipidemia)   . HTN (hypertension)   . Incomplete bladder emptying   . Insomnia    a.) takes melatonin PRN  . Long term current use of immunosuppressive drug    a.) IL-17A for psoriatic arthritis (secukinumab)  . Macular degeneration   . Obesity   . Osteoarthritis of hip   . Psoriasis   . Psoriatic arthritis (HCC)    a.) on IL-17A inhibitor (secukinumab)  . Pulmonary hypertension (HCC)   . S/P insertion of IVC (inferior vena caval) filter   . SBO (small bowel obstruction); s/p partial colectomy 1998  . Status post bilateral cataract extraction   . Urinary frequency     Social History   Socioeconomic History  . Marital status: Married    Spouse name: Not on  file  . Number of children: Not on file  . Years of education: Not on file  . Highest education level: Not on file  Occupational History  . Not on file  Tobacco Use  . Smoking status: Former    Current packs/day: 0.00    Average packs/day: 0.3 packs/day for 10.0 years (2.5 ttl pk-yrs)    Types: Cigarettes    Start date: 10/11/1980    Quit date: 10/12/1990    Years since quitting: 33.5    Passive exposure: Past  . Smokeless tobacco: Never  . Tobacco comments:    Quit 20 years ago  Vaping Use  . Vaping status: Never Used  Substance and Sexual Activity  . Alcohol use: Yes    Alcohol/week: 14.0 standard drinks of alcohol    Types: 14 Shots of liquor per week    Comment: 2 whiskeys per night to help him sleep  . Drug use: No  . Sexual activity: Not on file  Other Topics Concern  . Not on file  Social History Narrative   Lives locally with wife.  Does not routinely exercise.   Social Drivers of Corporate investment banker  Strain: Low Risk  (09/02/2023)   Received from [facility]   Overall Financial Resource Strain (CARDIA)   . Difficulty of Paying Living Expenses: Not hard at all  Food Insecurity: No Food Insecurity (03/14/2024)   Hunger Vital Sign   . Worried About Programme researcher, broadcasting/film/video in the Last Year: Never true   . Ran Out of Food in the Last Year: Never true  Transportation Needs: No Transportation Needs (03/14/2024)   PRAPARE - Transportation   . Lack of Transportation (Medical): No   . Lack of Transportation (Non-Medical): No  Physical Activity: Not on file  Stress: Not on file  Social Connections: Socially Integrated (03/14/2024)   Social Connection and Isolation Panel   . Frequency of Communication with Friends and Family: Twice a week   . Frequency of Social Gatherings with Friends and Family: Twice a week   . Attends Religious Services: 1 to 4 times per year   . Active Member of Clubs or Organizations: Yes   . Attends Banker  Meetings: 1 to 4 times per year   . Marital Status: Married  Catering manager Violence: Not At Risk (03/14/2024)   Humiliation, Afraid, Rape, and Kick questionnaire   . Fear of Current or Ex-Partner: No   . Emotionally Abused: No   . Physically Abused: No   . Sexually Abused: No    Past Surgical History:  Procedure Laterality Date  . APPENDECTOMY    . BACK SURGERY     neck and spine 2007  . BIOPSY  09/15/2023   Procedure: BIOPSY;  Surgeon: [name], Ladell POUR, MD;  Location: [facility] ENDOSCOPY;  Service: Gastroenterology;;  . CATARACT EXTRACTION, BILATERAL Bilateral   . CERVICAL SPINE SURGERY  2007  . CHOLECYSTECTOMY N/A 10/18/2017   Procedure: LAPAROSCOPIC CHOLECYSTECTOMY;  Surgeon: Rodolph Romano, MD;  Location: [facility] ORS;  Service: General;  Laterality: N/A;  . COLONOSCOPY     lost 10 units of blood  . COLONOSCOPY WITH PROPOFOL  N/A 09/27/2019   Procedure: COLONOSCOPY WITH PROPOFOL ;  Surgeon: [name], Ladell POUR, MD;  Location: [facility] ENDOSCOPY;  Service: Gastroenterology;  Laterality: N/A;  . CYSTOSCOPY WITH INSERTION OF UROLIFT N/A 01/23/2020   Procedure: CYSTOSCOPY WITH INSERTION OF UROLIFT;  Surgeon: Twylla Glendia BROCKS, MD;  Location: [facility] ORS;  Service: Urology;  Laterality: N/A;  . ESOPHAGOGASTRODUODENOSCOPY N/A 10/14/2016   Procedure: ESOPHAGOGASTRODUODENOSCOPY (EGD);  Surgeon: Lamar ONEIDA Holmes, MD;  Location: [facility] ENDOSCOPY;  Service: Endoscopy;  Laterality: N/A;  . ESOPHAGOGASTRODUODENOSCOPY (EGD) WITH PROPOFOL  N/A 09/27/2019   Procedure: ESOPHAGOGASTRODUODENOSCOPY (EGD) WITH PROPOFOL ;  Surgeon: [name], Ladell POUR, MD;  Location: [facility] ENDOSCOPY;  Service: Gastroenterology;  Laterality: N/A;  . FLEXIBLE SIGMOIDOSCOPY N/A 09/15/2023   Procedure: FLEXIBLE SIGMOIDOSCOPY;  Surgeon: [name], Ladell POUR, MD;  Location: [facility] ENDOSCOPY;  Service: Gastroenterology;  Laterality: N/A;  DM  . HERNIA REPAIR Bilateral mid-90s   x2  inguinal  . IVC FILTER INSERTION N/A 03/06/2024   Procedure:  IVC FILTER INSERTION;  Surgeon: Marea Selinda RAMAN, MD;  Location: [facility] INVASIVE CV LAB;  Service: Cardiovascular;  Laterality: N/A;  . PARTIAL COLECTOMY  1998  . POLYPECTOMY  09/15/2023   Procedure: POLYPECTOMY;  Surgeon: [name], Ladell POUR, MD;  Location: [facility] ENDOSCOPY;  Service: Gastroenterology;;  . PROSTATE BIOPSY  2013  . SURGERY SCROTAL / TESTICULAR     for fibrous growth  . TOTAL HIP ARTHROPLASTY Left 03/14/2024   Procedure: ARTHROPLASTY, HIP, TOTAL,POSTERIOR APPROACH;  Surgeon: Edie Norleen PARAS, MD;  Location: [facility] ORS;  Service: Orthopedics;  Laterality: Left;    Family History  Problem Relation Age of Onset  . Kidney failure Brother   . Prostate cancer Neg Hx     Allergies  Allergen Reactions  . Isosorbide Nitrate Other (See Comments)    unkn  . Shellfish Allergy Other (See Comments)    gout  . Trazodone And Nefazodone Diarrhea       Latest Ref Rng & Units 03/15/2024    6:18 AM 03/09/2024   12:31 PM 10/28/2023   10:48 AM  CBC  WBC 4.0 - 10.5 K/uL 7.0  4.5  3.4   Hemoglobin 13.0 - 17.0 g/dL 89.0  86.9  87.8   Hematocrit 39.0 - 52.0 % 30.7  38.2  36.5   Platelets 150 - 400 K/uL 144  181  163       CMP     Component Value Date/Time   NA 138 03/15/2024 0618   NA 141 12/02/2013 0404   K 3.6 03/15/2024 0618   K 4.0 12/02/2013 0404   CL 104 03/15/2024 0618   CL 110 (H) 12/02/2013 0404   CO2 27 03/15/2024 0618   CO2 28 12/02/2013 0404   GLUCOSE 189 (H) 03/15/2024 0618   GLUCOSE 172 (H) 12/02/2013 0404   BUN 9 03/15/2024 0618   BUN 12 12/02/2013 0404   CREATININE 0.62 03/15/2024 0618   CREATININE 0.88 12/02/2013 0404   CALCIUM 8.4 (L) 03/15/2024 0618   CALCIUM 7.9 (L) 12/02/2013 0404   PROT 6.9 03/09/2024 1231   PROT 7.3 12/01/2013 2158   ALBUMIN 4.1 03/09/2024 1231   ALBUMIN 3.6 12/01/2013 2158   AST 28 03/09/2024 1231   AST 25 12/01/2013 2158   ALT 26 03/09/2024 1231   ALT 21 12/01/2013 2158   ALKPHOS 41 03/09/2024 1231   ALKPHOS 64 12/01/2013 2158   BILITOT  0.7 03/09/2024 1231   BILITOT 0.2 12/01/2013 2158   GFRNONAA >60 03/15/2024 0618   GFRNONAA >60 12/02/2013 0404     No results found.     Assessment & Plan:   1. Gastrointestinal hemorrhage, unspecified gastrointestinal hemorrhage type (Primary) The patient has done well status post joint replacement surgery.  Therefore, I recommend that we remove the IVC filter.  Risk and benefits were reviewed all questions answered patient has agreed to proceed.  Patient will continue to elevate to minimize swelling.  Given the history of DVT and the possibility of post phlebitic changes such as swelling and discomfort the patient should wear graduated compression stockings.  The compression should be worn on a daily basis.  In addition, behavioral modification including elevation during the day and avoidance of prolonged dependency is helpful.    The patient will follow-up with me after the filter removal.  2. Primary hypertension Continue antihypertensive medications as already ordered, these medications have been reviewed and there are no changes at this time.  3. Diabetes mellitus type 2 with complications (HCC) Continue hypoglycemic medications as already ordered, these medications have been reviewed and there are no changes at this time.  Hgb A1C to be monitored as already arranged by primary service   Current Outpatient Medications on File Prior to Visit  Medication Sig Dispense Refill  . acetaminophen  (TYLENOL ) 500 MG tablet Take 500-1,000 mg by mouth every 6 (six) hours as needed (for headache/minor pain.).    . apixaban  (ELIQUIS ) 2.5 MG TABS tablet Take Eliquis  twice daily for 2 weeks, then decrease to once daily for 4 weeks. 60 tablet 0  .  Calcium Carb-Cholecalciferol (CALCIUM-VITAMIN D3 PO) Take 1 tablet by mouth daily at 6 (six) AM.    . clobetasol  ointment (TEMOVATE ) 0.05 % Apply 1 application  topically daily as needed (psoriasis).    . cyanocobalamin (VITAMIN B12) 500 MCG  tablet Take 1,000 mcg by mouth daily.    . enalapril  (VASOTEC ) 5 MG tablet Take 1 tablet (5 mg total) by mouth 2 (two) times daily.    . ezetimibe  (ZETIA ) 10 MG tablet Take 10 mg by mouth every morning.    SABRA FEROSUL 325 (65 Fe) MG tablet Take 325 mg by mouth every morning.    . imipramine  (TOFRANIL ) 25 MG tablet Take 25 mg by mouth at bedtime.    SABRA ipratropium-albuterol  (DUONEB) 0.5-2.5 (3) MG/3ML SOLN Take 3 mLs by nebulization 2 (two) times daily.    . melatonin 5 MG TABS Take 5 mg by mouth at bedtime as needed.    . metFORMIN  (GLUCOPHAGE ) 500 MG tablet Take 1 tablet by mouth 2 (two) times daily.    . metoprolol  succinate (TOPROL -XL) 50 MG 24 hr tablet Take 1.5 tablets by mouth every morning.    . Misc Natural Products (TART CHERRY ADVANCED PO) Take 12 mg by mouth every other day.    . Multiple Vitamins-Minerals (PRESERVISION AREDS 2 PO) Take 1 capsule by mouth in the morning and at bedtime.    . oxyCODONE  (ROXICODONE ) 5 MG immediate release tablet Take 1-2 tablets (5-10 mg total) by mouth every 4 (four) hours as needed for moderate pain (pain score 4-6) or severe pain (pain score 7-10). 40 tablet 0  . Secukinumab (COSENTYX, 300 MG DOSE, ) Inject 300 mg into the skin every 30 (thirty) days.    . tamsulosin  (FLOMAX ) 0.4 MG CAPS capsule Take 1 capsule (0.4 mg total) by mouth 2 (two) times daily. TAKE 1 CAPSULE BY MOUTH  TWICE DAILY     No current facility-administered medications on file prior to visit.    There are no Patient Instructions on file for this visit. No follow-ups on file.   Deavion Dobbs E Yaniel Limbaugh, NP

## 2024-04-17 NOTE — H&P (View-Only) (Signed)
 Subjective:    Patient ID: Brian Callahan, male    DOB: 04-18-1941, 83 y.o.   MRN: [id number] Chief Complaint  Patient presents with  . Follow-up    HPI  Review of Systems     Objective:   Physical Exam  BP 122/80   Pulse 65   Past Medical History:  Diagnosis Date  . Anemia   . Aortic atherosclerosis   . Aortic insufficiency    a. 06/2017 Echo: EF 50% with mild LVH, nl RV function, moderate AI, and mild MR/TR/PR.  SABRA Aortic root dilatation   . Benign fibroma of prostate   . BPH with obstruction/lower urinary tract symptoms   . Cerebellar infarct (HCC)   . Cerebral microvascular disease   . Chest pain    a. 2010 Cath ([facility]): reportedly nl; b. 06/2017 [facility]): EF 63%, mild inf wall ischemia->med rx.  . Chronic dyspnea   . Chronic GI bleeding   . Colon polyps   . COPD (chronic obstructive pulmonary disease) (HCC)   . Depression   . Diaphragm paralysis (RIGHT)   . Diastolic dysfunction   . Diverticulosis   . DM (diabetes mellitus), type 2 (HCC)   . Elevated PSA   . Embolus of mesenteric artery   . Gastritis   . GERD (gastroesophageal reflux disease)   . Gout   . H/O hernia repair mid-90s   x2  . History of prostate cancer s/p IMRT   . History of [facility] spotted fever   . HLD (hyperlipidemia)   . HTN (hypertension)   . Incomplete bladder emptying   . Insomnia    a.) takes melatonin PRN  . Long term current use of immunosuppressive drug    a.) IL-17A for psoriatic arthritis (secukinumab)  . Macular degeneration   . Obesity   . Osteoarthritis of hip   . Psoriasis   . Psoriatic arthritis (HCC)    a.) on IL-17A inhibitor (secukinumab)  . Pulmonary hypertension (HCC)   . S/P insertion of IVC (inferior vena caval) filter   . SBO (small bowel obstruction); s/p partial colectomy 1998  . Status post bilateral cataract extraction   . Urinary frequency     Social History   Socioeconomic History  . Marital status: Married    Spouse name: Not on  file  . Number of children: Not on file  . Years of education: Not on file  . Highest education level: Not on file  Occupational History  . Not on file  Tobacco Use  . Smoking status: Former    Current packs/day: 0.00    Average packs/day: 0.3 packs/day for 10.0 years (2.5 ttl pk-yrs)    Types: Cigarettes    Start date: 10/11/1980    Quit date: 10/12/1990    Years since quitting: 33.5    Passive exposure: Past  . Smokeless tobacco: Never  . Tobacco comments:    Quit 20 years ago  Vaping Use  . Vaping status: Never Used  Substance and Sexual Activity  . Alcohol use: Yes    Alcohol/week: 14.0 standard drinks of alcohol    Types: 14 Shots of liquor per week    Comment: 2 whiskeys per night to help him sleep  . Drug use: No  . Sexual activity: Not on file  Other Topics Concern  . Not on file  Social History Narrative   Lives locally with wife.  Does not routinely exercise.   Social Drivers of Corporate investment banker  Strain: Low Risk  (09/02/2023)   Received from [facility]   Overall Financial Resource Strain (CARDIA)   . Difficulty of Paying Living Expenses: Not hard at all  Food Insecurity: No Food Insecurity (03/14/2024)   Hunger Vital Sign   . Worried About Programme researcher, broadcasting/film/video in the Last Year: Never true   . Ran Out of Food in the Last Year: Never true  Transportation Needs: No Transportation Needs (03/14/2024)   PRAPARE - Transportation   . Lack of Transportation (Medical): No   . Lack of Transportation (Non-Medical): No  Physical Activity: Not on file  Stress: Not on file  Social Connections: Socially Integrated (03/14/2024)   Social Connection and Isolation Panel   . Frequency of Communication with Friends and Family: Twice a week   . Frequency of Social Gatherings with Friends and Family: Twice a week   . Attends Religious Services: 1 to 4 times per year   . Active Member of Clubs or Organizations: Yes   . Attends Banker  Meetings: 1 to 4 times per year   . Marital Status: Married  Catering manager Violence: Not At Risk (03/14/2024)   Humiliation, Afraid, Rape, and Kick questionnaire   . Fear of Current or Ex-Partner: No   . Emotionally Abused: No   . Physically Abused: No   . Sexually Abused: No    Past Surgical History:  Procedure Laterality Date  . APPENDECTOMY    . BACK SURGERY     neck and spine 2007  . BIOPSY  09/15/2023   Procedure: BIOPSY;  Surgeon: [name], Ladell POUR, MD;  Location: [facility] ENDOSCOPY;  Service: Gastroenterology;;  . CATARACT EXTRACTION, BILATERAL Bilateral   . CERVICAL SPINE SURGERY  2007  . CHOLECYSTECTOMY N/A 10/18/2017   Procedure: LAPAROSCOPIC CHOLECYSTECTOMY;  Surgeon: Rodolph Romano, MD;  Location: [facility] ORS;  Service: General;  Laterality: N/A;  . COLONOSCOPY     lost 10 units of blood  . COLONOSCOPY WITH PROPOFOL  N/A 09/27/2019   Procedure: COLONOSCOPY WITH PROPOFOL ;  Surgeon: [name], Ladell POUR, MD;  Location: [facility] ENDOSCOPY;  Service: Gastroenterology;  Laterality: N/A;  . CYSTOSCOPY WITH INSERTION OF UROLIFT N/A 01/23/2020   Procedure: CYSTOSCOPY WITH INSERTION OF UROLIFT;  Surgeon: Twylla Glendia BROCKS, MD;  Location: [facility] ORS;  Service: Urology;  Laterality: N/A;  . ESOPHAGOGASTRODUODENOSCOPY N/A 10/14/2016   Procedure: ESOPHAGOGASTRODUODENOSCOPY (EGD);  Surgeon: Lamar ONEIDA Holmes, MD;  Location: [facility] ENDOSCOPY;  Service: Endoscopy;  Laterality: N/A;  . ESOPHAGOGASTRODUODENOSCOPY (EGD) WITH PROPOFOL  N/A 09/27/2019   Procedure: ESOPHAGOGASTRODUODENOSCOPY (EGD) WITH PROPOFOL ;  Surgeon: [name], Ladell POUR, MD;  Location: [facility] ENDOSCOPY;  Service: Gastroenterology;  Laterality: N/A;  . FLEXIBLE SIGMOIDOSCOPY N/A 09/15/2023   Procedure: FLEXIBLE SIGMOIDOSCOPY;  Surgeon: [name], Ladell POUR, MD;  Location: [facility] ENDOSCOPY;  Service: Gastroenterology;  Laterality: N/A;  DM  . HERNIA REPAIR Bilateral mid-90s   x2  inguinal  . IVC FILTER INSERTION N/A 03/06/2024   Procedure:  IVC FILTER INSERTION;  Surgeon: Marea Selinda RAMAN, MD;  Location: [facility] INVASIVE CV LAB;  Service: Cardiovascular;  Laterality: N/A;  . PARTIAL COLECTOMY  1998  . POLYPECTOMY  09/15/2023   Procedure: POLYPECTOMY;  Surgeon: [name], Ladell POUR, MD;  Location: [facility] ENDOSCOPY;  Service: Gastroenterology;;  . PROSTATE BIOPSY  2013  . SURGERY SCROTAL / TESTICULAR     for fibrous growth  . TOTAL HIP ARTHROPLASTY Left 03/14/2024   Procedure: ARTHROPLASTY, HIP, TOTAL,POSTERIOR APPROACH;  Surgeon: Edie Norleen PARAS, MD;  Location: [facility] ORS;  Service: Orthopedics;  Laterality: Left;    Family History  Problem Relation Age of Onset  . Kidney failure Brother   . Prostate cancer Neg Hx     Allergies  Allergen Reactions  . Isosorbide Nitrate Other (See Comments)    unkn  . Shellfish Allergy Other (See Comments)    gout  . Trazodone And Nefazodone Diarrhea       Latest Ref Rng & Units 03/15/2024    6:18 AM 03/09/2024   12:31 PM 10/28/2023   10:48 AM  CBC  WBC 4.0 - 10.5 K/uL 7.0  4.5  3.4   Hemoglobin 13.0 - 17.0 g/dL 89.0  86.9  87.8   Hematocrit 39.0 - 52.0 % 30.7  38.2  36.5   Platelets 150 - 400 K/uL 144  181  163       CMP     Component Value Date/Time   NA 138 03/15/2024 0618   NA 141 12/02/2013 0404   K 3.6 03/15/2024 0618   K 4.0 12/02/2013 0404   CL 104 03/15/2024 0618   CL 110 (H) 12/02/2013 0404   CO2 27 03/15/2024 0618   CO2 28 12/02/2013 0404   GLUCOSE 189 (H) 03/15/2024 0618   GLUCOSE 172 (H) 12/02/2013 0404   BUN 9 03/15/2024 0618   BUN 12 12/02/2013 0404   CREATININE 0.62 03/15/2024 0618   CREATININE 0.88 12/02/2013 0404   CALCIUM 8.4 (L) 03/15/2024 0618   CALCIUM 7.9 (L) 12/02/2013 0404   PROT 6.9 03/09/2024 1231   PROT 7.3 12/01/2013 2158   ALBUMIN 4.1 03/09/2024 1231   ALBUMIN 3.6 12/01/2013 2158   AST 28 03/09/2024 1231   AST 25 12/01/2013 2158   ALT 26 03/09/2024 1231   ALT 21 12/01/2013 2158   ALKPHOS 41 03/09/2024 1231   ALKPHOS 64 12/01/2013 2158   BILITOT  0.7 03/09/2024 1231   BILITOT 0.2 12/01/2013 2158   GFRNONAA >60 03/15/2024 0618   GFRNONAA >60 12/02/2013 0404     No results found.     Assessment & Plan:   1. Gastrointestinal hemorrhage, unspecified gastrointestinal hemorrhage type (Primary) The patient has done well status post joint replacement surgery.  Therefore, I recommend that we remove the IVC filter.  Risk and benefits were reviewed all questions answered patient has agreed to proceed.  Patient will continue to elevate to minimize swelling.  Given the history of DVT and the possibility of post phlebitic changes such as swelling and discomfort the patient should wear graduated compression stockings.  The compression should be worn on a daily basis.  In addition, behavioral modification including elevation during the day and avoidance of prolonged dependency is helpful.    The patient will follow-up with me after the filter removal.  2. Primary hypertension Continue antihypertensive medications as already ordered, these medications have been reviewed and there are no changes at this time.  3. Diabetes mellitus type 2 with complications (HCC) Continue hypoglycemic medications as already ordered, these medications have been reviewed and there are no changes at this time.  Hgb A1C to be monitored as already arranged by primary service   Current Outpatient Medications on File Prior to Visit  Medication Sig Dispense Refill  . acetaminophen  (TYLENOL ) 500 MG tablet Take 500-1,000 mg by mouth every 6 (six) hours as needed (for headache/minor pain.).    . apixaban  (ELIQUIS ) 2.5 MG TABS tablet Take Eliquis  twice daily for 2 weeks, then decrease to once daily for 4 weeks. 60 tablet 0  .  Calcium Carb-Cholecalciferol (CALCIUM-VITAMIN D3 PO) Take 1 tablet by mouth daily at 6 (six) AM.    . clobetasol  ointment (TEMOVATE ) 0.05 % Apply 1 application  topically daily as needed (psoriasis).    . cyanocobalamin (VITAMIN B12) 500 MCG  tablet Take 1,000 mcg by mouth daily.    . enalapril  (VASOTEC ) 5 MG tablet Take 1 tablet (5 mg total) by mouth 2 (two) times daily.    . ezetimibe  (ZETIA ) 10 MG tablet Take 10 mg by mouth every morning.    SABRA FEROSUL 325 (65 Fe) MG tablet Take 325 mg by mouth every morning.    . imipramine  (TOFRANIL ) 25 MG tablet Take 25 mg by mouth at bedtime.    SABRA ipratropium-albuterol  (DUONEB) 0.5-2.5 (3) MG/3ML SOLN Take 3 mLs by nebulization 2 (two) times daily.    . melatonin 5 MG TABS Take 5 mg by mouth at bedtime as needed.    . metFORMIN  (GLUCOPHAGE ) 500 MG tablet Take 1 tablet by mouth 2 (two) times daily.    . metoprolol  succinate (TOPROL -XL) 50 MG 24 hr tablet Take 1.5 tablets by mouth every morning.    . Misc Natural Products (TART CHERRY ADVANCED PO) Take 12 mg by mouth every other day.    . Multiple Vitamins-Minerals (PRESERVISION AREDS 2 PO) Take 1 capsule by mouth in the morning and at bedtime.    . oxyCODONE  (ROXICODONE ) 5 MG immediate release tablet Take 1-2 tablets (5-10 mg total) by mouth every 4 (four) hours as needed for moderate pain (pain score 4-6) or severe pain (pain score 7-10). 40 tablet 0  . Secukinumab (COSENTYX, 300 MG DOSE, ) Inject 300 mg into the skin every 30 (thirty) days.    . tamsulosin  (FLOMAX ) 0.4 MG CAPS capsule Take 1 capsule (0.4 mg total) by mouth 2 (two) times daily. TAKE 1 CAPSULE BY MOUTH  TWICE DAILY     No current facility-administered medications on file prior to visit.    There are no Patient Instructions on file for this visit. No follow-ups on file.   Deavion Dobbs E Yaniel Limbaugh, NP

## 2024-04-26 ENCOUNTER — Inpatient Hospital Stay: Attending: Radiation Oncology

## 2024-04-26 DIAGNOSIS — C61 Malignant neoplasm of prostate: Secondary | ICD-10-CM | POA: Insufficient documentation

## 2024-04-26 LAB — PSA: Prostatic Specific Antigen: <0.02 ng/mL (ref 0.00–4.00)

## 2024-05-02 ENCOUNTER — Telehealth (INDEPENDENT_AMBULATORY_CARE_PROVIDER_SITE_OTHER): Payer: Self-pay

## 2024-05-02 NOTE — Telephone Encounter (Signed)
 Spoke with the patient's spouse and he is scheduled with Dr. Marea for a IVC filter removal on 05/08/24 with a 11:30 am arrival time to the [facility]. Pre-procedure instructions were discussed and will be sent to Mychart and mailed.

## 2024-05-08 ENCOUNTER — Encounter: Payer: Self-pay | Admitting: Anesthesiology

## 2024-05-08 ENCOUNTER — Encounter: Payer: Self-pay | Admitting: Vascular Surgery

## 2024-05-08 ENCOUNTER — Ambulatory Visit
Admission: RE | Admit: 2024-05-08 | Discharge: 2024-05-08 | Disposition: A | Discharge status: Home / Self Care | Attending: Vascular Surgery | Admitting: Vascular Surgery

## 2024-05-08 ENCOUNTER — Other Ambulatory Visit: Payer: Self-pay

## 2024-05-08 ENCOUNTER — Encounter
Admission: RE | Disposition: A | Discharge status: Home / Self Care | Payer: Self-pay | Source: Home / Self Care | Attending: Vascular Surgery

## 2024-05-08 DIAGNOSIS — Z86718 Personal history of other venous thrombosis and embolism: Secondary | ICD-10-CM | POA: Diagnosis not present

## 2024-05-08 DIAGNOSIS — Z9889 Other specified postprocedural states: Secondary | ICD-10-CM | POA: Diagnosis not present

## 2024-05-08 DIAGNOSIS — E119 Type 2 diabetes mellitus without complications: Secondary | ICD-10-CM | POA: Diagnosis not present

## 2024-05-08 DIAGNOSIS — Z7901 Long term (current) use of anticoagulants: Secondary | ICD-10-CM | POA: Diagnosis not present

## 2024-05-08 DIAGNOSIS — Z7984 Long term (current) use of oral hypoglycemic drugs: Secondary | ICD-10-CM | POA: Diagnosis not present

## 2024-05-08 DIAGNOSIS — Z87891 Personal history of nicotine dependence: Secondary | ICD-10-CM | POA: Insufficient documentation

## 2024-05-08 DIAGNOSIS — Z4589 Encounter for adjustment and management of other implanted devices: Secondary | ICD-10-CM | POA: Insufficient documentation

## 2024-05-08 DIAGNOSIS — Z8719 Personal history of other diseases of the digestive system: Secondary | ICD-10-CM | POA: Diagnosis not present

## 2024-05-08 DIAGNOSIS — I82409 Acute embolism and thrombosis of unspecified deep veins of unspecified lower extremity: Secondary | ICD-10-CM

## 2024-05-08 DIAGNOSIS — Z96642 Presence of left artificial hip joint: Secondary | ICD-10-CM | POA: Diagnosis not present

## 2024-05-08 DIAGNOSIS — I10 Essential (primary) hypertension: Secondary | ICD-10-CM | POA: Insufficient documentation

## 2024-05-08 HISTORY — PX: IVC FILTER REMOVAL: CATH118246

## 2024-05-08 LAB — GLUCOSE, CAPILLARY: Glucose-Capillary: 120 mg/dL — ABNORMAL HIGH (ref 70–99)

## 2024-05-08 SURGERY — IVC FILTER REMOVAL
Anesthesia: Moderate Sedation

## 2024-05-08 MED ORDER — MIDAZOLAM HCL 2 MG/ML PO SYRP: 8.0000 mg | ORAL_SOLUTION | Freq: Once | ORAL | Status: DC | PRN

## 2024-05-08 MED ORDER — FENTANYL CITRATE (PF) 50 MCG/ML IJ SOSY
PREFILLED_SYRINGE | INTRAMUSCULAR | Status: AC
  Filled 2024-05-08: qty 1

## 2024-05-08 MED ORDER — METHYLPREDNISOLONE SODIUM SUCC 125 MG IJ SOLR: 125.0000 mg | Freq: Once | INTRAMUSCULAR | Status: DC | PRN

## 2024-05-08 MED ORDER — SODIUM CHLORIDE 0.9 % IV SOLN: INTRAVENOUS | Status: DC

## 2024-05-08 MED ORDER — LIDOCAINE-EPINEPHRINE (PF) 1 %-1:200000 IJ SOLN
INTRAMUSCULAR | Status: DC | PRN
Start: 2024-05-08 — End: 2024-05-08
  Administered 2024-05-08: 10 mL via INTRADERMAL

## 2024-05-08 MED ORDER — MIDAZOLAM HCL 2 MG/2ML IJ SOLN
INTRAMUSCULAR | Status: DC | PRN
  Administered 2024-05-08: 1 mg via INTRAVENOUS

## 2024-05-08 MED ORDER — FENTANYL CITRATE (PF) 100 MCG/2ML IJ SOLN
INTRAMUSCULAR | Status: DC | PRN
  Administered 2024-05-08: 50 ug via INTRAVENOUS

## 2024-05-08 MED ORDER — HYDROMORPHONE HCL 1 MG/ML IJ SOLN: 1.0000 mg | Freq: Once | INTRAMUSCULAR | Status: DC | PRN

## 2024-05-08 MED ORDER — MIDAZOLAM HCL 2 MG/2ML IJ SOLN
INTRAMUSCULAR | Status: AC
  Filled 2024-05-08: qty 2

## 2024-05-08 MED ORDER — IODIXANOL 320 MG/ML IV SOLN
INTRAVENOUS | Status: DC | PRN
  Administered 2024-05-08: 15 mL via INTRAVENOUS

## 2024-05-08 MED ORDER — DIPHENHYDRAMINE HCL 50 MG/ML IJ SOLN: 50.0000 mg | Freq: Once | INTRAMUSCULAR | Status: DC | PRN

## 2024-05-08 MED ORDER — ONDANSETRON HCL 4 MG/2ML IJ SOLN: 4.0000 mg | Freq: Four times a day (QID) | INTRAMUSCULAR | Status: DC | PRN

## 2024-05-08 MED ORDER — FAMOTIDINE 20 MG PO TABS: 40.0000 mg | ORAL_TABLET | Freq: Once | ORAL | Status: DC | PRN

## 2024-05-08 MED ORDER — CEFAZOLIN SODIUM-DEXTROSE 2-4 GM/100ML-% IV SOLN: 2.0000 g | INTRAVENOUS | Status: DC

## 2024-05-08 MED ORDER — HEPARIN (PORCINE) IN NACL 1000-0.9 UT/500ML-% IV SOLN
INTRAVENOUS | Status: DC | PRN
  Administered 2024-05-08: 500 mL

## 2024-05-08 SURGICAL SUPPLY — 5 items
COVER PROBE ULTRASOUND 5X96 (MISCELLANEOUS) IMPLANT
KIT MICROPUNCTURE VSI 5F STIFF (SHEATH) IMPLANT
KIT SNARE RETRIEVAL 3-LOOP (MISCELLANEOUS) IMPLANT
PACK ANGIOGRAPHY (CUSTOM PROCEDURE TRAY) ×1 IMPLANT
WIRE J 3MM .035X145CM (WIRE) IMPLANT

## 2024-05-08 NOTE — Op Note (Signed)
    OPERATIVE NOTE   PROCEDURE: Ultrasound guidance for vascular access to right jugular vein. Catheter placement into IVC from right jugular vein. Inferior venacavogram. Retrieval of IVC filter.  PRE-OPERATIVE DIAGNOSIS: 1. Status post IVC filter for previous DVT    POST-OPERATIVE DIAGNOSIS: Same as above  SURGEON: Selinda Gu, MD  ASSISTANT(S): None  ANESTHESIA: local with Moderate Conscious Sedation for approximately 26 minutes using 1 mg of Versed  and 50 mcg of Fentanyl   ESTIMATED BLOOD LOSS: 5 cc  CONTRAST: 15 cc  FLUORO TIME: 3.9 minutes  FINDING(S): 1.  Patent IVC  SPECIMEN(S):  Filter retrieved and disposed of  INDICATIONS:    Patient is a 83 y.o.male who presents with a previous history of IVC filter placement for DVT with previous GI bleed and major orthopedic surgery.  The patient desires removal of the filter to avoid the small lifetime risks of perforation, infection, thrombosis, migration, and stent fracture.  Risks and benefits of removal were discussed and the patient is agreeable to proceed.  The patient understands that the filter may not be able to be retrieved if the top of the filter has embedded in the caval wall.   DESCRIPTION: After obtaining full informed written consent, the patient was brought back to the operating room and placed supine upon the operating table.  After obtaining adequate sedation, the patient was prepped and draped in the standard fashion. Moderate conscious sedation was administered during the face to face encounter with the patient throughout the procedure with my supervision of the RN administering medicines and monitoring the patient's vital signs, pulse oximetry, telemetry and mental status throughout from the start of the procedure until the patient was taken to the recovery room.  The right jugular vein was visualized with ultrasound and found to be widely patent. This was accessed under direct ultrasound guidance with a Seldinger  needle and a permanent image was recorded. A J-wire was then placed. After skin nick and dilatation, the retrieval sheath was placed over the wire into the inferior vena cava. Inferior venacavogram was then performed. The IVC was found to be widely patent and the filter was in good location. I then was able to snare the hook on the top of the filter and advanced the sheath over the filter collapsing it and bringing it into the sheath in its entirety. It was then removed through the sheath in its entirety. The sheath was then removed and pressure was held on the neck. The patient tolerated the procedure well and was taken to the recovery room in stable condition.  COMPLICATIONS: None  CONDITION: Stable   Selinda Gu 05/08/2024 2:15 PM  This note was created with Dragon Medical transcription system. Any errors in dictation are purely unintentional.

## 2024-05-08 NOTE — Interval H&P Note (Signed)
 History and Physical Interval Note:  05/08/2024 11:36 AM  Brian Callahan  has presented today for surgery, with the diagnosis of IVC filter removal   DVT.  The various methods of treatment have been discussed with the patient and family. After consideration of risks, benefits and other options for treatment, the patient has consented to  Procedure(s): IVC FILTER REMOVAL (N/A) as a surgical intervention.  The patient's history has been reviewed, patient examined, no change in status, stable for surgery.  I have reviewed the patient's chart and labs.  Questions were answered to the patient's satisfaction.     [name]

## 2024-05-08 NOTE — Discharge Instructions (Addendum)
 Inferior Vena Cava Filter Removal, Care After The following information offers guidance on how to care for yourself after your procedure. Your health care provider may also give you more specific instructions. If you have problems or questions, contact your health care provider. What can I expect after the procedure? After the procedure, it is common to have: Mild pain and bruising around the area where the long, thin tube (catheter) was removed from  your neck  Tiredness (fatigue). Follow these instructions at home: Insertion site care  Follow instructions from your health care provider about how to take care of your catheter insertion site. Make sure you: Wash your hands with soap and water for at least 20 seconds before and after you change your bandage (dressing). If soap and water are not available, use hand sanitizer. Remove your dressing in 2 days Keep the dressing and the insertion site clean and dry. Remove bandage in 24hr You may shower in 24-36hr , remove bandage first  You may replace bandage with band-aid daily  Check your insertion site every day for signs of infection. Check for: More redness, swelling, or pain. Fluid or blood. Warmth. Pus or a bad smell.  Activity Avoid strenuous exercise or activities that take a lot of effort for 48 hours after the procedure or as told by your health care provider. Return to your normal activities tomorrow Please do not lift greater than 10lbs for 5 days General instructions Take over-the-counter and prescription medicines only as told by your health care provider. If you were given a sedative during the procedure, it can affect you for several hours. Do not drive or operate machinery for 24 hours. Keep all follow-up visits. This is important. Contact a health care provider if: You have any of these signs of infection: More redness, swelling, or pain around your catheter insertion site. Fluid or blood coming from your insertion  site. Warmth coming from your insertion site. Pus or a bad smell coming from your insertion site. Chills or a fever. You are dizzy. You have nausea or vomiting. You develop a rash. Get help right away if: You have blood coming from your catheter insertion site (active bleeding). If you have bleeding from the insertion site, lie down, apply pressure to the area with a clean cloth or gauze, and get help right away. You have chest pain, a cough, or difficulty breathing. You have shortness of breath, feel faint, or pass out. You cough up blood. You have severe pain in your abdomen. You develop swelling and discoloration or pain in your legs. Your legs become pale and cold or blue. You have weakness, difficulty moving your arms or legs, or balance problems. You develop problems with speech or vision. These symptoms may be an emergency. Get help right away. Call 911. Do not wait to see if the symptoms will go away. Do not drive yourself to the hospital. Summary Follow instructions from your health care provider about how to take care of your catheter insertion site. Return to your normal activities as told by your health care provider. Check your catheter insertion site every day for signs of infection. Get help right away if you have active bleeding, chest pain, or trouble breathing. This information is not intended to replace advice given to you by your health care provider. Make sure you discuss any questions you have with your health care provider. Document Revised: 07/29/2021 Document Reviewed: 07/29/2021 Elsevier Patient Education  2023 [employer].

## 2024-05-09 ENCOUNTER — Encounter: Payer: Self-pay | Admitting: Vascular Surgery

## 2024-05-11 ENCOUNTER — Other Ambulatory Visit

## 2024-05-18 ENCOUNTER — Ambulatory Visit
Admission: RE | Admit: 2024-05-18 | Discharge: 2024-05-18 | Disposition: A | Discharge status: Home / Self Care | Source: Ambulatory Visit | Attending: Radiation Oncology | Admitting: Radiation Oncology

## 2024-05-18 ENCOUNTER — Encounter: Payer: Self-pay | Admitting: Radiation Oncology

## 2024-05-18 VITALS — BP 134/77 | HR 96 | Temp 97.6°F | Resp 16 | Wt 193.4 lb

## 2024-05-18 DIAGNOSIS — Z923 Personal history of irradiation: Secondary | ICD-10-CM | POA: Diagnosis not present

## 2024-05-18 DIAGNOSIS — C61 Malignant neoplasm of prostate: Secondary | ICD-10-CM | POA: Diagnosis present

## 2024-05-18 DIAGNOSIS — Z86718 Personal history of other venous thrombosis and embolism: Secondary | ICD-10-CM | POA: Insufficient documentation

## 2024-05-18 NOTE — Progress Notes (Signed)
 Radiation Oncology Follow up Note  Name: Brian Callahan   Date:   05/18/2024 MRN:  [id number] DOB: [birth year]    This 83 y.o. male presents to the clinic today for 36-month follow-up status post image guided IMRT radiation therapy to his prostate for Gleason 7 adenocarcinoma.  REFERRING PROVIDER: Auston Reyes BIRCH, MD  HPI: Patient is a an 83 year old male now out 16 months having completed image guided IMRT radiation therapy for Gleason 7 adenocarcinoma the prostate.  Seen today in routine follow-up he is doing fairly well from a urologic standpoint specifically denies any increased lower urinary tract symptoms.SABRA  He is still taking Flomax  twice a day.  His most recent PSA is stable at less than 0.02.  He has recently had a IVC pill filter for previous DVT.  COMPLICATIONS OF TREATMENT: none  FOLLOW UP COMPLIANCE: keeps appointments   PHYSICAL EXAM:  BP 134/77   Pulse 96   Temp 97.6 F (36.4 C)   Resp 16   Wt 193 lb 6.4 oz (87.7 kg)   BMI 30.29 kg/m  Frail elderly male wheelchair-bound in NAD.  Well-developed well-nourished patient in NAD. HEENT reveals PERLA, EOMI, discs not visualized.  Oral cavity is clear. No oral mucosal lesions are identified. Neck is clear without evidence of cervical or supraclavicular adenopathy. Lungs are clear to A&P. Cardiac examination is essentially unremarkable with regular rate and rhythm without murmur rub or thrill. Abdomen is benign with no organomegaly or masses noted. Motor sensory and DTR levels are equal and symmetric in the upper and lower extremities. Cranial nerves II through XII are grossly intact. Proprioception is intact. No peripheral adenopathy or edema is identified. No motor or sensory levels are noted. Crude visual fields are within normal range.  RADIOLOGY RESULTS: No current films for review  PLAN: At the present time patient is under excellent biochemical control of his prostate cancer.  I am asked to see him back in 1 year for  follow-up with repeat PSA.  Patient knows to call at anytime with any concerns.  I would like to take this opportunity to thank you for allowing me to participate in the care of your patient.SABRA Marcey Penton, MD

## 2024-05-27 ENCOUNTER — Other Ambulatory Visit: Payer: Self-pay | Admitting: Urology

## 2024-05-27 DIAGNOSIS — N401 Enlarged prostate with lower urinary tract symptoms: Secondary | ICD-10-CM

## 2024-06-19 ENCOUNTER — Ambulatory Visit (INDEPENDENT_AMBULATORY_CARE_PROVIDER_SITE_OTHER): Admitting: Nurse Practitioner

## 2024-06-21 NOTE — Progress Notes (Signed)
 Large Joint Injection: L knee  Date/Time: 06/21/2024 2:45 PM  Performed by: Kip Lynwood Double, PA Authorized by: Kip Lynwood Double, [place]   Location:  Knee Site:  L knee Topical skin anesthesia: obtained using ethyl chloride spray   Medications:  8 mL BUPivacaine  HCl 0.5 %; 80 mg triamcinolone  acetonide 40 mg/mL

## 2024-06-21 NOTE — Progress Notes (Signed)
 HPI:  Brian Callahan is an 83 y.o. male who presents for follow-up now 3 months status post a left total hip arthroplasty.  Overall, the patient feels that he is doing well.  He denies any pain in the hip on today's visit, but does note occasional discomfort in his hip, especially when getting out of the car.  He is trying to do exercises on his own at home.  He will take an occasional Tylenol  as necessary with temporary partial relief of his symptoms.  The patient is still using a cane for assistance with ambulation at this time.  The pain that he feels when getting in and out of the car is along the lateral aspect of the hip, he denies any groin pain.  He is sleeping reasonably well at night.  He denies any reinjury to the hip, and denies any fevers or chills.  He is reporting increased pain in the left knee with prolonged periods of ambulation.  He does have a history of left knee osteoarthritic changes noted on x-ray.  Current Outpatient Medications  Medication Sig Dispense Refill  . acetaminophen  (TYLENOL ) 500 mg capsule Take 1,000 mg by mouth every 6 (six) hours as needed for Pain    . ANTIOX #8/OM3/DHA/EPA/LUT/ZEAX (PRESERVISION AREDS 2, OMEGA-3, ORAL) Take 1 tablet by mouth once daily    . clobetasoL  (TEMOVATE ) 0.05 % cream     . COSENTYX UNOREADY PEN 300 mg/2 mL PnIj Inject 300 mg as directed    . cyanocobalamin (VITAMIN B12) 1000 MCG tablet Take 1 tablet (1,000 mcg total) by mouth once daily 90 tablet 3  . enalapril  (VASOTEC ) 5 MG tablet TAKE 1 TABLET BY MOUTH TWICE  DAILY 200 tablet 2  . ezetimibe  (ZETIA ) 10 mg tablet TAKE 1 TABLET BY MOUTH ONCE  DAILY 100 tablet 2  . imipramine  (TOFRANIL ) 50 MG tablet Take 1 tablet (50 mg total) by mouth at bedtime 90 tablet 3  . ipratropium-albuteroL  (DUO-NEB) nebulizer solution Take 3 mLs by nebulization 2 (two) times daily for 360 days 360 mL 11  . melatonin 5 mg Cap Take 1 capsule by mouth at bedtime as needed    . metFORMIN  (GLUCOPHAGE ) 500 MG tablet  TAKE 1 TABLET BY MOUTH TWICE  DAILY WITH MEALS 200 tablet 2  . metoprolol  SUCCinate (TOPROL -XL) 50 MG XL tablet TAKE 1 AND 1/2 TABLETS BY MOUTH  ONCE DAILY 150 tablet 2  . mirtazapine (REMERON) 7.5 MG tablet Take 1 tablet (7.5 mg total) by mouth at bedtime 30 tablet 2  . oxyCODONE  (ROXICODONE ) 5 MG immediate release tablet Take 1 tablet (5 mg total) by mouth every 6 (six) hours as needed 30 tablet 0  . tamsulosin  (FLOMAX ) 0.4 mg capsule Take 0.4 mg by mouth 2 (two) times daily Take 30 minutes after same meal each day.    . tildrakizumab-asmn (ILUMYA) 100 mg/mL Syrg Inject subcutaneously    . VIT C/CHERRY & CELERY EX/GRP E (TART CHERRY ORAL) Take by mouth.     No current facility-administered medications for this visit.   Allergies  Allergen Reactions  . Imdur [Isosorbide Mononitrate] Headache  . Shellfish Containing Products Other (See Comments)    gout  . Trazodone Hcl Diarrhea   Past Medical History:  Diagnosis Date  . Abdominal pain, epigastric 08/06/2016  . Anemia    R/t GIB  . 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 Esophagitis, reflux 11/08/2017  . Eye problems   . Gastroesophageal reflux disease without esophagitis 09/05/2015  .  GI bleed 04/15/2012  . H/O gastric ulcer 09/05/2015  . H/O [facility] spotted fever    hospitalization  . Hard of hearing   . History of gastritis 09/05/2015  . Hyperglycemia   . Hyperlipidemia   . Hypertension   . Psoriasis    Per dermatogy. On Humira.   Past Surgical History:  Procedure Laterality Date  . Colectomy, partial 1998. Dr. cerame. Due to obstruction. Non-cancerous  11/15/1996  . Bursitis of his knee  2009  . GI bleed   2013  . EGD  12/05/2013   No repeat per RTE  . EGD  10/14/2016   Gastritis: No repeat per RTE  . CHOLECYSTECTOMY  10/18/2017   Dr Lucas Catchings  . COLONOSCOPY  09/27/2019   Diverticulosis/PHx CP/No Repeat due to age/TKT  . EGD  09/27/2019   Gastritis/No Yeast found/No Repeat/TKT  . Flex Sig @ [facility]   09/15/2023   Tubular adenomas/No repeat/TKT  . BIOPSY PROSTATE NEEDLE/PUNCH     [facility], 2 years ago. No cancer  . BPH    . COLONOSCOPY  12/05/2013, 07/11/2012, 10/31/1996   Dr. FABIENE Holmes @ [facility] -  Adenomatous Polyp: CBF 11/2018: Recall ltr mailed   . HERNIA REPAIR     x 2 in the mid 90's  . neck/spine surgery     2007  . SURGERY SCROTAL / TESTICULAR     surgery for fibrous growth    Family History  Problem Relation Name Age of Onset  . Emphysema Mother    . Stroke Father    . No Known Problems Sister    . Emphysema Brother    . Kidney disease Brother    . Diabetes Brother    . Heart disease Brother    . High blood pressure (Hypertension) Brother    . Diabetes Brother    . Anemia Brother    . No Known Problems Daughter    . No Known Problems Daughter    . No Known Problems Daughter    . High blood pressure (Hypertension) Daughter    . Bipolar disorder Son      Social History   Socioeconomic History  . Marital status: Married  Tobacco Use  . Smoking status: Former    Current packs/day: 0.50    Average packs/day: 0.5 packs/day for 15.0 years (7.5 ttl pk-yrs)    Types: Cigarettes  . Smokeless tobacco: Never  . Tobacco comments:    quit 25 yrs ago  Vaping Use  . Vaping status: Never Used  Substance and Sexual Activity  . Alcohol use: Yes    Alcohol/week: 0.0 standard drinks of alcohol  . Drug use: No  . Sexual activity: Defer    Partners: Female  Social History Narrative   Psurg hx   Neck/spine surgery 2007   Colectomy, partial 1998. Dr. cerame. Due to obstruction. Non-cancerous   Prostate biopsy, [facility], 2 years ago. 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 CAGE negative.   Social Drivers of Corporate Investment Banker Strain: Low Risk  (06/21/2024)   Overall Financial Resource Strain  (CARDIA)   . Difficulty of Paying Living Expenses: Not very hard  Food Insecurity: No Food Insecurity (06/21/2024)   Hunger Vital Sign   . Worried About Programme Researcher, Broadcasting/film/video in the Last Year: Never true   .  Ran Out of Food in the Last Year: Never true  Transportation Needs: No Transportation Needs (06/21/2024)   PRAPARE - Transportation   . Lack of Transportation (Medical): No   . Lack of Transportation (Non-Medical): No    Review of Systems:  A comprehensive 14 point ROS was performed, reviewed, and the pertinent orthopaedic findings are documented in the HPI.  Physical Exam: BP (!) 140/70   Ht 170.2 cm (5' 7)   Wt 87.7 kg (193 lb 6.4 oz)   BMI 30.29 kg/m  General/Constitutional: The patient appears to be well-nourished, well-developed, and in no acute distress. Neuro/Psych: Normal mood and affect, oriented to person, place and time. Eyes: Non-icteric.  Pupils are equal, round, and reactive to light, and exhibit synchronous movement. ENT: Unremarkable. Lymphatic: No palpable adenopathy. Respiratory: Non-labored breathing Cardiovascular: No edema, swelling or tenderness, except as noted in detailed exam. Integumentary: No impressive skin lesions present, except as noted in detailed exam. Musculoskeletal: Unremarkable, except as noted in detailed exam.  Left leg exam: The patient ambulates with a mild-moderate limp, favoring his left leg, and uses a cane for balance and support.  Skin inspection of the left hip demonstrates his surgical incision to be well-healed and without evidence for infection.  No swelling, erythema, ecchymosis, abrasions, or other skin abnormalities are identified.  He has mild tenderness to palpation over the lateral aspect of the left hip.  He is able to arise from a seated position with mild discomfort.  In stance, his pelvis is level.  He can heel raise and toe raise without difficulty, and is able to march in place without evidence of hip abductor weakness.   His hip moves smoothly and without pain or catching.  He is grossly neurovascularly intact to the left lower extremity and foot, although there is some swelling diffusely down his leg to his foot.  He does exhibit some chronic venous insufficiency changes to his left lower leg and ankle.  The patient does have full extension of the left knee and is able to flex 95 degrees.  Tenderness to palpation along the medial and lateral joint line of the left knee with mild underlying crepitus.  X-rays/MRI/Lab data:  None. Assessment: Encounter Diagnoses  Name Primary?  . Status post total hip replacement, left Yes  . Primary osteoarthritis of left hip     Plan: 1.  Treatment options were discussed today with the patient. 2.  Pertaining to his left hip, he was instructed to continue to perform activities as tolerated.  No restrictions to the left hip at this time. 3.  Order for physical therapy to continue to work on.  Gluteal strengthening was provided. 4.  The patient was offered and received a left knee steroid injection at today's appointment. 5.  He was instructed to follow-up in 6 weeks for repeat evaluation of his gait. 6.  He can contact the clinic if he has any questions, new symptoms develop or symptoms worsen.  Knee Injection  The risks, benefits, and alternatives of the proposed injection were discussed with patient.  The patient states that he understands these and he verbally consents to proceed.  A surgical time out was performed.  The patient's left anterolateral knee was cleaned with an alcohol prep.  Topical skin anesthesia was obtained using ethyl chloride spray.  Using aseptic technique, the knee was injected with a solution of 2 cc of Kenalog -40 (80 mg) and 8 cc of 0.5% Marcaine .  The patient tolerated the procedure well and without  any complications.  The patient was counseled as to the expected post-injection course, including the possibility of temporary worsening of symptoms.  He  also was counseled as to concerning symptoms or signs, and was instructed to contact our office if any of these should develop.  DOROTHA Gustavo Level, PA-C, CAQ-OS [facility]

## 2024-06-27 ENCOUNTER — Ambulatory Visit

## 2024-06-27 DIAGNOSIS — M25652 Stiffness of left hip, not elsewhere classified: Secondary | ICD-10-CM | POA: Diagnosis present

## 2024-06-27 DIAGNOSIS — R262 Difficulty in walking, not elsewhere classified: Secondary | ICD-10-CM | POA: Insufficient documentation

## 2024-06-27 DIAGNOSIS — M25552 Pain in left hip: Secondary | ICD-10-CM | POA: Diagnosis present

## 2024-06-27 DIAGNOSIS — R278 Other lack of coordination: Secondary | ICD-10-CM | POA: Insufficient documentation

## 2024-06-27 DIAGNOSIS — R2681 Unsteadiness on feet: Secondary | ICD-10-CM | POA: Diagnosis present

## 2024-06-27 DIAGNOSIS — M6281 Muscle weakness (generalized): Secondary | ICD-10-CM | POA: Insufficient documentation

## 2024-06-27 NOTE — Therapy (Signed)
 OUTPATIENT PHYSICAL THERAPY EVALUATION  Patient Name: Brian Callahan MRN: [id number] DOB:[birth year], 83 y.o., male Today's Date: 06/27/2024  END OF SESSION:  PT End of Session - 06/27/24 1451     Visit Number 1    Number of Visits 24    Date for Recertification  09/19/24    Authorization Type UHC Medicare    Authorization Time Period 06/27/24-09/19/2024    Progress Note Due on Visit 10    PT Start Time 1445    PT Stop Time 1525    PT Time Calculation (min) 40 min    Equipment Utilized During Treatment Gait belt    Activity Tolerance Patient tolerated treatment well;No increased pain    Behavior During Therapy [facility] for tasks assessed/performed          Past Medical History:  Diagnosis Date   Anemia    Aortic atherosclerosis    Aortic insufficiency    a. 06/2017 Echo: EF 50% with mild LVH, nl RV function, moderate AI, and mild MR/TR/PR.   Aortic root dilatation    Benign fibroma of prostate    BPH with obstruction/lower urinary tract symptoms    Cerebellar infarct (HCC)    Cerebral microvascular disease    Chest pain    a. 2010 Cath ([facility]): reportedly nl; b. 06/2017 [facility]): EF 63%, mild inf wall ischemia->med rx.   Chronic dyspnea    Chronic GI bleeding    Colon polyps    COPD (chronic obstructive pulmonary disease) (HCC)    Depression    Diaphragm paralysis (RIGHT)    Diastolic dysfunction    Diverticulosis    DM (diabetes mellitus), type 2 (HCC)    Elevated PSA    Embolus of mesenteric artery    Gastritis    GERD (gastroesophageal reflux disease)    Gout    H/O hernia repair mid-90s   x2   History of prostate cancer s/p IMRT    History of Rocky Mountain spotted fever    HLD (hyperlipidemia)    HTN (hypertension)    Incomplete bladder emptying    Insomnia    a.) takes melatonin PRN   Long term current use of immunosuppressive drug    a.) IL-17A for psoriatic arthritis (secukinumab)   Macular degeneration    Obesity    Osteoarthritis of hip     Psoriasis    Psoriatic arthritis (HCC)    a.) on IL-17A inhibitor (secukinumab)   Pulmonary hypertension (HCC)    S/P insertion of IVC (inferior vena caval) filter    SBO (small bowel obstruction); s/p partial colectomy 1998   Status post bilateral cataract extraction    Urinary frequency    Past Surgical History:  Procedure Laterality Date   APPENDECTOMY     BACK SURGERY     neck and spine 2007   BIOPSY  09/15/2023   Procedure: BIOPSY;  Surgeon: Aundria, Ladell POUR, MD;  Location: [facility] ENDOSCOPY;  Service: Gastroenterology;;   CATARACT EXTRACTION, BILATERAL Bilateral    CERVICAL SPINE SURGERY  2007   CHOLECYSTECTOMY N/A 10/18/2017   Procedure: LAPAROSCOPIC CHOLECYSTECTOMY;  Surgeon: Rodolph Romano, MD;  Location: [facility] ORS;  Service: General;  Laterality: N/A;   COLONOSCOPY     lost 10 units of blood   COLONOSCOPY WITH PROPOFOL  N/A 09/27/2019   Procedure: COLONOSCOPY WITH PROPOFOL ;  Surgeon: [name], Ladell POUR, MD;  Location: [facility] ENDOSCOPY;  Service: Gastroenterology;  Laterality: N/A;   CYSTOSCOPY WITH INSERTION OF UROLIFT N/A 01/23/2020   Procedure: CYSTOSCOPY WITH  INSERTION OF UROLIFT;  Surgeon: Twylla Glendia BROCKS, MD;  Location: [facility] ORS;  Service: Urology;  Laterality: N/A;   ESOPHAGOGASTRODUODENOSCOPY N/A 10/14/2016   Procedure: ESOPHAGOGASTRODUODENOSCOPY (EGD);  Surgeon: Lamar ONEIDA Holmes, MD;  Location: [facility] ENDOSCOPY;  Service: Endoscopy;  Laterality: N/A;   ESOPHAGOGASTRODUODENOSCOPY (EGD) WITH PROPOFOL  N/A 09/27/2019   Procedure: ESOPHAGOGASTRODUODENOSCOPY (EGD) WITH PROPOFOL ;  Surgeon: [name], Ladell POUR, MD;  Location: [facility] ENDOSCOPY;  Service: Gastroenterology;  Laterality: N/A;   FLEXIBLE SIGMOIDOSCOPY N/A 09/15/2023   Procedure: FLEXIBLE SIGMOIDOSCOPY;  Surgeon: [name], Ladell POUR, MD;  Location: [facility] ENDOSCOPY;  Service: Gastroenterology;  Laterality: N/A;  DM   HERNIA REPAIR Bilateral mid-90s   x2  inguinal   IVC FILTER INSERTION N/A 03/06/2024   Procedure: IVC  FILTER INSERTION;  Surgeon: Marea Selinda RAMAN, MD;  Location: [facility] INVASIVE CV LAB;  Service: Cardiovascular;  Laterality: N/A;   IVC FILTER REMOVAL N/A 05/08/2024   Procedure: IVC FILTER REMOVAL;  Surgeon: Marea Selinda RAMAN, MD;  Location: [facility] INVASIVE CV LAB;  Service: Cardiovascular;  Laterality: N/A;   PARTIAL COLECTOMY  1998   POLYPECTOMY  09/15/2023   Procedure: POLYPECTOMY;  Surgeon: [name], Ladell POUR, MD;  Location: [facility] ENDOSCOPY;  Service: Gastroenterology;;   PROSTATE BIOPSY  2013   SURGERY SCROTAL / TESTICULAR     for fibrous growth   TOTAL HIP ARTHROPLASTY Left 03/14/2024   Procedure: ARTHROPLASTY, HIP, TOTAL,POSTERIOR APPROACH;  Surgeon: Edie Norleen PARAS, MD;  Location: [facility] ORS;  Service: Orthopedics;  Laterality: Left;   Patient Active Problem List   Diagnosis Date Noted   Status post total hip replacement, left 03/14/2024   Diastolic dysfunction    Diabetes mellitus type 2 with complications (HCC) 03/23/2018   Chest pain with moderate risk for cardiac etiology 11/29/2017   Positive cardiac stress test 11/29/2017   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 11/08/2017   Esophagitis, reflux 11/08/2017   SOB (shortness of breath) on exertion 07/02/2017   BPH with obstruction/lower urinary tract symptoms 11/21/2015   History of elevated PSA 11/21/2015   H/O gastric ulcer 09/05/2015   Disorder of eyeball 05/23/2015   H/O infectious disease 05/23/2015   Blood glucose elevated 05/23/2015   BP (high blood pressure) 05/23/2015   HLD (hyperlipidemia) 05/23/2015   Psoriasis 05/23/2015   Absolute anemia 01/02/2014   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   Elevated prostate specific antigen (PSA) 01/02/2014   Elevated blood uric acid level 01/02/2014   Enlarged prostate without lower urinary tract symptoms (luts) 01/02/2014   Bleeding gastrointestinal 04/15/2012    PCP: Auston Reyes BIRCH, MD  REFERRING PROVIDER: Kip Lynwood Double, PA*  REFERRING DIAG: s/p Left THA   THERAPY DIAG:   Stiffness of left hip, not elsewhere classified  Pain in left hip  Difficulty in walking, not elsewhere classified  Rationale for Evaluation and Treatment: rehabilitation   ONSET DATE: August 2025   SUBJECTIVE:   SUBJECTIVE STATEMENT: Pt still needs help wth rehab following THA in August.   PERTINENT HISTORY: 83yoM referred to OPPT by orthopedics for ongoing difficulty walking, leg weakness, imbalance since Left THA in August 2025. Pt had a remarkable difficulty with LLE swelling since surgery. Pt saw HHPT 3x/week for 2 weeks. Pt has transitioned to [facility] for AMB outside of house or walker in house if he needs to  PAIN:  Are you having pain? None at rest, can be up to 5/10 during car transfers    PRECAUTIONS: Posterior hip precautions   WEIGHT BEARING RESTRICTIONS: Yes, WBAT  FALLS:  Has  patient fallen in last 6 months? No   LIVING ENVIRONMENT: Lives with: Wife  Lives in: House  Stairs: 4, 1 railing  Has following equipment at home: SPC, BSC, RW   OCCUPATION: retired   PATIENT GOALS: wants to be able to walk the dogs again without DME; wants to have less pain with car transfers.   OBJECTIVE:  Note: Objective measures were completed at Evaluation unless otherwise noted.  PATIENT SURVEYS:  LEFS: 47.5%  LOWER EXTREMITY MMT:  MMT Right eval Left eval  Hip flexion 5/5 5/5  Hip extension N/t N/t  Hip hor abduction 5/5 4+/5  Hip hor adduction 5/5 5/5  Hip internal rotation 5/5 5/5  Hip external rotation 5/5 4+/5*   (Blank rows = not tested)  LOWER EXTREMITY SPECIAL TESTS:    FUNCTIONAL TESTS:  5x STS: 11.67sec, hands free, no pain   : 15.91sec SPC, RW: 13.15sec   : 410ft, 35m48s, pain over final 17ft left hip  GAIT: Distance walked: 4109ft Assistive device utilized: Environmental Consultant - 2 wheeled Level of assistance: SBA Comments: pain in left hip in final 72ft  *abducted LLE, moresof with SPC use, 50% improved with RW                                                                                                                               TREATMENT DATE 06/27/24 :  -STS from chair x5 -AMB 2x 80ft with SPC, then 2x65ft with RW, cues on gait correction -long distance AMB with RW: 442ft, limited by pain -MMT BLE: pain with resisted Left hip ER only: other areas of weakness.   PATIENT EDUCATION:  Education details: Kydan Person educated: update minisquats at home to full STS squats  Education method: collaborative learning, deliberate practice, positive reinforcement, explicit instruction, establish rules. Education comprehension: fair  HOME EXERCISE PROGRAM: Will formally update on visit 2: currently performing the following as a holdover from HHPT: Heel to toe, mini squat , LAQ, standing hip ABDCT   ASSESSMENT:  CLINICAL IMPRESSION: 83yoM referred to OPPT 3 months post THA, still having pain with deep rang ehip extension left, also dependent on DME for AMB, and limited in AMB tolerance. Exam revealing of generally good strength, with carryover antalgia of gait, limited walking tolerance, and pain after 400ft AMB. Pt has not been able to resume baseline household activity and dog walking. Patient will benefit from skilled physical therapy intervention to reduce deficits and impairments identified in evaluation, in order to reduce pain, improve quality of life, and maximize activity tolerance for ADL, IADL, and leisure/fitness. Physical therapy will help pt achieve long and short term goals of care.    OBJECTIVE IMPAIRMENTS: Decreased knowledge of condition, decreased use of DME, decreased mobility, difficulty walking, decreased strength, decreased ROM. ACTIVITY LIMITATIONS: Lifting, standing, walking, squatting, transfers, locomotion level PARTICIPATION LIMITATIONS: Cleaning, laundry, interpersonal relationships, driving, yardwork, community activity.  PERSONAL FACTORS: Age, behavior pattern, education, past/current experiences, transportation,  profession  are also affecting patient's functional outcome.  REHAB POTENTIAL: Good CLINICAL DECISION MAKING: Medium  EVALUATION COMPLEXITY: Moderate    GOALS: Goals reviewed with patient? No  SHORT TERM GOALS: Target date: 07/28/24 MMT in BLE hips 5/5 pain free  Baseline: *see eval for detail, Left hip ER painful/weak  Goal status: INITIAL  2.  >985ft c RW to improve limited community distance moblity for independent exercise.  Baseline: 455ft c RW  Goal status: INITIAL  3.  Pt to demonstrate narrow stance on foam balance >30sec, eyes open to show improved Left hip proprioception/motor control.  Baseline: defer to visit 7  Goal status: INITIAL   LONG TERM GOALS: Target date: 08/28/24  Knowledge and confidence in a final DC HEP for conitnued improvements in strength and balance.  Baseline:  Goal status: INITIAL  2.  LEFS survey improvement >20% to indicated reduced disability.  Baseline: 47.5% Goal status: INITIAL  3.  c SPC >1.56m/s  Baseline: 0.27m/s c SPC Goal status: INITIAL  4.  >1522ft with DME PRN to improve energy conservation for community AMB.  Baseline: 444ft c RW  Goal status: INITIAL   PLAN:  PT FREQUENCY: 1-2x/week  PT DURATION: 12 weeks  PLANNED INTERVENTIONS: 97110-Therapeutic exercises, 97530- Therapeutic activity, 97112- Neuromuscular re-education, 97535- Self Care, [id number]- Manual therapy, [id number]- Electrical stimulation (unattended), [phone]- Electrical stimulation (manual), Patient/Family education, Balance training, Stair training, Joint mobilization, Joint manipulation, DME instructions, Cryotherapy, and Moist heat  PLAN FOR NEXT SESSION: HEP updates and hand out  3:32 PM, 06/27/24 Peggye JAYSON Linear, PT, DPT Physical Therapist - [facility]  Outpatient Physical Therapy- Main Campus [phone]      [place] C, PT 06/27/2024, 2:55 PM

## 2024-07-03 ENCOUNTER — Ambulatory Visit

## 2024-07-03 DIAGNOSIS — M25652 Stiffness of left hip, not elsewhere classified: Secondary | ICD-10-CM

## 2024-07-03 DIAGNOSIS — R262 Difficulty in walking, not elsewhere classified: Secondary | ICD-10-CM

## 2024-07-03 DIAGNOSIS — M6281 Muscle weakness (generalized): Secondary | ICD-10-CM

## 2024-07-03 DIAGNOSIS — M25552 Pain in left hip: Secondary | ICD-10-CM

## 2024-07-03 NOTE — Therapy (Signed)
 OUTPATIENT PHYSICAL THERAPY TREATMENT  Patient Name: Brian Callahan MRN: [id number] DOB:November 23, 1940, 83 y.o., male Today's Date: 07/03/2024  END OF SESSION:  PT End of Session - 07/03/24 1322     Visit Number 2    Number of Visits 24    Date for Recertification  09/19/24    Authorization Type [employer]    Authorization Time Period 06/27/24-09/19/2024    Progress Note Due on Visit 10    PT Start Time 1315    PT Stop Time 1355    PT Time Calculation (min) 40 min    Equipment Utilized During Treatment Gait belt    Activity Tolerance Patient tolerated treatment well;No increased pain    Behavior During Therapy [facility] for tasks assessed/performed          Past Medical History:  Diagnosis Date   Anemia    Aortic atherosclerosis    Aortic insufficiency    a. 06/2017 Echo: EF 50% with mild LVH, nl RV function, moderate AI, and mild MR/TR/PR.   Aortic root dilatation    Benign fibroma of prostate    BPH with obstruction/lower urinary tract symptoms    Cerebellar infarct (HCC)    Cerebral microvascular disease    Chest pain    a. 2010 Cath ([facility]): reportedly nl; b. 06/2017 [facility]): EF 63%, mild inf wall ischemia->med rx.   Chronic dyspnea    Chronic GI bleeding    Colon polyps    COPD (chronic obstructive pulmonary disease) (HCC)    Depression    Diaphragm paralysis (RIGHT)    Diastolic dysfunction    Diverticulosis    DM (diabetes mellitus), type 2 (HCC)    Elevated PSA    Embolus of mesenteric artery    Gastritis    GERD (gastroesophageal reflux disease)    Gout    H/O hernia repair mid-90s   x2   History of prostate cancer s/p IMRT    History of [place] spotted fever    HLD (hyperlipidemia)    HTN (hypertension)    Incomplete bladder emptying    Insomnia    a.) takes melatonin PRN   Long term current use of immunosuppressive drug    a.) IL-17A for psoriatic arthritis (secukinumab)   Macular degeneration    Obesity    Osteoarthritis of hip     Psoriasis    Psoriatic arthritis (HCC)    a.) on IL-17A inhibitor (secukinumab)   Pulmonary hypertension (HCC)    S/P insertion of IVC (inferior vena caval) filter    SBO (small bowel obstruction); s/p partial colectomy 1998   Status post bilateral cataract extraction    Urinary frequency    Past Surgical History:  Procedure Laterality Date   APPENDECTOMY     BACK SURGERY     neck and spine 2007   BIOPSY  09/15/2023   Procedure: BIOPSY;  Surgeon: Aundria, Ladell POUR, MD;  Location: [facility] ENDOSCOPY;  Service: Gastroenterology;;   CATARACT EXTRACTION, BILATERAL Bilateral    CERVICAL SPINE SURGERY  2007   CHOLECYSTECTOMY N/A 10/18/2017   Procedure: LAPAROSCOPIC CHOLECYSTECTOMY;  Surgeon: Rodolph Romano, MD;  Location: [facility] ORS;  Service: General;  Laterality: N/A;   COLONOSCOPY     lost 10 units of blood   COLONOSCOPY WITH PROPOFOL  N/A 09/27/2019   Procedure: COLONOSCOPY WITH PROPOFOL ;  Surgeon: [name], Ladell POUR, MD;  Location: [facility] ENDOSCOPY;  Service: Gastroenterology;  Laterality: N/A;   CYSTOSCOPY WITH INSERTION OF UROLIFT N/A 01/23/2020   Procedure: CYSTOSCOPY WITH  INSERTION OF UROLIFT;  Surgeon: Twylla Glendia BROCKS, MD;  Location: [facility] ORS;  Service: Urology;  Laterality: N/A;   ESOPHAGOGASTRODUODENOSCOPY N/A 10/14/2016   Procedure: ESOPHAGOGASTRODUODENOSCOPY (EGD);  Surgeon: Lamar ONEIDA Holmes, MD;  Location: [facility] ENDOSCOPY;  Service: Endoscopy;  Laterality: N/A;   ESOPHAGOGASTRODUODENOSCOPY (EGD) WITH PROPOFOL  N/A 09/27/2019   Procedure: ESOPHAGOGASTRODUODENOSCOPY (EGD) WITH PROPOFOL ;  Surgeon: [name], Ladell POUR, MD;  Location: [facility] ENDOSCOPY;  Service: Gastroenterology;  Laterality: N/A;   FLEXIBLE SIGMOIDOSCOPY N/A 09/15/2023   Procedure: FLEXIBLE SIGMOIDOSCOPY;  Surgeon: [name], Ladell POUR, MD;  Location: [facility] ENDOSCOPY;  Service: Gastroenterology;  Laterality: N/A;  DM   HERNIA REPAIR Bilateral mid-90s   x2  inguinal   IVC FILTER INSERTION N/A 03/06/2024   Procedure: IVC  FILTER INSERTION;  Surgeon: Marea Selinda RAMAN, MD;  Location: [facility] INVASIVE CV LAB;  Service: Cardiovascular;  Laterality: N/A;   IVC FILTER REMOVAL N/A 05/08/2024   Procedure: IVC FILTER REMOVAL;  Surgeon: Marea Selinda RAMAN, MD;  Location: [facility] INVASIVE CV LAB;  Service: Cardiovascular;  Laterality: N/A;   PARTIAL COLECTOMY  1998   POLYPECTOMY  09/15/2023   Procedure: POLYPECTOMY;  Surgeon: [name], Ladell POUR, MD;  Location: [facility] ENDOSCOPY;  Service: Gastroenterology;;   PROSTATE BIOPSY  2013   SURGERY SCROTAL / TESTICULAR     for fibrous growth   TOTAL HIP ARTHROPLASTY Left 03/14/2024   Procedure: ARTHROPLASTY, HIP, TOTAL,POSTERIOR APPROACH;  Surgeon: Edie Norleen PARAS, MD;  Location: [facility] ORS;  Service: Orthopedics;  Laterality: Left;   Patient Active Problem List   Diagnosis Date Noted   Status post total hip replacement, left 03/14/2024   Diastolic dysfunction    Diabetes mellitus type 2 with complications (HCC) 03/23/2018   Chest pain with moderate risk for cardiac etiology 11/29/2017   Positive cardiac stress test 11/29/2017   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 11/08/2017   Esophagitis, reflux 11/08/2017   SOB (shortness of breath) on exertion 07/02/2017   BPH with obstruction/lower urinary tract symptoms 11/21/2015   History of elevated PSA 11/21/2015   H/O gastric ulcer 09/05/2015   Disorder of eyeball 05/23/2015   H/O infectious disease 05/23/2015   Blood glucose elevated 05/23/2015   BP (high blood pressure) 05/23/2015   HLD (hyperlipidemia) 05/23/2015   Psoriasis 05/23/2015   Absolute anemia 01/02/2014   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   Elevated prostate specific antigen (PSA) 01/02/2014   Elevated blood uric acid level 01/02/2014   Enlarged prostate without lower urinary tract symptoms (luts) 01/02/2014   Bleeding gastrointestinal 04/15/2012    PCP: Auston Reyes BIRCH, MD  REFERRING PROVIDER: Kip Lynwood Double, PA*  REFERRING DIAG: s/p Left THA   THERAPY DIAG:   Stiffness of left hip, not elsewhere classified  Pain in left hip  Difficulty in walking, not elsewhere classified  Muscle weakness (generalized)  Rationale for Evaluation and Treatment: rehabilitation   ONSET DATE: August 2025   SUBJECTIVE:   SUBJECTIVE STATEMENT: Pt reports doing well today. His left knee was a little sore today.   PERTINENT HISTORY: 83yoM referred to OPPT by orthopedics for ongoing difficulty walking, leg weakness, imbalance since Left THA in August 2025. Pt had a remarkable difficulty with LLE swelling since surgery. Pt saw HHPT 3x/week for 2 weeks. Pt has transitioned to [facility] for AMB outside of house or walker in house if he needs to  PAIN:  Are you having pain? None at rest, can be up to 5/10 during car transfers    PRECAUTIONS: Posterior hip precautions   WEIGHT BEARING  RESTRICTIONS: Yes, WBAT  FALLS:  Has patient fallen in last 6 months? No   LIVING ENVIRONMENT: Lives with: Wife  Lives in: House  Stairs: 4, 1 railing  Has following equipment at home: SPC, BSC, RW   OCCUPATION: retired   PATIENT GOALS: wants to be able to walk the dogs again without DME; wants to have less pain with car transfers.   OBJECTIVE:  Note: Objective measures were completed at Evaluation unless otherwise noted.  PATIENT SURVEYS:  LEFS: 47.5%  LOWER EXTREMITY MMT:  MMT Right eval Left eval  Hip flexion 5/5 5/5  Hip extension N/t N/t  Hip hor abduction 5/5 4+/5  Hip hor adduction 5/5 5/5  Hip internal rotation 5/5 5/5  Hip external rotation 5/5 4+/5*   (Blank rows = not tested)  LOWER EXTREMITY SPECIAL TESTS:    FUNCTIONAL TESTS:  5x STS: 11.67sec, hands free, no pain   : 15.91sec SPC, RW: 13.15sec   : 482ft, 13m48s, pain over final 43ft left hip   GAIT: Distance walked: 430ft Assistive device utilized: [employer] - 2 wheeled Level of assistance: SBA Comments: pain in left hip in final 60ft  *abducted LLE, moresof with SPC use, 50% improved  with RW                                                                                                                              TREATMENT DATE 07/03/24 :  -AA/ROM on Nustep, seat 9, arms 9: level 2x3 minutes, then level 3x1 minutes  -STS from elevation hands free 1x10  -standing hip flexion 1x12 -seated RTB clam 1x15  -green ball squeeze 15x3secH   -STS from elevation hands free 1x10  -standing hip flexion 1x12 -seated RTB clam 1x15  -green ball squeeze 15x3secH  -hooklying bridge 1x10 (low excursion, but no pain if knees are bent)  -hooklying marching 1x10 bilat  -hooklying bridge 1x10  -hooklying marching 1x10 bilat   -Side stepping at support bar 1x30 (no resistance yet)  *muscles look very limited in capacity for pelvis stabilization *sit break, reviewed HEP handout -Side stepping at support bar 1x30  -narrow stance balance hands free x2 minutes -wide tandem stance balance 1x30sec bilat   PATIENT EDUCATION:  Education details: Matt Person educated: update minisquats at home to full STS squats  Education method: collaborative learning, deliberate practice, positive reinforcement, explicit instruction, establish rules. Education comprehension: fair  HOME EXERCISE PROGRAM: Access Code: [email or web address] URL: [email or web address] Date: 07/03/2024 Prepared by: Peggye Linear  Exercises - Sit to Stand form HIGH surface  - 2 x daily - 1 sets - 10 reps - Marching at Counter with Minimal Hand Support   - 2 x daily - 1 sets - 30 reps - Side Stepping with Counter Support  - 2 x daily - 1 sets - 20 reps - Supine Bridge  - 2 x daily - 1 sets - 10 reps - Supine March  - 2 x daily - 1  sets - 20 reps   ASSESSMENT:  CLINICAL IMPRESSION: Pt returns for visit 2. HEP is advanced, updated, performed, printed, and provided. Pt has no pain or frank LOB in session. Knee pain on arrival improved after warmup ROM on Nustep. Patient will benefit from skilled physical therapy  intervention to reduce deficits and impairments identified in evaluation, in order to reduce pain, improve quality of life, and maximize activity tolerance for ADL, IADL, and leisure/fitness. Physical therapy will help pt achieve long and short term goals of care.    OBJECTIVE IMPAIRMENTS: Decreased knowledge of condition, decreased use of DME, decreased mobility, difficulty walking, decreased strength, decreased ROM. ACTIVITY LIMITATIONS: Lifting, standing, walking, squatting, transfers, locomotion level PARTICIPATION LIMITATIONS: Cleaning, laundry, interpersonal relationships, driving, yardwork, community activity.  PERSONAL FACTORS: Age, behavior pattern, education, past/current experiences, transportation, profession  are also affecting patient's functional outcome.  REHAB POTENTIAL: Good CLINICAL DECISION MAKING: Medium  EVALUATION COMPLEXITY: Moderate    GOALS: Goals reviewed with patient? No  SHORT TERM GOALS: Target date: 07/28/24 MMT in BLE hips 5/5 pain free  Baseline: *see eval for detail, Left hip ER painful/weak  Goal status: INITIAL  2.  >943ft c RW to improve limited community distance moblity for independent exercise.  Baseline: 412ft c RW  Goal status: INITIAL  3.  Pt to demonstrate narrow stance on foam balance >30sec, eyes open to show improved Left hip proprioception/motor control.  Baseline: defer to visit 7  Goal status: INITIAL   LONG TERM GOALS: Target date: 08/28/24  Knowledge and confidence in a final DC HEP for conitnued improvements in strength and balance.  Baseline:  Goal status: INITIAL  2.  LEFS survey improvement >20% to indicated reduced disability.  Baseline: 47.5% Goal status: INITIAL  3.  c SPC >1.64m/s  Baseline: 0.15m/s c SPC Goal status: INITIAL  4.  >15101ft with DME PRN to improve energy conservation for community AMB.  Baseline: 421ft c RW  Goal status: INITIAL   PLAN:  PT FREQUENCY: 1-2x/week  PT DURATION: 12  weeks  PLANNED INTERVENTIONS: 97110-Therapeutic exercises, 97530- Therapeutic activity, [id number]- Neuromuscular re-education, 97535- Self Care, [id number]- Manual therapy, [id number]- Electrical stimulation (unattended), [phone]- Electrical stimulation (manual), Patient/Family education, Balance training, Stair training, Joint mobilization, Joint manipulation, DME instructions, Cryotherapy, and Moist heat  PLAN FOR NEXT SESSION:  Tolerance to recent HEP updates. Continue with general strengthening.   1:23 PM, 07/03/24 Peggye JAYSON Linear, PT, DPT Physical Therapist - [facility]  Outpatient Physical Therapy- [facility] [phone]     [place]ulton C, PT 07/03/2024, 1:23 PM

## 2024-07-05 ENCOUNTER — Ambulatory Visit

## 2024-07-05 DIAGNOSIS — R278 Other lack of coordination: Secondary | ICD-10-CM

## 2024-07-05 DIAGNOSIS — M6281 Muscle weakness (generalized): Secondary | ICD-10-CM

## 2024-07-05 DIAGNOSIS — M25652 Stiffness of left hip, not elsewhere classified: Secondary | ICD-10-CM

## 2024-07-05 DIAGNOSIS — R262 Difficulty in walking, not elsewhere classified: Secondary | ICD-10-CM

## 2024-07-05 DIAGNOSIS — R2681 Unsteadiness on feet: Secondary | ICD-10-CM

## 2024-07-05 DIAGNOSIS — M25552 Pain in left hip: Secondary | ICD-10-CM

## 2024-07-05 NOTE — Therapy (Signed)
 OUTPATIENT PHYSICAL THERAPY TREATMENT  Patient Name: Brian Callahan MRN: [id number] DOB:1940/11/17, 83 y.o., male Today's Date: 07/05/2024  END OF SESSION:  PT End of Session - 07/05/24 1443     Visit Number 3    Number of Visits 24    Date for Recertification  09/19/24    Authorization Type [employer] Medicare    Authorization Time Period 06/27/24-09/19/2024    Progress Note Due on Visit 10    PT Start Time 1444    PT Stop Time 1528    PT Time Calculation (min) 44 min    Equipment Utilized During Treatment Gait belt    Activity Tolerance Patient tolerated treatment well;No increased pain    Behavior During Therapy [facility] for tasks assessed/performed          Past Medical History:  Diagnosis Date   Anemia    Aortic atherosclerosis    Aortic insufficiency    a. 06/2017 Echo: EF 50% with mild LVH, nl RV function, moderate AI, and mild MR/TR/PR.   Aortic root dilatation    Benign fibroma of prostate    BPH with obstruction/lower urinary tract symptoms    Cerebellar infarct (HCC)    Cerebral microvascular disease    Chest pain    a. 2010 Cath ([facility]): reportedly nl; b. 06/2017 [facility]): EF 63%, mild inf wall ischemia->med rx.   Chronic dyspnea    Chronic GI bleeding    Colon polyps    COPD (chronic obstructive pulmonary disease) (HCC)    Depression    Diaphragm paralysis (RIGHT)    Diastolic dysfunction    Diverticulosis    DM (diabetes mellitus), type 2 (HCC)    Elevated PSA    Embolus of mesenteric artery    Gastritis    GERD (gastroesophageal reflux disease)    Gout    H/O hernia repair mid-90s   x2   History of prostate cancer s/p IMRT    History of [place] spotted fever    HLD (hyperlipidemia)    HTN (hypertension)    Incomplete bladder emptying    Insomnia    a.) takes melatonin PRN   Long term current use of immunosuppressive drug    a.) IL-17A for psoriatic arthritis (secukinumab)   Macular degeneration    Obesity    Osteoarthritis of hip     Psoriasis    Psoriatic arthritis (HCC)    a.) on IL-17A inhibitor (secukinumab)   Pulmonary hypertension (HCC)    S/P insertion of IVC (inferior vena caval) filter    SBO (small bowel obstruction); s/p partial colectomy 1998   Status post bilateral cataract extraction    Urinary frequency    Past Surgical History:  Procedure Laterality Date   APPENDECTOMY     BACK SURGERY     neck and spine 2007   BIOPSY  09/15/2023   Procedure: BIOPSY;  Surgeon: Aundria, Ladell POUR, MD;  Location: [facility] ENDOSCOPY;  Service: Gastroenterology;;   CATARACT EXTRACTION, BILATERAL Bilateral    CERVICAL SPINE SURGERY  2007   CHOLECYSTECTOMY N/A 10/18/2017   Procedure: LAPAROSCOPIC CHOLECYSTECTOMY;  Surgeon: Rodolph Romano, MD;  Location: [facility] ORS;  Service: General;  Laterality: N/A;   COLONOSCOPY     lost 10 units of blood   COLONOSCOPY WITH PROPOFOL  N/A 09/27/2019   Procedure: COLONOSCOPY WITH PROPOFOL ;  Surgeon: [name], Ladell POUR, MD;  Location: [facility] ENDOSCOPY;  Service: Gastroenterology;  Laterality: N/A;   CYSTOSCOPY WITH INSERTION OF UROLIFT N/A 01/23/2020   Procedure: CYSTOSCOPY WITH  INSERTION OF UROLIFT;  Surgeon: Twylla Glendia BROCKS, MD;  Location: [facility] ORS;  Service: Urology;  Laterality: N/A;   ESOPHAGOGASTRODUODENOSCOPY N/A 10/14/2016   Procedure: ESOPHAGOGASTRODUODENOSCOPY (EGD);  Surgeon: Lamar ONEIDA Holmes, MD;  Location[facility] ENDOSCOPY;  Service: Endoscopy;  Laterality: N/A;   ESOPHAGOGASTRODUODENOSCOPY (EGD) WITH PROPOFOL  N/A 09/27/2019   Procedure: ESOPHAGOGASTRODUODENOSCOPY (EGD) WITH PROPOFOL ;  Surgeon: [name], Ladell POUR, MD;  Location: [facility] ENDOSCOPY;  Service: Gastroenterology;  Laterality: N/A;   FLEXIBLE SIGMOIDOSCOPY N/A 09/15/2023   Procedure: FLEXIBLE SIGMOIDOSCOPY;  Surgeon: [name], Ladell POUR, MD;  Location: [facility] ENDOSCOPY;  Service: Gastroenterology;  Laterality: N/A;  DM   HERNIA REPAIR Bilateral mid-90s   x2  inguinal   IVC FILTER INSERTION N/A 03/06/2024   Procedure: IVC  FILTER INSERTION;  Surgeon: Marea Selinda RAMAN, MD;  Location: [facility] INVASIVE CV LAB;  Service: Cardiovascular;  Laterality: N/A;   IVC FILTER REMOVAL N/A 05/08/2024   Procedure: IVC FILTER REMOVAL;  Surgeon: Marea Selinda RAMAN, MD;  Location: [facility] INVASIVE CV LAB;  Service: Cardiovascular;  Laterality: N/A;   PARTIAL COLECTOMY  1998   POLYPECTOMY  09/15/2023   Procedure: POLYPECTOMY;  Surgeon: [name], Ladell POUR, MD;  Location: [facility] ENDOSCOPY;  Service: Gastroenterology;;   PROSTATE BIOPSY  2013   SURGERY SCROTAL / TESTICULAR     for fibrous growth   TOTAL HIP ARTHROPLASTY Left 03/14/2024   Procedure: ARTHROPLASTY, HIP, TOTAL,POSTERIOR APPROACH;  Surgeon: Edie Norleen PARAS, MD;  Location: [facility] ORS;  Service: Orthopedics;  Laterality: Left;   Patient Active Problem List   Diagnosis Date Noted   Status post total hip replacement, left 03/14/2024   Diastolic dysfunction    Diabetes mellitus type 2 with complications (HCC) 03/23/2018   Chest pain with moderate risk for cardiac etiology 11/29/2017   Positive cardiac stress test 11/29/2017   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 11/08/2017   Esophagitis, reflux 11/08/2017   SOB (shortness of breath) on exertion 07/02/2017   BPH with obstruction/lower urinary tract symptoms 11/21/2015   History of elevated PSA 11/21/2015   H/O gastric ulcer 09/05/2015   Disorder of eyeball 05/23/2015   H/O infectious disease 05/23/2015   Blood glucose elevated 05/23/2015   BP (high blood pressure) 05/23/2015   HLD (hyperlipidemia) 05/23/2015   Psoriasis 05/23/2015   Absolute anemia 01/02/2014   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   Elevated prostate specific antigen (PSA) 01/02/2014   Elevated blood uric acid level 01/02/2014   Enlarged prostate without lower urinary tract symptoms (luts) 01/02/2014   Bleeding gastrointestinal 04/15/2012    PCP: Auston Reyes BIRCH, MD  REFERRING PROVIDER: Kip Lynwood Double, PA*  REFERRING DIAG: s/p Left THA   THERAPY DIAG:   Stiffness of left hip, not elsewhere classified  Pain in left hip  Difficulty in walking, not elsewhere classified  Muscle weakness (generalized)  Unsteadiness on feet  Other lack of coordination  Rationale for Evaluation and Treatment: rehabilitation   ONSET DATE: August 2025   SUBJECTIVE:   SUBJECTIVE STATEMENT: Pt reports doing well today. Continues to have L knee soreness, but no pain in the L hip. He arrived using [facility] with gait.   PERTINENT HISTORY: 83yoM referred to OPPT by orthopedics for ongoing difficulty walking, leg weakness, imbalance since Left THA in August 2025. Pt had a remarkable difficulty with LLE swelling since surgery. Pt saw HHPT 3x/week for 2 weeks. Pt has transitioned to [facility] for AMB outside of house or walker in house if he needs to  PAIN:  Are you having pain? None at  rest, can be up to 5/10 during car transfers    PRECAUTIONS: Posterior hip precautions   WEIGHT BEARING RESTRICTIONS: Yes, WBAT  FALLS:  Has patient fallen in last 6 months? No   LIVING ENVIRONMENT: Lives with: Wife  Lives in: House  Stairs: 4, 1 railing  Has following equipment at home: SPC, BSC, RW   OCCUPATION: retired   PATIENT GOALS: wants to be able to walk the dogs again without DME; wants to have less pain with car transfers.   OBJECTIVE:  Note: Objective measures were completed at Evaluation unless otherwise noted.  PATIENT SURVEYS:  LEFS: 47.5%  LOWER EXTREMITY MMT:  MMT Right eval Left eval  Hip flexion 5/5 5/5  Hip extension N/t N/t  Hip hor abduction 5/5 4+/5  Hip hor adduction 5/5 5/5  Hip internal rotation 5/5 5/5  Hip external rotation 5/5 4+/5*   (Blank rows = not tested)  LOWER EXTREMITY SPECIAL TESTS:    FUNCTIONAL TESTS:  5x STS: 11.67sec, hands free, no pain   : 15.91sec SPC, RW: 13.15sec   : 431ft, 31m48s, pain over final 23ft left hip   GAIT: Distance walked: 46ft Assistive device utilized: Walker - 2 wheeled Level of  assistance: SBA Comments: pain in left hip in final 72ft  *abducted LLE, moresof with SPC use, 50% improved with RW                                                                                                                              TREATMENT DATE 07/05/24 :  -AA/ROM on [place] 3-6 level at 7 minutes.  -Green ball squeeze 20x5'' holds  -STS from elevation hands free 2x10  -standing hip flexion 2x10 each.  -standing hip abduction 2x10 each.  -seated RTB clam 2x15  -Side stepping in // bars x4 laps   Gait x150' with [facility] CGA for safety.     PATIENT EDUCATION:  Education details: Daren Person educated: update minisquats at home to full STS squats  Education method: collaborative learning, deliberate practice, positive reinforcement, explicit instruction, establish rules. Education comprehension: fair  HOME EXERCISE PROGRAM: Access Code: [email or web address] URL: [email or web address] Date: 07/03/2024 Prepared by: Peggye Linear  Exercises - Sit to Stand form HIGH surface  - 2 x daily - 1 sets - 10 reps - Marching at Counter with Minimal Hand Support   - 2 x daily - 1 sets - 30 reps - Side Stepping with Counter Support  - 2 x daily - 1 sets - 20 reps - Supine Bridge  - 2 x daily - 1 sets - 10 reps - Supine March  - 2 x daily - 1 sets - 20 reps   ASSESSMENT:  CLINICAL IMPRESSION: Pt began today's treatment with NuStep warm up followed with seated exercises strengthening lower extremities with various means of resistance. He then performed exercises at balance station including standing hip flexion and standing hip abduction bilaterally for increased strength in lower extremities.  Side-steps performed in // bars today to allow patient to perform functional activities in the home with less difficulty. He finished treatment with 150' lap using SPC with CGA for safety demonstrating antalgic gait pattern with decreased stance time on L LE. Overall, patient performed  exercises well, but continues to have weakness in lower extremities, decreased balance with gait, and gait abnormalities noted. He would benefit from continuing PT treatment to improve on all of these deficits listed above.   OBJECTIVE IMPAIRMENTS: Decreased knowledge of condition, decreased use of DME, decreased mobility, difficulty walking, decreased strength, decreased ROM. ACTIVITY LIMITATIONS: Lifting, standing, walking, squatting, transfers, locomotion level PARTICIPATION LIMITATIONS: Cleaning, laundry, interpersonal relationships, driving, yardwork, community activity.  PERSONAL FACTORS: Age, behavior pattern, education, past/current experiences, transportation, profession  are also affecting patient's functional outcome.  REHAB POTENTIAL: Good CLINICAL DECISION MAKING: Medium  EVALUATION COMPLEXITY: Moderate    GOALS: Goals reviewed with patient? No  SHORT TERM GOALS: Target date: 07/28/24 MMT in BLE hips 5/5 pain free  Baseline: *see eval for detail, Left hip ER painful/weak  Goal status: INITIAL  2.  >936ft c RW to improve limited community distance moblity for independent exercise.  Baseline: 474ft c RW  Goal status: INITIAL  3.  Pt to demonstrate narrow stance on foam balance >30sec, eyes open to show improved Left hip proprioception/motor control.  Baseline: defer to visit 7  Goal status: INITIAL   LONG TERM GOALS: Target date: 08/28/24  Knowledge and confidence in a final DC HEP for conitnued improvements in strength and balance.  Baseline:  Goal status: INITIAL  2.  LEFS survey improvement >20% to indicated reduced disability.  Baseline: 47.5% Goal status: INITIAL  3.  c SPC >1.72m/s  Baseline: 0.45m/s c SPC Goal status: INITIAL  4.  >154ft with DME PRN to improve energy conservation for community AMB.  Baseline: 432ft c RW  Goal status: INITIAL   PLAN:  PT FREQUENCY: 1-2x/week  PT DURATION: 12 weeks  PLANNED INTERVENTIONS:  97110-Therapeutic exercises, 97530- Therapeutic activity, 97112- Neuromuscular re-education, 97535- Self Care, [id number]- Manual therapy, [id number]- Electrical stimulation (unattended), [phone]- Electrical stimulation (manual), Patient/Family education, Balance training, Stair training, Joint mobilization, Joint manipulation, DME instructions, Cryotherapy, and Moist heat  PLAN FOR NEXT SESSION:  Continue working to improve generalized strength and gait with progressive exercises to patient's tolerance.   5:21 PM, 07/05/24 Norman Sharps, PT, DPT Physical Therapist - [facility]    Norman KATHEE Sharps, [place] 07/05/2024, 5:21 PM

## 2024-07-10 ENCOUNTER — Ambulatory Visit

## 2024-07-10 DIAGNOSIS — M25552 Pain in left hip: Secondary | ICD-10-CM

## 2024-07-10 DIAGNOSIS — M25652 Stiffness of left hip, not elsewhere classified: Secondary | ICD-10-CM

## 2024-07-10 DIAGNOSIS — R278 Other lack of coordination: Secondary | ICD-10-CM

## 2024-07-10 DIAGNOSIS — R262 Difficulty in walking, not elsewhere classified: Secondary | ICD-10-CM

## 2024-07-10 DIAGNOSIS — M6281 Muscle weakness (generalized): Secondary | ICD-10-CM

## 2024-07-10 DIAGNOSIS — R2681 Unsteadiness on feet: Secondary | ICD-10-CM

## 2024-07-10 NOTE — Therapy (Signed)
 OUTPATIENT PHYSICAL THERAPY TREATMENT  Patient Name: Brian Callahan MRN: [id number] DOB:[birth year], 83 y.o., male Today's Date: 07/10/2024  END OF SESSION:  PT End of Session - 07/10/24 1322     Visit Number 4    Number of Visits 24    Date for Recertification  09/19/24    Authorization Type [employer]    Authorization Time Period 06/27/24-09/19/2024    Progress Note Due on Visit 10    PT Start Time 1315    PT Stop Time 1355    PT Time Calculation (min) 40 min    Equipment Utilized During Treatment Gait belt    Activity Tolerance Patient tolerated treatment well;No increased pain    Behavior During Therapy [facility] for tasks assessed/performed          Past Medical History:  Diagnosis Date   Anemia    Aortic atherosclerosis    Aortic insufficiency    a. 06/2017 Echo: EF 50% with mild LVH, nl RV function, moderate AI, and mild MR/TR/PR.   Aortic root dilatation    Benign fibroma of prostate    BPH with obstruction/lower urinary tract symptoms    Cerebellar infarct (HCC)    Cerebral microvascular disease    Chest pain    a. 2010 Cath ([facility]): reportedly nl; b. 06/2017 [facility]): EF 63%, mild inf wall ischemia->med rx.   Chronic dyspnea    Chronic GI bleeding    Colon polyps    COPD (chronic obstructive pulmonary disease) (HCC)    Depression    Diaphragm paralysis (RIGHT)    Diastolic dysfunction    Diverticulosis    DM (diabetes mellitus), type 2 (HCC)    Elevated PSA    Embolus of mesenteric artery    Gastritis    GERD (gastroesophageal reflux disease)    Gout    H/O hernia repair mid-90s   x2   History of prostate cancer s/p IMRT    History of [place] spotted fever    HLD (hyperlipidemia)    HTN (hypertension)    Incomplete bladder emptying    Insomnia    a.) takes melatonin PRN   Long term current use of immunosuppressive drug    a.) IL-17A for psoriatic arthritis (secukinumab)   Macular degeneration    Obesity    Osteoarthritis of hip     Psoriasis    Psoriatic arthritis (HCC)    a.) on IL-17A inhibitor (secukinumab)   Pulmonary hypertension (HCC)    S/P insertion of IVC (inferior vena caval) filter    SBO (small bowel obstruction); s/p partial colectomy 1998   Status post bilateral cataract extraction    Urinary frequency    Past Surgical History:  Procedure Laterality Date   APPENDECTOMY     BACK SURGERY     neck and spine 2007   BIOPSY  09/15/2023   Procedure: BIOPSY;  Surgeon: Aundria, Ladell POUR, MD;  Location: [facility] ENDOSCOPY;  Service: Gastroenterology;;   CATARACT EXTRACTION, BILATERAL Bilateral    CERVICAL SPINE SURGERY  2007   CHOLECYSTECTOMY N/A 10/18/2017   Procedure: LAPAROSCOPIC CHOLECYSTECTOMY;  Surgeon: Rodolph Romano, MD;  Location: [facility] ORS;  Service: General;  Laterality: N/A;   COLONOSCOPY     lost 10 units of blood   COLONOSCOPY WITH PROPOFOL  N/A 09/27/2019   Procedure: COLONOSCOPY WITH PROPOFOL ;  Surgeon: [name], Ladell POUR, MD;  Location: [facility] ENDOSCOPY;  Service: Gastroenterology;  Laterality: N/A;   CYSTOSCOPY WITH INSERTION OF UROLIFT N/A 01/23/2020   Procedure: CYSTOSCOPY WITH  INSERTION OF UROLIFT;  Surgeon: Twylla Glendia BROCKS, MD;  Location: [facility] ORS;  Service: Urology;  Laterality: N/A;   ESOPHAGOGASTRODUODENOSCOPY N/A 10/14/2016   Procedure: ESOPHAGOGASTRODUODENOSCOPY (EGD);  Surgeon: Lamar ONEIDA Holmes, MD;  Location: [facility] ENDOSCOPY;  Service: Endoscopy;  Laterality: N/A;   ESOPHAGOGASTRODUODENOSCOPY (EGD) WITH PROPOFOL  N/A 09/27/2019   Procedure: ESOPHAGOGASTRODUODENOSCOPY (EGD) WITH PROPOFOL ;  Surgeon: [name], Ladell POUR, MD;  Location: [facility] ENDOSCOPY;  Service: Gastroenterology;  Laterality: N/A;   FLEXIBLE SIGMOIDOSCOPY N/A 09/15/2023   Procedure: FLEXIBLE SIGMOIDOSCOPY;  Surgeon: [name], Ladell POUR, MD;  Location: [facility] ENDOSCOPY;  Service: Gastroenterology;  Laterality: N/A;  DM   HERNIA REPAIR Bilateral mid-90s   x2  inguinal   IVC FILTER INSERTION N/A 03/06/2024   Procedure: IVC  FILTER INSERTION;  Surgeon: Marea Selinda RAMAN, MD;  Location: [facility] INVASIVE CV LAB;  Service: Cardiovascular;  Laterality: N/A;   IVC FILTER REMOVAL N/A 05/08/2024   Procedure: IVC FILTER REMOVAL;  Surgeon: Marea Selinda RAMAN, MD;  Location: [facility] INVASIVE CV LAB;  Service: Cardiovascular;  Laterality: N/A;   PARTIAL COLECTOMY  1998   POLYPECTOMY  09/15/2023   Procedure: POLYPECTOMY;  Surgeon: [name], Ladell POUR, MD;  Location: [facility] ENDOSCOPY;  Service: Gastroenterology;;   PROSTATE BIOPSY  2013   SURGERY SCROTAL / TESTICULAR     for fibrous growth   TOTAL HIP ARTHROPLASTY Left 03/14/2024   Procedure: ARTHROPLASTY, HIP, TOTAL,POSTERIOR APPROACH;  Surgeon: Edie Norleen PARAS, MD;  Location: [facility] ORS;  Service: Orthopedics;  Laterality: Left;   Patient Active Problem List   Diagnosis Date Noted   Status post total hip replacement, left 03/14/2024   Diastolic dysfunction    Diabetes mellitus type 2 with complications (HCC) 03/23/2018   Chest pain with moderate risk for cardiac etiology 11/29/2017   Positive cardiac stress test 11/29/2017   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 11/08/2017   Esophagitis, reflux 11/08/2017   SOB (shortness of breath) on exertion 07/02/2017   BPH with obstruction/lower urinary tract symptoms 11/21/2015   History of elevated PSA 11/21/2015   H/O gastric ulcer 09/05/2015   Disorder of eyeball 05/23/2015   H/O infectious disease 05/23/2015   Blood glucose elevated 05/23/2015   BP (high blood pressure) 05/23/2015   HLD (hyperlipidemia) 05/23/2015   Psoriasis 05/23/2015   Absolute anemia 01/02/2014   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   Elevated prostate specific antigen (PSA) 01/02/2014   Elevated blood uric acid level 01/02/2014   Enlarged prostate without lower urinary tract symptoms (luts) 01/02/2014   Bleeding gastrointestinal 04/15/2012    PCP: Auston Reyes BIRCH, MD  REFERRING PROVIDER: Kip Lynwood Double, PA-C  REFERRING DIAG: s/p Left THA   THERAPY DIAG:   Stiffness of left hip, not elsewhere classified  Pain in left hip  Difficulty in walking, not elsewhere classified  Muscle weakness (generalized)  Unsteadiness on feet  Other lack of coordination  Rationale for Evaluation and Treatment: rehabilitation   ONSET DATE: August 2025   SUBJECTIVE:   SUBJECTIVE STATEMENT: Pt reports doing well today. Knee feeling better overall. HEP going well.    PERTINENT HISTORY: 83yoM referred to OPPT by orthopedics for ongoing difficulty walking, leg weakness, imbalance since Left THA in August 2025. Pt had a remarkable difficulty with LLE swelling since surgery. Pt saw HHPT 3x/week for 2 weeks. Pt has transitioned to [facility] for AMB outside of house or walker in house if he needs to  PAIN:  Are you having pain? None at rest    PRECAUTIONS: Posterior hip precautions   WEIGHT  BEARING RESTRICTIONS: Yes, WBAT  FALLS:  Has patient fallen in last 6 months? No   LIVING ENVIRONMENT: Lives with: Wife  Lives in: House  Stairs: 4, 1 railing  Has following equipment at home: SPC, BSC, RW   OCCUPATION: retired   PATIENT GOALS: wants to be able to walk the dogs again without DME; wants to have less pain with car transfers.   OBJECTIVE:  Note: Objective measures were completed at Evaluation unless otherwise noted.  PATIENT SURVEYS:  LEFS: 47.5%  LOWER EXTREMITY MMT:  MMT Right eval Left eval  Hip flexion 5/5 5/5  Hip extension N/t N/t  Hip hor abduction 5/5 4+/5  Hip hor adduction 5/5 5/5  Hip internal rotation 5/5 5/5  Hip external rotation 5/5 4+/5*   (Blank rows = not tested)  LOWER EXTREMITY SPECIAL TESTS:    FUNCTIONAL TESTS:  5x STS: 11.67sec, hands free, no pain   : 15.91sec SPC, RW: 13.15sec   : 416ft, 51m48s, pain over final 19ft left hip   GAIT: Distance walked: 420ft Assistive device utilized: Environmental Consultant - 2 wheeled Level of assistance: SBA Comments: pain in left hip in final 91ft  *abducted LLE, moresof with SPC  use, 50% improved with RW                                                                                                                              TREATMENT DATE 07/10/2024 :  -AA/ROM on Nustep Seat 9, arms 9, 5 mnutes at level 3-4   -1045ft AMB overground with 4WW (30m54sec)  -Leg press wellzone: Seat 9 (starting hip flexion ~100 degrees)  -1x25 @ 4 plates  -8k84 @ 6 plates -8k84 @ 7 plates (8 too hard)  *minA of LLE getting into position   -seated marching 1x30 bilat, alternating -seated marching 1x30 bilat, alternating (x 1 secH)  -STS from elevated 1x10   PATIENT EDUCATION:  Education details: [place] Person educated: update minisquats at home to full STS squats  Education method: collaborative learning, deliberate practice, positive reinforcement, explicit instruction, establish rules. Education comprehension: fair  HOME EXERCISE PROGRAM: Access Code: [email or web address] URL: [email or web address] Date: 07/03/2024 Prepared by: Peggye Linear  Exercises - Sit to Stand form HIGH surface  - 2 x daily - 1 sets - 10 reps - Marching at Counter with Minimal Hand Support   - 2 x daily - 1 sets - 30 reps - Side Stepping with Counter Support  - 2 x daily - 1 sets - 20 reps - Supine Bridge  - 2 x daily - 1 sets - 10 reps - Supine March  - 2 x daily - 1 sets - 20 reps   ASSESSMENT:  CLINICAL IMPRESSION: Continued with intervention aimed at cardioaerobic fitness, AMB tolerance, BLE strength, and balance. Overall, patient performed exercises well, but continues to have weakness in lower extremities, decreased balance with gait, and gait abnormalities noted. Pt has no pain in glutes during activity but toward  end of session can feel the fatigue in his leg. He would benefit from continuing PT treatment to improve on all of these deficits listed above.   OBJECTIVE IMPAIRMENTS: Decreased knowledge of condition, decreased use of DME, decreased mobility, difficulty walking, decreased  strength, decreased ROM. ACTIVITY LIMITATIONS: Lifting, standing, walking, squatting, transfers, locomotion level PARTICIPATION LIMITATIONS: Cleaning, laundry, interpersonal relationships, driving, yardwork, community activity.  PERSONAL FACTORS: Age, behavior pattern, education, past/current experiences, transportation, profession  are also affecting patient's functional outcome.  REHAB POTENTIAL: Good CLINICAL DECISION MAKING: Medium  EVALUATION COMPLEXITY: Moderate    GOALS: Goals reviewed with patient? No  SHORT TERM GOALS: Target date: 07/28/24 MMT in BLE hips 5/5 pain free  Baseline: *see eval for detail, Left hip ER painful/weak  Goal status: INITIAL  2.  >936ft c RW to improve limited community distance moblity for independent exercise.  Baseline: 468ft c RW  Goal status: INITIAL  3.  Pt to demonstrate narrow stance on foam balance >30sec, eyes open to show improved Left hip proprioception/motor control.  Baseline: defer to visit 7  Goal status: INITIAL   LONG TERM GOALS: Target date: 08/28/24  Knowledge and confidence in a final DC HEP for conitnued improvements in strength and balance.  Baseline:  Goal status: INITIAL  2.  LEFS survey improvement >20% to indicated reduced disability.  Baseline: 47.5% Goal status: INITIAL  3.  c SPC >1.59m/s  Baseline: 0.47m/s c SPC Goal status: INITIAL  4.  >1542ft with DME PRN to improve energy conservation for community AMB.  Baseline: 465ft c RW  Goal status: INITIAL   PLAN:  PT FREQUENCY: 1-2x/week  PT DURATION: 12 weeks  PLANNED INTERVENTIONS: 97110-Therapeutic exercises, 97530- Therapeutic activity, 97112- Neuromuscular re-education, 97535- Self Care, [id number]- Manual therapy, [id number]- Electrical stimulation (unattended), [phone]- Electrical stimulation (manual), Patient/Family education, Balance training, Stair training, Joint mobilization, Joint manipulation, DME instructions, Cryotherapy, and Moist  heat  PLAN FOR NEXT SESSION:  Continue working to improve generalized strength and gait with progressive exercises to patient's tolerance.   1:25 PM, 07/10/2024 Peggye JAYSON Linear, PT, DPT Physical Therapist - [facility]  Outpatient Physical Therapy- [facility] [phone]

## 2024-07-11 ENCOUNTER — Ambulatory Visit

## 2024-07-13 ENCOUNTER — Ambulatory Visit

## 2024-07-13 DIAGNOSIS — M25552 Pain in left hip: Secondary | ICD-10-CM

## 2024-07-13 DIAGNOSIS — M6281 Muscle weakness (generalized): Secondary | ICD-10-CM

## 2024-07-13 DIAGNOSIS — M25652 Stiffness of left hip, not elsewhere classified: Secondary | ICD-10-CM | POA: Diagnosis not present

## 2024-07-13 DIAGNOSIS — R262 Difficulty in walking, not elsewhere classified: Secondary | ICD-10-CM

## 2024-07-13 DIAGNOSIS — R2681 Unsteadiness on feet: Secondary | ICD-10-CM

## 2024-07-13 NOTE — Therapy (Signed)
 OUTPATIENT PHYSICAL THERAPY TREATMENT  Patient Name: Brian Callahan MRN: [id number] DOB:08-13-1940, 83 y.o., male Today's Date: 07/13/2024  END OF SESSION:  PT End of Session - 07/13/24 1508     Visit Number 5    Number of Visits 24    Date for Recertification  09/19/24    Authorization Type [employer]    Authorization Time Period 06/27/24-09/19/2024    Progress Note Due on Visit 10    PT Start Time 1450    PT Stop Time 1530    PT Time Calculation (min) 40 min    Activity Tolerance Patient tolerated treatment well;No increased pain;Patient limited by fatigue    Behavior During Therapy [facility] for tasks assessed/performed          Past Medical History:  Diagnosis Date   Anemia    Aortic atherosclerosis    Aortic insufficiency    a. 06/2017 Echo: EF 50% with mild LVH, nl RV function, moderate AI, and mild MR/TR/PR.   Aortic root dilatation    Benign fibroma of prostate    BPH with obstruction/lower urinary tract symptoms    Cerebellar infarct (HCC)    Cerebral microvascular disease    Chest pain    a. 2010 Cath ([facility]): reportedly nl; b. 06/2017 [facility]): EF 63%, mild inf wall ischemia->med rx.   Chronic dyspnea    Chronic GI bleeding    Colon polyps    COPD (chronic obstructive pulmonary disease) (HCC)    Depression    Diaphragm paralysis (RIGHT)    Diastolic dysfunction    Diverticulosis    DM (diabetes mellitus), type 2 (HCC)    Elevated PSA    Embolus of mesenteric artery    Gastritis    GERD (gastroesophageal reflux disease)    Gout    H/O hernia repair mid-90s   x2   History of prostate cancer s/p IMRT    History of [place] spotted fever    HLD (hyperlipidemia)    HTN (hypertension)    Incomplete bladder emptying    Insomnia    a.) takes melatonin PRN   Long term current use of immunosuppressive drug    a.) IL-17A for psoriatic arthritis (secukinumab)   Macular degeneration    Obesity    Osteoarthritis of hip    Psoriasis    Psoriatic  arthritis (HCC)    a.) on IL-17A inhibitor (secukinumab)   Pulmonary hypertension (HCC)    S/P insertion of IVC (inferior vena caval) filter    SBO (small bowel obstruction); s/p partial colectomy 1998   Status post bilateral cataract extraction    Urinary frequency    Past Surgical History:  Procedure Laterality Date   APPENDECTOMY     BACK SURGERY     neck and spine 2007   BIOPSY  09/15/2023   Procedure: BIOPSY;  Surgeon: Aundria, Ladell POUR, MD;  Location: [facility] ENDOSCOPY;  Service: Gastroenterology;;   CATARACT EXTRACTION, BILATERAL Bilateral    CERVICAL SPINE SURGERY  2007   CHOLECYSTECTOMY N/A 10/18/2017   Procedure: LAPAROSCOPIC CHOLECYSTECTOMY;  Surgeon: Rodolph Romano, MD;  Location: [facility] ORS;  Service: General;  Laterality: N/A;   COLONOSCOPY     lost 10 units of blood   COLONOSCOPY WITH PROPOFOL  N/A 09/27/2019   Procedure: COLONOSCOPY WITH PROPOFOL ;  Surgeon: [name], Ladell POUR, MD;  Location: [facility] ENDOSCOPY;  Service: Gastroenterology;  Laterality: N/A;   CYSTOSCOPY WITH INSERTION OF UROLIFT N/A 01/23/2020   Procedure: CYSTOSCOPY WITH INSERTION OF UROLIFT;  Surgeon: Twylla,  Glendia BROCKS, MD;  Location: [facility] ORS;  Service: Urology;  Laterality: N/A;   ESOPHAGOGASTRODUODENOSCOPY N/A 10/14/2016   Procedure: ESOPHAGOGASTRODUODENOSCOPY (EGD);  Surgeon: Lamar ONEIDA Holmes, MD;  Location: [facility] ENDOSCOPY;  Service: Endoscopy;  Laterality: N/A;   ESOPHAGOGASTRODUODENOSCOPY (EGD) WITH PROPOFOL  N/A 09/27/2019   Procedure: ESOPHAGOGASTRODUODENOSCOPY (EGD) WITH PROPOFOL ;  Surgeon: [name], Ladell POUR, MD;  Location: [facility] ENDOSCOPY;  Service: Gastroenterology;  Laterality: N/A;   FLEXIBLE SIGMOIDOSCOPY N/A 09/15/2023   Procedure: FLEXIBLE SIGMOIDOSCOPY;  Surgeon: [name], Ladell POUR, MD;  Location: [facility] ENDOSCOPY;  Service: Gastroenterology;  Laterality: N/A;  DM   HERNIA REPAIR Bilateral mid-90s   x2  inguinal   IVC FILTER INSERTION N/A 03/06/2024   Procedure: IVC FILTER INSERTION;   Surgeon: Marea Selinda RAMAN, MD;  Location: [facility] INVASIVE CV LAB;  Service: Cardiovascular;  Laterality: N/A;   IVC FILTER REMOVAL N/A 05/08/2024   Procedure: IVC FILTER REMOVAL;  Surgeon: Marea Selinda RAMAN, MD;  Location: [facility] INVASIVE CV LAB;  Service: Cardiovascular;  Laterality: N/A;   PARTIAL COLECTOMY  1998   POLYPECTOMY  09/15/2023   Procedure: POLYPECTOMY;  Surgeon: [name], Ladell POUR, MD;  Location: [facility] ENDOSCOPY;  Service: Gastroenterology;;   PROSTATE BIOPSY  2013   SURGERY SCROTAL / TESTICULAR     for fibrous growth   TOTAL HIP ARTHROPLASTY Left 03/14/2024   Procedure: ARTHROPLASTY, HIP, TOTAL,POSTERIOR APPROACH;  Surgeon: Edie Norleen PARAS, MD;  Location: [facility] ORS;  Service: Orthopedics;  Laterality: Left;   Patient Active Problem List   Diagnosis Date Noted   Status post total hip replacement, left 03/14/2024   Diastolic dysfunction    Diabetes mellitus type 2 with complications (HCC) 03/23/2018   Chest pain with moderate risk for cardiac etiology 11/29/2017   Positive cardiac stress test 11/29/2017   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 11/08/2017   Esophagitis, reflux 11/08/2017   SOB (shortness of breath) on exertion 07/02/2017   BPH with obstruction/lower urinary tract symptoms 11/21/2015   History of elevated PSA 11/21/2015   H/O gastric ulcer 09/05/2015   Disorder of eyeball 05/23/2015   H/O infectious disease 05/23/2015   Blood glucose elevated 05/23/2015   BP (high blood pressure) 05/23/2015   HLD (hyperlipidemia) 05/23/2015   Psoriasis 05/23/2015   Absolute anemia 01/02/2014   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   Elevated prostate specific antigen (PSA) 01/02/2014   Elevated blood uric acid level 01/02/2014   Enlarged prostate without lower urinary tract symptoms (luts) 01/02/2014   Bleeding gastrointestinal 04/15/2012    PCP: Auston Reyes BIRCH, MD  REFERRING PROVIDER: Kip Lynwood Double, PA-C  REFERRING DIAG: s/p Left THA   THERAPY DIAG:  Stiffness of left hip,  not elsewhere classified  Pain in left hip  Difficulty in walking, not elsewhere classified  Muscle weakness (generalized)  Unsteadiness on feet  Rationale for Evaluation and Treatment: rehabilitation   ONSET DATE: August 2025   SUBJECTIVE:   SUBJECTIVE STATEMENT: Pt reports doing well today. Knee feeling better overall. HEP going well. Pt is tired from sleepless night, has been constipated and not feeling well.   PERTINENT HISTORY: 83yoM referred to OPPT by orthopedics for ongoing difficulty walking, leg weakness, imbalance since Left THA in August 2025. Pt had a remarkable difficulty with LLE swelling since surgery. Pt saw HHPT 3x/week for 2 weeks. Pt has transitioned to [facility] for AMB outside of house or walker in house if he needs to  PAIN:  Are you having pain? None at rest    PRECAUTIONS: Posterior hip precautions  WEIGHT BEARING RESTRICTIONS: Yes, WBAT  FALLS:  Has patient fallen in last 6 months? No   LIVING ENVIRONMENT: Lives with: Wife  Lives in: House  Stairs: 4, 1 railing  Has following equipment at home: SPC, BSC, RW   OCCUPATION: retired   PATIENT GOALS: wants to be able to walk the dogs again without DME; wants to have less pain with car transfers.   OBJECTIVE:  Note: Objective measures were completed at Evaluation unless otherwise noted.  PATIENT SURVEYS:  LEFS: 47.5%  LOWER EXTREMITY MMT:  MMT Right eval Left eval  Hip flexion 5/5 5/5  Hip extension N/t N/t  Hip hor abduction 5/5 4+/5  Hip hor adduction 5/5 5/5  Hip internal rotation 5/5 5/5  Hip external rotation 5/5 4+/5*   (Blank rows = not tested)  LOWER EXTREMITY SPECIAL TESTS:    FUNCTIONAL TESTS:  5x STS: 11.67sec, hands free, no pain   : 15.91sec SPC, RW: 13.15sec   : 417ft, 36m48s, pain over final 34ft left hip   GAIT: Distance walked: 461ft Assistive device utilized: [employer]ant - 2 wheeled Level of assistance: SBA Comments: pain in left hip in final 27ft  *abducted  LLE, moresof with SPC use, 50% improved with RW                                                                                                                              TREATMENT DATE 07/13/2024 :  -AA/ROM on Nustep Seat 9, arms 9, 5 mnutes at level 3 (gray)     -1070ft AMB overground with 5TT (18m54sec)  -Leg press wellzone: Seat 9, 6 plates 3k74 (pt realing enjoying slow reps)  -seated marching 1x30, alteranting x3secH  -Leg press wellzone: Seat 7, 6 plates 3k74 (bring)  -seated marching 1x30, alteranting x3secH  *meant to put on 7 plates but got confused today       PATIENT EDUCATION:  Education details: Severiano Person educated: update minisquats at home to full STS squats  Education method: collaborative learning, deliberate practice, positive reinforcement, explicit instruction, establish rules. Education comprehension: fair  HOME EXERCISE PROGRAM: Access Code: [email or web address] URL: [email or web address] Date: 07/03/2024 Prepared by: Peggye Linear  Exercises - Sit to Stand form HIGH surface  - 2 x daily - 1 sets - 10 reps - Marching at Counter with Minimal Hand Support   - 2 x daily - 1 sets - 30 reps - Side Stepping with Counter Support  - 2 x daily - 1 sets - 20 reps - Supine Bridge  - 2 x daily - 1 sets - 10 reps - Supine March  - 2 x daily - 1 sets - 20 reps   ASSESSMENT:  CLINICAL IMPRESSION: Continued with intervention aimed at cardioaerobic fitness, AMB tolerance, BLE strength, and balance. Pt able to advance rep son leg press today, also advanced depth of leg press while maintaining precautions. Lower legs remain bright red, with signs of excoriation, pt reports as baseline,  constant itchiness since changing his psoraiasis medication years ago. Noted postural limitations in gait, in // bars pt unable to maintain Left TKE and flat foot weight bearing in attempting upright stance, strong suspicion of Left jip flexion contracture- pt reports recliner sleeping up  until last night. Will plan on assessment next visit to determine trunk ROM limitations Overall, patient performed exercises well, but continues to have weakness in lower extremities, decreased balance with gait, and gait abnormalities noted. Pt has no pain in glutes during activity but toward end of session can feel the fatigue in his leg. He would benefit from continuing PT treatment to improve on all of these deficits listed above.   OBJECTIVE IMPAIRMENTS: Decreased knowledge of condition, decreased use of DME, decreased mobility, difficulty walking, decreased strength, decreased ROM. ACTIVITY LIMITATIONS: Lifting, standing, walking, squatting, transfers, locomotion level PARTICIPATION LIMITATIONS: Cleaning, laundry, interpersonal relationships, driving, yardwork, community activity.  PERSONAL FACTORS: Age, behavior pattern, education, past/current experiences, transportation, profession  are also affecting patient's functional outcome.  REHAB POTENTIAL: Good CLINICAL DECISION MAKING: Medium  EVALUATION COMPLEXITY: Moderate    GOALS: Goals reviewed with patient? No  SHORT TERM GOALS: Target date: 07/28/24 MMT in BLE hips 5/5 pain free  Baseline: *see eval for detail, Left hip ER painful/weak  Goal status: INITIAL  2.  >969ft c RW to improve limited community distance moblity for independent exercise.  Baseline: 464ft c RW  Goal status: INITIAL  3.  Pt to demonstrate narrow stance on foam balance >30sec, eyes open to show improved Left hip proprioception/motor control.  Baseline: defer to visit 7  Goal status: INITIAL   LONG TERM GOALS: Target date: 08/28/24  Knowledge and confidence in a final DC HEP for conitnued improvements in strength and balance.  Baseline:  Goal status: INITIAL  2.  LEFS survey improvement >20% to indicated reduced disability.  Baseline: 47.5% Goal status: INITIAL  3.  c SPC >1.74m/s  Baseline: 0.60m/s c SPC Goal status: INITIAL  4.   >1534ft with DME PRN to improve energy conservation for community AMB.  Baseline: 466ft c RW  Goal status: INITIAL   PLAN:  PT FREQUENCY: 1-2x/week  PT DURATION: 12 weeks  PLANNED INTERVENTIONS: 97110-Therapeutic exercises, 97530- Therapeutic activity, [id number]- Neuromuscular re-education, 97535- Self Care, [id number]- Manual therapy, [id number]- Electrical stimulation (unattended), [phone]- Electrical stimulation (manual), Patient/Family education, Balance training, Stair training, Joint mobilization, Joint manipulation, DME instructions, Cryotherapy, and Moist heat  PLAN FOR NEXT SESSION:  Evaluate for Left hip flexion contracture, extension ROM limitations, apply stretches for HEP as appropriate, increase gait training with RW, tandem balance to retrain leg position.    3:12 PM, 07/13/2024 Peggye JAYSON Linear, PT, DPT Physical Therapist - [place] [facility]  Outpatient Physical Therapy- Main [facility] [phone]

## 2024-07-18 ENCOUNTER — Ambulatory Visit

## 2024-07-18 DIAGNOSIS — R262 Difficulty in walking, not elsewhere classified: Secondary | ICD-10-CM

## 2024-07-18 DIAGNOSIS — M25552 Pain in left hip: Secondary | ICD-10-CM

## 2024-07-18 DIAGNOSIS — M25652 Stiffness of left hip, not elsewhere classified: Secondary | ICD-10-CM

## 2024-07-18 DIAGNOSIS — M6281 Muscle weakness (generalized): Secondary | ICD-10-CM

## 2024-07-18 NOTE — Therapy (Signed)
 " OUTPATIENT PHYSICAL THERAPY TREATMENT  Patient Name: Brian Callahan MRN: [id number] DOB:02/08/1941, 83 y.o., male Today's Date: 07/18/2024  END OF SESSION:  PT End of Session - 07/18/24 0845     Visit Number 6    Number of Visits 24    Date for Recertification  09/19/24    Authorization Type [facility] Medicare    Authorization Time Period 06/27/24-09/19/2024    Progress Note Due on Visit 10    PT Start Time 0838    PT Stop Time 0918    PT Time Calculation (min) 40 min    Equipment Utilized During Treatment Gait belt    Activity Tolerance Patient tolerated treatment well;No increased pain;Patient limited by fatigue    Behavior During Therapy [facility] for tasks assessed/performed          Past Medical History:  Diagnosis Date   Anemia    Aortic atherosclerosis    Aortic insufficiency    a. 06/2017 Echo: EF 50% with mild LVH, nl RV function, moderate AI, and mild MR/TR/PR.   Aortic root dilatation    Benign fibroma of prostate    BPH with obstruction/lower urinary tract symptoms    Cerebellar infarct (HCC)    Cerebral microvascular disease    Chest pain    a. 2010 Cath ([facility]): reportedly nl; b. 06/2017 [facility]): EF 63%, mild inf wall ischemia->med rx.   Chronic dyspnea    Chronic GI bleeding    Colon polyps    COPD (chronic obstructive pulmonary disease) (HCC)    Depression    Diaphragm paralysis (RIGHT)    Diastolic dysfunction    Diverticulosis    DM (diabetes mellitus), type 2 (HCC)    Elevated PSA    Embolus of mesenteric artery    Gastritis    GERD (gastroesophageal reflux disease)    Gout    H/O hernia repair mid-90s   x2   History of prostate cancer s/p IMRT    History of [place] spotted fever    HLD (hyperlipidemia)    HTN (hypertension)    Incomplete bladder emptying    Insomnia    a.) takes melatonin PRN   Long term current use of immunosuppressive drug    a.) IL-17A for psoriatic arthritis (secukinumab)   Macular degeneration    Obesity     Osteoarthritis of hip    Psoriasis    Psoriatic arthritis (HCC)    a.) on IL-17A inhibitor (secukinumab)   Pulmonary hypertension (HCC)    S/P insertion of IVC (inferior vena caval) filter    SBO (small bowel obstruction); s/p partial colectomy 1998   Status post bilateral cataract extraction    Urinary frequency    Past Surgical History:  Procedure Laterality Date   APPENDECTOMY     BACK SURGERY     neck and spine 2007   BIOPSY  09/15/2023   Procedure: BIOPSY;  Surgeon: Aundria, Ladell POUR, MD;  Location: [facility] ENDOSCOPY;  Service: Gastroenterology;;   CATARACT EXTRACTION, BILATERAL Bilateral    CERVICAL SPINE SURGERY  2007   CHOLECYSTECTOMY N/A 10/18/2017   Procedure: LAPAROSCOPIC CHOLECYSTECTOMY;  Surgeon: Rodolph Romano, MD;  Location: [facility] ORS;  Service: General;  Laterality: N/A;   COLONOSCOPY     lost 10 units of blood   COLONOSCOPY WITH PROPOFOL  N/A 09/27/2019   Procedure: COLONOSCOPY WITH PROPOFOL ;  Surgeon: [name], Ladell POUR, MD;  Location: [facility] ENDOSCOPY;  Service: Gastroenterology;  Laterality: N/A;   CYSTOSCOPY WITH INSERTION OF UROLIFT N/A 01/23/2020  Procedure: CYSTOSCOPY WITH INSERTION OF UROLIFT;  Surgeon: Twylla Glendia BROCKS, MD;  Location: [facility] ORS;  Service: Urology;  Laterality: N/A;   ESOPHAGOGASTRODUODENOSCOPY N/A 10/14/2016   Procedure: ESOPHAGOGASTRODUODENOSCOPY (EGD);  Surgeon: Lamar ONEIDA Holmes, MD;  Location: [facility] ENDOSCOPY;  Service: Endoscopy;  Laterality: N/A;   ESOPHAGOGASTRODUODENOSCOPY (EGD) WITH PROPOFOL  N/A 09/27/2019   Procedure: ESOPHAGOGASTRODUODENOSCOPY (EGD) WITH PROPOFOL ;  Surgeon: [name], Ladell POUR, MD;  Location: [facility] ENDOSCOPY;  Service: Gastroenterology;  Laterality: N/A;   FLEXIBLE SIGMOIDOSCOPY N/A 09/15/2023   Procedure: FLEXIBLE SIGMOIDOSCOPY;  Surgeon: [name], Ladell POUR, MD;  Location: [facility] ENDOSCOPY;  Service: Gastroenterology;  Laterality: N/A;  DM   HERNIA REPAIR Bilateral mid-90s   x2  inguinal   IVC FILTER INSERTION N/A  03/06/2024   Procedure: IVC FILTER INSERTION;  Surgeon: Marea Selinda RAMAN, MD;  Location: [facility] INVASIVE CV LAB;  Service: Cardiovascular;  Laterality: N/A;   IVC FILTER REMOVAL N/A 05/08/2024   Procedure: IVC FILTER REMOVAL;  Surgeon: Marea Selinda RAMAN, MD;  Location: [facility] INVASIVE CV LAB;  Service: Cardiovascular;  Laterality: N/A;   PARTIAL COLECTOMY  1998   POLYPECTOMY  09/15/2023   Procedure: POLYPECTOMY;  Surgeon: [name], Ladell POUR, MD;  Location: [facility] ENDOSCOPY;  Service: Gastroenterology;;   PROSTATE BIOPSY  2013   SURGERY SCROTAL / TESTICULAR     for fibrous growth   TOTAL HIP ARTHROPLASTY Left 03/14/2024   Procedure: ARTHROPLASTY, HIP, TOTAL,POSTERIOR APPROACH;  Surgeon: Edie Norleen PARAS, MD;  Location: [facility] ORS;  Service: Orthopedics;  Laterality: Left;   Patient Active Problem List   Diagnosis Date Noted   Status post total hip replacement, left 03/14/2024   Diastolic dysfunction    Diabetes mellitus type 2 with complications (HCC) 03/23/2018   Chest pain with moderate risk for cardiac etiology 11/29/2017   Positive cardiac stress test 11/29/2017   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 11/08/2017   Esophagitis, reflux 11/08/2017   SOB (shortness of breath) on exertion 07/02/2017   BPH with obstruction/lower urinary tract symptoms 11/21/2015   History of elevated PSA 11/21/2015   H/O gastric ulcer 09/05/2015   Disorder of eyeball 05/23/2015   H/O infectious disease 05/23/2015   Blood glucose elevated 05/23/2015   BP (high blood pressure) 05/23/2015   HLD (hyperlipidemia) 05/23/2015   Psoriasis 05/23/2015   Absolute anemia 01/02/2014   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   Elevated prostate specific antigen (PSA) 01/02/2014   Elevated blood uric acid level 01/02/2014   Enlarged prostate without lower urinary tract symptoms (luts) 01/02/2014   Bleeding gastrointestinal 04/15/2012    PCP: Auston Reyes BIRCH, MD  REFERRING PROVIDER: Kip Lynwood Double, PA-C  REFERRING DIAG: s/p  Left THA   THERAPY DIAG:  Stiffness of left hip, not elsewhere classified  Pain in left hip  Difficulty in walking, not elsewhere classified  Muscle weakness (generalized)  Rationale for Evaluation and Treatment: rehabilitation   ONSET DATE: August 2025   SUBJECTIVE:   SUBJECTIVE STATEMENT: Pt reports doing better today, but 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    PERTINENT HISTORY: 83yoM referred to OPPT by orthopedics for ongoing difficulty walking, leg weakness, imbalance since Left THA in August 2025. Pt had a remarkable difficulty with LLE swelling since surgery. Pt saw HHPT 3x/week for 2 weeks. Pt has transitioned to [facility] for AMB outside of house or walker in house if he needs to  PAIN:  Are you having pain? None at rest  PRECAUTIONS: Posterior hip precautions   WEIGHT BEARING RESTRICTIONS: Yes, WBAT  FALLS:  Has patient fallen in last 6 months? No   LIVING ENVIRONMENT: Lives with: Wife  Lives in: House  Stairs: 4, 1 railing  Has following equipment at home: SPC, BSC, RW   OCCUPATION: retired   PATIENT GOALS: wants to be able to walk the dogs again without DME; wants to have less pain with car transfers.   OBJECTIVE:  Note: Objective measures were completed at Evaluation unless otherwise noted.  PATIENT SURVEYS:  LEFS: 47.5%  LOWER EXTREMITY MMT:  MMT Right eval Left eval  Hip flexion 5/5 5/5  Hip extension N/t N/t  Hip hor abduction 5/5 4+/5  Hip hor adduction 5/5 5/5  Hip internal rotation 5/5 5/5  Hip external rotation 5/5 4+/5*   (Blank rows = not tested)  LOWER EXTEMITY ROM:   Left hip extension ROM: -17deg Left knee extension: -20 deg  LOWER EXTREMITY SPECIAL TESTS:    FUNCTIONAL TESTS:  5x STS: 11.67sec, hands free, no pain   : 15.91sec SPC, RW: 13.15sec   : 469ft, 15m48s, pain over final 65ft left hip   GAIT: Distance walked:  477ft Assistive device utilized: Environmental Consultant - 2 wheeled Level of assistance: SBA Comments: pain in left hip in final 60ft  *abducted LLE, moresof with SPC use, 50% improved with RW                                                                                                                              TREATMENT DATE 07/18/2024 :  -AA/ROM on Nustep Seat 9, arms 9, 5 mnutes at level 3 (gray)  -minA EOB to supine:  Left hip extension ROM: -17 deg Left knee extension: -20 deg -Standing mobilization with movement: forward trunk/hip extension c mob belt around distal anterior thigh 1x12 -standing lunge stance (Rt forward step) repeated trunk extension x15 (difficulty teaching this, feeling stretch, 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) *seated rest  -standing repeat trunk extension over // bar x25 (emphasis on hip extension ROM)  *seated rest -standing repeat trunk extension over // bar x25 (emphasis on hip extenson ROM)  *seated rest -education on lacking ROM in hip  -side stepping in // bars in // bars 3x bilat (cues for small steps and 2 hand support)  -alternate forward backward stepping 3x (cues for tall posture and symmetrical stepping pattern)  *Issued extension over counter for hip ip extension AA/ROM    PATIENT EDUCATION:  Education details: Sneijder Person educated: update minisquats at home to full STS squats  Education method: collaborative learning, deliberate practice, positive reinforcement, explicit instruction, establish rules. Education comprehension: fair  HOME EXERCISE PROGRAM: Access Code: [email or web address] URL: [email or web address] Date: 07/18/2024 Prepared by: Peggye Linear  Exercises - Sit to Stand form HIGH surface  - 2 x daily - 1 sets - 10 reps - Marching at Counter with Minimal Hand Support   - 2 x daily - 1  sets - 30 reps - Side Stepping with Counter Support  - 2 x daily - 1 sets - 20 reps - Supine Bridge  - 2 x daily - 1 sets - 10 reps - Supine  March  - 2 x daily - 1 sets - 20 reps - Standing Lumbar Extension with Counter  - 3 x daily - 7 x weekly - 1 sets - 25 reps  ASSESSMENT:  CLINICAL IMPRESSION: 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 Also di dformal ROM of Left hip and knee extension, revealing of the extent of Left hip flexion contracture. Pt does not tolerate lying flat supine, hence we spent time trying a variety of other stretching options for clinic and for home, ultimately finding most potential in meckenzie based extension over countertop. Finished up with step-based gluteal strengthening and motor control. He would benefit from continuing PT treatment to improve on all of these deficits listed above.   OBJECTIVE IMPAIRMENTS: Decreased knowledge of condition, decreased use of DME, decreased mobility, difficulty walking, decreased strength, decreased ROM. ACTIVITY LIMITATIONS: Lifting, standing, walking, squatting, transfers, locomotion level PARTICIPATION LIMITATIONS: Cleaning, laundry, interpersonal relationships, driving, yardwork, community activity.  PERSONAL FACTORS: Age, behavior pattern, education, past/current experiences, transportation, profession  are also affecting patient's functional outcome.  REHAB POTENTIAL: Good CLINICAL DECISION MAKING: Medium  EVALUATION COMPLEXITY: Moderate    GOALS: Goals reviewed with patient? No  SHORT TERM GOALS: Target date: 07/28/24 MMT in BLE hips 5/5 pain free  Baseline: *see eval for detail, Left hip ER painful/weak  Goal status: INITIAL  2.  >94ft c RW to improve limited community distance moblity for independent exercise.  Baseline: 485ft c RW  Goal status: INITIAL  3.  Pt to demonstrate narrow stance on foam balance >30sec, eyes open to show improved Left hip proprioception/motor control.  Baseline: defer to visit 7  Goal status: INITIAL   LONG TERM GOALS: Target date: 08/28/24  Knowledge and confidence in a final DC HEP for  conitnued improvements in strength and balance.  Baseline:  Goal status: INITIAL  2.  LEFS survey improvement >20% to indicated reduced disability.  Baseline: 47.5% Goal status: INITIAL  3.  c SPC >1.41m/s  Baseline: 0.29m/s c SPC Goal status: INITIAL  4.  >1532ft with DME PRN to improve energy conservation for community AMB.  Baseline: 418ft c RW  Goal status: INITIAL   PLAN:  PT FREQUENCY: 1-2x/week  PT DURATION: 12 weeks  PLANNED INTERVENTIONS: 97110-Therapeutic exercises, 97530- Therapeutic activity, [id number]- Neuromuscular re-education, 97535- Self Care, [id number]- Manual therapy, [id number]- Electrical stimulation (unattended), [phone]- Electrical stimulation (manual), Patient/Family education, Balance training, Stair training, Joint mobilization, Joint manipulation, DME instructions, Cryotherapy, and Moist heat  PLAN FOR NEXT SESSION:  extension ROM limitations, apply stretches for HEP as appropriate, increase gait training with RW, tandem balance to retrain leg position.    8:47 AM, 07/18/2024 Peggye JAYSON Linear, PT, DPT Physical Therapist - [facility]  Outpatient Physical Therapy- Main [facility] [phone]     "

## 2024-07-25 ENCOUNTER — Ambulatory Visit: Admitting: Physical Therapy

## 2024-07-25 DIAGNOSIS — M6281 Muscle weakness (generalized): Secondary | ICD-10-CM

## 2024-07-25 DIAGNOSIS — R262 Difficulty in walking, not elsewhere classified: Secondary | ICD-10-CM

## 2024-07-25 DIAGNOSIS — M25652 Stiffness of left hip, not elsewhere classified: Secondary | ICD-10-CM

## 2024-07-25 DIAGNOSIS — M25552 Pain in left hip: Secondary | ICD-10-CM

## 2024-07-25 DIAGNOSIS — R2681 Unsteadiness on feet: Secondary | ICD-10-CM

## 2024-07-25 DIAGNOSIS — R278 Other lack of coordination: Secondary | ICD-10-CM

## 2024-07-25 NOTE — Therapy (Signed)
 " OUTPATIENT PHYSICAL THERAPY TREATMENT  Patient Name: Brian Callahan MRN: [id number] DOB:[birth year], 83 y.o., male Today's Date: 07/25/2024  END OF SESSION:  PT End of Session - 07/25/24 1117     Visit Number 7    Number of Visits 24    Date for Recertification  09/19/24    Authorization Type [employer]    Authorization Time Period 06/27/24-09/19/2024    Progress Note Due on Visit 10    PT Start Time 1105    PT Stop Time 1145    PT Time Calculation (min) 40 min    Equipment Utilized During Treatment Gait belt    Activity Tolerance Patient tolerated treatment well;No increased pain;Patient limited by fatigue    Behavior During Therapy [facility] for tasks assessed/performed          Past Medical History:  Diagnosis Date   Anemia    Aortic atherosclerosis    Aortic insufficiency    a. 06/2017 Echo: EF 50% with mild LVH, nl RV function, moderate AI, and mild MR/TR/PR.   Aortic root dilatation    Benign fibroma of prostate    BPH with obstruction/lower urinary tract symptoms    Cerebellar infarct (HCC)    Cerebral microvascular disease    Chest pain    a. 2010 Cath ([facility]): reportedly nl; b. 06/2017 [facility]): EF 63%, mild inf wall ischemia->med rx.   Chronic dyspnea    Chronic GI bleeding    Colon polyps    COPD (chronic obstructive pulmonary disease) (HCC)    Depression    Diaphragm paralysis (RIGHT)    Diastolic dysfunction    Diverticulosis    DM (diabetes mellitus), type 2 (HCC)    Elevated PSA    Embolus of mesenteric artery    Gastritis    GERD (gastroesophageal reflux disease)    Gout    H/O hernia repair mid-90s   x2   History of prostate cancer s/p IMRT    History of [place] spotted fever    HLD (hyperlipidemia)    HTN (hypertension)    Incomplete bladder emptying    Insomnia    a.) takes melatonin PRN   Long term current use of immunosuppressive drug    a.) IL-17A for psoriatic arthritis (secukinumab)   Macular degeneration    Obesity     Osteoarthritis of hip    Psoriasis    Psoriatic arthritis (HCC)    a.) on IL-17A inhibitor (secukinumab)   Pulmonary hypertension (HCC)    S/P insertion of IVC (inferior vena caval) filter    SBO (small bowel obstruction); s/p partial colectomy 1998   Status post bilateral cataract extraction    Urinary frequency    Past Surgical History:  Procedure Laterality Date   APPENDECTOMY     BACK SURGERY     neck and spine 2007   BIOPSY  09/15/2023   Procedure: BIOPSY;  Surgeon: Aundria, Ladell POUR, MD;  Location: [facility] ENDOSCOPY;  Service: Gastroenterology;;   CATARACT EXTRACTION, BILATERAL Bilateral    CERVICAL SPINE SURGERY  2007   CHOLECYSTECTOMY N/A 10/18/2017   Procedure: LAPAROSCOPIC CHOLECYSTECTOMY;  Surgeon: Rodolph Romano, MD;  Location: [facility] ORS;  Service: General;  Laterality: N/A;   COLONOSCOPY     lost 10 units of blood   COLONOSCOPY WITH PROPOFOL  N/A 09/27/2019   Procedure: COLONOSCOPY WITH PROPOFOL ;  Surgeon: [name], Ladell POUR, MD;  Location: [facility] ENDOSCOPY;  Service: Gastroenterology;  Laterality: N/A;   CYSTOSCOPY WITH INSERTION OF UROLIFT N/A 01/23/2020  Procedure: CYSTOSCOPY WITH INSERTION OF UROLIFT;  Surgeon: Twylla Glendia BROCKS, MD;  Location: [facility] ORS;  Service: Urology;  Laterality: N/A;   ESOPHAGOGASTRODUODENOSCOPY N/A 10/14/2016   Procedure: ESOPHAGOGASTRODUODENOSCOPY (EGD);  Surgeon: Lamar ONEIDA Holmes, MD;  Location: [facility] ENDOSCOPY;  Service: Endoscopy;  Laterality: N/A;   ESOPHAGOGASTRODUODENOSCOPY (EGD) WITH PROPOFOL  N/A 09/27/2019   Procedure: ESOPHAGOGASTRODUODENOSCOPY (EGD) WITH PROPOFOL ;  Surgeon: [name], Ladell POUR, MD;  Location: [facility] ENDOSCOPY;  Service: Gastroenterology;  Laterality: N/A;   FLEXIBLE SIGMOIDOSCOPY N/A 09/15/2023   Procedure: FLEXIBLE SIGMOIDOSCOPY;  Surgeon: [name], Ladell POUR, MD;  Location: [facility] ENDOSCOPY;  Service: Gastroenterology;  Laterality: N/A;  DM   HERNIA REPAIR Bilateral mid-90s   x2  inguinal   IVC FILTER INSERTION N/A  03/06/2024   Procedure: IVC FILTER INSERTION;  Surgeon: Marea Selinda RAMAN, MD;  Location: [facility] INVASIVE CV LAB;  Service: Cardiovascular;  Laterality: N/A;   IVC FILTER REMOVAL N/A 05/08/2024   Procedure: IVC FILTER REMOVAL;  Surgeon: Marea Selinda RAMAN, MD;  Location: [facility] INVASIVE CV LAB;  Service: Cardiovascular;  Laterality: N/A;   PARTIAL COLECTOMY  1998   POLYPECTOMY  09/15/2023   Procedure: POLYPECTOMY;  Surgeon: [name], Ladell POUR, MD;  Location: [facility] ENDOSCOPY;  Service: Gastroenterology;;   PROSTATE BIOPSY  2013   SURGERY SCROTAL / TESTICULAR     for fibrous growth   TOTAL HIP ARTHROPLASTY Left 03/14/2024   Procedure: ARTHROPLASTY, HIP, TOTAL,POSTERIOR APPROACH;  Surgeon: Edie Norleen PARAS, MD;  Location: [facility] ORS;  Service: Orthopedics;  Laterality: Left;   Patient Active Problem List   Diagnosis Date Noted   Status post total hip replacement, left 03/14/2024   Diastolic dysfunction    Diabetes mellitus type 2 with complications (HCC) 03/23/2018   Chest pain with moderate risk for cardiac etiology 11/29/2017   Positive cardiac stress test 11/29/2017   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 11/08/2017   Esophagitis, reflux 11/08/2017   SOB (shortness of breath) on exertion 07/02/2017   BPH with obstruction/lower urinary tract symptoms 11/21/2015   History of elevated PSA 11/21/2015   H/O gastric ulcer 09/05/2015   Disorder of eyeball 05/23/2015   H/O infectious disease 05/23/2015   Blood glucose elevated 05/23/2015   BP (high blood pressure) 05/23/2015   HLD (hyperlipidemia) 05/23/2015   Psoriasis 05/23/2015   Absolute anemia 01/02/2014   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   Elevated prostate specific antigen (PSA) 01/02/2014   Elevated blood uric acid level 01/02/2014   Enlarged prostate without lower urinary tract symptoms (luts) 01/02/2014   Bleeding gastrointestinal 04/15/2012    PCP: Auston Reyes BIRCH, MD  REFERRING PROVIDER: Kip Lynwood Double, PA-C  REFERRING DIAG: s/p  Left THA   THERAPY DIAG:  Stiffness of left hip, not elsewhere classified  Pain in left hip  Difficulty in walking, not elsewhere classified  Muscle weakness (generalized)  Unsteadiness on feet  Other lack of coordination  Rationale for Evaluation and Treatment: rehabilitation   ONSET DATE: August 2025   SUBJECTIVE:   SUBJECTIVE STATEMENT: Pt reports doing better today, but 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    PERTINENT HISTORY: 83yoM referred to OPPT by orthopedics for ongoing difficulty walking, leg weakness, imbalance since Left THA in August 2025. Pt had a remarkable difficulty with LLE swelling since surgery. Pt saw HHPT 3x/week for 2 weeks. Pt has transitioned to [facility] for AMB outside of house or walker in house if he needs to  PAIN:  Are you having pain? None at rest    PRECAUTIONS: Posterior hip precautions   WEIGHT BEARING RESTRICTIONS: Yes, WBAT  FALLS:  Has patient fallen in last 6 months? No   LIVING ENVIRONMENT: Lives with: Wife  Lives in: House  Stairs: 4, 1 railing  Has following equipment at home: SPC, BSC, RW   OCCUPATION: retired   PATIENT GOALS: wants to be able to walk the dogs again without DME; wants to have less pain with car transfers.   OBJECTIVE:  Note: Objective measures were completed at Evaluation unless otherwise noted.  PATIENT SURVEYS:  LEFS: 47.5%  LOWER EXTREMITY MMT:  MMT Right eval Left eval  Hip flexion 5/5 5/5  Hip extension N/t N/t  Hip hor abduction 5/5 4+/5  Hip hor adduction 5/5 5/5  Hip internal rotation 5/5 5/5  Hip external rotation 5/5 4+/5*   (Blank rows = not tested)  LOWER EXTEMITY ROM:   Left hip extension ROM: -17deg Left knee extension: -20 deg  LOWER EXTREMITY SPECIAL TESTS:    FUNCTIONAL TESTS:  5x STS: 11.67sec, hands free, no pain   : 15.91sec SPC, RW: 13.15sec   : 443ft, 48m48s, pain over final  59ft left hip   GAIT: Distance walked: 426ft Assistive device utilized: [employer] - 2 wheeled Level of assistance: SBA Comments: pain in left hip in final 106ft  *abducted LLE, moresof with SPC use, 50% improved with RW                                                                                                                              TREATMENT DATE 07/25/2024 :  AA/ROM on Nustep Seat 9, arms 9, at level 2-5 (gray)   Performed In parallel bars:  Standing hip/trunk extension over pbar 2x 15 with 3 sec hold  Standing hip extension in partial lunge 2 x 30 sec bil UE support in parallel bars.   Side stepping in parallel bars x 5 laps. Cues for improved posture to maintain near neutral hip extension  and improve gluteal activation. Reports that he felt it in his back upon completion.   Standing hip extension to tap toe then straighten knee as much as possible while maintaining hip extension   Forward/reverse gait x 5 with emphasis on increased step length to allow improve hip extension and improved posture with reciprocal pattern.   Sit<>stand with lateral shift from PT over the LLE to force increased muscle activation and instruction to maintain symmetrical foot placement X 10.     PATIENT EDUCATION:  Education details: Daveion Person educated: update minisquats at home to full STS squats  Education method: collaborative learning, deliberate practice, positive reinforcement, explicit instruction, establish rules. Education comprehension: fair  HOME EXERCISE PROGRAM: Access Code: [email or web address] URL: [email or web address] Date: 07/18/2024 Prepared by: Peggye Linear  Exercises - Sit to Stand form HIGH surface  - 2 x daily - 1 sets - 10 reps - Marching at Counter with Minimal  Hand Support   - 2 x daily - 1 sets - 30 reps - Side Stepping with Counter Support  - 2 x daily - 1 sets - 20 reps - Supine Bridge  - 2 x daily - 1 sets - 10 reps - Supine March  - 2 x daily  - 1 sets - 20 reps - Standing Lumbar Extension with Counter  - 3 x daily - 7 x weekly - 1 sets - 25 reps  ASSESSMENT:  CLINICAL IMPRESSION: PT treatment continued to focus on hip extension limitations and improved gluteal activation. Pt reports that he has been performing hip extension over counter at home 10-15 reps at a time as 25 reps was bothering his back. Throughout session, pt was able to verify that he felt hip flexor stretch in appropriate region; was surprised how hard it is to attain full hip extension in standing with trunk extension. He would benefit from continuing PT treatment to improve on all of these deficits listed above.   OBJECTIVE IMPAIRMENTS: Decreased knowledge of condition, decreased use of DME, decreased mobility, difficulty walking, decreased strength, decreased ROM. ACTIVITY LIMITATIONS: Lifting, standing, walking, squatting, transfers, locomotion level PARTICIPATION LIMITATIONS: Cleaning, laundry, interpersonal relationships, driving, yardwork, community activity.  PERSONAL FACTORS: Age, behavior pattern, education, past/current experiences, transportation, profession  are also affecting patient's functional outcome.  REHAB POTENTIAL: Good CLINICAL DECISION MAKING: Medium  EVALUATION COMPLEXITY: Moderate    GOALS: Goals reviewed with patient? No  SHORT TERM GOALS: Target date: 07/28/24 MMT in BLE hips 5/5 pain free  Baseline: *see eval for detail, Left hip ER painful/weak  Goal status: INITIAL  2.  >949ft c RW to improve limited community distance moblity for independent exercise.  Baseline: 472ft c RW  Goal status: INITIAL  3.  Pt to demonstrate narrow stance on foam balance >30sec, eyes open to show improved Left hip proprioception/motor control.  Baseline: defer to visit 7  Goal status: INITIAL   LONG TERM GOALS: Target date: 08/28/24  Knowledge and confidence in a final DC HEP for conitnued improvements in strength and balance.  Baseline:   Goal status: INITIAL  2.  LEFS survey improvement >20% to indicated reduced disability.  Baseline: 47.5% Goal status: INITIAL  3.  c SPC >1.68m/s  Baseline: 0.34m/s c SPC Goal status: INITIAL  4.  >1576ft with DME PRN to improve energy conservation for community AMB.  Baseline: 470ft c RW  Goal status: INITIAL   PLAN:  PT FREQUENCY: 1-2x/week  PT DURATION: 12 weeks  PLANNED INTERVENTIONS: 97110-Therapeutic exercises, 97530- Therapeutic activity, [id number]- Neuromuscular re-education, 97535- Self Care, [id number]- Manual therapy, [id number]- Electrical stimulation (unattended), [phone]- Electrical stimulation (manual), Patient/Family education, Balance training, Stair training, Joint mobilization, Joint manipulation, DME instructions, Cryotherapy, and Moist heat  PLAN FOR NEXT SESSION:   extension ROM limitations, apply stretches for HEP as appropriate, increase gait training with RW, tandem balance to retrain leg position.      Massie Dollar PT, DPT  Physical Therapist - [facility]  11:49 AM 07/25/2024     "

## 2024-07-31 ENCOUNTER — Ambulatory Visit: Attending: Student

## 2024-07-31 DIAGNOSIS — R278 Other lack of coordination: Secondary | ICD-10-CM | POA: Diagnosis present

## 2024-07-31 DIAGNOSIS — R2681 Unsteadiness on feet: Secondary | ICD-10-CM | POA: Diagnosis present

## 2024-07-31 DIAGNOSIS — M6281 Muscle weakness (generalized): Secondary | ICD-10-CM | POA: Diagnosis present

## 2024-07-31 DIAGNOSIS — R262 Difficulty in walking, not elsewhere classified: Secondary | ICD-10-CM | POA: Diagnosis present

## 2024-07-31 DIAGNOSIS — M25652 Stiffness of left hip, not elsewhere classified: Secondary | ICD-10-CM | POA: Diagnosis present

## 2024-07-31 DIAGNOSIS — M25552 Pain in left hip: Secondary | ICD-10-CM | POA: Insufficient documentation

## 2024-07-31 NOTE — Therapy (Signed)
 " OUTPATIENT PHYSICAL THERAPY TREATMENT  Patient Name: Brian Callahan MRN: [id number] DOB:09/28/1940, 84 y.o., male Today's Date: 07/31/2024  END OF SESSION:  PT End of Session - 07/31/24 1053     Visit Number 8    Number of Visits 24    Date for Recertification  09/19/24    Authorization Type [employer] Medicare    Authorization Time Period 06/27/24-09/19/2024    Progress Note Due on Visit 10    PT Start Time 1059    PT Stop Time 1144    PT Time Calculation (min) 45 min    Equipment Utilized During Treatment Gait belt    Activity Tolerance Patient tolerated treatment well;No increased pain;Patient limited by fatigue    Behavior During Therapy [facility] for tasks assessed/performed           Past Medical History:  Diagnosis Date   Anemia    Aortic atherosclerosis    Aortic insufficiency    a. 06/2017 Echo: EF 50% with mild LVH, nl RV function, moderate AI, and mild MR/TR/PR.   Aortic root dilatation    Benign fibroma of prostate    BPH with obstruction/lower urinary tract symptoms    Cerebellar infarct (HCC)    Cerebral microvascular disease    Chest pain    a. 2010 Cath ([facility]): reportedly nl; b. 06/2017 [facility]): EF 63%, mild inf wall ischemia->med rx.   Chronic dyspnea    Chronic GI bleeding    Colon polyps    COPD (chronic obstructive pulmonary disease) (HCC)    Depression    Diaphragm paralysis (RIGHT)    Diastolic dysfunction    Diverticulosis    DM (diabetes mellitus), type 2 (HCC)    Elevated PSA    Embolus of mesenteric artery    Gastritis    GERD (gastroesophageal reflux disease)    Gout    H/O hernia repair mid-90s   x2   History of prostate cancer s/p IMRT    History of [place] spotted fever    HLD (hyperlipidemia)    HTN (hypertension)    Incomplete bladder emptying    Insomnia    a.) takes melatonin PRN   Long term current use of immunosuppressive drug    a.) IL-17A for psoriatic arthritis (secukinumab)   Macular degeneration    Obesity     Osteoarthritis of hip    Psoriasis    Psoriatic arthritis (HCC)    a.) on IL-17A inhibitor (secukinumab)   Pulmonary hypertension (HCC)    S/P insertion of IVC (inferior vena caval) filter    SBO (small bowel obstruction); s/p partial colectomy 1998   Status post bilateral cataract extraction    Urinary frequency    Past Surgical History:  Procedure Laterality Date   APPENDECTOMY     BACK SURGERY     neck and spine 2007   BIOPSY  09/15/2023   Procedure: BIOPSY;  Surgeon: Aundria, Ladell POUR, MD;  Location: [facility] ENDOSCOPY;  Service: Gastroenterology;;   CATARACT EXTRACTION, BILATERAL Bilateral    CERVICAL SPINE SURGERY  2007   CHOLECYSTECTOMY N/A 10/18/2017   Procedure: LAPAROSCOPIC CHOLECYSTECTOMY;  Surgeon: Rodolph Romano, MD;  Location: [facility] ORS;  Service: General;  Laterality: N/A;   COLONOSCOPY     lost 10 units of blood   COLONOSCOPY WITH PROPOFOL  N/A 09/27/2019   Procedure: COLONOSCOPY WITH PROPOFOL ;  Surgeon: [name], Ladell POUR, MD;  Location: [facility] ENDOSCOPY;  Service: Gastroenterology;  Laterality: N/A;   CYSTOSCOPY WITH INSERTION OF UROLIFT N/A 01/23/2020  Procedure: CYSTOSCOPY WITH INSERTION OF UROLIFT;  Surgeon: Twylla Glendia BROCKS, MD;  Location: [facility] ORS;  Service: Urology;  Laterality: N/A;   ESOPHAGOGASTRODUODENOSCOPY N/A 10/14/2016   Procedure: ESOPHAGOGASTRODUODENOSCOPY (EGD);  Surgeon: Lamar ONEIDA Holmes, MD;  Location[facility] ENDOSCOPY;  Service: Endoscopy;  Laterality: N/A;   ESOPHAGOGASTRODUODENOSCOPY (EGD) WITH PROPOFOL  N/A 09/27/2019   Procedure: ESOPHAGOGASTRODUODENOSCOPY (EGD) WITH PROPOFOL ;  Surgeon: [name], Ladell POUR, MD;  Location: [facility] ENDOSCOPY;  Service: Gastroenterology;  Laterality: N/A;   FLEXIBLE SIGMOIDOSCOPY N/A 09/15/2023   Procedure: FLEXIBLE SIGMOIDOSCOPY;  Surgeon: [name], Ladell POUR, MD;  Location: [facility] ENDOSCOPY;  Service: Gastroenterology;  Laterality: N/A;  DM   HERNIA REPAIR Bilateral mid-90s   x2  inguinal   IVC FILTER INSERTION N/A  03/06/2024   Procedure: IVC FILTER INSERTION;  Surgeon: Marea Selinda RAMAN, MD;  Location: [facility] INVASIVE CV LAB;  Service: Cardiovascular;  Laterality: N/A;   IVC FILTER REMOVAL N/A 05/08/2024   Procedure: IVC FILTER REMOVAL;  Surgeon: Marea Selinda RAMAN, MD;  Location: [facility] INVASIVE CV LAB;  Service: Cardiovascular;  Laterality: N/A;   PARTIAL COLECTOMY  1998   POLYPECTOMY  09/15/2023   Procedure: POLYPECTOMY;  Surgeon: [name], Ladell POUR, MD;  Location: [facility] ENDOSCOPY;  Service: Gastroenterology;;   PROSTATE BIOPSY  2013   SURGERY SCROTAL / TESTICULAR     for fibrous growth   TOTAL HIP ARTHROPLASTY Left 03/14/2024   Procedure: ARTHROPLASTY, HIP, TOTAL,POSTERIOR APPROACH;  Surgeon: Edie Norleen PARAS, MD;  Location: [facility] ORS;  Service: Orthopedics;  Laterality: Left;   Patient Active Problem List   Diagnosis Date Noted   Status post total hip replacement, left 03/14/2024   Diastolic dysfunction    Diabetes mellitus type 2 with complications (HCC) 03/23/2018   Chest pain with moderate risk for cardiac etiology 11/29/2017   Positive cardiac stress test 11/29/2017   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 11/08/2017   Esophagitis, reflux 11/08/2017   SOB (shortness of breath) on exertion 07/02/2017   BPH with obstruction/lower urinary tract symptoms 11/21/2015   History of elevated PSA 11/21/2015   H/O gastric ulcer 09/05/2015   Disorder of eyeball 05/23/2015   H/O infectious disease 05/23/2015   Blood glucose elevated 05/23/2015   BP (high blood pressure) 05/23/2015   HLD (hyperlipidemia) 05/23/2015   Psoriasis 05/23/2015   Absolute anemia 01/02/2014   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   Elevated prostate specific antigen (PSA) 01/02/2014   Elevated blood uric acid level 01/02/2014   Enlarged prostate without lower urinary tract symptoms (luts) 01/02/2014   Bleeding gastrointestinal 04/15/2012    PCP: Auston Reyes BIRCH, MD  REFERRING PROVIDER: Kip Lynwood Double, PA-C  REFERRING DIAG: s/p  Left THA   THERAPY DIAG:  Stiffness of left hip, not elsewhere classified  Pain in left hip  Difficulty in walking, not elsewhere classified  Muscle weakness (generalized)  Rationale for Evaluation and Treatment: rehabilitation   ONSET DATE: August 2025   SUBJECTIVE:   SUBJECTIVE STATEMENT: Patient is tired from having to walk to/from car twice due to forgetting phone in car which controls his hearing aids.   PERTINENT HISTORY: 83yoM referred to OPPT by orthopedics for ongoing difficulty walking, leg weakness, imbalance since Left THA in August 2025. Pt had a remarkable difficulty with LLE swelling since surgery. Pt saw HHPT 3x/week for 2 weeks. Pt has transitioned to [facility] for AMB outside of house or walker in house if he needs to  PAIN:  Are you having pain? None at rest    PRECAUTIONS: Posterior hip precautions  WEIGHT BEARING RESTRICTIONS: Yes, WBAT  FALLS:  Has patient fallen in last 6 months? No   LIVING ENVIRONMENT: Lives with: Wife  Lives in: House  Stairs: 4, 1 railing  Has following equipment at home: SPC, BSC, RW   OCCUPATION: retired   PATIENT GOALS: wants to be able to walk the dogs again without DME; wants to have less pain with car transfers.   OBJECTIVE:  Note: Objective measures were completed at Evaluation unless otherwise noted.  PATIENT SURVEYS:  LEFS: 47.5%  LOWER EXTREMITY MMT:  MMT Right eval Left eval  Hip flexion 5/5 5/5  Hip extension N/t N/t  Hip hor abduction 5/5 4+/5  Hip hor adduction 5/5 5/5  Hip internal rotation 5/5 5/5  Hip external rotation 5/5 4+/5*   (Blank rows = not tested)  LOWER EXTEMITY ROM:   Left hip extension ROM: -17deg Left knee extension: -20 deg  LOWER EXTREMITY SPECIAL TESTS:    FUNCTIONAL TESTS:  5x STS: 11.67sec, hands free, no pain   : 15.91sec SPC, RW: 13.15sec   : 496ft, 67m48s, pain over final 63ft left hip   GAIT: Distance walked: 422ft Assistive device utilized: Walker - 2  wheeled Level of assistance: SBA Comments: pain in left hip in final 32ft  *abducted LLE, moresof with SPC use, 50% improved with RW                                                                                                                              TREATMENT DATE 07/31/2024 :   Performed In parallel bars:  Standing hip/trunk extension  2x 15 with 3 sec hold ; cues for upright posture.  Standing hip extension in partial lunge 2 x 30 sec bil UE support in parallel bars.   Side stepping in parallel bars x 5 laps. Cues for improved posture to maintain near neutral hip extension  and improve gluteal activation.cue for keeping eye contact for upright posture   Standing hip extension to tap toe then straighten knee as much as possible while maintaining hip extension   Forward/reverse gait x 5 with emphasis on increased step length to allow improve hip extension and improved posture with reciprocal pattern.   Toy soldier 5x length of / /bars with cues for upright  Sit<>stand with lateral shift from PT over the LLE to force increased muscle activation and instruction to maintain symmetrical foot placement X 10.     PATIENT EDUCATION:  Education details: Khrystian Person educated: update minisquats at home to full STS squats  Education method: collaborative learning, deliberate practice, positive reinforcement, explicit instruction, establish rules. Education comprehension: fair  HOME EXERCISE PROGRAM: Access Code: [email or web address] URL: [email or web address] Date: 07/18/2024 Prepared by: Peggye Linear  Exercises - Sit to Stand form HIGH surface  - 2 x daily - 1 sets - 10 reps - Marching at Counter with Minimal Hand Support   - 2 x daily - 1 sets - 30 reps - Side  Stepping with Counter Support  - 2 x daily - 1 sets - 20 reps - Supine Bridge  - 2 x daily - 1 sets - 10 reps - Supine March  - 2 x daily - 1 sets - 20 reps - Standing Lumbar Extension with Counter  - 3 x daily - 7 x  weekly - 1 sets - 25 reps  ASSESSMENT:  CLINICAL IMPRESSION:  Patient presents to session fatigued from prolonged walk. Multiple cues for upright posture required throughout session. He requires multimodal cueing and is eager to progress his mobility. Some pain in back noted throughout the session with upright position. He would benefit from continuing PT treatment to improve on all of these deficits listed above.   OBJECTIVE IMPAIRMENTS: Decreased knowledge of condition, decreased use of DME, decreased mobility, difficulty walking, decreased strength, decreased ROM. ACTIVITY LIMITATIONS: Lifting, standing, walking, squatting, transfers, locomotion level PARTICIPATION LIMITATIONS: Cleaning, laundry, interpersonal relationships, driving, yardwork, community activity.  PERSONAL FACTORS: Age, behavior pattern, education, past/current experiences, transportation, profession  are also affecting patient's functional outcome.  REHAB POTENTIAL: Good CLINICAL DECISION MAKING: Medium  EVALUATION COMPLEXITY: Moderate    GOALS: Goals reviewed with patient? No  SHORT TERM GOALS: Target date: 07/28/24 MMT in BLE hips 5/5 pain free  Baseline: *see eval for detail, Left hip ER painful/weak  Goal status: INITIAL  2.  >974ft c RW to improve limited community distance moblity for independent exercise.  Baseline: 441ft c RW  Goal status: INITIAL  3.  Pt to demonstrate narrow stance on foam balance >30sec, eyes open to show improved Left hip proprioception/motor control.  Baseline: defer to visit 7  Goal status: INITIAL   LONG TERM GOALS: Target date: 08/28/24  Knowledge and confidence in a final DC HEP for conitnued improvements in strength and balance.  Baseline:  Goal status: INITIAL  2.  LEFS survey improvement >20% to indicated reduced disability.  Baseline: 47.5% Goal status: INITIAL  3.  c SPC >1.63m/s  Baseline: 0.52m/s c SPC Goal status: INITIAL  4.  >1554ft with DME  PRN to improve energy conservation for community AMB.  Baseline: 448ft c RW  Goal status: INITIAL   PLAN:  PT FREQUENCY: 1-2x/week  PT DURATION: 12 weeks  PLANNED INTERVENTIONS: 97110-Therapeutic exercises, 97530- Therapeutic activity, [id number]- Neuromuscular re-education, 97535- Self Care, [id number]- Manual therapy, [id number]- Electrical stimulation (unattended), [phone]- Electrical stimulation (manual), Patient/Family education, Balance training, Stair training, Joint mobilization, Joint manipulation, DME instructions, Cryotherapy, and Moist heat  PLAN FOR NEXT SESSION:   extension ROM limitations, apply stretches for HEP as appropriate, increase gait training with RW, tandem balance to retrain leg position.      Hydeia Mcatee  Leopoldo PT, DPT Physical Therapist - [facility]  Outpatient Physical Therapy- [facility] [phone]    11:46 AM 07/31/2024     "

## 2024-08-02 ENCOUNTER — Ambulatory Visit

## 2024-08-03 ENCOUNTER — Ambulatory Visit

## 2024-08-03 DIAGNOSIS — M25552 Pain in left hip: Secondary | ICD-10-CM

## 2024-08-03 DIAGNOSIS — M6281 Muscle weakness (generalized): Secondary | ICD-10-CM

## 2024-08-03 DIAGNOSIS — M25652 Stiffness of left hip, not elsewhere classified: Secondary | ICD-10-CM | POA: Diagnosis not present

## 2024-08-03 DIAGNOSIS — R2681 Unsteadiness on feet: Secondary | ICD-10-CM

## 2024-08-03 DIAGNOSIS — R262 Difficulty in walking, not elsewhere classified: Secondary | ICD-10-CM

## 2024-08-03 DIAGNOSIS — R278 Other lack of coordination: Secondary | ICD-10-CM

## 2024-08-03 NOTE — Therapy (Signed)
 " OUTPATIENT PHYSICAL THERAPY TREATMENT  Patient Name: Brian Callahan MRN: [id number] DOB:1941-07-05, 84 y.o., male Today's Date: 08/03/2024  END OF SESSION:  PT End of Session - 08/03/24 1207     Visit Number 9    Number of Visits 24    Date for Recertification  09/19/24    Authorization Type [employer]    Authorization Time Period 06/27/24-09/19/2024    Progress Note Due on Visit 10    PT Start Time 1403    PT Stop Time 1450    PT Time Calculation (min) 47 min    Equipment Utilized During Treatment Gait belt    Activity Tolerance Patient tolerated treatment well;No increased pain;Patient limited by fatigue    Behavior During Therapy [facility] for tasks assessed/performed           Past Medical History:  Diagnosis Date   Anemia    Aortic atherosclerosis    Aortic insufficiency    a. 06/2017 Echo: EF 50% with mild LVH, nl RV function, moderate AI, and mild MR/TR/PR.   Aortic root dilatation    Benign fibroma of prostate    BPH with obstruction/lower urinary tract symptoms    Cerebellar infarct (HCC)    Cerebral microvascular disease    Chest pain    a. 2010 Cath ([facility]): reportedly nl; b. 06/2017 [facility]): EF 63%, mild inf wall ischemia->med rx.   Chronic dyspnea    Chronic GI bleeding    Colon polyps    COPD (chronic obstructive pulmonary disease) (HCC)    Depression    Diaphragm paralysis (RIGHT)    Diastolic dysfunction    Diverticulosis    DM (diabetes mellitus), type 2 (HCC)    Elevated PSA    Embolus of mesenteric artery    Gastritis    GERD (gastroesophageal reflux disease)    Gout    H/O hernia repair mid-90s   x2   History of prostate cancer s/p IMRT    History of [place] spotted fever    HLD (hyperlipidemia)    HTN (hypertension)    Incomplete bladder emptying    Insomnia    a.) takes melatonin PRN   Long term current use of immunosuppressive drug    a.) IL-17A for psoriatic arthritis (secukinumab)   Macular degeneration    Obesity     Osteoarthritis of hip    Psoriasis    Psoriatic arthritis (HCC)    a.) on IL-17A inhibitor (secukinumab)   Pulmonary hypertension (HCC)    S/P insertion of IVC (inferior vena caval) filter    SBO (small bowel obstruction); s/p partial colectomy 1998   Status post bilateral cataract extraction    Urinary frequency    Past Surgical History:  Procedure Laterality Date   APPENDECTOMY     BACK SURGERY     neck and spine 2007   BIOPSY  09/15/2023   Procedure: BIOPSY;  Surgeon: Aundria, Ladell POUR, MD;  Location: [facility] ENDOSCOPY;  Service: Gastroenterology;;   CATARACT EXTRACTION, BILATERAL Bilateral    CERVICAL SPINE SURGERY  2007   CHOLECYSTECTOMY N/A 10/18/2017   Procedure: LAPAROSCOPIC CHOLECYSTECTOMY;  Surgeon: Rodolph Romano, MD;  Location: [facility] ORS;  Service: General;  Laterality: N/A;   COLONOSCOPY     lost 10 units of blood   COLONOSCOPY WITH PROPOFOL  N/A 09/27/2019   Procedure: COLONOSCOPY WITH PROPOFOL ;  Surgeon: [name], Ladell POUR, MD;  Location: [facility] ENDOSCOPY;  Service: Gastroenterology;  Laterality: N/A;   CYSTOSCOPY WITH INSERTION OF UROLIFT N/A 01/23/2020  Procedure: CYSTOSCOPY WITH INSERTION OF UROLIFT;  Surgeon: Twylla Glendia BROCKS, MD;  Location: [facility] ORS;  Service: Urology;  Laterality: N/A;   ESOPHAGOGASTRODUODENOSCOPY N/A 10/14/2016   Procedure: ESOPHAGOGASTRODUODENOSCOPY (EGD);  Surgeon: Lamar ONEIDA Holmes, MD;  Location: [facility] END[facility]COPY;  Service: Endoscopy;  Laterality: N/A;   ESOPHAGOGASTRODUODENOSCOPY (EGD) WITH PROPOFOL  N/A 09/27/2019   Procedure: ESOPHAGOGASTRODUODENOSCOPY (EGD) WITH PROPOFOL ;  Surgeon: [name], Ladell POUR, MD;  Location: [facility] ENDOSCOPY;  Service: Gastroenterology;  Laterality: N/A;   FLEXIBLE SIGMOIDOSCOPY N/A 09/15/2023   Procedure: FLEXIBLE SIGMOIDOSCOPY;  Surgeon: [name], Ladell POUR, MD;  Location: [facility] ENDOSCOPY;  Service: Gastroenterology;  Laterality: N/A;  DM   HERNIA REPAIR Bilateral mid-90s   x2  inguinal   IVC FILTER INSERTION N/A  03/06/2024   Procedure: IVC FILTER INSERTION;  Surgeon: Marea Selinda RAMAN, MD;  Location: [facility] INVASIVE CV LAB;  Service: Cardiovascular;  Laterality: N/A;   IVC FILTER REMOVAL N/A 05/08/2024   Procedure: IVC FILTER REMOVAL;  Surgeon: Marea Selinda RAMAN, MD;  Location: [facility] INVASIVE CV LAB;  Service: Cardiovascular;  Laterality: N/A;   PARTIAL COLECTOMY  1998   POLYPECTOMY  09/15/2023   Procedure: POLYPECTOMY;  Surgeon: [name], Ladell POUR, MD;  Location: [facility] ENDOSCOPY;  Service: Gastroenterology;;   PROSTATE BIOPSY  2013   SURGERY SCROTAL / TESTICULAR     for fibrous growth   TOTAL HIP ARTHROPLASTY Left 03/14/2024   Procedure: ARTHROPLASTY, HIP, TOTAL,POSTERIOR APPROACH;  Surgeon: Edie Norleen PARAS, MD;  Location: [facility] ORS;  Service: Orthopedics;  Laterality: Left;   Patient Active Problem List   Diagnosis Date Noted   Status post total hip replacement, left 03/14/2024   Diastolic dysfunction    Diabetes mellitus type 2 with complications (HCC) 03/23/2018   Chest pain with moderate risk for cardiac etiology 11/29/2017   Positive cardiac stress test 11/29/2017   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 11/08/2017   Esophagitis, reflux 11/08/2017   SOB (shortness of breath) on exertion 07/02/2017   BPH with obstruction/lower urinary tract symptoms 11/21/2015   History of elevated PSA 11/21/2015   H/O gastric ulcer 09/05/2015   Disorder of eyeball 05/23/2015   H/O infectious disease 05/23/2015   Blood glucose elevated 05/23/2015   BP (high blood pressure) 05/23/2015   HLD (hyperlipidemia) 05/23/2015   Psoriasis 05/23/2015   Absolute anemia 01/02/2014   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   Elevated prostate specific antigen (PSA) 01/02/2014   Elevated blood uric acid level 01/02/2014   Enlarged prostate without lower urinary tract symptoms (luts) 01/02/2014   Bleeding gastrointestinal 04/15/2012    PCP: Auston Reyes BIRCH, MD  REFERRING PROVIDER: Auston Reyes BIRCH, MD  REFERRING DIAG: s/p Left  THA   THERAPY DIAG:  Stiffness of left hip, not elsewhere classified  Pain in left hip  Difficulty in walking, not elsewhere classified  Muscle weakness (generalized)  Unsteadiness on feet  Other lack of coordination  Rationale for Evaluation and Treatment: rehabilitation   ONSET DATE: August 2025   SUBJECTIVE:   SUBJECTIVE STATEMENT: Patient reports he has not been up to much today. He reports the L hip is feeling okay.   PERTINENT HISTORY: 83yoM referred to OPPT by orthopedics for ongoing difficulty walking, leg weakness, imbalance since Left THA in August 2025. Pt had a remarkable difficulty with LLE swelling since surgery. Pt saw HHPT 3x/week for 2 weeks. Pt has transitioned to [facility] for AMB outside of house or walker in house if he needs to  PAIN:  Are you having pain? None at rest  PRECAUTIONS: Posterior hip precautions   WEIGHT BEARING RESTRICTIONS: Yes, WBAT  FALLS:  Has patient fallen in last 6 months? No   LIVING ENVIRONMENT: Lives with: Wife  Lives in: House  Stairs: 4, 1 railing  Has following equipment at home: SPC, BSC, RW   OCCUPATION: retired   PATIENT GOALS: wants to be able to walk the dogs again without DME; wants to have less pain with car transfers.   OBJECTIVE:  Note: Objective measures were completed at Evaluation unless otherwise noted.  PATIENT SURVEYS:  LEFS: 47.5%  LOWER EXTREMITY MMT:  MMT Right eval Left eval  Hip flexion 5/5 5/5  Hip extension N/t N/t  Hip hor abduction 5/5 4+/5  Hip hor adduction 5/5 5/5  Hip internal rotation 5/5 5/5  Hip external rotation 5/5 4+/5*   (Blank rows = not tested)  LOWER EXTEMITY ROM:   Left hip extension ROM: -17deg Left knee extension: -20 deg  LOWER EXTREMITY SPECIAL TESTS:    FUNCTIONAL TESTS:  5x STS: 11.67sec, hands free, no pain   : 15.91sec SPC, RW: 13.15sec   : 463ft, 15m48s, pain over final 18ft left hip   GAIT: Distance walked: 423ft Assistive device  utilized: Walker - 2 wheeled Level of assistance: SBA Comments: pain in left hip in final 34ft  *abducted LLE, moresof with SPC use, 50% improved with RW                                                                                                                              TREATMENT DATE 08/03/2024 :  Side stepping in parallel bars x 5 laps. Cues for improved posture to maintain near neutral hip extension  and improve gluteal activation.cue for keeping eye contact for upright posture    Sit<>stand without UE use 2x10 focus on weight shift to L.   -bilateral hip extension 2x10 each.   -Tandem stance at balance station: 30''x2.   -Gait 450' with rollator with cuing for safety with gait and upright posture. (Patient reported increased low back pain when standing up straight).  -Step-ups onto 6'' step: 15x each.  -Side-stepping at balance station: 10 laps.   -Gait with rollator 300' CGA for safety.   PATIENT EDUCATION:  Education details: Jeffie Person educated: update minisquats at home to full STS squats  Education method: collaborative learning, deliberate practice, positive reinforcement, explicit instruction, establish rules. Education comprehension: fair  HOME EXERCISE PROGRAM: Access Code: [email or web address] URL: [email or web address] Date: 07/18/2024 Prepared by: Peggye Linear  Exercises - Sit to Stand form HIGH surface  - 2 x daily - 1 sets - 10 reps - Marching at Counter with Minimal Hand Support   - 2 x daily - 1 sets - 30 reps - Side Stepping with Counter Support  - 2 x daily - 1 sets - 20 reps - Supine Bridge  - 2 x daily - 1 sets - 10 reps - Supine March  - 2 x daily -  1 sets - 20 reps - Standing Lumbar Extension with Counter  - 3 x daily - 7 x weekly - 1 sets - 25 reps  ASSESSMENT:  CLINICAL IMPRESSION:  Patient arrived today using SPC for ambulation. He is still noted to walk with forward flexed posture at the hips therefore focused part of today's  session on improving posture with gait as he walked with rollator. Patient did respond well with cues for upright posture, but reported it did increase low back pain. Patient also performed strengthening exercises targeting the L hip. Significant L hip weakness noted with step-ups using L LE compared to R. Overall, patient continues to be motivated to improve and responds positively to treatment session reporting that he feels better post-tx. Patient is a good candidate for continuing skilled PT at this time for further improvements toward goals.   OBJECTIVE IMPAIRMENTS: Decreased knowledge of condition, decreased use of DME, decreased mobility, difficulty walking, decreased strength, decreased ROM. ACTIVITY LIMITATIONS: Lifting, standing, walking, squatting, transfers, locomotion level PARTICIPATION LIMITATIONS: Cleaning, laundry, interpersonal relationships, driving, yardwork, community activity.  PERSONAL FACTORS: Age, behavior pattern, education, past/current experiences, transportation, profession  are also affecting patient's functional outcome.  REHAB POTENTIAL: Good CLINICAL DECISION MAKING: Medium  EVALUATION COMPLEXITY: Moderate    GOALS: Goals reviewed with patient? No  SHORT TERM GOALS: Target date: 07/28/24 MMT in BLE hips 5/5 pain free  Baseline: *see eval for detail, Left hip ER painful/weak  Goal status: INITIAL  2.  >926ft c RW to improve limited community distance moblity for independent exercise.  Baseline: 445ft c RW  Goal status: INITIAL  3.  Pt to demonstrate narrow stance on foam balance >30sec, eyes open to show improved Left hip proprioception/motor control.  Baseline: defer to visit 7  Goal status: INITIAL   LONG TERM GOALS: Target date: 08/28/24  Knowledge and confidence in a final DC HEP for conitnued improvements in strength and balance.  Baseline:  Goal status: INITIAL  2.  LEFS survey improvement >20% to indicated reduced disability.  Baseline:  47.5% Goal status: INITIAL  3.  c SPC >1.68m/s  Baseline: 0.39m/s c SPC Goal status: INITIAL  4.  >1576ft with DME PRN to improve energy conservation for community AMB.  Baseline: 475ft c RW  Goal status: INITIAL   PLAN:  PT FREQUENCY: 1-2x/week  PT DURATION: 12 weeks  PLANNED INTERVENTIONS: 97110-Therapeutic exercises, 97530- Therapeutic activity, 97112- Neuromuscular re-education, 97535- Self Care, [id number]- Manual therapy, [id number]- Electrical stimulation (unattended), [id number]- Electrical stimulation (manual), Patient/Family education, Balance training, Stair training, Joint mobilization, Joint manipulation, DME instructions, Cryotherapy, and Moist heat  PLAN FOR NEXT SESSION:   Continue working on tourist information centre manager with AD.  Continue working to improve L hip strength with various progressive resistive/functional exercises.  Norman Sharps, PT, DPT Physical Therapist - [facility]  [phone]    3:00 PM 08/03/2024     "

## 2024-08-08 ENCOUNTER — Ambulatory Visit

## 2024-08-08 DIAGNOSIS — R262 Difficulty in walking, not elsewhere classified: Secondary | ICD-10-CM

## 2024-08-08 DIAGNOSIS — M25552 Pain in left hip: Secondary | ICD-10-CM

## 2024-08-08 DIAGNOSIS — M6281 Muscle weakness (generalized): Secondary | ICD-10-CM

## 2024-08-08 DIAGNOSIS — R278 Other lack of coordination: Secondary | ICD-10-CM

## 2024-08-08 DIAGNOSIS — R2681 Unsteadiness on feet: Secondary | ICD-10-CM

## 2024-08-08 DIAGNOSIS — M25652 Stiffness of left hip, not elsewhere classified: Secondary | ICD-10-CM

## 2024-08-08 NOTE — Therapy (Signed)
 " OUTPATIENT PHYSICAL THERAPY TREATMENT Physical Therapy Progress Note   Dates of reporting period  06/27/2024   to   08/08/2024   Patient Name: Brian Callahan MRN: [id number] DOB:12-23-1940, 84 y.o., male Today's Date: 08/08/2024  END OF SESSION:  PT End of Session - 08/08/24 0835     Visit Number 10    Number of Visits 24    Date for Recertification  09/19/24    Authorization Type [employer] Medicare    Authorization Time Period 06/27/24-09/19/2024    Progress Note Due on Visit 10    PT Start Time 0845    PT Stop Time 0928    PT Time Calculation (min) 43 min    Equipment Utilized During Treatment Gait belt    Activity Tolerance Patient tolerated treatment well;No increased pain;Patient limited by fatigue    Behavior During Therapy [facility] for tasks assessed/performed            Past Medical History:  Diagnosis Date   Anemia    Aortic atherosclerosis    Aortic insufficiency    a. 06/2017 Echo: EF 50% with mild LVH, nl RV function, moderate AI, and mild MR/TR/PR.   Aortic root dilatation    Benign fibroma of prostate    BPH with obstruction/lower urinary tract symptoms    Cerebellar infarct (HCC)    Cerebral microvascular disease    Chest pain    a. 2010 Cath ([facility]): reportedly nl; b. 06/2017 [facility]): EF 63%, mild inf wall ischemia->med rx.   Chronic dyspnea    Chronic GI bleeding    Colon polyps    COPD (chronic obstructive pulmonary disease) (HCC)    Depression    Diaphragm paralysis (RIGHT)    Diastolic dysfunction    Diverticulosis    DM (diabetes mellitus), type 2 (HCC)    Elevated PSA    Embolus of mesenteric artery    Gastritis    GERD (gastroesophageal reflux disease)    Gout    H/O hernia repair mid-90s   x2   History of prostate cancer s/p IMRT    History of [place] spotted fever    HLD (hyperlipidemia)    HTN (hypertension)    Incomplete bladder emptying    Insomnia    a.) takes melatonin PRN   Long term current use of immunosuppressive  drug    a.) IL-17A for psoriatic arthritis (secukinumab)   Macular degeneration    Obesity    Osteoarthritis of hip    Psoriasis    Psoriatic arthritis (HCC)    a.) on IL-17A inhibitor (secukinumab)   Pulmonary hypertension (HCC)    S/P insertion of IVC (inferior vena caval) filter    SBO (small bowel obstruction); s/p partial colectomy 1998   Status post bilateral cataract extraction    Urinary frequency    Past Surgical History:  Procedure Laterality Date   APPENDECTOMY     BACK SURGERY     neck and spine 2007   BIOPSY  09/15/2023   Procedure: BIOPSY;  Surgeon: Aundria, Ladell POUR, MD;  Location: [facility] ENDOSCOPY;  Service: Gastroenterology;;   CATARACT EXTRACTION, BILATERAL Bilateral    CERVICAL SPINE SURGERY  2007   CHOLECYSTECTOMY N/A 10/18/2017   Procedure: LAPAROSCOPIC CHOLECYSTECTOMY;  Surgeon: Rodolph Romano, MD;  Location: [facility] ORS;  Service: General;  Laterality: N/A;   COLONOSCOPY     lost 10 units of blood   COLONOSCOPY WITH PROPOFOL  N/A 09/27/2019   Procedure: COLONOSCOPY WITH PROPOFOL ;  Surgeon: Lutz, Teodoro K,  MD;  Location: [facility] ENDOSCOPY;  Service: Gastroenterology;  Laterality: N/A;   CYSTOSCOPY WITH INSERTION OF UROLIFT N/A 01/23/2020   Procedure: CYSTOSCOPY WITH INSERTION OF UROLIFT;  Surgeon: Twylla Glendia BROCKS, MD;  Location: [facility] ORS;  Service: Urology;  Laterality: N/A;   ESOPHAGOGASTRODUODENOSCOPY N/A 10/14/2016   Procedure: ESOPHAGOGASTRODUODENOSCOPY (EGD);  Surgeon: Lamar ONEIDA Holmes, MD;  Location: [facility] ENDOSCOPY;  Service: Endoscopy;  Laterality: N/A;   ESOPHAGOGASTRODUODENOSCOPY (EGD) WITH PROPOFOL  N/A 09/27/2019   Procedure: ESOPHAGOGASTRODUODENOSCOPY (EGD) WITH PROPOFOL ;  Surgeon: [name], Ladell POUR, MD;  Location: [facility] ENDOSCOPY;  Service: Gastroenterology;  Laterality: N/A;   FLEXIBLE SIGMOIDOSCOPY N/A 09/15/2023   Procedure: FLEXIBLE SIGMOIDOSCOPY;  Surgeon: [name], Ladell POUR, MD;  Location: [facility] ENDOSCOPY;  Service: Gastroenterology;   Laterality: N/A;  DM   HERNIA REPAIR Bilateral mid-90s   x2  inguinal   IVC FILTER INSERTION N/A 03/06/2024   Procedure: IVC FILTER INSERTION;  Surgeon: Marea Selinda RAMAN, MD;  Location: [facility] INVASIVE CV LAB;  Service: Cardiovascular;  Laterality: N/A;   IVC FILTER REMOVAL N/A 05/08/2024   Procedure: IVC FILTER REMOVAL;  Surgeon: Marea Selinda RAMAN, MD;  Location: [facility] INVASIVE CV LAB;  Service: Cardiovascular;  Laterality: N/A;   PARTIAL COLECTOMY  1998   POLYPECTOMY  09/15/2023   Procedure: POLYPECTOMY;  Surgeon: [name], Ladell POUR, MD;  Location: [facility] ENDOSCOPY;  Service: Gastroenterology;;   PROSTATE BIOPSY  2013   SURGERY SCROTAL / TESTICULAR     for fibrous growth   TOTAL HIP ARTHROPLASTY Left 03/14/2024   Procedure: ARTHROPLASTY, HIP, TOTAL,POSTERIOR APPROACH;  Surgeon: Edie Norleen PARAS, MD;  Location: [facility] ORS;  Service: Orthopedics;  Laterality: Left;   Patient Active Problem List   Diagnosis Date Noted   Status post total hip replacement, left 03/14/2024   Diastolic dysfunction    Diabetes mellitus type 2 with complications (HCC) 03/23/2018   Chest pain with moderate risk for cardiac etiology 11/29/2017   Positive cardiac stress test 11/29/2017   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 11/08/2017   Esophagitis, reflux 11/08/2017   SOB (shortness of breath) on exertion 07/02/2017   BPH with obstruction/lower urinary tract symptoms 11/21/2015   History of elevated PSA 11/21/2015   H/O gastric ulcer 09/05/2015   Disorder of eyeball 05/23/2015   H/O infectious disease 05/23/2015   Blood glucose elevated 05/23/2015   BP (high blood pressure) 05/23/2015   HLD (hyperlipidemia) 05/23/2015   Psoriasis 05/23/2015   Absolute anemia 01/02/2014   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   Elevated prostate specific antigen (PSA) 01/02/2014   Elevated blood uric acid level 01/02/2014   Enlarged prostate without lower urinary tract symptoms (luts) 01/02/2014   Bleeding gastrointestinal 04/15/2012     PCP: Auston Reyes BIRCH, MD  REFERRING PROVIDER: Kip Lynwood Double, PA-C  REFERRING DIAG: s/p Left THA   THERAPY DIAG:  Stiffness of left hip, not elsewhere classified  Pain in left hip  Difficulty in walking, not elsewhere classified  Muscle weakness (generalized)  Unsteadiness on feet  Other lack of coordination  Rationale for Evaluation and Treatment: rehabilitation   ONSET DATE: August 2025   SUBJECTIVE:   SUBJECTIVE STATEMENT: Patient reports doing well today with no significant changes.   PERTINENT HISTORY: 83yoM referred to OPPT by orthopedics for ongoing difficulty walking, leg weakness, imbalance since Left THA in August 2025. Pt had a remarkable difficulty with LLE swelling since surgery. Pt saw HHPT 3x/week for 2 weeks. Pt has transitioned to [facility] for AMB outside of house or walker in house if he needs  to  PAIN:  Are you having pain? None at rest    PRECAUTIONS: Posterior hip precautions   WEIGHT BEARING RESTRICTIONS: Yes, WBAT  FALLS:  Has patient fallen in last 6 months? No   LIVING ENVIRONMENT: Lives with: Wife  Lives in: House  Stairs: 4, 1 railing  Has following equipment at home: SPC, BSC, RW   OCCUPATION: retired   PATIENT GOALS: wants to be able to walk the dogs again without DME; wants to have less pain with car transfers.   OBJECTIVE:  Note: Objective measures were completed at Evaluation unless otherwise noted.  PATIENT SURVEYS:  LEFS: 47.5%  LOWER EXTREMITY MMT:  MMT Right eval Left eval  Hip flexion 5/5 5/5  Hip extension N/t N/t  Hip hor abduction 5/5 4+/5  Hip hor adduction 5/5 5/5  Hip internal rotation 5/5 5/5  Hip external rotation 5/5 4+/5*   (Blank rows = not tested)  LOWER EXTEMITY ROM:   Left hip extension ROM: -17deg Left knee extension: -20 deg  LOWER EXTREMITY SPECIAL TESTS:    FUNCTIONAL TESTS:  5x STS: 11.67sec, hands free, no pain   : 15.91sec SPC, RW: 13.15sec   : 467ft, 81m48s, pain  over final 44ft left hip   GAIT: Distance walked: 443ft Assistive device utilized: Environmental Consultant - 2 wheeled Level of assistance: SBA Comments: pain in left hip in final 65ft  *abducted LLE, moresof with SPC use, 50% improved with RW                                                                                                                              TREATMENT DATE 08/08/2024 :  Progress note performed today: See goals and assessment sections.   --------------------------------------------------------------- Previous treatment:    Side stepping in parallel bars x 5 laps. Cues for improved posture to maintain near neutral hip extension  and improve gluteal activation.cue for keeping eye contact for upright posture    Sit<>stand without UE use 2x10 focus on weight shift to L.   -bilateral hip extension 2x10 each.   -Tandem stance at balance station: 30''x2.   -Gait 450' with rollator with cuing for safety with gait and upright posture. (Patient reported increased low back pain when standing up straight).  -Step-ups onto 6'' step: 15x each.  -Side-stepping at balance station: 10 laps.   -Gait with rollator 300' CGA for safety.   PATIENT EDUCATION:  Education details: Neilson Person educated: update minisquats at home to full STS squats  Education method: collaborative learning, deliberate practice, positive reinforcement, explicit instruction, establish rules. Education comprehension: fair  HOME EXERCISE PROGRAM: Access Code: [email or web address] URL: [email or web address] Date: 07/18/2024 Prepared by: Peggye Linear  Exercises - Sit to Stand form HIGH surface  - 2 x daily - 1 sets - 10 reps - Marching at Counter with Minimal Hand Support   - 2 x daily - 1 sets - 30 reps - Side Stepping with Counter Support  - 2  x daily - 1 sets - 20 reps - Supine Bridge  - 2 x daily - 1 sets - 10 reps - Supine March  - 2 x daily - 1 sets - 20 reps - Standing Lumbar Extension with  Counter  - 3 x daily - 7 x weekly - 1 sets - 25 reps  ASSESSMENT:  CLINICAL IMPRESSION:  Today was patient's 10th visit and a progress note was taken.   Subjectively, patient reports that his walking has improved. He reports that it is now less stressful  to walk than it was before and that he walks better when out in space. He also reports having less pain when walking compared to before. He also reports that getting into/out of bed is less difficult than before. He does report that he continues to have difficulty getting into/out of car. He wishes to begin going to gym again 2x/week once he is finished with PT POC.   Objectively, patient was noted to have increased gait speed during 10 MWT progressing toward that goal. He also made significant improvement in gait efficiency during 6 MWT doubling his distance today compared to evaluation. Patient did not make improvement on LEFS outcome measure today compared to evaluation.   Patient has made great progress throughout previous 10 treatment sessions showing improved gait speed, improved gait efficiency, and decreased difficulty with overll mobility. He is a good candidate for continuing skilled PT at this time for further improvements toward goals.   OBJECTIVE IMPAIRMENTS: Decreased knowledge of condition, decreased use of DME, decreased mobility, difficulty walking, decreased strength, decreased ROM. ACTIVITY LIMITATIONS: Lifting, standing, walking, squatting, transfers, locomotion level PARTICIPATION LIMITATIONS: Cleaning, laundry, interpersonal relationships, driving, yardwork, community activity.  PERSONAL FACTORS: Age, behavior pattern, education, past/current experiences, transportation, profession  are also affecting patient's functional outcome.  REHAB POTENTIAL: Good CLINICAL DECISION MAKING: Medium  EVALUATION COMPLEXITY: Moderate    GOALS: Goals reviewed with patient? No  SHORT TERM GOALS: Target date: 07/28/24 MMT in BLE hips 5/5  pain free  Baseline: *see eval for detail, Left hip ER painful/weak  Goal status: INITIAL  2.  >957ft c RW to improve limited community distance moblity for independent exercise.  Baseline: 43ft c RW 08/08/2024: 975' with RW  Goal status: Met.  3.  Pt to demonstrate narrow stance on foam balance >30sec, eyes open to show improved Left hip proprioception/motor control.  Baseline: defer to visit 7  Goal status: INITIAL   LONG TERM GOALS: Target date: 08/28/24  Knowledge and confidence in a final DC HEP for conitnued improvements in strength and balance.  Baseline:  Goal status: INITIAL  2.  LEFS survey improvement >20% to indicated reduced disability.  Baseline: 47.5% 08/08/2024: 45% Goal status: INITIAL  3.  c SPC >1.35m/s  Baseline: 0.69m/s c SPC 08/08/2024: 0.74 m/s c SPC  Goal status: INITIAL  4.  >1524ft with DME PRN to improve energy conservation for community AMB.  Baseline: 448ft c RW  08/08/2024: 975' c RW.  Goal status: INITIAL   PLAN:  PT FREQUENCY: 1-2x/week  PT DURATION: 12 weeks  PLANNED INTERVENTIONS: 97110-Therapeutic exercises, 97530- Therapeutic activity, 97112- Neuromuscular re-education, 97535- Self Care, [id number]- Manual therapy, [id number]- Electrical stimulation (unattended), [phone]- Electrical stimulation (manual), Patient/Family education, Balance training, Stair training, Joint mobilization, Joint manipulation, DME instructions, Cryotherapy, and Moist heat  PLAN FOR NEXT SESSION:   Continue working to improve gait speed/distance. Continue working on tourist information centre manager with AD.  Continue working to improve L hip strength  with various progressive resistive/functional exercises.  Norman Sharps, PT, DPT Physical Therapist - [facility]  [phone]    9:31 AM 08/08/2024     "

## 2024-08-09 ENCOUNTER — Other Ambulatory Visit: Payer: Self-pay | Admitting: Student

## 2024-08-09 DIAGNOSIS — M1712 Unilateral primary osteoarthritis, left knee: Secondary | ICD-10-CM

## 2024-08-10 ENCOUNTER — Ambulatory Visit

## 2024-08-10 DIAGNOSIS — M25652 Stiffness of left hip, not elsewhere classified: Secondary | ICD-10-CM

## 2024-08-10 DIAGNOSIS — R278 Other lack of coordination: Secondary | ICD-10-CM

## 2024-08-10 DIAGNOSIS — M25552 Pain in left hip: Secondary | ICD-10-CM

## 2024-08-10 DIAGNOSIS — R2681 Unsteadiness on feet: Secondary | ICD-10-CM

## 2024-08-10 DIAGNOSIS — R262 Difficulty in walking, not elsewhere classified: Secondary | ICD-10-CM

## 2024-08-10 DIAGNOSIS — M6281 Muscle weakness (generalized): Secondary | ICD-10-CM

## 2024-08-10 NOTE — Therapy (Signed)
 " OUTPATIENT PHYSICAL THERAPY TREATMENT   Patient Name: Brian Callahan MRN: [id number] DOB:[birth year], 84 y.o., male Today's Date: 08/10/2024  END OF SESSION:  PT End of Session - 08/10/24 1532     Visit Number 11    Number of Visits 24    Date for Recertification  09/19/24    Authorization Type [employer] Medicare    Authorization Time Period 06/27/24-09/19/2024    Progress Note Due on Visit 10    PT Start Time 1532    PT Stop Time 1613    PT Time Calculation (min) 41 min    Equipment Utilized During Treatment Gait belt    Activity Tolerance Patient tolerated treatment well;No increased pain;Patient limited by fatigue    Behavior During Therapy [facility] for tasks assessed/performed            Past Medical History:  Diagnosis Date   Anemia    Aortic atherosclerosis    Aortic insufficiency    a. 06/2017 Echo: EF 50% with mild LVH, nl RV function, moderate AI, and mild MR/TR/PR.   Aortic root dilatation    Benign fibroma of prostate    BPH with obstruction/lower urinary tract symptoms    Cerebellar infarct (HCC)    Cerebral microvascular disease    Chest pain    a. 2010 Cath ([facility]): reportedly nl; b. 06/2017 [facility]): EF 63%, mild inf wall ischemia->med rx.   Chronic dyspnea    Chronic GI bleeding    Colon polyps    COPD (chronic obstructive pulmonary disease) (HCC)    Depression    Diaphragm paralysis (RIGHT)    Diastolic dysfunction    Diverticulosis    DM (diabetes mellitus), type 2 (HCC)    Elevated PSA    Embolus of mesenteric artery    Gastritis    GERD (gastroesophageal reflux disease)    Gout    H/O hernia repair mid-90s   x2   History of prostate cancer s/p IMRT    History of [place] spotted fever    HLD (hyperlipidemia)    HTN (hypertension)    Incomplete bladder emptying    Insomnia    a.) takes melatonin PRN   Long term current use of immunosuppressive drug    a.) IL-17A for psoriatic arthritis (secukinumab)   Macular degeneration    Obesity     Osteoarthritis of hip    Psoriasis    Psoriatic arthritis (HCC)    a.) on IL-17A inhibitor (secukinumab)   Pulmonary hypertension (HCC)    S/P insertion of IVC (inferior vena caval) filter    SBO (small bowel obstruction); s/p partial colectomy 1998   Status post bilateral cataract extraction    Urinary frequency    Past Surgical History:  Procedure Laterality Date   APPENDECTOMY     BACK SURGERY     neck and spine 2007   BIOPSY  09/15/2023   Procedure: BIOPSY;  Surgeon: Aundria, Ladell POUR, MD;  Location: [facility] ENDOSCOPY;  Service: Gastroenterology;;   CATARACT EXTRACTION, BILATERAL Bilateral    CERVICAL SPINE SURGERY  2007   CHOLECYSTECTOMY N/A 10/18/2017   Procedure: LAPAROSCOPIC CHOLECYSTECTOMY;  Surgeon: Rodolph Romano, MD;  Location: [facility] ORS;  Service: General;  Laterality: N/A;   COLONOSCOPY     lost 10 units of blood   COLONOSCOPY WITH PROPOFOL  N/A 09/27/2019   Procedure: COLONOSCOPY WITH PROPOFOL ;  Surgeon: [name], Ladell POUR, MD;  Location: [facility] ENDOSCOPY;  Service: Gastroenterology;  Laterality: N/A;   CYSTOSCOPY WITH INSERTION OF UROLIFT  N/A 01/23/2020   Procedure: CYSTOSCOPY WITH INSERTION OF UROLIFT;  Surgeon: Twylla Glendia BROCKS, MD;  Location: [facility] ORS;  Service: Urology;  Laterality: N/A;   ESOPHAGOGASTRODUODENOSCOPY N/A 10/14/2016   Procedure: ESOPHAGOGASTRODUODENOSCOPY (EGD);  Surgeon: Lamar ONEIDA Holmes, MD;  Location: [facility] ENDOSCOPY;  Service: Endoscopy;  Laterality: N/A;   ESOPHAGOGASTRODUODENOSCOPY (EGD) WITH PROPOFOL  N/A 09/27/2019   Procedure: ESOPHAGOGASTRODUODENOSCOPY (EGD) WITH PROPOFOL ;  Surgeon: [name], Ladell POUR, MD;  Location: [facility] ENDOSCOPY;  Service: Gastroenterology;  Laterality: N/A;   FLEXIBLE SIGMOIDOSCOPY N/A 09/15/2023   Procedure: FLEXIBLE SIGMOIDOSCOPY;  Surgeon: [name], Ladell POUR, MD;  Location: [facility] ENDOSCOPY;  Service: Gastroenterology;  Laterality: N/A;  DM   HERNIA REPAIR Bilateral mid-90s   x2  inguinal   IVC FILTER INSERTION N/A  03/06/2024   Procedure: IVC FILTER INSERTION;  Surgeon: Marea Selinda RAMAN, MD;  Location: [facility] INVASIVE CV LAB;  Service: Cardiovascular;  Laterality: N/A;   IVC FILTER REMOVAL N/A 05/08/2024   Procedure: IVC FILTER REMOVAL;  Surgeon: Marea Selinda RAMAN, MD;  Location: [facility] CV [facility];  Service: Cardiovascular;  Laterality: N/A;   PARTIAL COLECTOMY  1998   POLYPECTOMY  09/15/2023   Procedure: POLYPECTOMY;  Surgeon: [name], Ladell POUR, MD;  Location: [facility] ENDOSCOPY;  Service: Gastroenterology;;   PROSTATE BIOPSY  2013   SURGERY SCROTAL / TESTICULAR     for fibrous growth   TOTAL HIP ARTHROPLASTY Left 03/14/2024   Procedure: ARTHROPLASTY, HIP, TOTAL,POSTERIOR APPROACH;  Surgeon: Edie Norleen PARAS, MD;  Location: [facility] ORS;  Service: Orthopedics;  Laterality: Left;   Patient Active Problem List   Diagnosis Date Noted   Status post total hip replacement, left 03/14/2024   Diastolic dysfunction    Diabetes mellitus type 2 with complications (HCC) 03/23/2018   Chest pain with moderate risk for cardiac etiology 11/29/2017   Positive cardiac stress test 11/29/2017   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 11/08/2017   Esophagitis, reflux 11/08/2017   SOB (shortness of breath) on exertion 07/02/2017   BPH with obstruction/lower urinary tract symptoms 11/21/2015   History of elevated PSA 11/21/2015   H/O gastric ulcer 09/05/2015   Disorder of eyeball 05/23/2015   H/O infectious disease 05/23/2015   Blood glucose elevated 05/23/2015   BP (high blood pressure) 05/23/2015   HLD (hyperlipidemia) 05/23/2015   Psoriasis 05/23/2015   Absolute anemia 01/02/2014   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   Elevated prostate specific antigen (PSA) 01/02/2014   Elevated blood uric acid level 01/02/2014   Enlarged prostate without lower urinary tract symptoms (luts) 01/02/2014   Bleeding gastrointestinal 04/15/2012    PCP: Auston Reyes BIRCH, MD  REFERRING PROVIDER: Kip Lynwood Double, PA-C  REFERRING DIAG: s/p  Left THA   THERAPY DIAG:  Stiffness of left hip, not elsewhere classified  Pain in left hip  Difficulty in walking, not elsewhere classified  Muscle weakness (generalized)  Unsteadiness on feet  Other lack of coordination  Rationale for Evaluation and Treatment: rehabilitation   ONSET DATE: August 2025   SUBJECTIVE:   SUBJECTIVE STATEMENT: Patient reports doing good today with no significant changes.  PERTINENT HISTORY: 83yoM referred to OPPT by orthopedics for ongoing difficulty walking, leg weakness, imbalance since Left THA in August 2025. Pt had a remarkable difficulty with LLE swelling since surgery. Pt saw HHPT 3x/week for 2 weeks. Pt has transitioned to [facility] for AMB outside of house or walker in house if he needs to  PAIN:  Are you having pain? None at rest    PRECAUTIONS: Posterior hip precautions  WEIGHT BEARING RESTRICTIONS: Yes, WBAT  FALLS:  Has patient fallen in last 6 months? No   LIVING ENVIRONMENT: Lives with: Wife  Lives in: House  Stairs: 4, 1 railing  Has following equipment at home: SPC, BSC, RW   OCCUPATION: retired   PATIENT GOALS: wants to be able to walk the dogs again without DME; wants to have less pain with car transfers.   OBJECTIVE:  Note: Objective measures were completed at Evaluation unless otherwise noted.  PATIENT SURVEYS:  LEFS: 47.5%  LOWER EXTREMITY MMT:  MMT Right eval Left eval  Hip flexion 5/5 5/5  Hip extension N/t N/t  Hip hor abduction 5/5 4+/5  Hip hor adduction 5/5 5/5  Hip internal rotation 5/5 5/5  Hip external rotation 5/5 4+/5*   (Blank rows = not tested)  LOWER EXTEMITY ROM:   Left hip extension ROM: -17deg Left knee extension: -20 deg  LOWER EXTREMITY SPECIAL TESTS:    FUNCTIONAL TESTS:  5x STS: 11.67sec, hands free, no pain   : 15.91sec SPC, RW: 13.15sec   : 451ft, 42m48s, pain over final 22ft left hip   GAIT: Distance walked: 473ft Assistive device utilized: Environmental Consultant - 2  wheeled Level of assistance: SBA Comments: pain in left hip in final 69ft  *abducted LLE, moresof with SPC use, 50% improved with RW                                                                                                                              TREATMENT DATE 08/10/24 :   -Gait 450' with rollator with cuing for safety with gait and upright posture.   -Sit<>stand holding 3 kg 2x10 from chair.   -Hip 3-way: 2x10 with 4 lb. AW.   -Tandem stance at balance station: 30''x2.   -Step-ups onto 6'' step: 2x10 each.   -Side-stepping in // bars x5 laps with 2.5 AW.   -Marching in // bars with 2.5# AW donned x30 total.    PATIENT EDUCATION:  Education details: Tarrence Person educated: update minisquats at home to full STS squats  Education method: collaborative learning, deliberate practice, positive reinforcement, explicit instruction, establish rules. Education comprehension: fair  HOME EXERCISE PROGRAM: Access Code: [email or web address] URL: [email or web address] Date: 07/18/2024 Prepared by: Peggye Linear  Exercises - Sit to Stand form HIGH surface  - 2 x daily - 1 sets - 10 reps - Marching at Counter with Minimal Hand Support   - 2 x daily - 1 sets - 30 reps - Side Stepping with Counter Support  - 2 x daily - 1 sets - 20 reps - Supine Bridge  - 2 x daily - 1 sets - 10 reps - Supine March  - 2 x daily - 1 sets - 20 reps - Standing Lumbar Extension with Counter  - 3 x daily - 7 x weekly - 1 sets - 25 reps  ASSESSMENT:  CLINICAL IMPRESSION:  Patient continued with focusing on improving gait, increasing LE  strength, and improving overall functional mobility. He continues to be highly motivated to improve throughout treatment session. LE strengthening performed using 4 lb. And 2.5lb. AW today for resistance. He also worked on various components of functional activities such as long distance gait, stepping up onto curb, and transfers from chair. Pt performed well  following verbal cues however LE weakness and decreased balance still noted throughout treatment session. Pt will benefit from continuing skilled PT at this time for further improvement toward goals.   OBJECTIVE IMPAIRMENTS: Decreased knowledge of condition, decreased use of DME, decreased mobility, difficulty walking, decreased strength, decreased ROM. ACTIVITY LIMITATIONS: Lifting, standing, walking, squatting, transfers, locomotion level PARTICIPATION LIMITATIONS: Cleaning, laundry, interpersonal relationships, driving, yardwork, community activity.  PERSONAL FACTORS: Age, behavior pattern, education, past/current experiences, transportation, profession  are also affecting patient's functional outcome.  REHAB POTENTIAL: Good CLINICAL DECISION MAKING: Medium  EVALUATION COMPLEXITY: Moderate    GOALS: Goals reviewed with patient? No  SHORT TERM GOALS: Target date: 07/28/24 MMT in BLE hips 5/5 pain free  Baseline: *see eval for detail, Left hip ER painful/weak  Goal status: INITIAL  2.  >937ft c RW to improve limited community distance moblity for independent exercise.  Baseline: 446ft c RW 08/08/2024: 975' with RW  Goal status: Met.  3.  Pt to demonstrate narrow stance on foam balance >30sec, eyes open to show improved Left hip proprioception/motor control.  Baseline: defer to visit 7  Goal status: INITIAL   LONG TERM GOALS: Target date: 08/28/24  Knowledge and confidence in a final DC HEP for conitnued improvements in strength and balance.  Baseline:  Goal status: INITIAL  2.  LEFS survey improvement >20% to indicated reduced disability.  Baseline: 47.5% 08/08/2024: 45% Goal status: INITIAL  3.  c SPC >1.51m/s  Baseline: 0.30m/s c SPC 08/08/2024: 0.74 m/s c SPC  Goal status: INITIAL  4.  >1549ft with DME PRN to improve energy conservation for community AMB.  Baseline: 462ft c RW  08/08/2024: 975' c RW.  Goal status: INITIAL   PLAN:  PT FREQUENCY:  1-2x/week  PT DURATION: 12 weeks  PLANNED INTERVENTIONS: 97110-Therapeutic exercises, 97530- Therapeutic activity, 97112- Neuromuscular re-education, 97535- Self Care, [id number]- Manual therapy, [id number]- Electrical stimulation (unattended), [phone]- Electrical stimulation (manual), Patient/Family education, Balance training, Stair training, Joint mobilization, Joint manipulation, DME instructions, Cryotherapy, and Moist heat  PLAN FOR NEXT SESSION:   Continue working to improve gait speed/distance. Continue working on tourist information centre manager with AD.  Continue working to improve L hip strength with various progressive resistive/functional exercises.  Norman Sharps, PT, DPT Physical Therapist - [facility]  [phone]    4:37 PM 08/10/24     "

## 2024-08-14 ENCOUNTER — Ambulatory Visit

## 2024-08-14 ENCOUNTER — Ambulatory Visit
Admission: RE | Admit: 2024-08-14 | Discharge: 2024-08-14 | Disposition: A | Source: Ambulatory Visit | Attending: Student

## 2024-08-14 DIAGNOSIS — M1712 Unilateral primary osteoarthritis, left knee: Secondary | ICD-10-CM | POA: Diagnosis present

## 2024-08-15 ENCOUNTER — Ambulatory Visit

## 2024-08-15 DIAGNOSIS — M25552 Pain in left hip: Secondary | ICD-10-CM

## 2024-08-15 DIAGNOSIS — M6281 Muscle weakness (generalized): Secondary | ICD-10-CM

## 2024-08-15 DIAGNOSIS — M25652 Stiffness of left hip, not elsewhere classified: Secondary | ICD-10-CM

## 2024-08-15 DIAGNOSIS — R262 Difficulty in walking, not elsewhere classified: Secondary | ICD-10-CM

## 2024-08-15 NOTE — Therapy (Signed)
 " OUTPATIENT PHYSICAL THERAPY TREATMENT   Patient Name: Brian Callahan MRN: [id number] DOB:26-Nov-1940, 84 y.o., male Today's Date: 08/15/2024  END OF SESSION:  PT End of Session - 08/15/24 1542     Visit Number 12    Number of Visits 24    Date for Recertification  09/19/24    Authorization Type [employer] Medicare    Authorization Time Period 06/27/24-09/19/2024    Progress Note Due on Visit 10    PT Start Time 1539    PT Stop Time 1617    PT Time Calculation (min) 38 min    Equipment Utilized During Treatment Gait belt    Activity Tolerance Patient tolerated treatment well;No increased pain;Patient limited by fatigue    Behavior During Therapy [facility] for tasks assessed/performed            Past Medical History:  Diagnosis Date   Anemia    Aortic atherosclerosis    Aortic insufficiency    a. 06/2017 Echo: EF 50% with mild LVH, nl RV function, moderate AI, and mild MR/TR/PR.   Aortic root dilatation    Benign fibroma of prostate    BPH with obstruction/lower urinary tract symptoms    Cerebellar infarct (HCC)    Cerebral microvascular disease    Chest pain    a. 2010 Cath ([facility]): reportedly nl; b. 06/2017 [facility]): EF 63%, mild inf wall ischemia->med rx.   Chronic dyspnea    Chronic GI bleeding    Colon polyps    COPD (chronic obstructive pulmonary disease) (HCC)    Depression    Diaphragm paralysis (RIGHT)    Diastolic dysfunction    Diverticulosis    DM (diabetes mellitus), type 2 (HCC)    Elevated PSA    Embolus of mesenteric artery    Gastritis    GERD (gastroesophageal reflux disease)    Gout    H/O hernia repair mid-90s   x2   History of prostate cancer s/p IMRT    History of [place] spotted fever    HLD (hyperlipidemia)    HTN (hypertension)    Incomplete bladder emptying    Insomnia    a.) takes melatonin PRN   Long term current use of immunosuppressive drug    a.) IL-17A for psoriatic arthritis (secukinumab)   Macular degeneration    Obesity     Osteoarthritis of hip    Psoriasis    Psoriatic arthritis (HCC)    a.) on IL-17A inhibitor (secukinumab)   Pulmonary hypertension (HCC)    S/P insertion of IVC (inferior vena caval) filter    SBO (small bowel obstruction); s/p partial colectomy 1998   Status post bilateral cataract extraction    Urinary frequency    Past Surgical History:  Procedure Laterality Date   APPENDECTOMY     BACK SURGERY     neck and spine 2007   BIOPSY  09/15/2023   Procedure: BIOPSY;  Surgeon: Aundria, Ladell POUR, MD;  Location: [facility] ENDOSCOPY;  Service: Gastroenterology;;   CATARACT EXTRACTION, BILATERAL Bilateral    CERVICAL SPINE SURGERY  2007   CHOLECYSTECTOMY N/A 10/18/2017   Procedure: LAPAROSCOPIC CHOLECYSTECTOMY;  Surgeon: Rodolph Romano, MD;  Location: [facility] ORS;  Service: General;  Laterality: N/A;   COLONOSCOPY     lost 10 units of blood   COLONOSCOPY WITH PROPOFOL  N/A 09/27/2019   Procedure: COLONOSCOPY WITH PROPOFOL ;  Surgeon: [name], Ladell POUR, MD;  Location: [facility] ENDOSCOPY;  Service: Gastroenterology;  Laterality: N/A;   CYSTOSCOPY WITH INSERTION OF UROLIFT  N/A 01/23/2020   Procedure: CYSTOSCOPY WITH INSERTION OF UROLIFT;  Surgeon: Twylla Glendia BROCKS, MD;  Location: [facility] ORS;  Service: Urology;  Laterality: N/A;   ESOPHAGOGASTRODUODENOSCOPY N/A 10/14/2016   Procedure: ESOPHAGOGASTRODUODENOSCOPY (EGD);  Surgeon: Lamar ONEIDA Holmes, MD;  Location: [facility] ENDOSCOPY;  Service: Endoscopy;  Laterality: N/A;   ESOPHAGOGASTRODUODENOSCOPY (EGD) WITH PROPOFOL  N/A 09/27/2019   Procedure: ESOPHAGOGASTRODUODENOSCOPY (EGD) WITH PROPOFOL ;  Surgeon: [name], Ladell POUR, MD;  Location: [facility] ENDOSCOPY;  Service: Gastroenterology;  Laterality: N/A;   FLEXIBLE SIGMOIDOSCOPY N/A 09/15/2023   Procedure: FLEXIBLE SIGMOIDOSCOPY;  Surgeon: [name], Ladell POUR, MD;  Location: [facility] ENDOSCOPY;  Service: Gastroenterology;  Laterality: N/A;  DM   HERNIA REPAIR Bilateral mid-90s   x2  inguinal   IVC FILTER INSERTION N/A  03/06/2024   Procedure: IVC FILTER INSERTION;  Surgeon: Marea Selinda RAMAN, MD;  Location: [facility] INVASIVE CV LAB;  Service: Cardiovascular;  Laterality: N/A;   IVC FILTER REMOVAL N/A 05/08/2024   Procedure: IVC FILTER REMOVAL;  Surgeon: Marea Selinda RAMAN, MD;  Location: [facility] INVASIVE CV LAB;  Service: Cardiovascular;  Laterality: N/A;   PARTIAL COLECTOMY  1998   POLYPECTOMY  09/15/2023   Procedure: POLYPECTOMY;  Surgeon: [name], Ladell POUR, MD;  Location: [facility] ENDOSCOPY;  Service: Gastroenterology;;   PROSTATE BIOPSY  2013   SURGERY SCROTAL / TESTICULAR     for fibrous growth   TOTAL HIP ARTHROPLASTY Left 03/14/2024   Procedure: ARTHROPLASTY, HIP, TOTAL,POSTERIOR APPROACH;  Surgeon: Edie Norleen PARAS, MD;  Location: [facility] ORS;  Service: Orthopedics;  Laterality: Left;   Patient Active Problem List   Diagnosis Date Noted   Status post total hip replacement, left 03/14/2024   Diastolic dysfunction    Diabetes mellitus type 2 with complications (HCC) 03/23/2018   Chest pain with moderate risk for cardiac etiology 11/29/2017   Positive cardiac stress test 11/29/2017   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 11/08/2017   Esophagitis, reflux 11/08/2017   SOB (shortness of breath) on exertion 07/02/2017   BPH with obstruction/lower urinary tract symptoms 11/21/2015   History of elevated PSA 11/21/2015   H/O gastric ulcer 09/05/2015   Disorder of eyeball 05/23/2015   H/O infectious disease 05/23/2015   Blood glucose elevated 05/23/2015   BP (high blood pressure) 05/23/2015   HLD (hyperlipidemia) 05/23/2015   Psoriasis 05/23/2015   Absolute anemia 01/02/2014   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   Elevated prostate specific antigen (PSA) 01/02/2014   Elevated blood uric acid level 01/02/2014   Enlarged prostate without lower urinary tract symptoms (luts) 01/02/2014   Bleeding gastrointestinal 04/15/2012    PCP: Auston Reyes BIRCH, MD  REFERRING PROVIDER: Kip Lynwood Double, PA-C  REFERRING DIAG: s/p  Left THA   THERAPY DIAG:  Stiffness of left hip, not elsewhere classified  Pain in left hip  Difficulty in walking, not elsewhere classified  Muscle weakness (generalized)  Rationale for Evaluation and Treatment: rehabilitation   ONSET DATE: August 2025   SUBJECTIVE:   SUBJECTIVE STATEMENT: Pt says he had MRI for knee yesterday. No pain today. Pt reports no pain on arrival.   PERTINENT HISTORY: 83yoM referred to OPPT by orthopedics for ongoing difficulty walking, leg weakness, imbalance since Left THA in August 2025. Pt had a remarkable difficulty with LLE swelling since surgery. Pt saw HHPT 3x/week for 2 weeks. Pt has transitioned to [facility] for AMB outside of house or walker in house if he needs to  PAIN:  Are you having pain? None at rest    PRECAUTIONS: Posterior hip precautions  WEIGHT BEARING RESTRICTIONS: Yes, WBAT  FALLS:  Has patient fallen in last 6 months? No   LIVING ENVIRONMENT: Lives with: Wife  Lives in: House  Stairs: 4, 1 railing  Has following equipment at home: SPC, BSC, RW   OCCUPATION: retired   PATIENT GOALS: wants to be able to walk the dogs again without DME; wants to have less pain with car transfers.   OBJECTIVE:  Note: Objective measures were completed at Evaluation unless otherwise noted.  PATIENT SURVEYS:  LEFS: 47.5%  LOWER EXTREMITY MMT:  MMT Right eval Left eval  Hip flexion 5/5 5/5  Hip extension N/t N/t  Hip hor abduction 5/5 4+/5  Hip hor adduction 5/5 5/5  Hip internal rotation 5/5 5/5  Hip external rotation 5/5 4+/5*   (Blank rows = not tested)  LOWER EXTEMITY ROM:  Left hip extension ROM: -17deg Left knee extension: -20 deg  FUNCTIONAL TESTS:  5x STS: 11.67sec, hands free, no pain   : 15.91sec SPC, RW: 13.15sec   : 468ft, 57m48s, pain over final 81ft left hip   GAIT: Distance walked: 410ft Assistive device utilized: Environmental Consultant - 2 wheeled Level of assistance: SBA Comments: pain in left hip in final 105ft   *abducted LLE, moresof with SPC use, 50% improved with RW                                                                                                                              TREATMENT DATE 08/15/24 :  -AMB overground 473ft c 4WW, 12m 41s *seated recovery  -AMB overground c 3lb AW bilat, 428ft c 4WW 62m 8sec  *seated recovery  *seated recovery  -AMB overground c 3lb AW bilat, 461ft c 4WW 58m 1sec  *seated recovery  -side stepping at support bar x90sec  *seated recovery  -seminarrow stance on airex pad with basketball toss/catch x90sec     PATIENT EDUCATION:  Education details: [place] Person educated: update minisquats at home to full STS squats  Education method: collaborative learning, deliberate practice, positive reinforcement, explicit instruction, establish rules. Education comprehension: fair  HOME EXERCISE PROGRAM: Access Code: [email or web address] URL: [email or web address] Date: 07/18/2024 Prepared by: Peggye Linear  Exercises - Sit to Stand form HIGH surface  - 2 x daily - 1 sets - 10 reps - Marching at Counter with Minimal Hand Support   - 2 x daily - 1 sets - 30 reps - Side Stepping with Counter Support  - 2 x daily - 1 sets - 20 reps - Supine Bridge  - 2 x daily - 1 sets - 10 reps - Supine March  - 2 x daily - 1 sets - 20 reps - Standing Lumbar Extension with Counter  - 3 x daily - 7 x weekly - 1 sets - 25 reps  ASSESSMENT:  CLINICAL IMPRESSION: Continued with POC. Focus on longer distance walking intervals with resistance. Utilized [phone] for improve symetry of gait. FInished with static balance on airex. Reocvery breaks  taken as needed. No major pain limitations in session today, just fatigue with longer performance. Patient continued with focusing on improving gait, increasing LE strength, and improving overall functional mobility. He continues to be highly motivated to improve throughout treatment session. LE strengthening performed using 4 lb. And 2.5lb.  AW today for resistance. He also worked on various components of functional activities such as long distance gait, stepping up onto curb, and transfers from chair. Pt performed well following verbal cues however LE weakness and decreased balance still noted throughout treatment session. Pt will benefit from continuing skilled PT at this time for further improvement toward goals.   OBJECTIVE IMPAIRMENTS: Decreased knowledge of condition, decreased use of DME, decreased mobility, difficulty walking, decreased strength, decreased ROM. ACTIVITY LIMITATIONS: Lifting, standing, walking, squatting, transfers, locomotion level PARTICIPATION LIMITATIONS: Cleaning, laundry, interpersonal relationships, driving, yardwork, community activity.  PERSONAL FACTORS: Age, behavior pattern, education, past/current experiences, transportation, profession  are also affecting patient's functional outcome.  REHAB POTENTIAL: Good CLINICAL DECISION MAKING: Medium  EVALUATION COMPLEXITY: Moderate    GOALS: Goals reviewed with patient? No  SHORT TERM GOALS: Target date: 07/28/24 MMT in BLE hips 5/5 pain free  Baseline: *see eval for detail, Left hip ER painful/weak  Goal status: INITIAL  2.  >929ft c RW to improve limited community distance moblity for independent exercise.  Baseline: 433ft c RW 08/08/2024: 975' with RW  Goal status: Met.  3.  Pt to demonstrate narrow stance on foam balance >30sec, eyes open to show improved Left hip proprioception/motor control.  Baseline: defer to visit 7  Goal status: INITIAL   LONG TERM GOALS: Target date: 08/28/24  Knowledge and confidence in a final DC HEP for conitnued improvements in strength and balance.  Baseline:  Goal status: INITIAL  2.  LEFS survey improvement >20% to indicated reduced disability.  Baseline: 47.5% 08/08/2024: 45% Goal status: INITIAL  3.  c SPC >1.28m/s  Baseline: 0.57m/s c SPC 08/08/2024: 0.74 m/s c SPC  Goal status:  INITIAL  4.  >1560ft with DME PRN to improve energy conservation for community AMB.  Baseline: 410ft c RW  08/08/2024: 975' c RW.  Goal status: INITIAL  PLAN:  PT FREQUENCY: 1-2x/week  PT DURATION: 12 weeks  PLANNED INTERVENTIONS: 97110-Therapeutic exercises, 97530- Therapeutic activity, 97112- Neuromuscular re-education, 97535- Self Care, [id number]- Manual therapy, [id number]- Electrical stimulation (unattended), [phone]- Electrical stimulation (manual), Patient/Family education, Balance training, Stair training, Joint mobilization, Joint manipulation, DME instructions, Cryotherapy, and Moist heat  PLAN FOR NEXT SESSION:  Continue working to improve gait speed/distance. Continue working on tourist information centre manager with AD.  Continue working to improve L hip strength with various progressive resistive/functional exercises.  3:49 PM, 08/15/24 Peggye JAYSON Linear, PT, DPT Physical Therapist - [facility]  [phone] [facility])     "

## 2024-08-16 ENCOUNTER — Ambulatory Visit

## 2024-08-18 ENCOUNTER — Ambulatory Visit: Admitting: Physical Therapy

## 2024-08-21 ENCOUNTER — Ambulatory Visit

## 2024-08-23 ENCOUNTER — Ambulatory Visit

## 2024-08-28 ENCOUNTER — Ambulatory Visit

## 2024-08-30 ENCOUNTER — Ambulatory Visit

## 2024-08-30 ENCOUNTER — Telehealth: Payer: Self-pay | Admitting: Physical Therapy

## 2024-08-30 NOTE — Telephone Encounter (Signed)
 Pt contacted via telephone and author l spoke with patient informing of missed appointment and informed pt of future PT appointment date and time.  Pt states that he will call if he is unable to make it.     Massie Dollar PT, DPT  Physical Therapist - [place]  [facility]  1:59 PM 08/30/24

## 2024-09-04 ENCOUNTER — Ambulatory Visit

## 2024-09-06 ENCOUNTER — Ambulatory Visit: Admitting: Physical Therapy

## 2024-09-08 ENCOUNTER — Ambulatory Visit: Admitting: Physical Therapy

## 2024-09-11 ENCOUNTER — Ambulatory Visit

## 2024-09-13 ENCOUNTER — Ambulatory Visit

## 2025-05-10 ENCOUNTER — Inpatient Hospital Stay

## 2025-05-17 ENCOUNTER — Ambulatory Visit: Admitting: Radiation Oncology
